# Patient Record
Sex: Female | Born: 1945
Health system: Southern US, Community
[De-identification: ages and names within clinical notes are randomized; demographics above are authoritative.]

## PROBLEM LIST (undated history)

## (undated) DIAGNOSIS — I428 Other cardiomyopathies: Secondary | ICD-10-CM

## (undated) DIAGNOSIS — G4730 Sleep apnea, unspecified: Secondary | ICD-10-CM

## (undated) DIAGNOSIS — K219 Gastro-esophageal reflux disease without esophagitis: Secondary | ICD-10-CM

## (undated) DIAGNOSIS — E1165 Type 2 diabetes mellitus with hyperglycemia: Secondary | ICD-10-CM

## (undated) DIAGNOSIS — I639 Cerebral infarction, unspecified: Secondary | ICD-10-CM

## (undated) DIAGNOSIS — I10 Essential (primary) hypertension: Secondary | ICD-10-CM

## (undated) DIAGNOSIS — I5022 Chronic systolic (congestive) heart failure: Secondary | ICD-10-CM

## (undated) DIAGNOSIS — IMO0001 Reserved for inherently not codable concepts without codable children: Secondary | ICD-10-CM

## (undated) DIAGNOSIS — Z87442 Personal history of urinary calculi: Secondary | ICD-10-CM

## (undated) DIAGNOSIS — R51 Headache: Secondary | ICD-10-CM

## (undated) HISTORY — DX: Essential (primary) hypertension: I10

## (undated) HISTORY — DX: Cerebral infarction, unspecified: I63.9

## (undated) HISTORY — DX: Personal history of urinary calculi: Z87.442

## (undated) HISTORY — PX: ABDOMINAL HYSTERECTOMY: SHX81

## (undated) HISTORY — DX: Headache: R51

## (undated) HISTORY — PX: OOPHORECTOMY: SHX86

## (undated) HISTORY — DX: Reserved for inherently not codable concepts without codable children: IMO0001

## (undated) HISTORY — DX: Chronic systolic (congestive) heart failure: I50.22

## (undated) HISTORY — DX: Type 2 diabetes mellitus with hyperglycemia: E11.65

## (undated) HISTORY — PX: CATARACT EXTRACTION: SUR2

## (undated) HISTORY — DX: Gastro-esophageal reflux disease without esophagitis: K21.9

## (undated) HISTORY — PX: OTHER SURGICAL HISTORY: SHX169

## (undated) HISTORY — DX: Other cardiomyopathies: I42.8

---

## 1998-07-13 ENCOUNTER — Emergency Department (HOSPITAL_COMMUNITY): Admission: EM | Admit: 1998-07-13 | Discharge: 1998-07-13 | Payer: Self-pay | Admitting: Emergency Medicine

## 1998-07-13 ENCOUNTER — Encounter: Payer: Self-pay | Admitting: Emergency Medicine

## 1999-04-14 ENCOUNTER — Ambulatory Visit (HOSPITAL_COMMUNITY): Admission: RE | Admit: 1999-04-14 | Discharge: 1999-04-14 | Payer: Self-pay | Admitting: Cardiology

## 2001-03-31 ENCOUNTER — Encounter: Payer: Self-pay | Admitting: *Deleted

## 2001-03-31 ENCOUNTER — Emergency Department (HOSPITAL_COMMUNITY): Admission: EM | Admit: 2001-03-31 | Discharge: 2001-03-31 | Payer: Self-pay | Admitting: *Deleted

## 2001-09-02 ENCOUNTER — Encounter: Payer: Self-pay | Admitting: Family Medicine

## 2001-09-02 ENCOUNTER — Ambulatory Visit (HOSPITAL_COMMUNITY): Admission: RE | Admit: 2001-09-02 | Discharge: 2001-09-02 | Payer: Self-pay | Admitting: Family Medicine

## 2001-10-03 ENCOUNTER — Ambulatory Visit (HOSPITAL_COMMUNITY): Admission: RE | Admit: 2001-10-03 | Discharge: 2001-10-03 | Payer: Self-pay | Admitting: Family Medicine

## 2001-10-03 ENCOUNTER — Encounter: Payer: Self-pay | Admitting: Family Medicine

## 2001-10-04 ENCOUNTER — Ambulatory Visit (HOSPITAL_COMMUNITY): Admission: RE | Admit: 2001-10-04 | Discharge: 2001-10-04 | Payer: Self-pay | Admitting: Urology

## 2001-10-04 ENCOUNTER — Encounter: Payer: Self-pay | Admitting: Urology

## 2001-11-06 ENCOUNTER — Encounter: Admission: RE | Admit: 2001-11-06 | Discharge: 2001-11-06 | Payer: Self-pay | Admitting: Urology

## 2001-11-06 ENCOUNTER — Encounter: Payer: Self-pay | Admitting: Urology

## 2002-06-05 ENCOUNTER — Ambulatory Visit (HOSPITAL_COMMUNITY): Admission: RE | Admit: 2002-06-05 | Discharge: 2002-06-05 | Payer: Self-pay | Admitting: Pulmonary Disease

## 2003-08-23 ENCOUNTER — Emergency Department (HOSPITAL_COMMUNITY): Admission: EM | Admit: 2003-08-23 | Discharge: 2003-08-23 | Payer: Self-pay | Admitting: Emergency Medicine

## 2003-09-17 ENCOUNTER — Ambulatory Visit (HOSPITAL_COMMUNITY): Admission: RE | Admit: 2003-09-17 | Discharge: 2003-09-17 | Payer: Self-pay | Admitting: Pulmonary Disease

## 2004-04-15 ENCOUNTER — Ambulatory Visit: Payer: Self-pay | Admitting: Internal Medicine

## 2004-04-26 ENCOUNTER — Ambulatory Visit: Payer: Self-pay | Admitting: Internal Medicine

## 2004-05-03 ENCOUNTER — Encounter: Admission: RE | Admit: 2004-05-03 | Discharge: 2004-05-03 | Payer: Self-pay | Admitting: Internal Medicine

## 2004-05-03 LAB — HM MAMMOGRAPHY: HM Mammogram: NORMAL

## 2004-05-06 ENCOUNTER — Ambulatory Visit (HOSPITAL_BASED_OUTPATIENT_CLINIC_OR_DEPARTMENT_OTHER): Admission: RE | Admit: 2004-05-06 | Discharge: 2004-05-06 | Payer: Self-pay | Admitting: Internal Medicine

## 2004-05-23 ENCOUNTER — Ambulatory Visit: Payer: Self-pay | Admitting: Internal Medicine

## 2004-05-25 ENCOUNTER — Ambulatory Visit: Payer: Self-pay

## 2004-05-27 ENCOUNTER — Ambulatory Visit: Payer: Self-pay | Admitting: Internal Medicine

## 2004-05-30 ENCOUNTER — Ambulatory Visit: Payer: Self-pay | Admitting: Internal Medicine

## 2004-05-31 ENCOUNTER — Ambulatory Visit: Payer: Self-pay | Admitting: Cardiology

## 2004-05-31 ENCOUNTER — Inpatient Hospital Stay (HOSPITAL_BASED_OUTPATIENT_CLINIC_OR_DEPARTMENT_OTHER): Admission: RE | Admit: 2004-05-31 | Discharge: 2004-05-31 | Payer: Self-pay | Admitting: Cardiology

## 2004-06-07 ENCOUNTER — Ambulatory Visit: Payer: Self-pay | Admitting: Internal Medicine

## 2004-06-13 ENCOUNTER — Ambulatory Visit: Payer: Self-pay | Admitting: Pulmonary Disease

## 2004-12-08 ENCOUNTER — Inpatient Hospital Stay (HOSPITAL_COMMUNITY): Admission: RE | Admit: 2004-12-08 | Discharge: 2004-12-12 | Payer: Self-pay | Admitting: Orthopaedic Surgery

## 2004-12-08 ENCOUNTER — Ambulatory Visit: Payer: Self-pay | Admitting: Physical Medicine & Rehabilitation

## 2005-01-09 ENCOUNTER — Ambulatory Visit: Payer: Self-pay | Admitting: Internal Medicine

## 2007-11-12 ENCOUNTER — Ambulatory Visit: Payer: Self-pay | Admitting: Internal Medicine

## 2007-11-12 ENCOUNTER — Telehealth: Payer: Self-pay | Admitting: Internal Medicine

## 2007-11-12 DIAGNOSIS — R519 Headache, unspecified: Secondary | ICD-10-CM | POA: Insufficient documentation

## 2007-11-12 DIAGNOSIS — Z87442 Personal history of urinary calculi: Secondary | ICD-10-CM | POA: Insufficient documentation

## 2007-11-12 DIAGNOSIS — K219 Gastro-esophageal reflux disease without esophagitis: Secondary | ICD-10-CM | POA: Insufficient documentation

## 2007-11-12 DIAGNOSIS — I10 Essential (primary) hypertension: Secondary | ICD-10-CM | POA: Insufficient documentation

## 2007-11-12 DIAGNOSIS — R51 Headache: Secondary | ICD-10-CM | POA: Insufficient documentation

## 2007-11-12 DIAGNOSIS — J45909 Unspecified asthma, uncomplicated: Secondary | ICD-10-CM | POA: Insufficient documentation

## 2010-02-28 ENCOUNTER — Ambulatory Visit: Payer: Self-pay | Admitting: Internal Medicine

## 2010-04-24 ENCOUNTER — Inpatient Hospital Stay (HOSPITAL_COMMUNITY)
Admission: EM | Admit: 2010-04-24 | Discharge: 2010-04-25 | Payer: Self-pay | Source: Home / Self Care | Attending: Internal Medicine | Admitting: Internal Medicine

## 2010-04-25 ENCOUNTER — Encounter: Payer: Self-pay | Admitting: Internal Medicine

## 2010-04-25 LAB — DIFFERENTIAL
Basophils Absolute: 0 10*3/uL (ref 0.0–0.1)
Basophils Relative: 0 % (ref 0–1)
Eosinophils Absolute: 0.2 10*3/uL (ref 0.0–0.7)
Eosinophils Relative: 2 % (ref 0–5)
Lymphocytes Relative: 31 % (ref 12–46)
Lymphs Abs: 3.1 10*3/uL (ref 0.7–4.0)
Monocytes Absolute: 1 10*3/uL (ref 0.1–1.0)
Monocytes Relative: 10 % (ref 3–12)
Neutro Abs: 5.7 10*3/uL (ref 1.7–7.7)
Neutrophils Relative %: 57 % (ref 43–77)

## 2010-04-25 LAB — URINE MICROSCOPIC-ADD ON

## 2010-04-25 LAB — BASIC METABOLIC PANEL
BUN: 10 mg/dL (ref 6–23)
CO2: 27 mEq/L (ref 19–32)
Calcium: 9.4 mg/dL (ref 8.4–10.5)
Chloride: 104 mEq/L (ref 96–112)
Creatinine, Ser: 0.75 mg/dL (ref 0.4–1.2)
GFR calc Af Amer: >60 mL/min (ref >60)
GFR calc non Af Amer: >60 mL/min (ref >60)
Glucose, Bld: 156 mg/dL — ABNORMAL HIGH (ref 70–99)
Potassium: 3.2 mEq/L — ABNORMAL LOW (ref 3.5–5.1)
Sodium: 139 mEq/L (ref 135–145)

## 2010-04-25 LAB — CBC
HCT: 41.4 % (ref 36.0–46.0)
Hemoglobin: 14.3 g/dL (ref 12.0–15.0)
MCH: 30.2 pg (ref 26.0–34.0)
MCHC: 34.5 g/dL (ref 30.0–36.0)
MCV: 87.3 fL (ref 78.0–100.0)
Platelets: 225 10*3/uL (ref 150–400)
RBC: 4.74 MIL/uL (ref 3.87–5.11)
RDW: 12.9 % (ref 11.5–15.5)
WBC: 10.1 10*3/uL (ref 4.0–10.5)

## 2010-04-25 LAB — URINALYSIS, ROUTINE W REFLEX MICROSCOPIC
Bilirubin Urine: NEGATIVE
Ketones, ur: NEGATIVE mg/dL
Nitrite: NEGATIVE
Protein, ur: 30 mg/dL — AB
Specific Gravity, Urine: 1.02 (ref 1.005–1.030)
Urine Glucose, Fasting: 100 mg/dL — AB
Urobilinogen, UA: 0.2 mg/dL (ref 0.0–1.0)
pH: 6 (ref 5.0–8.0)

## 2010-04-27 LAB — COMPREHENSIVE METABOLIC PANEL
ALT: 24 U/L (ref 0–35)
AST: 25 U/L (ref 0–37)
Albumin: 3.3 g/dL — ABNORMAL LOW (ref 3.5–5.2)
Alkaline Phosphatase: 54 U/L (ref 39–117)
BUN: 9 mg/dL (ref 6–23)
CO2: 26 mEq/L (ref 19–32)
Calcium: 8.7 mg/dL (ref 8.4–10.5)
Chloride: 103 mEq/L (ref 96–112)
Creatinine, Ser: 0.81 mg/dL (ref 0.4–1.2)
GFR calc Af Amer: >60 mL/min (ref >60)
GFR calc non Af Amer: >60 mL/min (ref >60)
Glucose, Bld: 178 mg/dL — ABNORMAL HIGH (ref 70–99)
Potassium: 3.3 mEq/L — ABNORMAL LOW (ref 3.5–5.1)
Sodium: 136 mEq/L (ref 135–145)
Total Bilirubin: 0.6 mg/dL (ref 0.3–1.2)
Total Protein: 5.6 g/dL — ABNORMAL LOW (ref 6.0–8.3)

## 2010-04-27 LAB — CBC
HCT: 38.8 % (ref 36.0–46.0)
Hemoglobin: 13.1 g/dL (ref 12.0–15.0)
MCH: 29.7 pg (ref 26.0–34.0)
MCHC: 33.8 g/dL (ref 30.0–36.0)
MCV: 88 fL (ref 78.0–100.0)
Platelets: 213 10*3/uL (ref 150–400)
RBC: 4.41 MIL/uL (ref 3.87–5.11)
RDW: 13 % (ref 11.5–15.5)
WBC: 9.4 10*3/uL (ref 4.0–10.5)

## 2010-04-27 LAB — LIPID PANEL
Cholesterol: 171 mg/dL (ref 0–200)
HDL: 33 mg/dL — ABNORMAL LOW (ref >39)
LDL Cholesterol: 83 mg/dL (ref 0–99)
Total CHOL/HDL Ratio: 5.2 RATIO
Triglycerides: 277 mg/dL — ABNORMAL HIGH (ref <150)
VLDL: 55 mg/dL — ABNORMAL HIGH (ref 0–40)

## 2010-04-27 LAB — CARDIAC PANEL(CRET KIN+CKTOT+MB+TROPI)
CK, MB: 1.2 ng/mL (ref 0.3–4.0)
CK, MB: 1.1 ng/mL (ref 0.3–4.0)
Relative Index: INVALID (ref 0.0–2.5)
Relative Index: INVALID (ref 0.0–2.5)
Total CK: 52 U/L (ref 7–177)
Total CK: 52 U/L (ref 7–177)
Troponin I: 0.01 ng/mL (ref 0.00–0.06)
Troponin I: 0.01 ng/mL (ref 0.00–0.06)

## 2010-04-27 LAB — HEMOGLOBIN A1C
Hgb A1c MFr Bld: 7.7 % — ABNORMAL HIGH (ref <5.7)
Mean Plasma Glucose: 174 mg/dL — ABNORMAL HIGH (ref <117)

## 2010-04-27 LAB — URINE CULTURE
Colony Count: >=100000
Culture  Setup Time: 201201160330

## 2010-04-27 LAB — HEPATIC FUNCTION PANEL
ALT: 23 U/L (ref 0–35)
AST: 24 U/L (ref 0–37)
Albumin: 3.5 g/dL (ref 3.5–5.2)
Alkaline Phosphatase: 55 U/L (ref 39–117)
Bilirubin, Direct: 0.1 mg/dL (ref 0.0–0.3)
Indirect Bilirubin: 0.7 mg/dL (ref 0.3–0.9)
Total Bilirubin: 0.8 mg/dL (ref 0.3–1.2)
Total Protein: 5.7 g/dL — ABNORMAL LOW (ref 6.0–8.3)

## 2010-04-27 LAB — GLUCOSE, CAPILLARY
Glucose-Capillary: 163 mg/dL — ABNORMAL HIGH (ref 70–99)
Glucose-Capillary: 297 mg/dL — ABNORMAL HIGH (ref 70–99)
Glucose-Capillary: 171 mg/dL — ABNORMAL HIGH (ref 70–99)

## 2010-04-27 LAB — CK TOTAL AND CKMB (NOT AT ARMC)
CK, MB: 1.4 ng/mL (ref 0.3–4.0)
Relative Index: INVALID (ref 0.0–2.5)
Total CK: 75 U/L (ref 7–177)

## 2010-04-27 LAB — LIPASE, BLOOD: Lipase: 20 U/L (ref 11–59)

## 2010-04-27 LAB — MAGNESIUM: Magnesium: 1.9 mg/dL (ref 1.5–2.5)

## 2010-04-27 LAB — TSH: TSH: 2.855 u[IU]/mL (ref 0.350–4.500)

## 2010-04-27 LAB — TROPONIN I: Troponin I: 0.04 ng/mL (ref 0.00–0.06)

## 2010-05-05 ENCOUNTER — Ambulatory Visit
Admission: RE | Admit: 2010-05-05 | Discharge: 2010-05-05 | Payer: Self-pay | Source: Home / Self Care | Attending: Internal Medicine | Admitting: Internal Medicine

## 2010-05-05 DIAGNOSIS — E1165 Type 2 diabetes mellitus with hyperglycemia: Secondary | ICD-10-CM | POA: Insufficient documentation

## 2010-05-05 DIAGNOSIS — IMO0002 Reserved for concepts with insufficient information to code with codable children: Secondary | ICD-10-CM | POA: Insufficient documentation

## 2010-05-05 DIAGNOSIS — G459 Transient cerebral ischemic attack, unspecified: Secondary | ICD-10-CM | POA: Insufficient documentation

## 2010-05-05 DIAGNOSIS — E119 Type 2 diabetes mellitus without complications: Secondary | ICD-10-CM | POA: Insufficient documentation

## 2010-05-05 DIAGNOSIS — E1169 Type 2 diabetes mellitus with other specified complication: Secondary | ICD-10-CM | POA: Insufficient documentation

## 2010-05-10 NOTE — Progress Notes (Signed)
Summary: Apt Today  Phone Note Call from Patient   Summary of Call: Pt c/o fever, body aches, sinus congestion, productive cough which began sunday. Has taken excedrin only. Daughter has also has had a fever & cough. Pt put on schedule for 1:50, flu procedures? Pt has lost her job & aware of self pay fee. Last seen 2006 Initial call taken by: Lamar Sprinkles,  November 12, 2007 9:07 AM  Follow-up for Phone Call        Yes...Marland Kitchenflu procedures. Follow-up by: Jacques Navy MD,  November 12, 2007 12:50 PM  Additional Follow-up for Phone Call Additional follow up Details #1::        Was unable to reach pt in time, Shanda Bumps took pt out of waiting room & gave mask Additional Follow-up by: Lamar Sprinkles,  November 12, 2007 1:47 PM

## 2010-05-10 NOTE — Miscellaneous (Signed)
Summary: Procedure consent  Procedure consent   Imported By: Lester Lamar 03/04/2010 08:42:55  _____________________________________________________________________  External Attachment:    Type:   Image     Comment:   External Document

## 2010-05-10 NOTE — Assessment & Plan Note (Signed)
Summary: evaluation for mole on back   Vital Signs:  Patient profile:   65 year old female Height:      64 inches Weight:      195 pounds BMI:     33.59 O2 Sat:      95 % on Room air Temp:     98.7 degrees F oral Pulse rate:   99 / minute BP sitting:   162 / 100  (left arm) Cuff size:   large  Vitals Entered By: Bill Salinas CMA (February 28, 2010 2:51 PM)  O2 Flow:  Room air CC: pt here for evaluation of several moles. She is mostly concerned with one in particular on her back/ ab   Primary Care Provider:  Jacques Navy MD  CC:  pt here for evaluation of several moles. She is mostly concerned with one in particular on her back/ ab.  History of Present Illness: Patient presents with a concern about a mole on her back. This has been present for some time but she thinks it may be getting larger. It does itch. It is located such that her brassier irritates the lesion.  Current Medications (verified): 1)  Proair Hfa 108 (90 Base) Mcg/act  Aers (Albuterol Sulfate) .... As Needed 2)  Lisinopril-Hydrochlorothiazide 20-12.5 Mg  Tabs (Lisinopril-Hydrochlorothiazide) .Marland Kitchen.. 1 By Mouth Once Daily  Allergies (verified): No Known Drug Allergies  Past History:  Past Medical History: Last updated: 11/12/2007 UCD x/ mumps childhood and adult asthma GERD Headache Hypertension Nephrolithiasis, hx of x 2 UTIs  Past Surgical History: Last updated: 11/12/2007 Oophorectomy-unilateral Hysterectomy ureteral extraction of kidney stone.   G2P2 FH reviewed for relevance, SH/Risk Factors reviewed for relevance  Review of Systems Derm:  Complains of itching and lesion(s); denies changes in color of skin, dryness, insect bite(s), and rash.  Physical Exam  General:  overweight white female in no distress. Eyes:  C&S clear Lungs:  normal respiratory effort.   Heart:  normal rate and regular rhythm.   Skin:  dark, raised lesion right of center mid back at level of  brassier.   Impression & Recommendations:  Problem # 1:  NEOPLASM OF UNCERTAIN BEHAVIOR OF SKIN (ICD-238.2)  shave biopsy of lesion of uncertain behavior that on removal was most c/w seborrheic keratosis.  Orders: Shave Skin Lesion 1.1-2.0 cm/trunk/arm/leg (16109)  Complete Medication List: 1)  Proair Hfa 108 (90 Base) Mcg/act Aers (Albuterol sulfate) .... As needed 2)  Lisinopril-hydrochlorothiazide 20-12.5 Mg Tabs (Lisinopril-hydrochlorothiazide) .Marland Kitchen.. 1 by mouth once daily   Orders Added: 1)  Est. Patient Level II [60454] 2)  Shave Skin Lesion 1.1-2.0 cm/trunk/arm/leg [09811]     Procedure Note  Mole Biopsy/Removal: The patient complains of irritation and changing mole. Indication: changing lesion  Procedure # 1: shave biopsy    Size (in cm): 2.0 x 2.0    Location: right back    Comment: lesion of uncertain behavior but most likely a growing seborrheic keratosis    Instrument used: derma blade    Anesthesia: 2% xylocain with epi    Closure: hyfrecator  Cleaned and prepped with: betadine Wound dressing: bandaid Additional Instructions: routine wound precautions.

## 2010-05-10 NOTE — Assessment & Plan Note (Signed)
Summary: fever/cough/bodyaches SD   Vital Signs:  Patient Profile:   65 Years Old Female Height:     64 inches Weight:      190 pounds BMI:     32.73 Temp:     99.3 degrees F oral Pulse rate:   106 / minute BP sitting:   178 / 100  (left arm) Cuff size:   large  Vitals Entered By: Zackery Barefoot CMA (November 12, 2007 1:53 PM)                 PCP:  Jacques Navy MD  Chief Complaint:  [name] nose/bodyaches/productive cough/cold sweats/S.O.B. x 3 days.  History of Present Illness: Started feeling bad Saturday night with cold symptoms. Sunday the symptoms got much worse with cough, chills, [place] with one episode of emesis, myalgia, felt feverish (no documented fever). [name] (daughter) was also sick but did not have muscle pain. Feeling a little better today. Has trouble breathing due to cough with deep inspiration.    Updated Prior Medication List: PROAIR HFA 108 (90 BASE) MCG/ACT  AERS (ALBUTEROL SULFATE) as needed  Current Allergies: No known allergies   Past Medical History:    UCD x/ mumps    childhood and adult asthma    GERD    Headache    Hypertension    Nephrolithiasis, hx of x 2    UTIs  Past Surgical History:    Oophorectomy-unilateral    Hysterectomy    ureteral extraction of kidney stone.            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    Lives alone -struggle $$   Risk Factors:  Tobacco use:  never Caffeine use:  0 drinks per day Alcohol use:  yes    Type:  wine, mixed drink    Drinks per day:  <1    Counseled to quit/cut down alcohol use:  no Exercise:  no Seatbelt use:  100 %  Mammogram History:     Date of Last Mammogram:  05/03/2004    Results:  Normal Bilateral    Review of Systems   The patient complains of headaches.  The patient denies anorexia, weight loss, weight gain, vision loss, decreased hearing, chest pain, syncope, dyspnea on exertion, peripheral edema, abdominal pain, severe indigestion/heartburn, muscle weakness, suspicious skin lesions, difficulty walking, abnormal bleeding, angioedema, and breast masses.     Physical Exam  General:     alert, well-developed, well-nourished, well-hydrated, and normal appearance.   Head:     normocephalic and no abnormalities observed.   Eyes:     pupils equal, pupils round, and corneas and lenses clear.   Ears:     R ear normal and L ear normal.   Nose:     no external deformity.   Mouth:     no oral lesions Neck:     supple, no masses, and no thyromegaly.   Lungs:     normal respiratory effort.  Some crackles, no wheezing, no increased work of breathing Heart:     normal rate and regular rhythm.   Abdomen:     obese Msk:  normal ROM, no joint tenderness, no joint swelling, no joint instability, and no crepitation.   Pulses:     R radial normal and R femoral normal.   Extremities:     trace left pedal edema.   Neurologic:     cranial nerves II-XII intact and gait normal.   Skin:     turgor normal, color normal, and no rashes.   Cervical Nodes:     no anterior cervical adenopathy and no posterior cervical adenopathy.   Psych:     Oriented X3, memory intact for recent and remote, normally interactive, and good eye contact.      Impression & Recommendations:  Problem # 1:  BRONCHITIS-ACUTE (ICD-466.0) patient with productive cough, wheezing and low grade fever. She also has myalgia. She may have H1N1 influenza. discussed with the patient: she is not in a high risk group, she is not having serious respiratory symptoms or high fever. She has been ill for two day.s  Plan Septra DS two times a day x 7 days; ProAir inhaler 2 puffs 4 times a day as needed; APAP; Ibuprofen. I do not think that tamiflu will  have significant value at this point. She is to watch for persistent fevers, or increased respiratory symptoms not relieved by treatment of broncitis. Should she develop these symptoms she is to call for an Rx for Tamiflu.  Her updated medication list for this problem includes:    Proair Hfa 108 (90 Base) Mcg/act Aers (Albuterol sulfate) .Marland Kitchen... As needed    Sulfamethoxazole-tmp Ds 800-160 Mg Tabs (Sulfamethoxazole-trimethoprim) .Marland Kitchen... 1 by mouth two times a day x 7 days for  bronchitis   Problem # 2:  HYPERTENSION (ICD-401.9) Resume treatment  Plan: lisinopril/hct 20/12.5 at [facility]. Her updated medication list for this problem includes:    Lisinopril-hydrochlorothiazide 20-12.5 Mg Tabs (Lisinopril-hydrochlorothiazide) .Marland Kitchen... 1 by mouth once daily   Problem # 3:  ASTHMA (ICD-493.90) She does have intermittent symptoms which she treats with combivent.  Plan: ProAir as needed.  Her updated medication list for this problem includes:    Proair Hfa 108 (90 Base) Mcg/act Aers (Albuterol sulfate) .Marland Kitchen... As needed   Problem # 4:  Preventive Health Care (ICD-V70.0) Needs mammogram - she is to check with [facility] department. Needs routine lab when she can afford it.   Complete Medication List: 1)  Proair Hfa 108 (90 Base) Mcg/act Aers (Albuterol sulfate) .... As needed 2)  Sulfamethoxazole-tmp Ds 800-160 Mg Tabs (Sulfamethoxazole-trimethoprim) .Marland Kitchen.. 1 by mouth two times a day x 7 days for  bronchitis 3)  Lisinopril-hydrochlorothiazide 20-12.5 Mg Tabs (Lisinopril-hydrochlorothiazide) .Marland Kitchen.. 1 by mouth once daily    Prescriptions: PROAIR HFA 108 (90 BASE) MCG/ACT  AERS (ALBUTEROL SULFATE) as needed  #1 x 3   Entered and Authorized by:   Jacques Navy MD   Signed by:   Jacques Navy MD on 11/12/2007   Method used:   Electronically sent to ...       [facility]  [address]       [phone]       [place], [place]  [address]       Ph: [phone]        Fax: [phone]   RxID:   [id number] LISINOPRIL-HYDROCHLOROTHIAZIDE 20-12.5 MG  TABS (LISINOPRIL-HYDROCHLOROTHIAZIDE) 1 by mouth once daily  #90 x 3   Entered and Authorized by:   Jacques Navy MD   Signed by:  Jacques Navy MD on 11/12/2007   Method used:   Electronically sent to ...       [facility]  [address]       [phone]       [place], [place]  [address]       Ph: [phone]       Fax: [phone]   RxID:   [id number] SULFAMETHOXAZOLE-TMP DS 800-160 MG  TABS (SULFAMETHOXAZOLE-TRIMETHOPRIM) 1 by mouth two times a day x 7 days for  bronchitis  #14 x 0   Entered and Authorized by:   Jacques Navy MD   Signed by:   Jacques Navy MD on 11/12/2007   Method used:   Electronically sent to ...       [facility]  [address]       [place], [place]  [address]       Ph: [phone]       Fax: [phone]   RxID:   [id number]  ]

## 2010-05-17 NOTE — Discharge Summary (Signed)
[name], [name]              ACCOUNT NO.:  [id number]  MEDICAL RECORD NO.:  [id number]          PATIENT TYPE:  INP  LOCATION:  2037                         FACILITY:  [facility]  PHYSICIAN:  Rosalyn Gess. Korra Christine, MD  DATE OF BIRTH:  May 12, 1945  DATE OF ADMISSION:  04/24/2010 DATE OF DISCHARGE:  04/25/2010                              DISCHARGE SUMMARY   REASON FOR ADMISSION:  [name] was admitted for dizziness and mental status change.  DISCHARGE DIAGNOSES: 1. Hypertension, now under control. 2. Mental status change, resolved.  CONSULTATIONS:  None.  PROCEDURES: 1. CT scan of the brain was performed on April 24, 2010 which showed     no intracranial hemorrhage or CT evidence of large acute infarct. 2. Chest x-ray, April 25, 2010, which showed stable, no     cardiopulmonary process. 3. MRI of the brain with preliminary reading of left maxillary dental     hardware artifact, no acute intracranial abnormality.  Incidental     left temporal lobe development of venous anomaly. 4. 2-D echo performed on April 25, 2010, the results are pending at     time of discharge dictation.  HISTORY OF PRESENT ILLNESS:  The patient is a 65 year old woman with known history of hypertension who stopped taking her lisinopril because of a bout of nausea and diarrhea over the last week.  The patient reports she had been having loose stools at least 2-3 times a day, but on the date of admission had not had any stooling for 2 days.  She has been having nausea not related to food, but when she eats it does increase her nausea.  She has not been taking her blood pressure medication because of her symptoms.  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 She became confused and not herself.  She went to the second service and at that time the confusion was worse and she had difficulty ambulating and was taken home by her church  friends.  While at the church she knows a local doctor who checks her at home and found her blood pressure was increasing.  She was advised to take an additional dose of lisinopril which she did take but had persistently elevated blood pressure.  She was subsequently brought to the hospital about 9:00 p.m.  The patient had an initial CT scan that was normal.  Presenting blood pressure was 202/94 which came down with IV labetalol and lisinopril.  The patient did complain of some mild chest pain intermittently during the day lasting only for a few seconds but it was pressure-like in nature with radiation to her jaw.  The patient was admitted for blood pressure observation, rule out MI, and to evaluate her near syncopal episodes.  Please see the H and P for past medical history, family history, and social history and admission physical exam.  HOSPITAL COURSE: 1. Hypertension.  The patient's blood was well-controlled with     resumption of her medication and on the day of discharge in the     a.m. her blood pressure was  134/82 and in the p.m. it was 112/75.     She reports that her confusion had cleared and that all of her     symptoms that brought her into the hospital had cleared. 2. Cardiovascular.  The patient had complained of intermittent chest     pain.  She had multiple cardiac enzymes.  The first CK was 75 with     a troponin of 0.04.  Second CK was 52 with a troponin of 0.01.     Third with a CK 52 and troponin of 0.01 effectively having ruled     out for MI with a normal EKG. 3. GI.  The patient with probable viral gastroenteritis.  She reports     she has had no diarrhea for several days.  She does have some mild     problems with dyspepsia.  The patient was not any medication for     dyspepsia at admission.  The patient did take a full dinner tonight     without complications or problems.  Plan:  The patient is to take     over-the-counter H2 blocker such as Pepcid or Tagamet  as needed. 4. Neuro.  The patient had a nonfocal examination at admission.  MRI     was unremarkable as noted.  Discharge examination revealed a normal     neuro exam.  DISCHARGE EXAMINATION:  VITAL SIGNS:  At 1900 hours, temperature was 97.8, blood pressure was 112/75, pulse was 95, respirations 20, and O2 sats 96% on room air. GENERAL APPEARANCE:  This is an overweight woman lying in bed in no acute distress. CHEST:  The patient is moving air well with no rales, wheezes, or rhonchi. CARDIOVASCULAR:  2+ radial pulse.  Precordium was quiet.  She had a regular rate and rhythm. ABDOMEN:  Nontender. NEURO:  The patient is awake and alert.  She is oriented to person, place, time, and context.  Cranial nerves II-XII were intact with normal facial symmetry and muscle movement.  Extraocular muscles were intact. Pupils equal, round, and reactive.  Motor strength was 5/5 throughout. Cerebellar:  The patient had no tremor.  No cogwheeling.  FINAL LABORATORY:  In addition to cardiac enzymes as noted above, TSH was normal at 2.855.  Hemoglobin A1c was mildly elevated 7.7% and will need followup in the office.  Magnesium was normal at 1.9.  Lipid profile with a cholesterol of 171, triglycerides 277, HDL was 33, and LDL was 83.  Lipase was normal at 20.  Urinalysis was significant for moderate leukocyte, large blood.  Microscopic exam with 7-10 wbc's per high-powered field and rare bacteria.  Metabolic panel on April 25, 2010 with sodium 136, potassium 3.3, chloride 103, CO2 of 26, BUN of 9, creatinine 0.8, glucose of 178, SGOT 25, SGPT 24, and total protein was 5.6.  DISPOSITION:  The patient is discharged home.  She will resume her prior medications including lisinopril 20 mg daily and aspirin 81 mg daily. The patient is to follow a no-sugar, low-carb diet.  The patient will be seen in the office in 7-10 days for followup, specifically to address her elevated blood sugar and elevated  A1c.  The patient's condition at time of discharge dictation is stable and improved.     Rosalyn Gess Jaimee Corum, MD     MEN/MEDQ  D:  04/25/2010  T:  04/26/2010  Job:  [id number]  Electronically Signed by Illene Regulus MD on 05/17/2010 07:23:38 AM

## 2010-05-18 NOTE — Assessment & Plan Note (Signed)
Summary: post hosp   Vital Signs:  Patient profile:   65 year old female Height:      64 inches Weight:      198 pounds BMI:     34.11 O2 Sat:      97 % on Room air Temp:     98.0 degrees F oral Pulse rate:   88 / minute BP sitting:   138 / 88  (left arm) Cuff size:   large  Vitals Entered By: Bill Salinas CMA (May 05, 2010 4:03 PM)  O2 Flow:  Room air CC: pt here for hosp f/u / ab   Primary Care Provider:  Jacques Navy MD  CC:  pt here for hosp f/u / ab.  History of Present Illness: Patient presents for BP follow -up and for hospital follow-up. She did have a 24 eval admission for transient neurolgic symptoms. No records availabe, i.e. H&P or D/C summary, in eChart. Reviewed labs and studies: nl CT brain, nl MRI/MRA brain, A1C 7.7%, lipids 171/33/83, TSH 2.8, Cmet normal, CBC normal, cardiac enzymes normal. She has not had any recurrent symptoms. At d/c she was started on ASA 325mg  daily.   Her labs were shared with her and she is informed that based on serum glucose levels and on A1C she is diabetic which is a new diagnosis for her.   Current Medications (verified): 1)  Proair Hfa 108 (90 Base) Mcg/act  Aers (Albuterol Sulfate) .... As Needed 2)  Lisinopril-Hydrochlorothiazide 20-12.5 Mg  Tabs (Lisinopril-Hydrochlorothiazide) .Marland Kitchen.. 1 By Mouth Once Daily 3)  Ciprofloxacin Hcl 250 Mg Tabs (Ciprofloxacin Hcl) .Marland Kitchen.. 1 Tablet Two Times A Day  Allergies (verified): No Known Drug Allergies  Past History:  Past Surgical History: Last updated: 06-Dec-2007 Oophorectomy-unilateral Hysterectomy ureteral extraction of kidney stone.   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 Lives alone -struggle $$  Past Medical  History: UCD x/ mumps DIAB W/O MENTION COMP TYPE II/UNS TYPE UNCNTRL (ICD-250.02) ASTHMA (ICD-493.90) NEPHROLITHIASIS, HX OF (ICD-V13.01) HYPERTENSION (ICD-401.9) HEADACHE (ICD-784.0) GERD (ICD-530.81)  Review of Systems       The patient complains of weight loss.  The patient denies anorexia, fever, vision loss, decreased hearing, chest pain, syncope, dyspnea on exertion, prolonged cough, hemoptysis, abdominal pain, severe indigestion/heartburn, incontinence, muscle weakness, difficulty walking, depression, abnormal bleeding, and enlarged lymph nodes.         increased appetite and thirst, some blurred vision, generalized weakness and malaise.   Physical Exam  General:  alert, well-developed, well-nourished, and normal appearance.   Head:  normocephalic and atraumatic.   Eyes:  pupils equal, pupils round, corneas and lenses clear, and no injection.   Neck:  supple.   Lungs:  normal respiratory effort and normal breath sounds.   Heart:  normal rate and regular rhythm.   Abdomen:  soft and normal bowel sounds.   Msk:  normal ROM.   Pulses:  2+ radial Neurologic:  alert & oriented X3, cranial nerves II-XII intact, and gait normal.   Skin:  turgor normal and color normal.   Psych:  Oriented X3, memory intact for recent and remote, normally interactive, and good eye contact.     Impression & Recommendations:  Problem # 1:  DIAB W/O MENTION COMP TYPE II/UNS TYPE UNCNTRL (ICD-250.02) New  diagnosis of diabetes. Reviewed the mechanism of disease with her. Discussed treatment options and that she will start with life-style managemnt: no sugar diet, reduced simple carbohydrates and exercise. Referred to [email or web address] for additional information. If after reviewing on line materials she needs help will refer to diabetes educator.  Plan - life-style management           f/u A1C in 3 months.   Her updated medication list for this problem includes:    Lisinopril-hydrochlorothiazide 20-12.5  Mg Tabs (Lisinopril-hydrochlorothiazide) .Marland Kitchen... 1 by mouth once daily  Problem # 2:  HYPERTENSION (ICD-401.9)  Her updated medication list for this problem includes:    Lisinopril-hydrochlorothiazide 20-12.5 Mg Tabs (Lisinopril-hydrochlorothiazide) .Marland Kitchen... 1 by mouth once daily  BP today: 138/88 Prior BP: 162/100 (02/28/2010)  Much better control. Labs in hospital with normal renal function and lytes.  Plan - continue present medications.   Problem # 3:  TRANSIENT ISCHEMIC ATTACK (ICD-435.9) 24 hr observation admission for TIA - all studies negative.  Plan - risk modification, i.e. controll of BP. LDL is at goal           ASA 325mg  daily.  Complete Medication List: 1)  Proair Hfa 108 (90 Base) Mcg/act Aers (Albuterol sulfate) .... As needed 2)  Lisinopril-hydrochlorothiazide 20-12.5 Mg Tabs (Lisinopril-hydrochlorothiazide) .Marland Kitchen.. 1 by mouth once daily 3)  Ciprofloxacin Hcl 250 Mg Tabs (Ciprofloxacin hcl) .Marland Kitchen.. 1 tablet two times a day 4)  Prevacid 24hr 15 Mg Cpdr (Lansoprazole) .Marland Kitchen.. 1 by mouth qam   Orders Added: 1)  Est. Patient Level IV [84696]   Immunization History:  Pneumovax Immunization History:    Pneumovax:  historical (04/25/2010)   Immunization History:  Pneumovax Immunization History:    Pneumovax:  Historical (04/25/2010)

## 2010-06-06 ENCOUNTER — Ambulatory Visit (INDEPENDENT_AMBULATORY_CARE_PROVIDER_SITE_OTHER): Payer: Medicare Other | Admitting: Internal Medicine

## 2010-06-06 ENCOUNTER — Encounter: Payer: Self-pay | Admitting: Internal Medicine

## 2010-06-06 DIAGNOSIS — IMO0001 Reserved for inherently not codable concepts without codable children: Secondary | ICD-10-CM

## 2010-06-06 DIAGNOSIS — I10 Essential (primary) hypertension: Secondary | ICD-10-CM

## 2010-06-16 NOTE — Assessment & Plan Note (Signed)
Summary: one mth fu-lb   Vital Signs:  Patient profile:   65 year old female Height:      64 inches Weight:      187 pounds BMI:     32.21 O2 Sat:      97 % on Room air Temp:     98.8 degrees F oral Pulse rate:   76 / minute BP sitting:   142 / 80  (left arm) Cuff size:   large  Vitals Entered By: Bill Salinas CMA (June 06, 2010 2:29 PM)  O2 Flow:  Room air CC: follow-up visit/ ab   Primary Care Provider:  Jacques Navy MD  CC:  follow-up visit/ ab.  History of Present Illness: [name] was seen post-hospital Jan 26th: at that visit her diagnosis of hypertension, hyperlipidemia and diabetes were reviewed. Her choice was to try life-style management of her lipid and glucose disorders.  In the interval since her visit she has  gone to  [email or web address] and read a lot about diabetes; she has changed her diet and has lost several pounds.   Current Medications (verified): 1)  Proair Hfa 108 (90 Base) Mcg/act  Aers (Albuterol Sulfate) .... As Needed 2)  Lisinopril-Hydrochlorothiazide 20-12.5 Mg  Tabs (Lisinopril-Hydrochlorothiazide) .Marland Kitchen.. 1 By Mouth Once Daily 3)  Ciprofloxacin Hcl 250 Mg Tabs (Ciprofloxacin Hcl) .Marland Kitchen.. 1 Tablet Two Times A Day 4)  Prevacid 24hr 15 Mg Cpdr (Lansoprazole) .Marland Kitchen.. 1 By Mouth Qam  Allergies (verified): No Known Drug Allergies PMH-FH-SH reviewed-no changes except otherwise noted  Review of Systems       The patient complains of weight loss.  The patient denies vision loss, chest pain, dyspnea on exertion, abdominal pain, muscle weakness, and depression.    Physical Exam  General:  overweight white woman in no distress Head:  no abnormalities observed.   Eyes:  vision grossly intact, pupils equal, and pupils round.   Neck:  supple.   Lungs:  normal respiratory effort.   Heart:  normal rate and regular rhythm.   Pulses:  2+ radial Neurologic:  alert & oriented X3, cranial nerves II-XII intact, and gait normal.   Skin:  turgor normal and  color normal.   Psych:  Oriented X3, memory intact for recent and remote, and normally interactive.     Impression & Recommendations:  Problem # 1:  DIAB W/O MENTION COMP TYPE II/UNS TYPE UNCNTRL (ICD-250.02) [name] is working hard on improving her diet and controlling her diabetes.  Plan - she will return for lab at the 3 month mark for an A1C  Her updated medication list for this problem includes:    Lisinopril-hydrochlorothiazide 20-12.5 Mg Tabs (Lisinopril-hydrochlorothiazide) .Marland Kitchen... 1 by mouth once daily  Problem # 2:  HYPERTENSION (ICD-401.9)  Her updated medication list for this problem includes:    Lisinopril-hydrochlorothiazide 20-12.5 Mg Tabs (Lisinopril-hydrochlorothiazide) .Marland Kitchen... 1 by mouth once daily  BP today: 142/80 Prior BP: 138/88 (05/05/2010)  Suboptimal control. Wll continue present medications and as she looses weight this should improve  Complete Medication List: 1)  Proair Hfa 108 (90 Base) Mcg/act Aers (Albuterol sulfate) .... As needed 2)  Lisinopril-hydrochlorothiazide 20-12.5 Mg Tabs (Lisinopril-hydrochlorothiazide) .Marland Kitchen.. 1 by mouth once daily 3)  Ciprofloxacin Hcl 250 Mg Tabs (Ciprofloxacin hcl) .Marland Kitchen.. 1 tablet two times a day 4)  Prevacid 24hr 15 Mg Cpdr (Lansoprazole) .Marland Kitchen.. 1 by mouth qam   Orders Added: 1)  Est. Patient Level II [44010]

## 2010-07-25 ENCOUNTER — Other Ambulatory Visit: Payer: Medicare Other

## 2010-07-25 ENCOUNTER — Other Ambulatory Visit: Payer: Self-pay | Admitting: Internal Medicine

## 2010-07-25 DIAGNOSIS — E119 Type 2 diabetes mellitus without complications: Secondary | ICD-10-CM

## 2010-08-26 NOTE — Cardiovascular Report (Signed)
NAME:  [name], [name]              ACCOUNT NO.:  [id number]   MEDICAL RECORD NO.:  [id number]          PATIENT TYPE:  OIB   LOCATION:  6501                         FACILITY:  [facility]   PHYSICIAN:  Charlies Constable, M.D. [facility] DATE OF BIRTH:  31-Dec-1945   DATE OF PROCEDURE:  DATE OF DISCHARGE:                              CARDIAC CATHETERIZATION   CLINICAL HISTORY:  [name] is 65 years old and has a history of  hypertension and has had some shortness of breath with exertion.  She had a  Cardiolite scan ordered by Dr. Debby Bud which suggested possible apical  ischemia.  She was seen in consultation by Dr. Dietrich Pates, who scheduled her  for evaluation angiography.   PROCEDURE:  The procedure was performed in the outpatient laboratory.  Procedure was performed by the right femoral artery, arterial sheath and #4  [place] preformed coronary catheters.  A femoral arterial puncture was  performed and nonopaque contrast was used.  A distal fluorogram was  performed to rule out renal vascular causes for hypertension.  The patient  tolerated the procedure well and left the laboratory in satisfactory  condition.   RESULTS:  The left main coronary artery was free of significant disease.   The left anterior descending artery gave rise to two diagonal branches and  two septal perforators.  These and the LAD proper were free of significant  disease.   The circumflex artery gave rise to a marginal branch, an atrial branch and a  posterolateral branch.  These vessels were free of significant disease.   The right coronary artery was a moderate sized vessel and gave rise to two  right ventricular branches, the posterior descending branch and  posterolateral branch.  These vessels were free of significant disease.   The left ventriculogram in the RAO projections showed good wall motion with  no areas of hypokinesis.  The estimated ejection fraction was 60%.   A distal fluorogram was performed which  showed patent renal arteries and no  significant aortic obstruction.   The aortic pressure was 160/90 with mean of 121.  Left neck pressure was  160/13.   CONCLUSION:  Normal coronary angiography and left ventricular wall motion.   RECOMMENDATIONS:  Reassurance.  In view of these findings, I do not think  the abnormal Cardiolite scan has any significance.  We will plan reassurance  and have the patient follow up with Dr. Debby Bud in one to two weeks for a  groin check and continued primary prevention.      BB/MEDQ  D:  05/31/2004  T:  05/31/2004  Job:  [id number]   cc:   Rosalyn Gess. Norins, M.D. [facility]   Pricilla Riffle, M.D.   Cardiopulmonary Lab

## 2010-08-26 NOTE — Discharge Summary (Signed)
[name], [name]              ACCOUNT NO.:  [id number]   MEDICAL RECORD NO.:  [id number]          PATIENT TYPE:  INP   LOCATION:  5007                         FACILITY:  [facility]   PHYSICIAN:  Claude Manges. Whitfield, M.D.DATE OF BIRTH:  April 27, 1945   DATE OF ADMISSION:  12/08/2004  DATE OF DISCHARGE:  12/12/2004                                 DISCHARGE SUMMARY   ADMISSION DIAGNOSIS:  1.  Severe osteoarthritis of the left knee.  2.  Hypertension.  3.  Asthma.  4.  Sleep apnea.   POSTOPERATIVE DIAGNOSIS:  1.  Status post left total knee arthroplasty.  2.  Urinary tract infection, treated.  3.  Gastroesophageal reflux disease, placed on Protonix.  4.  Hypokalemia, resolved.  5.  Tachycardia secondary to pain and anxiety.  6.  History of hypertension.  7.  History of asthma.  8.  History of sleep apnea.   HISTORY OF PRESENT ILLNESS:  [name] is a 65 year old white female with  an approximately one year history of progressively worsening left knee pain.  The patient describes the left knee pain as a sharp, burning sensation that  occurs with weight-bearing activities and awkward range of motion.  The pain  does radiate up and down the left leg.  She has significant leg pain.  Mechanical symptoms positive catching sensation.  No previous injury.  X-  rays reveal near bone on bone medial compartment.   ALLERGIES:  No known drug allergies.   CURRENT MEDICATIONS:  Paxil CR 12.5 mg p.o. daily, Lisinopril 20 mg daily,  Lasix 20 mg p.o. daily, Advair 100/50 mg 2 puffs daily, Albuterol meterdose  inhaler p.r.n.   SURGICAL PROCEDURE:  The patient was taken to the operating room December 08, 2004, by Dr. Norlene Campbell, assisted by Arnoldo Morale, P.A.-C.  The patient  was placed under general anesthesia and underwent a left total knee  arthroplasty.  The following components were used:  A standard left femur,  size 2.5 tibial tray, 12.5 mm bearing, and a standard metal back patella,  all components cemented.  The patient tolerated the procedure well and  returned to the recovery room in good, stable condition.   CONSULTATIONS:  PT, OT, rehab.   HOSPITAL COURSE:  Postop day one, the patient was afebrile with vital signs  stable.  H&H 12.9 and 37.2, white count 14,000.  UA preop showed moderate  leukocytes, bacteria few, white blood cells 3-6, protein negative.  C&S  pending.  The patient was placed on Cipro 250 mg 1 p.o. b.i.d., empirically.  Potassium was 3.3 and was replaced.  Patient with tachycardia with heart  rate of 118 to 120 and this was felt to be secondary to pain.  Patient with  poor pain control on postoperative day one.  Postop day two, patient  afebrile, vital signs stable.  Hemoglobin 11.7, hematocrit 34.  Overall,  patient stated that her pain was under better control and that she felt  improved from the previous day.  Postop day three, patient reports anxiety  attack last night due to the fact unable to reach call button,  otherwise,  denied chest pain, shortness of breath, nausea and vomiting.  Complaint of  gastric reflux.  Minimal dysuria status post Foley catheter removal.  Urine  culture greater than 100,00o colonies multiple species and Cipro was  continued.  Patient remained tachycardic, heart rate in the 80 to 120 range.  Patient denied chest pain, shortness of breath, nausea and vomiting.  The  patient was placed on Protonix for her GERD.  Again, tachycardia was felt to  be secondary to pain and anxiety and this was monitored.  Patient with slow  progress with physical therapy and rehab consult was ordered.  Next, postop  day four, the patient with no complaints, 1 out of 10 pain, the wound was  stable.  The patient was neurovascularly intact on the left lower extremity  and range of motion was 5 to 75 degrees of flexion.  The patient was  discharged home later that day in good, stable condition.  With regards to  physical therapy, on  December 10, 2004, the patient was min assist, able to  ambulate 18 feet with a rolling walker.  By the late afternoon of December 11, 2004, the patient was min assist with bed mobility and with transfers,  min guard 65 feet with rolling walker.  On September 4, the patient was min  assist self transfer and rolling walker 75 feet self.   LABORATORY DATA:  Routine labs on admission revealed CBC within normal  limits, coags within normal limits.  Routine chemistries on admission  revealed glucose elevated at 117, otherwise, all values within normal  limits.  Hepatic enzymes on admission reveals all values within normal  limits.  Urinalysis on December 08, 2004, showed trace hemoglobin, moderate  leukocyte esterase, rare epithelial cells, white blood cells 3-6, bacteria  were few.  Urine cultures dated December 08, 2004, had greater than 100,000  colonies multiple species.  EKG on December 05, 2004, showed sinus rhythm with  frequent PVCs.  Heart rate 86 beats per minute, PR interval 158  milliseconds, T-axis 61/-21/52.   DISCHARGE MEDICATIONS:  Coumadin 5 mg take as directed per [facility],  OxyContin 10 mg SR 1 tab q.12h. p.r.n. pain, Protonix 40 mg 1 tablet daily,  Percocet 5/325, 1-2 tabs every 4-6 hours p.r.n. pain.   DISCHARGE INSTRUCTIONS:  Wound care:  The patient is to keep the wound clean  and dry, change dressing daily.  The patient may shower after two days if no  drainage.  Special instructions:  CPM 0 to 90 degrees 6-8 hours a day,  increase by 10 degrees daily.  Diet with no restrictions.  The patient is to  follow up with primary care physician in 2-3 weeks.  Follow up with Dr.  Cleophas Dunker approximately ten days from discharge, the patient is to call the  office at [phone] for appointment.  [facility] for home health PT and Coumadin  checks.  The patient was discharged to home in good, stable condition.      [name], P.A.      Claude Manges. Cleophas Dunker,  M.D. Electronically Signed    GC/MEDQ  D:  03/10/2005  T:  03/10/2005  Job:  [id number]   cc:   Rosalyn Gess. Norins, M.D. [facility]  [address]  [place]  [place] [address]

## 2010-08-26 NOTE — H&P (Signed)
[name], [name]              ACCOUNT NO.:  [id number]   MEDICAL RECORD NO.:  [id number]          PATIENT TYPE:  INP   LOCATION:                               FACILITY:  [facility]   PHYSICIAN:  Claude Manges. Whitfield, M.D.DATE OF BIRTH:  1945-10-21   DATE OF ADMISSION:  12/08/2004  DATE OF DISCHARGE:                                HISTORY & PHYSICAL   CHIEF COMPLAINT:  Left knee pain.   HISTORY OF PRESENT ILLNESS:  The patient is a 65 year old white female with  about a one-year history of progressively worsening left knee pain.  The  patient described it as a sharp burning sensation which occurs with weight-  bearing activities and awkward range of motion.  It does occur generally  throughout the knee, with some radiation up and down the leg.  She does have  significant night pain. She denies any swelling.  She does have some  catching sensation.  No previous injuries.  X-rays reveal near bone on bone  medial compartment.   ALLERGIES:  No known drug allergies.   CURRENT MEDICATIONS:  1.  Paxil CR 12.5 mg p.o. daily.  2.  Lisinopril 20 mg p.o. daily.  3.  Lasix 20 mg p.o. daily.  4.  Aleve p.r.n.  5.  Advair 100/50 mg, two puffs daily.  6.  Albuterol multi-dose inhaler p.r.n.   PAST MEDICAL HISTORY:  1.  Hypertension.  2.  Asthma.  3.  Allergies to cats, grasses and occasional exertional asthma.  4.  Sleep apnea.   PAST SURGICAL HISTORY:  1.  Hysterectomy in 1981.  2.  Ovarian removal in 1982.  3.  Kidney stone removal.  The patient denies any complications with the above-mentioned surgical  procedures.   SOCIAL HISTORY:  The patient is a 65 year old female, slightly heavyset,  with no history of smoking.  She does use occasional alcoholic beverage.  She is divorced and has two children.  She lives in a [place] house with  five steps to the main entrance.  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   PRIMARY CARE PHYSICIAN:  Rosalyn Gess. Norins, M.D.   FAMILY HISTORY:  Mother is alive with a history of coronary artery disease.  Father is deceased from chronic obstructive pulmonary disease complications.  One brother deceased from complications from an injury from fire.  One  brother alive with OA.  Four sisters alive no medical issues.   REVIEW OF SYSTEMS:  Positive for reading glasses.  She does have occasional  shortness of breath with exertion she relates, or asthma.  She has no  related cardiac-type symptoms.  Otherwise all the review of systems  categories are negative.   PHYSICAL EXAMINATION:  VITAL SIGNS:  Height 5 feet 4 inches, weight 190  pounds, blood pressure 158/88, pulse 76 and regular, respirations 12, non-  labored.  The patient is afebrile.  HEENT:  Head normocephalic.  Pupils equal, round, reactive.  Extraocular  movements intact.  Sclerae anicteric.  External ears without deformities.  Gross hearing is  intact.  Oral buccal mucosa is pink and moist.  NECK:  Supple, with no palpable lymphadenopathy.  The thyroid region was  nontender.  She had a good range of motion of her cervical spine.  CHEST:  Lung sounds were clear and equal bilaterally.  No wheezes, rales or  rhonchi.  HEART:  A regular rate and rhythm.  No murmurs, rubs or gallops.  ABDOMEN:  Soft, nontender, slightly obese-like.  The CVA region was  nontender.  EXTREMITIES:  The upper extremities were symmetric in size and sharp.  She  had a good range of motion of her shoulders, elbows and wrists.  The motor  strength was 5/5.  Lower extremities:  Right and left hip had full  extension, flexion up to 120 degrees, 20 degrees of internal and external  rotation without mechanical symptoms or discomfort.  The right knee was  normal-appearing.  No signs of erythema or ecchymosis.  No effusion.  She  had full extension, flexion back to 120 degrees.  No instability.  The calf  was nontender.  The left knee was  normal-appearing.  No signs of erythema or  ecchymosis.  No effusion.  She was tender along the medial joint line.  She  did have some valgus and varus laxity.  She had full extension and flexion  back to about 110 degrees.  The calf was nontender.  The ankles were  symmetrical with good dorsiflexion and plantar flexion.  PERIPHERAL VASCULAR:  Carotid pulses were 2+.  No bruits.  Radial pulses  were 2+.  Dorsalis pedis pulses were 2+.  She had no lower extremity edema  or venous stasis changes.  NEUROLOGIC:  The patient was conscious, alert and cooperative and held an  easy conversation with the examiner. She had no gross neurological defects  noted.  BREASTS/RECTAL/GENITOURINARY:  Exams were deferred at this time.   IMPRESSION:  1.  Severe osteoarthritis, left knee.  2.  Hypertension.  3.  Asthma.  4.  Sleep apnea.   PLAN:  The patient has been evaluated by her primary care physician, Dr.  Debby Bud, and cleared for this upcoming surgical procedure with indications  that she does occasionally use her CPAP machine.  The patient will undergo  all the routine labs and tests prior to having a left total knee  arthroplasty by Dr. Claude Manges. Whitfield at [facility] on  December 08, 2004.     Jamelle Rushing, P.A.      Claude Manges. Cleophas Dunker, M.D.  Electronically Signed   RWK/MEDQ  D:  11/30/2004  T:  11/30/2004  Job:  [id number]

## 2010-08-26 NOTE — Op Note (Signed)
[name], [name]              ACCOUNT NO.:  [id number]   MEDICAL RECORD NO.:  [id number]          PATIENT TYPE:  INP   LOCATION:  NA                           FACILITY:  [facility]   PHYSICIAN:  Claude Manges. Whitfield, M.D.DATE OF BIRTH:  [birth year]   DATE OF PROCEDURE:  12/08/2004  DATE OF DISCHARGE:                                 OPERATIVE REPORT   PREOPERATIVE DIAGNOSIS:  End-stage osteoarthritis of the left knee.   POSTOPERATIVE DIAGNOSIS:  End-stage osteoarthritis of the left knee.   PROCEDURE:  Left total knee replacement.   SURGEON:  Claude Manges. Cleophas Dunker, M.D.   ASSISTANT:  Legrand Pitts. Duffy, P.A.-C.   ANESTHESIA:  General.   COMPLICATIONS:  None.   COMPONENTS USED:  DePuy LCS Complete, standard femoral component, a #2.5  rotating keeled tibial component with a 12.5-mm bridging bearing and a metal-  back rotating three-pegged patella.  All were secured with  polymethylmethacrylate.   DESCRIPTION OF PROCEDURE:  With the patient comfortable on the operating  room and under general orotracheal anesthesia, nursing staff inserted a  Foley catheter.  The left lower extremity was then placed in a thigh  tourniquet.  The leg was prepped with Betadine scrub and then DuraPrep from  the tourniquet to the midfoot.  Sterile draping was performed.  With the  extremity still elevated, it was Esmarch exsanguinated with the proximal  tourniquet at 350 mmHg.   A midline longitudinal incision was made and centered about the patella.  By  sharp dissection, the incision was carried down to the subcutaneous tissue.  The first layer of capsule was incised in the midline.  A medial  parapatellar incision was then made with the Bovie through the deep capsule.  There was no effusion.   The patella was then everted 180 degrees, the knee flexed at 90 degrees.  There was diffuse beefy red synovitis.  Synovectomy was performed.  There  was complete absence of articular cartilage on the medial femoral  condyle  and diffusely about the patella.  There were large osteophytes on the medial  and lateral femoral condyle.  Osteophytes were removed.  Preoperatively, we  had templated a standard femoral component, and this was confirmed  intraoperatively.  We also had measured a #3 or 2.5 tibial tray.  The 2.5  was confirmed intraoperatively.   There was no fixed varus or valgus, so release was not necessary.   Initial cuts were then made transversing the proximal tibia with a 7-degree  angle of posterior declination.  Subsequent cuts were then made on the femur  using the standard jigs.  ACL and PCL were sacrificed.  MCL and LCL remained  in place.  10-mm flexion gaps appeared to be symmetrical.   Lamina spreaders then inserted to remove medial and lateral menisci and any  remnants of extraneous posterior capsule and any remnants of ACL and PCL.  The final femoral component jig was then applied to obtain the oblique cuts.  Osteophytes were removed from the posterior femoral condyle medially and  laterally.  There were no loose bodies.   The tibia was then  advanced anteriorly.  The center hole was then made  followed by the keeled cut.  The trial tibial component was then applied  followed by the 10-mm bridging bearing and the standard femoral component.  We thought that we had a little bit too much play with varus and valgus.  Accordingly, a 12.5-mm polyethylene component was then inserted with full  extension and much better stability.   The patella was then prepared by removing approximately 8 mm of bone,  leaving 13 mm of patellar thickness.  The patellar jig was applied, three  holes were made, and the trial patella applied.  Through a full range of  motion, there was no subluxation or dislocation.   The trial components were removed.  The joint was copiously irrigated with  saline.  The final components were then impacted with  polymethylmethacrylate.  Extraneous methacrylate was  removed from about the  tibia and femur.  The knee was placed in extension with the 12.5-mm final  polyethylene bearing.  The patella was applied with a bone clamp.  The joint  was then inspected, and any extraneous methacrylate was removed with an  osteotome.  The tourniquet was deflated.  Gross bleeders were Bovie  coagulated.  A Hemovac was not necessary.  The deep capsule was closed with  interrupted #1 Ethibond.  There was a small partial tear of the patellar  tendon in its midportion that was repaired with #2 Ethibond.  The  superficial capsule was closed with a running 2-0 Vicryl, the subcutaneous  tissue with 2-0 Vicryl, and the skin closed with skin clips.  A sterile  bulky dressing was applied following the patient's support stocking.  There  was good pulse and capillary refill to the foot.   The patient tolerated the procedure without complications.      Claude Manges. Cleophas Dunker, M.D.  Electronically Signed     PWW/MEDQ  D:  12/08/2004  T:  12/08/2004  Job:  [id number]

## 2010-08-26 NOTE — Procedures (Signed)
NAMERONETTA, MOLLA              ACCOUNT NO.:  [id number]   MEDICAL RECORD NO.:  [id number]          PATIENT TYPE:  OUT   LOCATION:  SLEEP CENTER                 FACILITY:  [facility]   PHYSICIAN:  Marcelyn Bruins, M.D. [facility] DATE OF BIRTH:  [birth year]   DATE OF STUDY:  05/06/2004                              NOCTURNAL POLYSOMNOGRAM   DATE OF STUDY:  May 06, 2004   REFERRING PHYSICIAN:  Dr. Illene Regulus   INDICATION FOR THE STUDY:  Hypersomnia with sleep apnea. Epworth score is  16.   SLEEP ARCHITECTURE:  The patient had a total sleep time of 339 minutes with  significantly decreased REM and slow wave sleep. Sleep onset latency was  prolonged at 53 minutes as was REM latency at 146 minutes.   IMPRESSION:  1.  Very severe obstructive sleep apnea/hypopnea syndrome with a respiratory      disturbance index of 103 events per hour with O2 desaturation as low as      69%. The obstructive events occurred in all sleep positions.  2.  Mild snoring noted throughout the study.  3.  No clinically significant cardiac arrhythmias.  4.  Small numbers of leg jerks with minimal sleep disruption.      KC/MEDQ  D:  05/17/2004 19:48:01  T:  05/17/2004 21:20:00  Job:  [id number]

## 2010-08-26 NOTE — Procedures (Signed)
[facility]  Patient:    [name], [name] Visit Number: [id number] MRN: [id number]          Service Type: EMS Location: ED Attending Physician:  Rosalyn Charters Dictated by:   Kari Baars, M.D. Proc. Date: 03/31/01 Admit Date:  03/31/2001 Discharge Date: 03/31/2001                            EKG Interpretations  The rhythm is sinus tachycardia with rate of about 100.  There is left anterior hemiblock.  There is an incomplete right bundle branch block.  There are T-wave abnormalities laterally which could be due to ischemia.  Abnormal electrocardiogram. Dictated by:   Kari Baars, M.D. Attending Physician:  Rosalyn Charters DD:  04/01/01 TD:  04/01/01 Job: [id number]

## 2010-08-26 NOTE — Procedures (Signed)
   NAME:  ANUSHREE, DORSI                        ACCOUNT NO.:  [id number]   MEDICAL RECORD NO.:  [id number]                   PATIENT TYPE:  OUT   LOCATION:  RESP                                 FACILITY:  [facility]   PHYSICIAN:  Edward L. Juanetta Gosling, M.D.             DATE OF BIRTH:  01/23/1946   DATE OF PROCEDURE:  06/05/2002  DATE OF DISCHARGE:                              PULMONARY FUNCTION TEST   1. Spirometry shows a mild to moderate ventilatory defect with air flow     obstruction.  2. Lung volumes are normal.  3. DLCO mildly reduced.  4. Arterial blood gases are normal.  5. There is insignificant change with inhaled bronchodilator.                                                Edward L. Juanetta Gosling, M.D.    [facility]/MED[facility]  D:  06/05/2002  T:  06/06/2002  Job:  [id number]

## 2010-11-21 ENCOUNTER — Ambulatory Visit: Payer: Medicare Other

## 2010-11-21 DIAGNOSIS — E119 Type 2 diabetes mellitus without complications: Secondary | ICD-10-CM

## 2010-11-21 LAB — HEMOGLOBIN A1C: Hgb A1c MFr Bld: 6.4 % (ref 4.6–6.5)

## 2010-11-28 ENCOUNTER — Encounter: Payer: Self-pay | Admitting: Internal Medicine

## 2011-03-03 ENCOUNTER — Encounter: Payer: Self-pay | Admitting: Internal Medicine

## 2011-03-03 ENCOUNTER — Ambulatory Visit (INDEPENDENT_AMBULATORY_CARE_PROVIDER_SITE_OTHER): Payer: Medicare Other | Admitting: Internal Medicine

## 2011-03-03 VITALS — BP 148/98 | HR 95 | Temp 98.4°F | Wt 184.8 lb

## 2011-03-03 DIAGNOSIS — R062 Wheezing: Secondary | ICD-10-CM

## 2011-03-03 DIAGNOSIS — J209 Acute bronchitis, unspecified: Secondary | ICD-10-CM

## 2011-03-03 DIAGNOSIS — I10 Essential (primary) hypertension: Secondary | ICD-10-CM

## 2011-03-03 DIAGNOSIS — IMO0001 Reserved for inherently not codable concepts without codable children: Secondary | ICD-10-CM

## 2011-03-03 MED ORDER — METHYLPREDNISOLONE ACETATE PF 80 MG/ML IJ SUSP
120.0000 mg | Freq: Once | INTRAMUSCULAR | Status: AC
  Administered 2011-03-03: 120 mg via INTRAMUSCULAR

## 2011-03-03 MED ORDER — HYDROCODONE-HOMATROPINE 5-1.5 MG/5ML PO SYRP: 5.0000 mL | ORAL_SOLUTION | Freq: Four times a day (QID) | ORAL | Status: AC | PRN

## 2011-03-03 MED ORDER — PREDNISONE 10 MG PO TABS: 10.0000 mg | ORAL_TABLET | Freq: Every day | ORAL | Status: DC

## 2011-03-03 MED ORDER — LEVOFLOXACIN 250 MG PO TABS: 250.0000 mg | ORAL_TABLET | Freq: Every day | ORAL | Status: AC

## 2011-03-03 NOTE — Patient Instructions (Signed)
You had the steroid shot today Take all new medications as prescribed Continue all other medications as before  

## 2011-03-04 ENCOUNTER — Encounter: Payer: Self-pay | Admitting: Internal Medicine

## 2011-03-04 NOTE — Assessment & Plan Note (Signed)
Mild elev, Continue all other medications as before,  to f/u any worsening symptoms or concerns  BP Readings from Last 3 Encounters:  03/03/11 148/98  06/06/10 142/80  05/05/10 138/88

## 2011-03-04 NOTE — Assessment & Plan Note (Signed)
Mild to mod, to monitor for onset polys or sugar > 200  to f/u any worsening symptoms or concerns

## 2011-03-04 NOTE — Assessment & Plan Note (Signed)
Mild to mod, for antibx course,  to f/u any worsening symptoms or concerns 

## 2011-03-04 NOTE — Assessment & Plan Note (Addendum)
Mild to mod, for predpack asd after depomedrol IM,  to f/u any worsening symptoms or concerns 

## 2011-03-04 NOTE — Progress Notes (Signed)
  Subjective:    Patient ID: LEKIA NIER, female    DOB: [birth year], 65 y.o.   MRN: [id number]  HPI  Here with acute onset mild to mod 2-3 days ST, HA, general weakness and malaise, with prod cough greenish sputum, but Pt denies chest pain, increased sob or doe, wheezing, orthopnea, PND, increased LE swelling, palpitations, dizziness or syncope, except for mild wheeezing onset the last 2 days.Pt denies new neurological symptoms such as facial or extremity weakness or numbness   Pt denies polydipsia, polyuria.  Overall good compliance with treatment, and good medicine tolerability. Past Medical History  Diagnosis Date  . Asthma   . Hypertension   . GERD (gastroesophageal reflux disease)   . History of nephrolithiasis   . Headache   . Type II or unspecified type diabetes mellitus without mention of complication, uncontrolled    Past Surgical History  Procedure Date  . Abdominal hysterectomy   . Oophorectomy     unilateral  . Ureteral extraction of kidney stone     does not have a smoking history on file. She does not have any smokeless tobacco history on file. Her alcohol and drug histories not on file. family history includes Arthritis in her mother; COPD in her father; and Hypertension in her mother.  There is no history of Diabetes. Allergies not on file No current outpatient prescriptions on file prior to visit.   Review of Systems Review of Systems  Constitutional: Negative for diaphoresis and unexpected weight change.  HENT: Negative for drooling and tinnitus.   Eyes: Negative for photophobia and visual disturbance.  Respiratory: Negative for choking and stridor.   Gastrointestinal: Negative for vomiting and blood in stool.  Genitourinary: Negative for hematuria and decreased urine volume.     Objective:   Physical Exam BP 148/98  Pulse 95  Temp(Src) 98.4 F (36.9 C) (Oral)  Wt 184 lb 12 oz (83.802 kg)  SpO2 96% Physical Exam  VS noted, mild ill Constitutional: Pt  appears well-developed and well-nourished.  HENT: Head: Normocephalic.  Right Ear: External ear normal.  Left Ear: External ear normal.  Bilat tm's mild erythema.  Sinus nontender.  Pharynx mild erythema Eyes: Conjunctivae and EOM are normal. Pupils are equal, round, and reactive to light.  Neck: Normal range of motion. Neck supple.  Cardiovascular: Normal rate and regular rhythm.   Pulmonary/Chest: Effort normal and breath sounds mild decreased bilat with wheeze.  Neurological: Pt is alert. No cranial nerve deficit.  Skin: Skin is warm. No erythema.  Psychiatric: Pt behavior is normal. Thought content normal.     Assessment & Plan:

## 2011-06-01 ENCOUNTER — Other Ambulatory Visit: Payer: Self-pay | Admitting: Internal Medicine

## 2011-11-04 ENCOUNTER — Other Ambulatory Visit: Payer: Self-pay | Admitting: Internal Medicine

## 2011-12-20 ENCOUNTER — Encounter: Payer: Self-pay | Admitting: Endocrinology

## 2011-12-20 ENCOUNTER — Ambulatory Visit (INDEPENDENT_AMBULATORY_CARE_PROVIDER_SITE_OTHER): Payer: Medicare Other | Admitting: Endocrinology

## 2011-12-20 ENCOUNTER — Telehealth: Payer: Self-pay | Admitting: Internal Medicine

## 2011-12-20 VITALS — BP 138/78 | HR 110 | Temp 98.1°F | Ht 64.0 in | Wt 186.0 lb

## 2011-12-20 DIAGNOSIS — R062 Wheezing: Secondary | ICD-10-CM

## 2011-12-20 MED ORDER — CEFUROXIME AXETIL 250 MG PO TABS: 250.0000 mg | ORAL_TABLET | Freq: Two times a day (BID) | ORAL | Status: AC

## 2011-12-20 MED ORDER — LOSARTAN POTASSIUM-HCTZ 50-12.5 MG PO TABS: 1.0000 | ORAL_TABLET | Freq: Every day | ORAL | Status: DC

## 2011-12-20 MED ORDER — METHYLPREDNISOLONE (PAK) 4 MG PO TABS: ORAL_TABLET | ORAL | Status: DC

## 2011-12-20 MED ORDER — GLUCOSE BLOOD VI STRP: 1.0000 | ORAL_STRIP | Freq: Every day | Status: DC

## 2011-12-20 NOTE — Patient Instructions (Addendum)
Please see dr Debby Bud soon for a regular checkup. here is a sample of "advair-250."  take 1 puff 2x a day.  rinse mouth after using. You can still take the albuterol as needed i have sent 2 prescriptions to your pharmacy: antibiotic, and steroid "pack." Loratadine-d (non-prescription) will help your congestion.  Change lisinopril-hctz to another one.  i have sent a prescription to your pharmacy. Here is a blood sugar meter.  check your blood sugar once a day.  vary the time of day when you check, between before the 3 meals, and at bedtime.  also check if you have symptoms of your blood sugar being too high or too low.  please keep a record of the readings and bring it to your next appointment here.  please call us sooner if your blood sugar goes below 70, or if you have a lot of readings over 200.  i have sent a prescription to your pharmacy, for strips.

## 2011-12-20 NOTE — Telephone Encounter (Signed)
Caller: Sylva/Patient; Patient Name: [name]; PCP: Illene Regulus (Adults only); Best Callback Phone Number: [phone]; Caller reports onset 12/18/11 of wheezing, now with cough, pain in back and lung area, afebrile. Guideline: Asthma, Albuterol not helping for long. Disposition: See within 4 hours due to sympotms not improving. patient agrees to appointment scheduled 1115 12/20/11 with George Hugh.

## 2011-12-20 NOTE — Progress Notes (Signed)
  Subjective:    Patient ID: [name], female    DOB: [birth year], 66 y.o.   MRN: [id number]  HPI Pt states few days of moderate dry-quality cough, and assoc wheezing. Albuterol does not help.  She says she has a tendency to wheeze even prior to this current illness.   Past Medical History  Diagnosis Date  . Asthma   . Hypertension   . GERD (gastroesophageal reflux disease)   . History of nephrolithiasis   . Headache   . Type II or unspecified type diabetes mellitus without mention of complication, uncontrolled     Past Surgical History  Procedure Date  . Abdominal hysterectomy   . Oophorectomy     unilateral  . Ureteral extraction of kidney stone     History   Social History  . Marital Status: Divorced    Spouse Name: N/A    Number of Children: N/A  . Years of Education: N/A   Occupational History  . Not on file.   Social History Main Topics  . Smoking status: Never Smoker   . Smokeless tobacco: Not on file  . Alcohol Use: Not on file  . Drug Use: Not on file  . Sexually Active: Not on file   Other Topics Concern  . Not on file   Social History Narrative   HSGMarried "67-'8, divorced; remained single1 payne -'29; 1 son -'50: two grandchildrenUnemployedLives alone- sruggle $$    Current Outpatient Prescriptions on File Prior to Visit  Medication Sig Dispense Refill  . albuterol (PROVENTIL HFA;VENTOLIN HFA) 108 (90 BASE) MCG/ACT inhaler Inhale 2 puffs into the lungs every 6 (six) hours as needed.      . lansoprazole (PREVACID 24HR) 15 MG capsule Take 15 mg by mouth daily.        Marland Kitchen losartan-hydrochlorothiazide (HYZAAR) 50-12.5 MG per tablet Take 1 tablet by mouth daily.  30 tablet  5    Not on File  Family History  Problem Relation Age of Onset  . Hypertension Mother   . Arthritis Mother   . COPD Father   . Diabetes Neg Hx    BP 138/78  Pulse 110  Temp 98.1 F (36.7 C) (Oral)  Ht 5\' 4"  (1.626 m)  Wt 186 lb (84.369 kg)  BMI 31.93 kg/m2  SpO2  94%   Review of Systems Denies fever, but she has myalgias    Objective:   Physical Exam VITAL SIGNS:  See vs page GENERAL: no distress head: no deformity eyes: no periorbital swelling, no proptosis external nose and ears are normal mouth: no lesion seen Both eac's and tm's are normal LUNGS:  Clear to auscultation      Assessment & Plan:  URI, new DM: she is at risk for worsenig hyperglycemia with steroid use HTN: well-controlled, but zestril could exac wheezing

## 2011-12-25 ENCOUNTER — Encounter: Payer: Self-pay | Admitting: Endocrinology

## 2011-12-25 ENCOUNTER — Ambulatory Visit (INDEPENDENT_AMBULATORY_CARE_PROVIDER_SITE_OTHER): Payer: Medicare Other | Admitting: Endocrinology

## 2011-12-25 ENCOUNTER — Telehealth: Payer: Self-pay | Admitting: Internal Medicine

## 2011-12-25 ENCOUNTER — Ambulatory Visit (INDEPENDENT_AMBULATORY_CARE_PROVIDER_SITE_OTHER)
Admission: RE | Admit: 2011-12-25 | Discharge: 2011-12-25 | Disposition: A | Discharge status: Home / Self Care | Payer: Medicare Other | Source: Ambulatory Visit | Attending: Endocrinology | Admitting: Endocrinology

## 2011-12-25 VITALS — BP 146/98 | HR 111 | Temp 99.2°F

## 2011-12-25 DIAGNOSIS — J45909 Unspecified asthma, uncomplicated: Secondary | ICD-10-CM

## 2011-12-25 DIAGNOSIS — IMO0001 Reserved for inherently not codable concepts without codable children: Secondary | ICD-10-CM

## 2011-12-25 LAB — GLUCOSE, POCT (MANUAL RESULT ENTRY): POC Glucose: 140 mg/dl (ref 70–99)

## 2011-12-25 MED ORDER — PROMETHAZINE-CODEINE 6.25-10 MG/5ML PO SYRP: 5.0000 mL | ORAL_SOLUTION | ORAL | Status: AC | PRN

## 2011-12-25 MED ORDER — PREDNISONE 20 MG PO TABS: ORAL_TABLET | ORAL | Status: DC

## 2011-12-25 MED ORDER — METHYLPREDNISOLONE SODIUM SUCC 125 MG IJ SOLR
125.0000 mg | Freq: Once | INTRAMUSCULAR | Status: AC
  Administered 2011-12-25: 125 mg via INTRAMUSCULAR

## 2011-12-25 NOTE — Telephone Encounter (Signed)
Caller: [place]/Patient; Patient Name: [name]; PCP: Illene Regulus (Adults only); Best Callback Phone Number: [phone] Pt is calling back. She was seen last Wed for cough on 12/20/11 with Dr. Everardo All. Pt has finished her Prednisone and is almost finished her Ceftin. Pt has had no improvement. She is afeb but continues to cough and has chills. Pt is using the inhaler q 4h and more frequently which is not helping the wheezing. RN triaged and advised a f/u appointment. Pt scheduled today at 11:00 with Dr. Everardo All.

## 2011-12-25 NOTE — Progress Notes (Signed)
  Subjective:    Patient ID: [name], female    DOB: 01-Oct-1945, 66 y.o.   MRN: [id number]  HPI The state of at least three ongoing medical problems is addressed today: Pt states few days of moderate cough in the chest, and assoc wheezing.  Despite advair medrol, and abx, she feels worse.  Last week, she called 911 due to sob, but she declined to be transported to the hospital.   DM: She in not checking cbg's HTN: she is on hyzaar, and likes it ok.     Review of Systems Denies fever, but she still has sore throat    Objective:   Physical Exam VITAL SIGNS:  See vs page GENERAL: no distress LUNGS:  Moderate diffuse wheezes.        Assessment & Plan:  Acute bronchitis, worse: Solumedrol 125 mg IM HTN, with probable situational component DM:

## 2011-12-25 NOTE — Patient Instructions (Addendum)
A chest-x-ray is requested for you today.  You will receive a letter with results.  Here is a prescription for cough syrup.   Take prednisone 3x20 mg daily.  i have sent a prescription to your pharmacy Please finish the cefuroxime.   check your blood sugar once a day.  vary the time of day when you check, between before the 3 meals, and at bedtime.  also check if you have symptoms of your blood sugar being too high or too low.  please keep a record of the readings and bring it to your next appointment here.  please call us sooner if your blood sugar goes below 70, or if you have a lot of readings over 200. Please continue the same blood pressure medication Please see Dr Debby Bud in a few days

## 2011-12-26 ENCOUNTER — Ambulatory Visit: Payer: Medicare Other | Admitting: Internal Medicine

## 2011-12-26 DIAGNOSIS — Z0289 Encounter for other administrative examinations: Secondary | ICD-10-CM

## 2011-12-28 ENCOUNTER — Telehealth: Payer: Self-pay | Admitting: Internal Medicine

## 2011-12-28 NOTE — Telephone Encounter (Signed)
Patient calling, has been sick with a cough/cold x 10 days.  Has been seen in the office x 2 and has completed the Ceftin that was given.  She is hoarse.  Her sputum is now yellow now.  Has wheezing this afternoon.  Stats that her sx are worse then they have been.  Traiged per Asthma protocal.  No appts available for her this afternoon.  She is asking for additional medication to be called in.

## 2011-12-28 NOTE — Telephone Encounter (Signed)
Reviewed Dr. George Hugh note from the 16th. Patient missed appointment for the 17th with [place]. Now with cough and wheezing having takeng oral prednisone for several days, gvien the late hour and lack of appointments she needs medical evaluation tonight - recommend [facility].

## 2011-12-28 NOTE — Telephone Encounter (Signed)
Pt advised and will go to [facility] ASAP

## 2012-01-01 ENCOUNTER — Telehealth: Payer: Self-pay | Admitting: Internal Medicine

## 2012-01-01 ENCOUNTER — Encounter: Payer: Self-pay | Admitting: Endocrinology

## 2012-01-01 ENCOUNTER — Ambulatory Visit (INDEPENDENT_AMBULATORY_CARE_PROVIDER_SITE_OTHER): Payer: Medicare Other | Admitting: Endocrinology

## 2012-01-01 VITALS — BP 150/92 | HR 106 | Temp 98.3°F | Resp 16 | Ht 64.0 in | Wt 176.8 lb

## 2012-01-01 DIAGNOSIS — IMO0001 Reserved for inherently not codable concepts without codable children: Secondary | ICD-10-CM

## 2012-01-01 DIAGNOSIS — J209 Acute bronchitis, unspecified: Secondary | ICD-10-CM

## 2012-01-01 LAB — GLUCOSE, POCT (MANUAL RESULT ENTRY): POC Glucose: 207 mg/dl — AB (ref 70–99)

## 2012-01-01 MED ORDER — LEVOFLOXACIN 500 MG PO TABS: 500.0000 mg | ORAL_TABLET | Freq: Every day | ORAL | Status: DC

## 2012-01-01 MED ORDER — METFORMIN HCL ER 500 MG PO TB24: 500.0000 mg | ORAL_TABLET | Freq: Every day | ORAL | Status: DC

## 2012-01-01 NOTE — Patient Instructions (Addendum)
Here is a prescription for cough syrup.   Please continue the same  prednisone. check your blood sugar once a day.  vary the time of day when you check, between before the 3 meals, and at bedtime.  also check if you have symptoms of your blood sugar being too high or too low.  please keep a record of the readings and bring it to your next appointment here.  please call us sooner if your blood sugar goes below 70, or if you have a lot of readings over 200. Please continue the same blood pressure medication.   Please see Dr Debby Bud soon.   i have sent 2 prescriptions to your pharmacy, for a different antibiotic, and for metformin (diabetes)

## 2012-01-01 NOTE — Telephone Encounter (Signed)
Caller: [name]/Patient; Patient Name: [name]; PCP: Illene Regulus (Adults only); Best Callback Phone Number: [phone]; Reason for call: Cough/Congestion; Onset of symptoms about 2 weeks ago 12/18/11.  Has been seen twice by office provider.  Has been on antibiotic which has completed; Also given prednisone which she continues; Continues to make phlegm that noting seems to be breaking up.  Nasal drainage is yellow and coughing up yellow as well.  Afebrile.  Is wheezing with a history of asthma.  Little appetite; drinking well; Was seen on 9/11 and 16 th and states there is little if any improvement.  Triaged using Upper respiratory infection with a disposition to be seen within 24 hours due to productive cough with colored sputum.  Care advice given with appointment scheduled with Dr. Everardo All at 15:15 on 01/01/12. Caller demonstrated understanding.

## 2012-01-01 NOTE — Progress Notes (Signed)
  Subjective:    Patient ID: [name], female    DOB: Aug 22, 1945, 66 y.o.   MRN: [id number]  HPI The state of at least three ongoing medical problems is addressed today: Pt was seen here 1 week ago, due to acute bronchitis.  She feels no better, despite abx, advair, and prednisone.  She still has decreased appetite, prod cough, and wheezing.    DM: She is still not checking cbg's.   HTN: she takes hyzaar as rx'ed. Past Medical History  Diagnosis Date  . Asthma   . Hypertension   . GERD (gastroesophageal reflux disease)   . History of nephrolithiasis   . Headache   . Type II or unspecified type diabetes mellitus without mention of complication, uncontrolled     Past Surgical History  Procedure Date  . Abdominal hysterectomy   . Oophorectomy     unilateral  . Ureteral extraction of kidney stone     History   Social History  . Marital Status: Divorced    Spouse Name: N/A    Number of Children: N/A  . Years of Education: N/A   Occupational History  . Not on file.   Social History Main Topics  . Smoking status: Never Smoker   . Smokeless tobacco: Not on file  . Alcohol Use: Not on file  . Drug Use: Not on file  . Sexually Active: Not on file   Other Topics Concern  . Not on file   Social History Narrative   HSGMarried "67-'8, divorced; remained single1 payne -'58; 1 son -'28: two grandchildrenUnemployedLives alone- sruggle $$    Current Outpatient Prescriptions on File Prior to Visit  Medication Sig Dispense Refill  . lansoprazole (PREVACID 24HR) 15 MG capsule Take 15 mg by mouth daily.        Marland Kitchen albuterol (PROVENTIL HFA;VENTOLIN HFA) 108 (90 BASE) MCG/ACT inhaler Inhale 2 puffs into the lungs every 6 (six) hours as needed.      Marland Kitchen glucose blood (ONE TOUCH ULTRA TEST) test strip 1 each by Other route daily. And lancets 1/day  30 each  12  . losartan-hydrochlorothiazide (HYZAAR) 50-12.5 MG per tablet Take 1 tablet by mouth daily.  30 tablet  5  . metFORMIN  (GLUCOPHAGE-XR) 500 MG 24 hr tablet Take 1 tablet (500 mg total) by mouth daily with breakfast.  30 tablet  11  . predniSONE (DELTASONE) 20 MG tablet 3 tabs daily  100 tablet  0  . promethazine-codeine (PHENERGAN WITH CODEINE) 6.25-10 MG/5ML syrup Take 5 mLs by mouth every 4 (four) hours as needed for cough.  240 mL  0    No Known Allergies  Family History  Problem Relation Age of Onset  . Hypertension Mother   . Arthritis Mother   . COPD Father   . Diabetes Neg Hx     BP 150/92  Pulse 106  Temp 98.3 F (36.8 C)  Resp 16  Ht 5\' 4"  (1.626 m)  Wt 176 lb 12.8 oz (80.196 kg)  BMI 30.35 kg/m2  SpO2 96%    Review of Systems Denies fever and n/v    Objective:   Physical Exam VITAL SIGNS:  See vs page GENERAL: no distress LUNGS:  Clear to auscultation, except for diffuse wheezes.     CBG is noted    Assessment & Plan:  Acute bronchitis, persistent HTN, with situational component DM, exac by steroids

## 2012-09-19 ENCOUNTER — Other Ambulatory Visit: Payer: Self-pay

## 2012-09-19 MED ORDER — LOSARTAN POTASSIUM-HCTZ 50-12.5 MG PO TABS: 1.0000 | ORAL_TABLET | Freq: Every day | ORAL | Status: DC

## 2012-11-13 ENCOUNTER — Ambulatory Visit (INDEPENDENT_AMBULATORY_CARE_PROVIDER_SITE_OTHER): Payer: Medicare Other

## 2012-11-13 ENCOUNTER — Encounter: Payer: Self-pay | Admitting: General Practice

## 2012-11-13 ENCOUNTER — Ambulatory Visit (INDEPENDENT_AMBULATORY_CARE_PROVIDER_SITE_OTHER): Payer: Medicare Other | Admitting: General Practice

## 2012-11-13 VITALS — BP 184/94 | HR 92 | Temp 98.5°F | Ht 64.0 in | Wt 178.0 lb

## 2012-11-13 DIAGNOSIS — R109 Unspecified abdominal pain: Secondary | ICD-10-CM

## 2012-11-13 DIAGNOSIS — Z87440 Personal history of urinary (tract) infections: Secondary | ICD-10-CM

## 2012-11-13 DIAGNOSIS — N390 Urinary tract infection, site not specified: Secondary | ICD-10-CM

## 2012-11-13 LAB — POCT CBC
Granulocyte percent: 62.2 %G (ref 37–80)
HCT, POC: 41.6 % (ref 37.7–47.9)
Hemoglobin: 13.9 g/dL (ref 12.2–16.2)
Lymph, poc: 3.1 (ref 0.6–3.4)
MCH, POC: 28.7 pg (ref 27–31.2)
MCHC: 33.4 g/dL (ref 31.8–35.4)
MCV: 86 fL (ref 80–97)
MPV: 7.3 fL (ref 0–99.8)
POC Granulocyte: 5.9 (ref 2–6.9)
POC LYMPH PERCENT: 32.3 %L (ref 10–50)
Platelet Count, POC: 277 10*3/uL (ref 142–424)
RBC: 4.8 M/uL (ref 4.04–5.48)
RDW, POC: 13.3 %
WBC: 9.5 10*3/uL (ref 4.6–10.2)

## 2012-11-13 LAB — POCT UA - MICROSCOPIC ONLY: Yeast, UA: NEGATIVE

## 2012-11-13 LAB — POCT URINALYSIS DIPSTICK
Bilirubin, UA: NEGATIVE
Glucose, UA: NEGATIVE
Ketones, UA: NEGATIVE
Nitrite, UA: NEGATIVE
Spec Grav, UA: 1.02
Urobilinogen, UA: NEGATIVE
pH, UA: 5

## 2012-11-13 MED ORDER — FLUCONAZOLE 150 MG PO TABS: 150.0000 mg | ORAL_TABLET | Freq: Once | ORAL | Status: DC

## 2012-11-13 MED ORDER — CIPROFLOXACIN HCL 500 MG PO TABS: 500.0000 mg | ORAL_TABLET | Freq: Two times a day (BID) | ORAL | Status: DC

## 2012-11-13 NOTE — Patient Instructions (Signed)
Urinary Tract Infection  Urinary tract infections (UTIs) can develop anywhere along your urinary tract. Your urinary tract is your body's drainage system for removing wastes and extra water. Your urinary tract includes two kidneys, two ureters, a bladder, and a urethra. Your kidneys are a pair of bean-shaped organs. Each kidney is about the size of your fist. They are located below your ribs, one on each side of your spine.  CAUSES  Infections are caused by microbes, which are microscopic organisms, including fungi, viruses, and bacteria. These organisms are so small that they can only be seen through a microscope. Bacteria are the microbes that most commonly cause UTIs.  SYMPTOMS   Symptoms of UTIs may vary by age and gender of the patient and by the location of the infection. Symptoms in young women typically include a frequent and intense urge to urinate and a painful, burning feeling in the bladder or urethra during urination. Older women and men are more likely to be tired, shaky, and weak and have muscle aches and abdominal pain. A fever may mean the infection is in your kidneys. Other symptoms of a kidney infection include pain in your back or sides below the ribs, nausea, and vomiting.  DIAGNOSIS  To diagnose a UTI, your caregiver will ask you about your symptoms. Your caregiver also will ask to provide a urine sample. The urine sample will be tested for bacteria and white blood cells. White blood cells are made by your body to help fight infection.  TREATMENT   Typically, UTIs can be treated with medication. Because most UTIs are caused by a bacterial infection, they usually can be treated with the use of antibiotics. The choice of antibiotic and length of treatment depend on your symptoms and the type of bacteria causing your infection.  HOME CARE INSTRUCTIONS   If you were prescribed antibiotics, take them exactly as your caregiver instructs you. Finish the medication even if you feel better after you  have only taken some of the medication.   Drink enough water and fluids to keep your urine clear or pale yellow.   Avoid caffeine, tea, and carbonated beverages. They tend to irritate your bladder.   Empty your bladder often. Avoid holding urine for long periods of time.   Empty your bladder before and after sexual intercourse.   After a bowel movement, women should cleanse from front to back. Use each tissue only once.  SEEK MEDICAL CARE IF:    You have back pain.   You develop a fever.   Your symptoms do not begin to resolve within 3 days.  SEEK IMMEDIATE MEDICAL CARE IF:    You have severe back pain or lower abdominal pain.   You develop chills.   You have nausea or vomiting.   You have continued burning or discomfort with urination.  MAKE SURE YOU:    Understand these instructions.   Will watch your condition.   Will get help right away if you are not doing well or get worse.  Document Released: 01/04/2005 Document Revised: 09/26/2011 Document Reviewed: 05/05/2011  [employer] Patient Information 2014 [employer].

## 2012-11-13 NOTE — Progress Notes (Signed)
Subjective:    Patient ID: [name], female    DOB: Feb 19, 1946, 67 y.o.   MRN: [id number]  HPI Patient presents today complaining of lower abdominal pain, with onset 6 weeks ago. She reports pain to be an 8 on 1-10 scale. She reports pap was 20 years ago. Reports pain to be cramps and pressure. Reports pain radiates to lower back periodically. Partial hysterectomy in 1981.     Review of Systems  Constitutional: Negative for fever and chills.  Respiratory: Negative for chest tightness and shortness of breath.   Cardiovascular: Negative for chest pain and palpitations.  Gastrointestinal: Positive for nausea and abdominal pain. Negative for vomiting and blood in stool.  Genitourinary: Negative for dysuria and difficulty urinating.  Musculoskeletal: Negative for back pain.  Neurological: Negative for dizziness, weakness and headaches.       Objective:   Physical Exam  Constitutional: She is oriented to person, place, and time. She appears well-developed and well-nourished.  HENT:  Head: Normocephalic and atraumatic.  Cardiovascular: Normal rate, regular rhythm and normal heart sounds.   Pulmonary/Chest: Effort normal and breath sounds normal. No respiratory distress. She exhibits no tenderness.  Abdominal: Soft. Bowel sounds are normal. She exhibits no distension. There is tenderness in the suprapubic area and left upper quadrant. There is no CVA tenderness, no tenderness at McBurney's point and negative Murphy's sign.  Neurological: She is alert and oriented to person, place, and time.  Skin: Skin is warm and dry.  Psychiatric: She has a normal mood and affect.   [facility] reading (PRIMARY) by Coralie Keens, FNP-C, kidney stones noted bilaterally, no other findings.                              Results for orders placed in visit on 11/13/12  POCT UA - MICROSCOPIC ONLY      Result Value Range   WBC, Ur, HPF, POC 40-60     RBC, urine, microscopic 100-150     Bacteria, U  Microscopic few     Mucus, UA mod     Epithelial cells, urine per micros occ     Crystals, Ur, HPF, POC occ     Casts, Ur, LPF, POC occ     Yeast, UA neg    POCT URINALYSIS DIPSTICK      Result Value Range   Color, UA amber     Clarity, UA clear     Glucose, UA neg     Bilirubin, UA neg     Ketones, UA neg     Spec Grav, UA 1.020     Blood, UA large     pH, UA 5.0     Protein, UA large     Urobilinogen, UA negative     Nitrite, UA neg     Leukocytes, UA Trace    POCT CBC      Result Value Range   WBC 9.5  4.6 - 10.2 K/uL   Lymph, poc 3.1  0.6 - 3.4   POC LYMPH PERCENT 32.3  10 - 50 %L   POC Granulocyte 5.9  2 - 6.9   Granulocyte percent 62.2  37 - 80 %G   RBC 4.8  4.04 - 5.48 M/uL   Hemoglobin 13.9  12.2 - 16.2 g/dL   HCT, POC 40.9  81.1 - 47.9 %   MCV 86.0  80 - 97 fL   MCH, POC 28.7  27 -  31.2 pg   MCHC 33.4  31.8 - 35.4 g/dL   RDW, POC 40.9     Platelet Count, POC 277.0  142 - 424 K/uL   MPV 7.3  0 - 99.8 fL          Assessment & Plan:  1. Abdominal  pain, other specified site - POCT UA - Microscopic Only - POCT urinalysis dipstick - DG Abd 1 View; Future - POCT CBC - Urine culture  2. UTI (urinary tract infection) - ciprofloxacin (CIPRO) 500 MG tablet; Take 1 tablet (500 mg total) by mouth 2 (two) times daily.  Dispense: 20 tablet; Refill: 0 - fluconazole (DIFLUCAN) 150 MG tablet; Take 1 tablet (150 mg total) by mouth once. May repeat in 3 days  Dispense: 2 tablet; Refill: 0 - POCT UA - Microscopic Only; Future - POCT urinalysis dipstick; Future - Urine culture -will order ultrasound or CT scan if symptoms worsen or no improvement with antibiotics -RTO if symptoms worsen -Patient verbalized understanding -Coralie Keens, FNP-C

## 2012-11-14 LAB — URINE CULTURE

## 2013-07-30 ENCOUNTER — Ambulatory Visit (INDEPENDENT_AMBULATORY_CARE_PROVIDER_SITE_OTHER): Payer: Medicare Other | Admitting: General Practice

## 2013-07-30 ENCOUNTER — Telehealth: Payer: Self-pay | Admitting: General Practice

## 2013-07-30 ENCOUNTER — Ambulatory Visit (INDEPENDENT_AMBULATORY_CARE_PROVIDER_SITE_OTHER): Payer: Medicare Other

## 2013-07-30 ENCOUNTER — Encounter: Payer: Self-pay | Admitting: General Practice

## 2013-07-30 VITALS — BP 150/84 | HR 100 | Temp 98.9°F | Ht 64.0 in | Wt 183.6 lb

## 2013-07-30 DIAGNOSIS — I517 Cardiomegaly: Secondary | ICD-10-CM

## 2013-07-30 DIAGNOSIS — R0602 Shortness of breath: Secondary | ICD-10-CM

## 2013-07-30 DIAGNOSIS — J45909 Unspecified asthma, uncomplicated: Secondary | ICD-10-CM

## 2013-07-30 MED ORDER — ALBUTEROL SULFATE HFA 108 (90 BASE) MCG/ACT IN AERS: 2.0000 | INHALATION_SPRAY | Freq: Four times a day (QID) | RESPIRATORY_TRACT | Status: DC | PRN

## 2013-07-30 NOTE — Progress Notes (Signed)
   Subjective:    Patient ID: [name], female    DOB: Aug 01, 1945, 68 y.o.   MRN: [id number]  Asthma She complains of shortness of breath. There is no chest tightness, cough, difficulty breathing, hemoptysis, sputum production or wheezing. This is a recurrent problem. The current episode started yesterday. The problem occurs daily. The problem has been unchanged. Pertinent negatives include no chest pain, ear pain, fever, headaches or nasal congestion. Her symptoms are aggravated by climbing stairs and strenuous activity. Her symptoms are alleviated by rest. Her past medical history is significant for asthma. There is no history of bronchitis.       Review of Systems  Constitutional: Negative for fever and chills.  HENT: Negative for ear pain.   Respiratory: Positive for shortness of breath. Negative for cough, hemoptysis, sputum production, chest tightness and wheezing.   Cardiovascular: Negative for chest pain and palpitations.  Neurological: Negative for dizziness, weakness and headaches.       Objective:   Physical Exam  Constitutional: She is oriented to person, place, and time. She appears well-developed and well-nourished.  Cardiovascular: Normal rate, regular rhythm and normal heart sounds.   Pulmonary/Chest: Effort normal. No respiratory distress. She has decreased breath sounds in the right lower field and the left lower field. She has no wheezes. She exhibits no tenderness.  Neurological: She is alert and oriented to person, place, and time.  Skin: Skin is warm and dry.  Psychiatric: She has a normal mood and affect.     [facility] reading (PRIMARY) by Erby Pian, FNP-C, cardiomegaly noted.      Assessment & Plan:  1. Shortness of breath  - DG Chest 2 View; Future  2. Asthma  - albuterol (PROVENTIL HFA;VENTOLIN HFA) 108 (90 BASE) MCG/ACT inhaler; Inhale 2 puffs into the lungs every 6 (six) hours as needed.  Dispense: 1 Inhaler; Refill: 6  3.  Cardiomegaly  - Ambulatory referral to Cardiology -RTO prn -may seek emergency medical treatment -Patient verbalized understanding Erby Pian, FNP-C

## 2013-07-30 NOTE — Telephone Encounter (Signed)
Cardio referral made. Echo wasn't mentioned. Cardio can order if needed. Patient aware.

## 2013-08-03 ENCOUNTER — Other Ambulatory Visit: Payer: Self-pay | Admitting: General Practice

## 2013-08-05 ENCOUNTER — Ambulatory Visit (INDEPENDENT_AMBULATORY_CARE_PROVIDER_SITE_OTHER): Payer: Medicare Other | Admitting: Family Medicine

## 2013-08-05 ENCOUNTER — Encounter: Payer: Self-pay | Admitting: Family Medicine

## 2013-08-05 VITALS — BP 169/86 | HR 103 | Temp 98.2°F | Ht 64.0 in | Wt 186.0 lb

## 2013-08-05 DIAGNOSIS — R062 Wheezing: Secondary | ICD-10-CM

## 2013-08-05 MED ORDER — PREDNISONE 10 MG PO TABS: 10.0000 mg | ORAL_TABLET | Freq: Every day | ORAL | Status: DC

## 2013-08-05 MED ORDER — LEVALBUTEROL HCL 1.25 MG/0.5ML IN NEBU: 1.2500 mg | INHALATION_SOLUTION | Freq: Once | RESPIRATORY_TRACT | Status: DC

## 2013-08-05 MED ORDER — AZITHROMYCIN 250 MG PO TABS: ORAL_TABLET | ORAL | Status: DC

## 2013-08-05 MED ORDER — PREDNISONE 10 MG PO TABS: 10.0000 mg | ORAL_TABLET | Freq: Every day | ORAL | Status: AC

## 2013-08-05 NOTE — Progress Notes (Signed)
   Subjective:    Patient ID: [name], female    DOB: 05-25-1945, 68 y.o.   MRN: [id number]  HPI  This 68 y.o. female presents for evaluation of URI, cough, asthma flare, and difficulties with OSA She was dx with OSA 5 years ago but has not been wearing her CPAP because it causes problems With claustrophobia.  She is scheduled to see cardiology for chest tightness.  Review of Systems C/o cough, uri, and wheezing   No chest pain, SOB, HA, dizziness, vision change, N/V, diarrhea, constipation, dysuria, urinary urgency or frequency, myalgias, arthralgias or rash.  Objective:   Physical Exam  Vital signs noted  Well developed well nourished female.  HEENT - Head atraumatic Normocephalic                Eyes - PERRLA, Conjuctiva - clear Sclera- Clear EOMI                Ears - EAC's Wnl TM's Wnl Gross Hearing WNL                Nose - Nares patent                 Throat - oropharanx wnl Respiratory - Lungs with exp wheezes bilateral and CTA after neb tx Cardiac - RRR S1 and S2 without murmur GI - Abdomen soft Nontender and bowel sounds active x 4 Extremities - No edema. Neuro - Grossly intact.      Assessment & Plan:  Wheezing - Plan: levalbuterol (XOPENEX) nebulizer solution 1.25 mg, predniSONE (DELTASONE) 10 MG tablet 3 po qd x 3 then 2 po qd x 3 then 1 po qd x 3 days zpak as directed  Push po fluids, rest, tylenol and motrin otc prn as directed for fever, arthralgias, and myalgias.  Follow up prn if sx's continue or persist.  OSA - Rx for nasal pillows or nasal bipap given and patient told to go to [employer] with cpap machine to get the device and wear it to help her breathing and asthma.  Asthma - She is having exacerbation and tx with steroids, zpak, and recommend using MDI at home.  Follow up prn  [place]

## 2013-08-21 ENCOUNTER — Ambulatory Visit (INDEPENDENT_AMBULATORY_CARE_PROVIDER_SITE_OTHER): Payer: Medicare Other | Admitting: Cardiology

## 2013-08-21 ENCOUNTER — Encounter: Payer: Self-pay | Admitting: Cardiology

## 2013-08-21 VITALS — BP 140/72 | HR 97 | Ht 64.0 in | Wt 184.0 lb

## 2013-08-21 DIAGNOSIS — R0602 Shortness of breath: Secondary | ICD-10-CM

## 2013-08-21 DIAGNOSIS — I10 Essential (primary) hypertension: Secondary | ICD-10-CM

## 2013-08-21 LAB — BRAIN NATRIURETIC PEPTIDE: Pro B Natriuretic peptide (BNP): 160 pg/mL — ABNORMAL HIGH (ref 0.0–100.0)

## 2013-08-21 MED ORDER — LOSARTAN POTASSIUM-HCTZ 50-12.5 MG PO TABS: 1.0000 | ORAL_TABLET | Freq: Every day | ORAL | Status: DC

## 2013-08-21 NOTE — Progress Notes (Signed)
HPI The patient presents for evaluation of an abnormal chest x-ray. She's also had dyspnea. This has been going on for about a month. She's had asthma but thinks this is different than that. She's had wheezing but even after the wheezing went away she was still short of breath. Her weights have been stable but she has had some increased edema. She's also describing orthopnea although she's not had PND. She's getting dyspneic with activity such as climbing a flight of stairs which has been chronic but getting worse. She's not describing chest pressure, neck or arm discomfort. She's not had any palpitations, presyncope or syncope. A chest x-ray recently demonstrated some mild vascular congestion and cardiomegaly.  Allergies  Allergen Reactions  . Penicillins     Current Outpatient Prescriptions  Medication Sig Dispense Refill  . albuterol (PROVENTIL HFA;VENTOLIN HFA) 108 (90 BASE) MCG/ACT inhaler Inhale 2 puffs into the lungs every 6 (six) hours as needed.  1 Inhaler  6  . clobetasol ointment (TEMOVATE) 7.62 % Apply 1 application topically 2 (two) times daily. As needed      . losartan-hydrochlorothiazide (HYZAAR) 50-12.5 MG per tablet Take 1 tablet by mouth daily.  90 tablet  0   Current Facility-Administered Medications  Medication Dose Route Frequency Provider Last Rate Last Dose  . levalbuterol (XOPENEX) nebulizer solution 1.25 mg  1.25 mg Nebulization Once Lysbeth Penner, FNP        Past Medical History  Diagnosis Date  . Asthma   . Hypertension   . GERD (gastroesophageal reflux disease)   . History of nephrolithiasis   . Headache(784.0)   . Type II or unspecified type diabetes mellitus without mention of complication, uncontrolled     Past Surgical History  Procedure Laterality Date  . Abdominal hysterectomy    . Oophorectomy      unilateral  . Ureteral extraction of kidney stone      Family History  Problem Relation Age of Onset  . Hypertension Mother   . Arthritis  Mother   . COPD Father   . Diabetes Neg Hx     History   Social History  . Marital Status: Divorced    Spouse Name: N/A    Number of Children: N/A  . Years of Education: N/A   Occupational History  . Not on file.   Social History Main Topics  . Smoking status: Never Smoker   . Smokeless tobacco: Never Used  . Alcohol Use: Yes     Comment: rare  . Drug Use: No  . Sexual Activity: Not on file   Other Topics Concern  . Not on file   Social History Narrative   HSG   Married "67-'8, divorced; remained single   1 payne -'43; 1 son -'52: two grandchildren   Unemployed   Lives alone- sruggle $$    ROS:   A positive for dizziness.  Other  PHYSICAL EXAM BP 140/72  Pulse 97  Ht 5\' 4"  (1.626 m)  Wt 184 lb (83.462 kg)  BMI 31.57 kg/m2 GENERAL:  Well appearing HEENT:  Pupils equal round and reactive, fundi not visualized, oral mucosa unremarkable NECK:  Positive jugular venous distention at 6 cm, waveform within normal limits, carotid upstroke brisk and symmetric, no bruits, no thyromegaly LYMPHATICS:  No cervical, inguinal adenopathy LUNGS:  Clear to auscultation bilaterally BACK:  No CVA tenderness CHEST:  Unremarkable HEART:  PMI not displaced or sustained,S1 and S2 within normal limits, no S3, no S4, no clicks, no  rubs, no murmurs ABD:  Flat, positive bowel sounds normal in frequency in pitch, no bruits, no rebound, no guarding, no midline pulsatile mass, no hepatomegaly, no splenomegaly EXT:  2 plus pulses throughout, trace edema, no cyanosis no clubbing SKIN:  No rashes no nodules NEURO:  Cranial nerves II through XII grossly intact, motor grossly intact throughout PSYCH:  Cognitively intact, oriented to person place and time   EKG:  Sinus rhythm, rate 97, left axis deviation, left ventricle hypertrophy, incomplete right bundle branch block, poor anterior R wave progression, QTC prolonged, no acute ST-T wave changes. PVCs. 08/21/2013  ASSESSMENT AND PLAN  DYSPNEA:   I am concerned about possible cardiac etiology. I am going to start with a BNP level and an echocardiogram. If these are unremarkable I will pursue stress testing. Because of dyspnea she would not be a walk on a treadmill. Therefore, she will have a [employer].  HTN:  For now she will continue on the meds as listed. Her

## 2013-08-21 NOTE — Patient Instructions (Addendum)
The current medical regimen is effective;  continue present plan and medications.  Please have blood work today. (BNP)  Your physician has requested that you have an echocardiogram. Echocardiography is a painless test that uses sound waves to create images of your heart. It provides your doctor with information about the size and shape of your heart and how well your heart's chambers and valves are working. This procedure takes approximately one hour. There are no restrictions for this procedure.  Follow up in 6 months with Dr Percival Spanish in the [place] office.  You will receive a letter in the mail 2 months before you are due.  Please call us when you receive this letter to schedule your follow up appointment.

## 2013-08-26 ENCOUNTER — Ambulatory Visit (HOSPITAL_COMMUNITY): Payer: Medicare Other | Attending: Cardiovascular Disease | Admitting: Cardiology

## 2013-08-26 DIAGNOSIS — I10 Essential (primary) hypertension: Secondary | ICD-10-CM

## 2013-08-26 DIAGNOSIS — R0609 Other forms of dyspnea: Secondary | ICD-10-CM | POA: Insufficient documentation

## 2013-08-26 DIAGNOSIS — R0989 Other specified symptoms and signs involving the circulatory and respiratory systems: Secondary | ICD-10-CM | POA: Insufficient documentation

## 2013-08-26 DIAGNOSIS — R0602 Shortness of breath: Secondary | ICD-10-CM

## 2013-08-26 NOTE — Progress Notes (Signed)
Echo performed. 

## 2013-08-29 ENCOUNTER — Telehealth: Payer: Self-pay | Admitting: Cardiology

## 2013-08-29 ENCOUNTER — Encounter: Payer: Self-pay | Admitting: *Deleted

## 2013-08-29 DIAGNOSIS — I509 Heart failure, unspecified: Secondary | ICD-10-CM

## 2013-08-29 DIAGNOSIS — Z01810 Encounter for preprocedural cardiovascular examination: Secondary | ICD-10-CM

## 2013-08-29 DIAGNOSIS — R0600 Dyspnea, unspecified: Secondary | ICD-10-CM

## 2013-08-29 NOTE — Telephone Encounter (Signed)
Pt scheduled for 5/27 at 11:30 am.  She should arrive at 9:30am - see instruction sheet.

## 2013-08-29 NOTE — Telephone Encounter (Signed)
New message      Pt is wanting to go ahead and get cath scheduled.  Please call with time/date

## 2013-09-02 ENCOUNTER — Encounter (HOSPITAL_COMMUNITY): Payer: Self-pay | Admitting: Pharmacy Technician

## 2013-09-02 ENCOUNTER — Other Ambulatory Visit (INDEPENDENT_AMBULATORY_CARE_PROVIDER_SITE_OTHER): Payer: Medicare Other

## 2013-09-02 DIAGNOSIS — Z01810 Encounter for preprocedural cardiovascular examination: Secondary | ICD-10-CM

## 2013-09-02 DIAGNOSIS — R0609 Other forms of dyspnea: Secondary | ICD-10-CM

## 2013-09-02 DIAGNOSIS — I509 Heart failure, unspecified: Secondary | ICD-10-CM

## 2013-09-02 DIAGNOSIS — R0600 Dyspnea, unspecified: Secondary | ICD-10-CM

## 2013-09-02 DIAGNOSIS — R0989 Other specified symptoms and signs involving the circulatory and respiratory systems: Secondary | ICD-10-CM

## 2013-09-02 LAB — CBC
HCT: 37.1 % (ref 36.0–46.0)
Hemoglobin: 12.6 g/dL (ref 12.0–15.0)
MCHC: 34 g/dL (ref 30.0–36.0)
MCV: 87.3 fl (ref 78.0–100.0)
Platelets: 244 10*3/uL (ref 150.0–400.0)
RBC: 4.25 Mil/uL (ref 3.87–5.11)
RDW: 13.9 % (ref 11.5–15.5)
WBC: 7.6 10*3/uL (ref 4.0–10.5)

## 2013-09-02 LAB — BASIC METABOLIC PANEL
BUN: 18 mg/dL (ref 6–23)
CO2: 28 mEq/L (ref 19–32)
Calcium: 9.5 mg/dL (ref 8.4–10.5)
Chloride: 105 mEq/L (ref 96–112)
Creatinine, Ser: 0.9 mg/dL (ref 0.4–1.2)
GFR: 69.56 mL/min (ref >60.00)
Glucose, Bld: 283 mg/dL — ABNORMAL HIGH (ref 70–99)
Potassium: 3.2 mEq/L — ABNORMAL LOW (ref 3.5–5.1)
Sodium: 142 mEq/L (ref 135–145)

## 2013-09-02 LAB — PROTIME-INR
INR: 1 ratio (ref 0.8–1.0)
Prothrombin Time: 11.2 s (ref 9.6–13.1)

## 2013-09-02 NOTE — Addendum Note (Signed)
Addended by: Lauree Chandler D on: 09/02/2013 03:15 PM   Modules accepted: Orders

## 2013-09-03 ENCOUNTER — Other Ambulatory Visit: Payer: Self-pay | Admitting: Cardiology

## 2013-09-03 ENCOUNTER — Ambulatory Visit (HOSPITAL_COMMUNITY)
Admission: RE | Admit: 2013-09-03 | Discharge: 2013-09-03 | Disposition: A | Discharge status: Home / Self Care | Payer: Medicare Other | Source: Ambulatory Visit | Attending: Cardiovascular Disease | Admitting: Cardiovascular Disease

## 2013-09-03 ENCOUNTER — Encounter (HOSPITAL_COMMUNITY)
Admission: RE | Disposition: A | Discharge status: Home / Self Care | Payer: Self-pay | Source: Ambulatory Visit | Attending: Cardiovascular Disease

## 2013-09-03 DIAGNOSIS — K219 Gastro-esophageal reflux disease without esophagitis: Secondary | ICD-10-CM | POA: Insufficient documentation

## 2013-09-03 DIAGNOSIS — I10 Essential (primary) hypertension: Secondary | ICD-10-CM | POA: Insufficient documentation

## 2013-09-03 DIAGNOSIS — I420 Dilated cardiomyopathy: Secondary | ICD-10-CM

## 2013-09-03 DIAGNOSIS — R0602 Shortness of breath: Secondary | ICD-10-CM

## 2013-09-03 DIAGNOSIS — J45909 Unspecified asthma, uncomplicated: Secondary | ICD-10-CM | POA: Insufficient documentation

## 2013-09-03 DIAGNOSIS — E119 Type 2 diabetes mellitus without complications: Secondary | ICD-10-CM | POA: Insufficient documentation

## 2013-09-03 DIAGNOSIS — I428 Other cardiomyopathies: Secondary | ICD-10-CM | POA: Insufficient documentation

## 2013-09-03 HISTORY — PX: LEFT AND RIGHT HEART CATHETERIZATION WITH CORONARY ANGIOGRAM: SHX5449

## 2013-09-03 LAB — POCT I-STAT 3, ART BLOOD GAS (G3+)
Acid-base deficit: 1 mmol/L (ref 0.0–2.0)
Bicarbonate: 23.6 mEq/L (ref 20.0–24.0)
O2 Saturation: 94 %
TCO2: 25 mmol/L (ref 0–100)
pCO2 arterial: 37.5 mmHg (ref 35.0–45.0)
pH, Arterial: 7.407 (ref 7.350–7.450)
pO2, Arterial: 72 mmHg — ABNORMAL LOW (ref 80.0–100.0)

## 2013-09-03 LAB — POTASSIUM: Potassium: 3.6 mEq/L — ABNORMAL LOW (ref 3.7–5.3)

## 2013-09-03 LAB — GLUCOSE, CAPILLARY: Glucose-Capillary: 114 mg/dL — ABNORMAL HIGH (ref 70–99)

## 2013-09-03 LAB — POCT ACTIVATED CLOTTING TIME: Activated Clotting Time: 160 seconds

## 2013-09-03 LAB — POCT I-STAT 3, VENOUS BLOOD GAS (G3P V)
Acid-Base Excess: 1 mmol/L (ref 0.0–2.0)
Bicarbonate: 26.1 mEq/L — ABNORMAL HIGH (ref 20.0–24.0)
O2 Saturation: 66 %
TCO2: 27 mmol/L (ref 0–100)
pCO2, Ven: 41.3 mmHg — ABNORMAL LOW (ref 45.0–50.0)
pH, Ven: 7.408 — ABNORMAL HIGH (ref 7.250–7.300)
pO2, Ven: 34 mmHg (ref 30.0–45.0)

## 2013-09-03 SURGERY — LEFT AND RIGHT HEART CATHETERIZATION WITH CORONARY ANGIOGRAM
Anesthesia: LOCAL

## 2013-09-03 MED ORDER — FENTANYL CITRATE 0.05 MG/ML IJ SOLN
INTRAMUSCULAR | Status: AC
  Filled 2013-09-03: qty 2

## 2013-09-03 MED ORDER — SODIUM CHLORIDE 0.9 % IJ SOLN: 3.0000 mL | Freq: Two times a day (BID) | INTRAMUSCULAR | Status: DC

## 2013-09-03 MED ORDER — MIDAZOLAM HCL 2 MG/2ML IJ SOLN
INTRAMUSCULAR | Status: AC
  Filled 2013-09-03: qty 2

## 2013-09-03 MED ORDER — HEPARIN SODIUM (PORCINE) 1000 UNIT/ML IJ SOLN
INTRAMUSCULAR | Status: AC
  Filled 2013-09-03: qty 1

## 2013-09-03 MED ORDER — NITROGLYCERIN 0.2 MG/ML ON CALL CATH LAB
INTRAVENOUS | Status: AC
  Filled 2013-09-03: qty 1

## 2013-09-03 MED ORDER — SODIUM CHLORIDE 0.9 % IV SOLN
INTRAVENOUS | Status: DC
  Administered 2013-09-03: 10:00:00 via INTRAVENOUS

## 2013-09-03 MED ORDER — SODIUM CHLORIDE 0.9 % IV SOLN: INTRAVENOUS | Status: DC

## 2013-09-03 MED ORDER — HEPARIN (PORCINE) IN NACL 2-0.9 UNIT/ML-% IJ SOLN
INTRAMUSCULAR | Status: AC
  Filled 2013-09-03: qty 1000

## 2013-09-03 MED ORDER — LIDOCAINE HCL (PF) 1 % IJ SOLN
INTRAMUSCULAR | Status: AC
  Filled 2013-09-03: qty 30

## 2013-09-03 MED ORDER — HYDRALAZINE HCL 20 MG/ML IJ SOLN
INTRAMUSCULAR | Status: AC
  Filled 2013-09-03: qty 1

## 2013-09-03 MED ORDER — SODIUM CHLORIDE 0.9 % IJ SOLN: 3.0000 mL | INTRAMUSCULAR | Status: DC | PRN

## 2013-09-03 MED ORDER — ASPIRIN 81 MG PO CHEW
81.0000 mg | CHEWABLE_TABLET | ORAL | Status: AC
  Administered 2013-09-03: 81 mg via ORAL
  Filled 2013-09-03: qty 1

## 2013-09-03 MED ORDER — HYDRALAZINE HCL 20 MG/ML IJ SOLN
20.0000 mg | Freq: Once | INTRAMUSCULAR | Status: AC
  Administered 2013-09-03: 10 mg via INTRAVENOUS

## 2013-09-03 MED ORDER — SODIUM CHLORIDE 0.9 % IV SOLN: 250.0000 mL | INTRAVENOUS | Status: DC | PRN

## 2013-09-03 NOTE — Interval H&P Note (Signed)
History and Physical Interval Note:  09/03/2013 12:39 PM  [name]  has presented today for cardiac cath with the diagnosis of LV systolic dysfunction.  The various methods of treatment have been discussed with the patient and family. After consideration of risks, benefits and other options for treatment, the patient has consented to  Procedure(s): LEFT AND RIGHT HEART CATHETERIZATION WITH CORONARY ANGIOGRAM (N/A) as a surgical intervention .  The patient's history has been reviewed, patient examined, no change in status, stable for surgery.  I have reviewed the patient's chart and labs.  Questions were answered to the patient's satisfaction.    Cath Lab Visit (complete for each Cath Lab visit)  Clinical Evaluation Leading to the Procedure:   ACS: no  Non-ACS:    Anginal Classification: CCS II  Anti-ischemic medical therapy: No Therapy  Non-Invasive Test Results: No non-invasive testing performed  Prior CABG: No previous CABG    [name]

## 2013-09-03 NOTE — CV Procedure (Signed)
      Cardiac Catheterization Operative Report  [name] [id number] 5/27/20151:38 PM Redge Gainer, MD  Procedure Performed:  1. Left Heart Catheterization 2. Selective Coronary Angiography 3. Right Heart Catheterization 4. Left ventricular angiogram  Operator: Lauree Chandler, MD  Access: Right radial artery, right antecubital vein  Indication:  68 yo female with history of dyspnea and recently found to have severe reduction in LV systolic function.                               Procedure Details: The risks, benefits, complications, treatment options, and expected outcomes were discussed with the patient. The patient and/or family concurred with the proposed plan, giving informed consent. The patient was brought to the cath lab after IV hydration was begun and oral premedication was given. The patient was further sedated with Versed and Fentanyl. The right antecubital area and right wrist were prepped and draped in the usual manner. Using the modified Seldinger access technique, a 5 French sheath was placed in the right antecubital vein through the existing IV using sterile technique. A 5/6 French sheath was inserted into the right radial artery. 3 mg Verapamil was given through the sheath. A balloon tipped catheter was used to perform a right heart catheterization. 4000 Units IV heparin was given. Standard diagnostic catheters were used to perform selective coronary angiography. A pigtail catheter was used to perform a left ventricular angiogram. There were no immediate complications. The patient was taken to the recovery area in stable condition.   Hemodynamic Findings: Ao:  187/99           LV: 183/28/36 RA:  16          RV: 49/15/20 PA: 48/27 (35)        PCWP: 27 Fick Cardiac Output:  Fick Cardiac Index: Central Aortic Saturation: Pulmonary Artery Saturation:   Angiographic Findings:  Left main: No obstructive disease.   Left Anterior Descending Artery:  Large caliber vessel that courses to the apex. Moderate caliber diagonal branch. No obstructive.   Circumflex Artery: Large caliber vessel with two large caliber obtuse marginal branches. No obstructive disease.   Right Coronary Artery: Moderate caliber dominant vessel with no obstructive disease.   Left Ventricular Angiogram: LVEF=20%  Impression: 1. No angiographic evidence of CAD 2. Severe LV systolic dysfunction 3. Non-ischemic cardiomyopathy 4. Elevated filling pressures.   Recommendations: Medical management of non-ischemic cardiomyopathy. She is on an ARB. Will start Coreg 6.25 mg po BID. Will start Lasix 40 mg po Qdaily. She will need a BMET and close f/u in the office within next 5-7 days.        Complications:  None; patient tolerated the procedure well.

## 2013-09-03 NOTE — Discharge Instructions (Signed)

## 2013-09-03 NOTE — H&P (View-Only) (Signed)
HPI The patient presents for evaluation of an abnormal chest x-ray. She's also had dyspnea. This has been going on for about a month. She's had asthma but thinks this is different than that. She's had wheezing but even after the wheezing went away she was still short of breath. Her weights have been stable but she has had some increased edema. She's also describing orthopnea although she's not had PND. She's getting dyspneic with activity such as climbing a flight of stairs which has been chronic but getting worse. She's not describing chest pressure, neck or arm discomfort. She's not had any palpitations, presyncope or syncope. A chest x-ray recently demonstrated some mild vascular congestion and cardiomegaly.  Allergies  Allergen Reactions  . Penicillins     Current Outpatient Prescriptions  Medication Sig Dispense Refill  . albuterol (PROVENTIL HFA;VENTOLIN HFA) 108 (90 BASE) MCG/ACT inhaler Inhale 2 puffs into the lungs every 6 (six) hours as needed.  1 Inhaler  6  . clobetasol ointment (TEMOVATE) 2.35 % Apply 1 application topically 2 (two) times daily. As needed      . losartan-hydrochlorothiazide (HYZAAR) 50-12.5 MG per tablet Take 1 tablet by mouth daily.  90 tablet  0   Current Facility-Administered Medications  Medication Dose Route Frequency Provider Last Rate Last Dose  . levalbuterol (XOPENEX) nebulizer solution 1.25 mg  1.25 mg Nebulization Once Lysbeth Penner, FNP        Past Medical History  Diagnosis Date  . Asthma   . Hypertension   . GERD (gastroesophageal reflux disease)   . History of nephrolithiasis   . Headache(784.0)   . Type II or unspecified type diabetes mellitus without mention of complication, uncontrolled     Past Surgical History  Procedure Laterality Date  . Abdominal hysterectomy    . Oophorectomy      unilateral  . Ureteral extraction of kidney stone      Family History  Problem Relation Age of Onset  . Hypertension Mother   . Arthritis  Mother   . COPD Father   . Diabetes Neg Hx     History   Social History  . Marital Status: Divorced    Spouse Name: N/A    Number of Children: N/A  . Years of Education: N/A   Occupational History  . Not on file.   Social History Main Topics  . Smoking status: Never Smoker   . Smokeless tobacco: Never Used  . Alcohol Use: Yes     Comment: rare  . Drug Use: No  . Sexual Activity: Not on file   Other Topics Concern  . Not on file   Social History Narrative   HSG   Married "67-'8, divorced; remained single   1 payne -'15; 1 son -'24: two grandchildren   Unemployed   Lives alone- sruggle $$    ROS:   A positive for dizziness.  Other  PHYSICAL EXAM BP 140/72  Pulse 97  Ht 5\' 4"  (1.626 m)  Wt 184 lb (83.462 kg)  BMI 31.57 kg/m2 GENERAL:  Well appearing HEENT:  Pupils equal round and reactive, fundi not visualized, oral mucosa unremarkable NECK:  Positive jugular venous distention at 6 cm, waveform within normal limits, carotid upstroke brisk and symmetric, no bruits, no thyromegaly LYMPHATICS:  No cervical, inguinal adenopathy LUNGS:  Clear to auscultation bilaterally BACK:  No CVA tenderness CHEST:  Unremarkable HEART:  PMI not displaced or sustained,S1 and S2 within normal limits, no S3, no S4, no clicks, no  rubs, no murmurs ABD:  Flat, positive bowel sounds normal in frequency in pitch, no bruits, no rebound, no guarding, no midline pulsatile mass, no hepatomegaly, no splenomegaly EXT:  2 plus pulses throughout, trace edema, no cyanosis no clubbing SKIN:  No rashes no nodules NEURO:  Cranial nerves II through XII grossly intact, motor grossly intact throughout PSYCH:  Cognitively intact, oriented to person place and time   EKG:  Sinus rhythm, rate 97, left axis deviation, left ventricle hypertrophy, incomplete right bundle branch block, poor anterior R wave progression, QTC prolonged, no acute ST-T wave changes. PVCs. 08/21/2013  ASSESSMENT AND PLAN  DYSPNEA:   I am concerned about possible cardiac etiology. I am going to start with a BNP level and an echocardiogram. If these are unremarkable I will pursue stress testing. Because of dyspnea she would not be a walk on a treadmill. Therefore, she will have a [employer].  HTN:  For now she will continue on the meds as listed. Her

## 2013-09-12 ENCOUNTER — Encounter: Payer: Medicare Other | Admitting: Physician Assistant

## 2013-09-17 ENCOUNTER — Ambulatory Visit (INDEPENDENT_AMBULATORY_CARE_PROVIDER_SITE_OTHER): Payer: Medicare Other | Admitting: Physician Assistant

## 2013-09-17 ENCOUNTER — Encounter: Payer: Self-pay | Admitting: Physician Assistant

## 2013-09-17 VITALS — BP 142/80 | HR 80 | Ht 64.0 in | Wt 182.0 lb

## 2013-09-17 DIAGNOSIS — I5022 Chronic systolic (congestive) heart failure: Secondary | ICD-10-CM | POA: Insufficient documentation

## 2013-09-17 DIAGNOSIS — I428 Other cardiomyopathies: Secondary | ICD-10-CM

## 2013-09-17 DIAGNOSIS — I10 Essential (primary) hypertension: Secondary | ICD-10-CM

## 2013-09-17 MED ORDER — CARVEDILOL 6.25 MG PO TABS: 3.1250 mg | ORAL_TABLET | Freq: Two times a day (BID) | ORAL | Status: DC

## 2013-09-17 NOTE — Patient Instructions (Addendum)
Your physician has recommended you make the following change in your medication:  1. DECREASE COREG TO 3.125 MG TWICE DAILY (1/2 TAB TWICE DAILY)  LAB WORK TODAY; BMET  Your physician recommends that you schedule a follow-up appointment on 10/07/13 @ 12:10 WITH [facility]

## 2013-09-17 NOTE — Progress Notes (Signed)
Cardiology Office Note    Date:  09/17/2013   ID:  GIFT RUECKERT, DOB November 22, 1945, MRN [id number]  PCP:  Redge Gainer, MD  Cardiologist:  Dr. Minus Breeding      History of Present Illness: [name] is a 68 y.o. female with a history of HTN, diabetes, GERD and asthma. She was recently evaluated by Dr. Percival Spanish for dyspnea. Echocardiogram was obtained and this demonstrated significantly reduced LV function with an EF of 25-30%. Cardiac catheterization was arranged. This demonstrated no CAD. She was felt to have a nonischemic cardiomyopathy. Beta blocker and Lasix was started at the time of her cardiac catheterization. She returns for follow up.  She c/o dizziness since her LHC.  She thinks it is related to the Coreg.  She denies syncope.  She notes dyspnea with more moderate activities.  She is NYHA 2b.  She sleeps on 2 pillows chronically.  She denies PND.  She continues to note some chest tightness with exertion.  This is overall improved.  She denies edema.     Studies:  - R/LHC (09/03/13):  Ao: 187/99, LV: 183/28/36, RA: 16, RV: 49/15/20, PA: 48/27 (35), PCWP: 27.  Left main: No obstructive disease.  LAD: Large caliber vessel that courses to the apex. Moderate caliber diagonal branch. No obstructive. CFX: Large caliber vessel with two large caliber obtuse marginal branches. No obstructive disease.  RCA: Moderate caliber dominant vessel with no obstructive disease. LVEF=20%   - Echo (08/26/13):  Mild LVH. EF 25% to 30%. Diffuse HK. Aortic valve: There was trivial regurgitation. Mitral valve: There was mild regurgitation. Left atrium: The atrium was mildly dilated.   Recent Labs: 08/21/2013: Pro B Natriuretic peptide (BNP) 160.0*  09/02/2013: Creatinine 0.9; Hemoglobin 12.6  09/03/2013: Potassium 3.6*   Wt Readings from Last 3 Encounters:  09/17/13 182 lb (82.555 kg)  09/03/13 180 lb (81.647 kg)  09/03/13 180 lb (81.647 kg)     Past Medical History  Diagnosis Date  .  Asthma   . Hypertension   . GERD (gastroesophageal reflux disease)   . History of nephrolithiasis   . Headache(784.0)   . Type II or unspecified type diabetes mellitus without mention of complication, uncontrolled     borderline    Current Outpatient Prescriptions  Medication Sig Dispense Refill  . albuterol (PROVENTIL HFA;VENTOLIN HFA) 108 (90 BASE) MCG/ACT inhaler Inhale 2 puffs into the lungs every 6 (six) hours as needed.  1 Inhaler  6  . carvedilol (COREG) 6.25 MG tablet Take 6.25 mg by mouth 2 (two) times daily with a meal.       . furosemide (LASIX) 40 MG tablet Take 40 mg by mouth daily.       Marland Kitchen losartan (COZAAR) 100 MG tablet Take 100 mg by mouth daily.        Current Facility-Administered Medications  Medication Dose Route Frequency Provider Last Rate Last Dose  . levalbuterol (XOPENEX) nebulizer solution 1.25 mg  1.25 mg Nebulization Once Lysbeth Penner, FNP        Allergies:   Penicillins   Social History:  The patient  reports that she has never smoked. She has never used smokeless tobacco. She reports that she drinks alcohol. She reports that she does not use illicit drugs.   Family History:  The patient's family history includes Arthritis in her mother; CAD (age of onset: 41) in her mother; COPD in her father; Hypertension in her mother. There is no history of Diabetes.  ROS:  Please see the history of present illness.      All other systems reviewed and negative.   PHYSICAL EXAM: VS:  BP 142/80  Pulse 80  Ht 5\' 4"  (1.626 m)  Wt 182 lb (82.555 kg)  BMI 31.22 kg/m2 Well nourished, well developed, in no acute distress HEENT: normal Neck: no JVD Cardiac:  normal S1, S2; RRR; no murmur Lungs:  clear to auscultation bilaterally, no wheezing, rhonchi or rales Abd: soft, nontender, no hepatomegaly Ext: trace bilateral LE edemaright wrist without hematoma or mass  Skin: warm and dry Neuro:  CNs 2-12 intact, no focal abnormalities noted  EKG:  NSR, HR 80, LAD, T  wave inversions in V4-V6     ASSESSMENT AND PLAN:  1. NICM (nonischemic cardiomyopathy):  Continue ARB.  She is likely having an intolerance to Coreg.  I will reduce Coreg to 3.125 BID for now.  If this does not help her symptoms, consider changing to Bisoprolol.  Obtain BMET today.  If K+ and creatinine ok, add Spironolactone with close follow up on renal function and K+.   2. Chronic systolic heart failure:  Volume appears stable.  Check BMET today.  Try to add Spironolactone as noted.   3. HYPERTENSION:  Fair control.  Will try to add Spironolactone.  Adjust beta blocker as able. 4. Disposition:  F/u with me in 2-3 weeks.    Signed, Versie Starks, MHS 09/17/2013 3:53 PM    [facility] [place], [place], [place]  [address] Phone: [phone]; Fax: [phone]

## 2013-09-18 ENCOUNTER — Telehealth: Payer: Self-pay | Admitting: *Deleted

## 2013-09-18 LAB — BASIC METABOLIC PANEL
BUN: 20 mg/dL (ref 6–23)
CO2: 29 mEq/L (ref 19–32)
Calcium: 9.4 mg/dL (ref 8.4–10.5)
Chloride: 107 mEq/L (ref 96–112)
Creatinine, Ser: 0.9 mg/dL (ref 0.4–1.2)
GFR: 65.16 mL/min (ref >60.00)
Glucose, Bld: 161 mg/dL — ABNORMAL HIGH (ref 70–99)
Potassium: 3.1 mEq/L — ABNORMAL LOW (ref 3.5–5.1)
Sodium: 143 mEq/L (ref 135–145)

## 2013-09-18 NOTE — Telephone Encounter (Signed)
lmptcb for lab results and med change w/repeat labs

## 2013-09-19 ENCOUNTER — Other Ambulatory Visit: Payer: Self-pay | Admitting: *Deleted

## 2013-09-19 DIAGNOSIS — I10 Essential (primary) hypertension: Secondary | ICD-10-CM

## 2013-09-19 MED ORDER — SPIRONOLACTONE 25 MG PO TABS: 25.0000 mg | ORAL_TABLET | Freq: Every day | ORAL | Status: DC

## 2013-09-19 NOTE — Telephone Encounter (Signed)
Follow up     Returning a nurses call for lab results

## 2013-09-19 NOTE — Telephone Encounter (Signed)
Notified of lab results.  Will start on Spironolactone 25 mg today.  Will get BMET on 6/15, 6/22 and 6/30 and also will see Scott on 6/30.

## 2013-09-22 ENCOUNTER — Encounter: Payer: Self-pay | Admitting: Physician Assistant

## 2013-09-22 ENCOUNTER — Other Ambulatory Visit: Payer: Medicare Other

## 2013-09-29 ENCOUNTER — Telehealth: Payer: Self-pay | Admitting: *Deleted

## 2013-09-29 ENCOUNTER — Other Ambulatory Visit (INDEPENDENT_AMBULATORY_CARE_PROVIDER_SITE_OTHER): Payer: Medicare Other

## 2013-09-29 DIAGNOSIS — I10 Essential (primary) hypertension: Secondary | ICD-10-CM

## 2013-09-29 LAB — BASIC METABOLIC PANEL
BUN: 22 mg/dL (ref 6–23)
CO2: 31 mEq/L (ref 19–32)
Calcium: 9.3 mg/dL (ref 8.4–10.5)
Chloride: 105 mEq/L (ref 96–112)
Creatinine, Ser: 1 mg/dL (ref 0.4–1.2)
GFR: 57.12 mL/min — ABNORMAL LOW (ref >60.00)
Glucose, Bld: 301 mg/dL — ABNORMAL HIGH (ref 70–99)
Potassium: 3.1 mEq/L — ABNORMAL LOW (ref 3.5–5.1)
Sodium: 141 mEq/L (ref 135–145)

## 2013-09-29 MED ORDER — POTASSIUM CHLORIDE CRYS ER 20 MEQ PO TBCR: EXTENDED_RELEASE_TABLET | ORAL | Status: DC

## 2013-09-29 NOTE — Telephone Encounter (Signed)
lmptcb for lab results and med change, labs shown to DOD Dr. Marlou Porch who recommends to give K+ 20 QD x 3 days, will re-check bmet on 6/30 at ov w/SW, PA.

## 2013-09-29 NOTE — Telephone Encounter (Signed)
Tried pt once more before leaving for the evening. Lmom Rx K+ 20 meq sent to pharmacy tonight and if she could start on K+ tonight that would be good and I will cb tomorrow to go over lab results, K+ 3.1.

## 2013-09-30 NOTE — Telephone Encounter (Signed)
ptcb and has been notified about her lab results. She did tell me that she got my message last night about picking up the K+ 20 meq and she did get it last and started last night. Pt has a f/u on 6/30 w/Scott W. PA, repeat bmet then, pt said ok.

## 2013-09-30 NOTE — Telephone Encounter (Signed)
Patient is returning your call. Please call back.  °

## 2013-10-01 ENCOUNTER — Institutional Professional Consult (permissible substitution): Payer: Medicare Other | Admitting: Cardiology

## 2013-10-06 ENCOUNTER — Encounter: Payer: Medicare Other | Admitting: Physician Assistant

## 2013-10-07 ENCOUNTER — Encounter: Payer: Self-pay | Admitting: Physician Assistant

## 2013-10-07 ENCOUNTER — Other Ambulatory Visit: Payer: Medicare Other

## 2013-10-07 ENCOUNTER — Ambulatory Visit: Payer: Medicare Other | Admitting: Physician Assistant

## 2013-10-07 ENCOUNTER — Telehealth: Payer: Self-pay | Admitting: *Deleted

## 2013-10-07 ENCOUNTER — Ambulatory Visit (INDEPENDENT_AMBULATORY_CARE_PROVIDER_SITE_OTHER): Payer: Medicare Other | Admitting: Physician Assistant

## 2013-10-07 VITALS — BP 140/80 | HR 91 | Ht 64.0 in | Wt 182.0 lb

## 2013-10-07 DIAGNOSIS — R0602 Shortness of breath: Secondary | ICD-10-CM

## 2013-10-07 DIAGNOSIS — I428 Other cardiomyopathies: Secondary | ICD-10-CM

## 2013-10-07 DIAGNOSIS — I5022 Chronic systolic (congestive) heart failure: Secondary | ICD-10-CM

## 2013-10-07 DIAGNOSIS — I10 Essential (primary) hypertension: Secondary | ICD-10-CM

## 2013-10-07 DIAGNOSIS — E876 Hypokalemia: Secondary | ICD-10-CM

## 2013-10-07 DIAGNOSIS — R079 Chest pain, unspecified: Secondary | ICD-10-CM

## 2013-10-07 LAB — BASIC METABOLIC PANEL
BUN: 22 mg/dL (ref 6–23)
CO2: 26 mEq/L (ref 19–32)
Calcium: 9.6 mg/dL (ref 8.4–10.5)
Chloride: 104 mEq/L (ref 96–112)
Creatinine, Ser: 1.1 mg/dL (ref 0.4–1.2)
GFR: 54.05 mL/min — ABNORMAL LOW (ref >60.00)
Glucose, Bld: 260 mg/dL — ABNORMAL HIGH (ref 70–99)
Potassium: 3.2 mEq/L — ABNORMAL LOW (ref 3.5–5.1)
Sodium: 140 mEq/L (ref 135–145)

## 2013-10-07 LAB — BRAIN NATRIURETIC PEPTIDE: Pro B Natriuretic peptide (BNP): 67 pg/mL (ref 0.0–100.0)

## 2013-10-07 MED ORDER — BISOPROLOL FUMARATE 5 MG PO TABS: 5.0000 mg | ORAL_TABLET | Freq: Every day | ORAL | Status: DC

## 2013-10-07 MED ORDER — FAMOTIDINE 20 MG PO TABS: 20.0000 mg | ORAL_TABLET | Freq: Every day | ORAL | Status: DC

## 2013-10-07 NOTE — Progress Notes (Signed)
Cardiology Office Note    Date:  10/07/2013   ID:  FRANCHON KETTERMAN, DOB 09-Mar-1946, MRN [id number]  PCP:  Redge Gainer, MD  Cardiologist:  Dr. Minus Breeding      History of Present Illness: [name] is a 68 y.o. female with a history of HTN, diabetes, GERD and asthma. She was recently evaluated by Dr. Percival Spanish for dyspnea. Echocardiogram was obtained and this demonstrated significantly reduced LV function with an EF of 25-30%. Cardiac catheterization demonstrated no CAD. She was dx with a nonischemic cardiomyopathy.  I saw her in f/u 09/17/13.  She complained of dizziness.  I reduced the dose of her Coreg.  K+ was low on labs and I added Spironolactone to her regimen.  F/u K+ was low and she needs f/u BMET.  She returns for follow up.    She continues to note chest discomfort with lying flat.  She continues to note NYHA class 2b dyspnea.  She denies orthopnea, PND, edema.  Denies syncope.  She denies chest pain related to meals.  She does eat late and does admit to some ? Odynophagia.  Denies cough or wheezing.     Studies:  - R/LHC (09/03/13):  Ao: 187/99, LV: 183/28/36, RA: 16, RV: 49/15/20, PA: 48/27 (35), PCWP: 27.  No CAD.   LVEF=20%   - Echo (08/26/13):  Mild LVH. EF 25% to 30%. Diffuse HK. Aortic valve: There was trivial regurgitation. Mitral valve: There was mild regurgitation. Left atrium: The atrium was mildly dilated.   Recent Labs: 08/21/2013: Pro B Natriuretic peptide (BNP) 160.0*  09/02/2013: Hemoglobin 12.6  09/29/2013: Creatinine 1.0; Potassium 3.1*   Wt Readings from Last 3 Encounters:  10/07/13 182 lb (82.555 kg)  09/17/13 182 lb (82.555 kg)  09/03/13 180 lb (81.647 kg)     Past Medical History  Diagnosis Date  . Asthma   . Hypertension   . GERD (gastroesophageal reflux disease)   . History of nephrolithiasis   . Headache(784.0)   . Type II or unspecified type diabetes mellitus without mention of complication, uncontrolled     borderline  . NICM  (nonischemic cardiomyopathy)     a. LHC (08/2013):  no CAD  . Chronic systolic heart failure     Echo (08/26/13):  Mild LVH. EF 25% to 30%. Diffuse HK. Aortic valve: There was trivial regurgitation. Mitral valve: There was mild regurgitation. Left atrium: The atrium was mildly dilated.    Current Outpatient Prescriptions  Medication Sig Dispense Refill  . albuterol (PROVENTIL HFA;VENTOLIN HFA) 108 (90 BASE) MCG/ACT inhaler Inhale 2 puffs into the lungs every 6 (six) hours as needed.  1 Inhaler  6  . carvedilol (COREG) 6.25 MG tablet Take 0.5 tablets (3.125 mg total) by mouth 2 (two) times daily with a meal.      . furosemide (LASIX) 40 MG tablet Take 40 mg by mouth daily.       Marland Kitchen levalbuterol (XOPENEX HFA) 45 MCG/ACT inhaler Inhale 1 puff into the lungs every 6 (six) hours as needed for wheezing.      Marland Kitchen losartan (COZAAR) 100 MG tablet Take 100 mg by mouth daily.       . potassium chloride SA (K-DUR,KLOR-CON) 20 MEQ tablet Take 1 tablet daily for 3 days then stop  3 tablet  0  . spironolactone (ALDACTONE) 25 MG tablet Take 1 tablet (25 mg total) by mouth daily.  30 tablet  11   No current facility-administered medications for this visit.  Allergies:   Penicillins   Social History:  The patient  reports that she has never smoked. She has never used smokeless tobacco. She reports that she drinks alcohol. She reports that she does not use illicit drugs.   Family History:  The patient's family history includes Arthritis in her mother; CAD (age of onset: 17) in her mother; COPD in her father; Hypertension in her mother. There is no history of Diabetes.   ROS:  Please see the history of present illness.     All other systems reviewed and negative.   PHYSICAL EXAM: VS:  BP 140/80  Pulse 91  Ht 5\' 4"  (1.626 m)  Wt 182 lb (82.555 kg)  BMI 31.22 kg/m2 Well nourished, well developed, in no acute distress HEENT: normal Neck: no JVD Cardiac:  normal S1, S2; RRR; 6-2/8 systolic murmur  RUSB Lungs:  clear to auscultation bilaterally, no wheezing, rhonchi or rales Abd: soft, nontender, no hepatomegaly Ext: trace bilateral LE edema  Skin: warm and dry Neuro:  CNs 2-12 intact, no focal abnormalities noted  EKG:  NSR, HR 91, LAD, inc RBBB, NSSTTW changes    ASSESSMENT AND PLAN:  1. NICM (nonischemic cardiomyopathy):  Overall, I do not think she tolerates the Carvedilol.  Question if this is related to her asthma.  I will stop her Carvedilol and start Bisoprolol 5 mg QD.  Continue ARB, spironolactone.  Check f/u BMET today.  Eventually repeat echo (? September).  Refer to EP if EF < 35 % for +/- ICD.   2. Chest pain:  Question if this is related to GERD.  Add Pepcid 20 mg QHS.   3. Chronic systolic heart failure:  Volume appears stable.  Check BNP.  Increase Lasix if BNP is increased.  4. Hypokalemia:  Repeat BMET today.  If K+ low, resume K+ supplementation.  5. HYPERTENSION:  Borderline control.  Adjust medications as noted.   6. Disposition:  F/u with Dr. Minus Breeding in 1 month.    Signed, Versie Starks, MHS 10/07/2013 3:09 PM    [facility] [place], [place], [place]  [address] Phone: [phone]; Fax: [phone]

## 2013-10-07 NOTE — Patient Instructions (Signed)
STOP COREG  START BISOPROLOL 5 MG 1 TABLET DAILY; RX SENT IN TODAY  LAB WORK TODAY; BMET, BNP  PLEASE FOLLOW UP WITH DR. HOCHREIN IN 1 MONTH

## 2013-10-07 NOTE — Telephone Encounter (Signed)
lmptcb for lab results and med change

## 2013-10-08 MED ORDER — POTASSIUM CHLORIDE CRYS ER 20 MEQ PO TBCR: EXTENDED_RELEASE_TABLET | ORAL | Status: DC

## 2013-10-08 NOTE — Telephone Encounter (Signed)
Message copied by Michae Kava on Wed Oct 08, 2013  8:53 AM ------      Message from: [place], [place] T      Created: Tue Oct 07, 2013  5:51 PM       BNP normal      Continue current dose of Lasix and Spironolactone      K+ low      Take K+ 40 mEq x 1, then continue with K+ 20 mEq QD.      Repeat BMET in 1 week.      Richardson Dopp, PA-C        10/07/2013 5:50 PM ------

## 2013-10-08 NOTE — Telephone Encounter (Signed)
pt notified about lab results and med change for K+ with repeat bmet 7/8.

## 2013-11-18 ENCOUNTER — Ambulatory Visit (INDEPENDENT_AMBULATORY_CARE_PROVIDER_SITE_OTHER): Payer: Medicare Other | Admitting: Cardiology

## 2013-11-18 ENCOUNTER — Encounter: Payer: Self-pay | Admitting: Cardiology

## 2013-11-18 ENCOUNTER — Other Ambulatory Visit: Payer: Self-pay | Admitting: *Deleted

## 2013-11-18 VITALS — BP 183/89 | HR 70 | Ht 64.0 in | Wt 186.9 lb

## 2013-11-18 DIAGNOSIS — I10 Essential (primary) hypertension: Secondary | ICD-10-CM

## 2013-11-18 DIAGNOSIS — R5381 Other malaise: Secondary | ICD-10-CM

## 2013-11-18 DIAGNOSIS — R0609 Other forms of dyspnea: Secondary | ICD-10-CM

## 2013-11-18 DIAGNOSIS — R0989 Other specified symptoms and signs involving the circulatory and respiratory systems: Secondary | ICD-10-CM

## 2013-11-18 DIAGNOSIS — R5382 Chronic fatigue, unspecified: Secondary | ICD-10-CM

## 2013-11-18 DIAGNOSIS — R0600 Dyspnea, unspecified: Secondary | ICD-10-CM

## 2013-11-18 DIAGNOSIS — R5383 Other fatigue: Secondary | ICD-10-CM

## 2013-11-18 MED ORDER — SPIRONOLACTONE 50 MG PO TABS: 50.0000 mg | ORAL_TABLET | Freq: Every day | ORAL | Status: DC

## 2013-11-18 NOTE — Progress Notes (Signed)
HPI  The patient presents for follow up of dyspnea.  Was found to have an EF of 25%.  Cardiac catheterization demonstrated no coronary disease (a nonischemic etiology.  Since I have seen her she has seen [employer] [facility].  She has dizziness and he did not think that she tolerated carvedilol. She was switched to bisoprolol. Because of low potassium she was started on spironolactone.  Unfortunately she continues to do poorly. She may be a little less short of breath than previous. Her weights are actually up some. She however continues to be fatigued and perhaps more so. It might be slightly better on bisoprolol compared to carvedilol it is difficult to tell. She's not describing any chest pressure, neck or arm discomfort. She's not describing any palpitations, presyncope or syncope. She has had no new PND or orthopnea. However, she does snore.  Allergies  Allergen Reactions  . Penicillins Rash    Current Outpatient Prescriptions  Medication Sig Dispense Refill  . albuterol (PROVENTIL HFA;VENTOLIN HFA) 108 (90 BASE) MCG/ACT inhaler Inhale 2 puffs into the lungs every 6 (six) hours as needed.  1 Inhaler  6  . bisoprolol (ZEBETA) 5 MG tablet Take 1 tablet (5 mg total) by mouth daily.  30 tablet  11  . furosemide (LASIX) 40 MG tablet Take 40 mg by mouth daily.       Marland Kitchen levalbuterol (XOPENEX HFA) 45 MCG/ACT inhaler Inhale 1 puff into the lungs every 6 (six) hours as needed for wheezing.      Marland Kitchen losartan (COZAAR) 100 MG tablet Take 100 mg by mouth daily.       . potassium chloride SA (K-DUR,KLOR-CON) 20 MEQ tablet Today 10/08/13 take 2 tabs = 40 meq; start on 10/09/13 taking 1 tab daily  35 tablet  11  . spironolactone (ALDACTONE) 25 MG tablet Take 1 tablet (25 mg total) by mouth daily.  30 tablet  11   No current facility-administered medications for this visit.    Past Medical History  Diagnosis Date  . Asthma   . Hypertension   . GERD (gastroesophageal reflux disease)   . History of  nephrolithiasis   . Headache(784.0)   . Type II or unspecified type diabetes mellitus without mention of complication, uncontrolled     borderline  . NICM (nonischemic cardiomyopathy)     a. LHC (08/2013):  no CAD  . Chronic systolic heart failure     Echo (08/26/13):  Mild LVH. EF 25% to 30%. Diffuse HK. Aortic valve: There was trivial regurgitation. Mitral valve: There was mild regurgitation. Left atrium: The atrium was mildly dilated.    Past Surgical History  Procedure Laterality Date  . Abdominal hysterectomy    . Oophorectomy      unilateral  . Ureteral extraction of kidney stone      ROS:  Otherwise as stated in the HPI and negative for all other systems.  PHYSICAL EXAM BP 183/89  Pulse 70  Ht 5\' 4"  (1.626 m)  Wt 186 lb 14.4 oz (84.777 kg)  BMI 32.07 kg/m2 GENERAL:  Well appearing HEENT:  Pupils equal round and reactive, fundi not visualized, oral mucosa unremarkable NECK:  Positive jugular venous distention at 6 cm, waveform within normal limits, carotid upstroke brisk and symmetric, no bruits, no thyromegaly LYMPHATICS:  No cervical, inguinal adenopathy LUNGS:  Clear to auscultation bilaterally BACK:  No CVA tenderness CHEST:  Unremarkable HEART:  PMI not displaced or sustained,S1 and S2 within normal limits, no S3, no S4, no  clicks, no rubs, no murmurs ABD:  Flat, positive bowel sounds normal in frequency in pitch, no bruits, no rebound, no guarding, no midline pulsatile mass, no hepatomegaly, no splenomegaly EXT:  2 plus pulses throughout, mild diffuse edema, no cyanosis no clubbing SKIN:  No rashes no nodules NEURO:  Cranial nerves II through XII grossly intact, motor grossly intact throughout PSYCH:  Cognitively intact, oriented to person place and time  EKG:  Sinus rhythm, rate 70, left axis deviation, poor anterior R wave progression,  Diffuse T wave inversion unchanged from previous.  11/18/2013  ASSESSMENT AND PLAN  CARDIOMYOPATHY:  The patient does not seem  to be much better with medical therapy. Her blood pressure seems to be elevated. I'm going to increase her spironolactone. I will check a basic metabolic profile today. I will follow guidelines for spironolactone follow up in heart failure.  (Check potassium levels and  renal function 3--4 days and 1 week, then at least monthly for first 3 months and every 3 months thereafter after initiation of spironolactone.)  Because fatigue is a significant component will also be checking a TSH. As below we screen her for sleep apnea. She may eventually need advanced therapies if she remains symptomatic.  She needs 48 pounds restriction in 2 g sodium. She needs daily weights. I may also prescribed other diuretics.  DYSPNEA:   See above.    HTN:  This is being managed in the context of treating his CHF.  She will however, be keeping a BP diary.    SLEEP APNEA:  It turns out that she had this diagnosis years ago but couldn't wear CPAP.  I will order a sleep study.

## 2013-11-18 NOTE — Patient Instructions (Signed)
Your physician recommends that you schedule a follow-up appointment in: 2 weeks with Dr. Percival Spanish or an extender  We are ordering a sleep study  We have ordered some bloodwork for you to get done today  We are increaseing your spironolactone to 50 mg daily  Maintain a 48 oz fluid restriction  And follow a 2 gm sodium diet

## 2013-11-19 LAB — BASIC METABOLIC PANEL
BUN: 17 mg/dL (ref 6–23)
CO2: 26 mEq/L (ref 19–32)
Calcium: 9.4 mg/dL (ref 8.4–10.5)
Chloride: 105 mEq/L (ref 96–112)
Creat: 0.86 mg/dL (ref 0.50–1.10)
Glucose, Bld: 149 mg/dL — ABNORMAL HIGH (ref 70–99)
Potassium: 4.2 mEq/L (ref 3.5–5.3)
Sodium: 141 mEq/L (ref 135–145)

## 2013-11-19 LAB — TSH: TSH: 2.513 u[IU]/mL (ref 0.350–4.500)

## 2013-11-20 ENCOUNTER — Other Ambulatory Visit: Payer: Self-pay | Admitting: *Deleted

## 2013-11-20 DIAGNOSIS — G4700 Insomnia, unspecified: Secondary | ICD-10-CM

## 2013-11-20 DIAGNOSIS — Z9189 Other specified personal risk factors, not elsewhere classified: Secondary | ICD-10-CM

## 2013-11-20 DIAGNOSIS — G4719 Other hypersomnia: Secondary | ICD-10-CM

## 2013-11-20 NOTE — Progress Notes (Signed)
Pt. Informed of her labs

## 2013-12-03 ENCOUNTER — Other Ambulatory Visit: Payer: Medicare Other

## 2013-12-03 DIAGNOSIS — I5022 Chronic systolic (congestive) heart failure: Secondary | ICD-10-CM

## 2013-12-03 DIAGNOSIS — I428 Other cardiomyopathies: Secondary | ICD-10-CM

## 2013-12-03 DIAGNOSIS — I10 Essential (primary) hypertension: Secondary | ICD-10-CM

## 2013-12-04 LAB — BMP8+EGFR
BUN/Creatinine Ratio: 21 (ref 11–26)
BUN: 21 mg/dL (ref 8–27)
CO2: 22 mmol/L (ref 18–29)
Calcium: 9.7 mg/dL (ref 8.7–10.3)
Chloride: 101 mmol/L (ref 97–108)
Creatinine, Ser: 1.01 mg/dL — ABNORMAL HIGH (ref 0.57–1.00)
GFR calc Af Amer: 66 mL/min/{1.73_m2} (ref >59)
GFR calc non Af Amer: 57 mL/min/{1.73_m2} — ABNORMAL LOW (ref >59)
Glucose: 181 mg/dL — ABNORMAL HIGH (ref 65–99)
Potassium: 4.5 mmol/L (ref 3.5–5.2)
Sodium: 141 mmol/L (ref 134–144)

## 2013-12-05 ENCOUNTER — Encounter: Payer: Self-pay | Admitting: Physician Assistant

## 2013-12-05 ENCOUNTER — Ambulatory Visit (INDEPENDENT_AMBULATORY_CARE_PROVIDER_SITE_OTHER): Payer: Medicare Other | Admitting: Physician Assistant

## 2013-12-05 VITALS — BP 136/90 | HR 80 | Ht 64.0 in | Wt 185.6 lb

## 2013-12-05 DIAGNOSIS — I10 Essential (primary) hypertension: Secondary | ICD-10-CM

## 2013-12-05 DIAGNOSIS — I5022 Chronic systolic (congestive) heart failure: Secondary | ICD-10-CM

## 2013-12-05 DIAGNOSIS — I509 Heart failure, unspecified: Secondary | ICD-10-CM

## 2013-12-05 DIAGNOSIS — I428 Other cardiomyopathies: Secondary | ICD-10-CM

## 2013-12-05 NOTE — Patient Instructions (Addendum)
1.  Stop potassium. 2.  Follow up with Dr. Percival Spanish in 3 months.   3.  Continue with sleep study. 4   labs on Sept 26 and Oct 26 and Nov 26

## 2013-12-10 ENCOUNTER — Encounter: Payer: Self-pay | Admitting: Physician Assistant

## 2013-12-10 NOTE — Assessment & Plan Note (Signed)
Continue current meds 

## 2013-12-10 NOTE — Assessment & Plan Note (Addendum)
She appears euvolemic ....  BMET shows potassium is 4.5 and up from prior lab after starting spironolactone.  I asked her to stop the potassium supplements.  Follow up labs on Sept 26 and Oct 26 and Nov 26.  She appears euvolemic.  Follow up with Dr. Percival Spanish in 3 months.  TSH on 11/18/13 was WNL.

## 2013-12-10 NOTE — Progress Notes (Signed)
Date:  12/10/2013   ID:  [name], DOB 1945/09/13, MRN [id number]  PCP:  Redge Gainer, MD  Primary Cardiologist:  Percival Spanish     History of Present Illness: [name] is a 68 y.o. female with a history of HTN, diabetes, GERD and asthma. She was recently evaluated by Dr. Percival Spanish for dyspnea. Echocardiogram was obtained and this demonstrated significantly reduced LV function with an EF of 25-30%. Cardiac catheterization demonstrated no CAD. She was dx with a nonischemic cardiomyopathy.  Earlier this Summer lasix was discontinued and she was started on spironolactone, potassium and coreg was changed to bisoprolol.  Follow up BMET shows a potassium of 4.5.  Her weight is stable.    The patient currently denies nausea, vomiting, fever, chest pain, shortness of breath, orthopnea, dizziness, PND, cough, congestion, abdominal pain, hematochezia, melena, lower extremity edema, claudication.  Wt Readings from Last 3 Encounters:  12/05/13 185 lb 9.6 oz (84.188 kg)  11/18/13 186 lb 14.4 oz (84.777 kg)  10/07/13 182 lb (82.555 kg)     Past Medical History  Diagnosis Date  . Asthma   . Hypertension   . GERD (gastroesophageal reflux disease)   . History of nephrolithiasis   . Headache(784.0)   . Type II or unspecified type diabetes mellitus without mention of complication, uncontrolled     borderline  . NICM (nonischemic cardiomyopathy)     a. LHC (08/2013):  no CAD  . Chronic systolic heart failure     Echo (08/26/13):  Mild LVH. EF 25% to 30%. Diffuse HK. Aortic valve: There was trivial regurgitation. Mitral valve: There was mild regurgitation. Left atrium: The atrium was mildly dilated.    Current Outpatient Prescriptions  Medication Sig Dispense Refill  . albuterol (PROVENTIL HFA;VENTOLIN HFA) 108 (90 BASE) MCG/ACT inhaler Inhale 2 puffs into the lungs every 6 (six) hours as needed.  1 Inhaler  6  . bisoprolol (ZEBETA) 5 MG tablet Take 1 tablet (5 mg total) by mouth daily.   30 tablet  11  . levalbuterol (XOPENEX HFA) 45 MCG/ACT inhaler Inhale 1 puff into the lungs every 6 (six) hours as needed for wheezing.      Marland Kitchen losartan (COZAAR) 100 MG tablet Take 100 mg by mouth daily.       Marland Kitchen spironolactone (ALDACTONE) 50 MG tablet Take 1 tablet (50 mg total) by mouth daily.  30 tablet  11   No current facility-administered medications for this visit.    Allergies:    Allergies  Allergen Reactions  . Penicillins Rash    Social History:  The patient  reports that she has never smoked. She has never used smokeless tobacco. She reports that she drinks alcohol. She reports that she does not use illicit drugs.   Family history:   Family History  Problem Relation Age of Onset  . Hypertension Mother   . Arthritis Mother   . COPD Father   . Diabetes Neg Hx   . CAD Mother 35    ROS:  Please see the history of present illness.  All other systems reviewed and negative.   PHYSICAL EXAM: VS:  BP 136/90  Pulse 80  Ht 5\' 4"  (1.626 m)  Wt 185 lb 9.6 oz (84.188 kg)  BMI 31.84 kg/m2 Obese, well developed, in no acute distress HEENT: Pupils are equal round react to light accommodation extraocular movements are intact.  Neck: no JVDNo cervical lymphadenopathy. Cardiac: Regular rate and rhythm without murmurs rubs or gallops. Lungs:  clear to auscultation bilaterally, no wheezing, rhonchi or rales Abd: soft, nontender, positive bowel sounds all quadrants, no hepatosplenomegaly Ext: no lower extremity edema.  2+ radial and dorsalis pedis pulses. Skin: warm and dry Neuro:  Grossly normal  EKG:  None  ASSESSMENT AND PLAN:  Problem List Items Addressed This Visit   HYPERTENSION     Continue current meds.     NICM (nonischemic cardiomyopathy), Echo 08/26/13-EF 25-30%    Chronic systolic heart failure     BMET shows potassium is 4.5 and up from prior lab after starting spironolactone.  I asked her to stop the potassium supplements.  Follow up labs on Sept 26 and Oct 26  and Nov 26.  She appears euvolemic.  Follow up with Dr. Percival Spanish in 3 months.     Other Visit Diagnoses   Congestive heart failure, unspecified congestive heart failure chronicity, unspecified congestive heart failure type    -  Primary    Relevant Orders       Basic Metabolic Panel (BMET)

## 2013-12-16 ENCOUNTER — Encounter (HOSPITAL_COMMUNITY): Payer: Self-pay | Admitting: Emergency Medicine

## 2013-12-16 ENCOUNTER — Emergency Department (HOSPITAL_COMMUNITY): Payer: Medicare Other

## 2013-12-16 ENCOUNTER — Inpatient Hospital Stay (HOSPITAL_COMMUNITY): Payer: Medicare Other

## 2013-12-16 ENCOUNTER — Inpatient Hospital Stay (HOSPITAL_COMMUNITY)
Admission: EM | Admit: 2013-12-16 | Discharge: 2013-12-18 | DRG: 065 | Disposition: A | Discharge status: Home / Self Care | Payer: Medicare Other | Attending: Internal Medicine | Admitting: Internal Medicine

## 2013-12-16 DIAGNOSIS — Z79899 Other long term (current) drug therapy: Secondary | ICD-10-CM

## 2013-12-16 DIAGNOSIS — I6350 Cerebral infarction due to unspecified occlusion or stenosis of unspecified cerebral artery: Principal | ICD-10-CM | POA: Diagnosis present

## 2013-12-16 DIAGNOSIS — J45909 Unspecified asthma, uncomplicated: Secondary | ICD-10-CM | POA: Diagnosis present

## 2013-12-16 DIAGNOSIS — IMO0001 Reserved for inherently not codable concepts without codable children: Secondary | ICD-10-CM | POA: Diagnosis present

## 2013-12-16 DIAGNOSIS — I5022 Chronic systolic (congestive) heart failure: Secondary | ICD-10-CM | POA: Diagnosis present

## 2013-12-16 DIAGNOSIS — E669 Obesity, unspecified: Secondary | ICD-10-CM | POA: Diagnosis present

## 2013-12-16 DIAGNOSIS — R29810 Facial weakness: Secondary | ICD-10-CM | POA: Diagnosis not present

## 2013-12-16 DIAGNOSIS — I10 Essential (primary) hypertension: Secondary | ICD-10-CM | POA: Diagnosis present

## 2013-12-16 DIAGNOSIS — I63239 Cerebral infarction due to unspecified occlusion or stenosis of unspecified carotid arteries: Secondary | ICD-10-CM

## 2013-12-16 DIAGNOSIS — G8190 Hemiplegia, unspecified affecting unspecified side: Secondary | ICD-10-CM | POA: Diagnosis present

## 2013-12-16 DIAGNOSIS — I428 Other cardiomyopathies: Secondary | ICD-10-CM | POA: Diagnosis present

## 2013-12-16 DIAGNOSIS — N390 Urinary tract infection, site not specified: Secondary | ICD-10-CM | POA: Diagnosis present

## 2013-12-16 DIAGNOSIS — I639 Cerebral infarction, unspecified: Secondary | ICD-10-CM | POA: Insufficient documentation

## 2013-12-16 DIAGNOSIS — R2990 Unspecified symptoms and signs involving the nervous system: Secondary | ICD-10-CM | POA: Diagnosis present

## 2013-12-16 DIAGNOSIS — Z6831 Body mass index (BMI) 31.0-31.9, adult: Secondary | ICD-10-CM | POA: Diagnosis not present

## 2013-12-16 DIAGNOSIS — E1165 Type 2 diabetes mellitus with hyperglycemia: Secondary | ICD-10-CM

## 2013-12-16 DIAGNOSIS — I6930 Unspecified sequelae of cerebral infarction: Secondary | ICD-10-CM | POA: Diagnosis present

## 2013-12-16 LAB — CBC
HCT: 39.8 % (ref 36.0–46.0)
HCT: 39.7 % (ref 36.0–46.0)
Hemoglobin: 13.6 g/dL (ref 12.0–15.0)
Hemoglobin: 13.7 g/dL (ref 12.0–15.0)
MCH: 29.9 pg (ref 26.0–34.0)
MCH: 30.2 pg (ref 26.0–34.0)
MCHC: 34.2 g/dL (ref 30.0–36.0)
MCHC: 34.5 g/dL (ref 30.0–36.0)
MCV: 87.5 fL (ref 78.0–100.0)
MCV: 87.4 fL (ref 78.0–100.0)
Platelets: 218 10*3/uL (ref 150–400)
Platelets: 226 10*3/uL (ref 150–400)
RBC: 4.55 MIL/uL (ref 3.87–5.11)
RBC: 4.54 MIL/uL (ref 3.87–5.11)
RDW: 13.4 % (ref 11.5–15.5)
RDW: 13.4 % (ref 11.5–15.5)
WBC: 8.8 10*3/uL (ref 4.0–10.5)
WBC: 8.3 10*3/uL (ref 4.0–10.5)

## 2013-12-16 LAB — COMPREHENSIVE METABOLIC PANEL
ALT: 17 U/L (ref 0–35)
AST: 17 U/L (ref 0–37)
Albumin: 3.9 g/dL (ref 3.5–5.2)
Alkaline Phosphatase: 54 U/L (ref 39–117)
Anion gap: 13 (ref 5–15)
BUN: 22 mg/dL (ref 6–23)
CO2: 22 mEq/L (ref 19–32)
Calcium: 9.5 mg/dL (ref 8.4–10.5)
Chloride: 107 mEq/L (ref 96–112)
Creatinine, Ser: 0.89 mg/dL (ref 0.50–1.10)
GFR calc Af Amer: 75 mL/min — ABNORMAL LOW (ref >90)
GFR calc non Af Amer: 65 mL/min — ABNORMAL LOW (ref >90)
Glucose, Bld: 203 mg/dL — ABNORMAL HIGH (ref 70–99)
Potassium: 4 mEq/L (ref 3.7–5.3)
Sodium: 142 mEq/L (ref 137–147)
Total Bilirubin: 0.6 mg/dL (ref 0.3–1.2)
Total Protein: 7 g/dL (ref 6.0–8.3)

## 2013-12-16 LAB — RAPID URINE DRUG SCREEN, HOSP PERFORMED
Amphetamines: NOT DETECTED
Barbiturates: NOT DETECTED
Benzodiazepines: NOT DETECTED
Cocaine: NOT DETECTED
Opiates: NOT DETECTED
Tetrahydrocannabinol: NOT DETECTED

## 2013-12-16 LAB — URINALYSIS, ROUTINE W REFLEX MICROSCOPIC
Bilirubin Urine: NEGATIVE
Glucose, UA: NEGATIVE mg/dL
Ketones, ur: NEGATIVE mg/dL
Nitrite: NEGATIVE
Protein, ur: 30 mg/dL — AB
Specific Gravity, Urine: 1.013 (ref 1.005–1.030)
Urobilinogen, UA: 0.2 mg/dL (ref 0.0–1.0)
pH: 6.5 (ref 5.0–8.0)

## 2013-12-16 LAB — I-STAT CHEM 8, ED
BUN: 22 mg/dL (ref 6–23)
Calcium, Ion: 1.21 mmol/L (ref 1.13–1.30)
Chloride: 109 mEq/L (ref 96–112)
Creatinine, Ser: 0.9 mg/dL (ref 0.50–1.10)
Glucose, Bld: 212 mg/dL — ABNORMAL HIGH (ref 70–99)
HCT: 39 % (ref 36.0–46.0)
Hemoglobin: 13.3 g/dL (ref 12.0–15.0)
Potassium: 3.8 mEq/L (ref 3.7–5.3)
Sodium: 143 mEq/L (ref 137–147)
TCO2: 22 mmol/L (ref 0–100)

## 2013-12-16 LAB — CREATININE, SERUM
Creatinine, Ser: 0.78 mg/dL (ref 0.50–1.10)
GFR calc Af Amer: >90 mL/min (ref >90)
GFR calc non Af Amer: 83 mL/min — ABNORMAL LOW (ref >90)

## 2013-12-16 LAB — URINE MICROSCOPIC-ADD ON

## 2013-12-16 LAB — DIFFERENTIAL
Basophils Absolute: 0 10*3/uL (ref 0.0–0.1)
Basophils Relative: 0 % (ref 0–1)
Eosinophils Absolute: 0.1 10*3/uL (ref 0.0–0.7)
Eosinophils Relative: 1 % (ref 0–5)
Lymphocytes Relative: 38 % (ref 12–46)
Lymphs Abs: 3.3 10*3/uL (ref 0.7–4.0)
Monocytes Absolute: 0.7 10*3/uL (ref 0.1–1.0)
Monocytes Relative: 7 % (ref 3–12)
Neutro Abs: 4.7 10*3/uL (ref 1.7–7.7)
Neutrophils Relative %: 54 % (ref 43–77)

## 2013-12-16 LAB — I-STAT TROPONIN, ED: Troponin i, poc: 0 ng/mL (ref 0.00–0.08)

## 2013-12-16 LAB — PROTIME-INR
INR: 0.92 (ref 0.00–1.49)
Prothrombin Time: 12.4 seconds (ref 11.6–15.2)

## 2013-12-16 LAB — APTT: aPTT: 26 seconds (ref 24–37)

## 2013-12-16 LAB — ETHANOL: Alcohol, Ethyl (B): <11 mg/dL (ref 0–11)

## 2013-12-16 MED ORDER — STUDY - INVESTIGATIONAL DRUG SIMPLE RECORD
100.0000 mg | Freq: Every day | Status: DC
  Administered 2013-12-17 – 2013-12-18 (×2): 100 mg via ORAL
  Filled 2013-12-16 (×3): qty 100

## 2013-12-16 MED ORDER — DIPHENHYDRAMINE HCL 12.5 MG/5ML PO ELIX
12.5000 mg | ORAL_SOLUTION | Freq: Once | ORAL | Status: AC
  Administered 2013-12-16: 12.5 mg via ORAL
  Filled 2013-12-16: qty 10

## 2013-12-16 MED ORDER — STROKE: EARLY STAGES OF RECOVERY BOOK
Freq: Once | Status: AC
  Administered 2013-12-16: 13:00:00
  Filled 2013-12-16: qty 1

## 2013-12-16 MED ORDER — ASPIRIN 325 MG PO TABS: 325.0000 mg | ORAL_TABLET | Freq: Every day | ORAL | Status: DC

## 2013-12-16 MED ORDER — HEPARIN SODIUM (PORCINE) 5000 UNIT/ML IJ SOLN: 5000.0000 [IU] | Freq: Three times a day (TID) | INTRAMUSCULAR | Status: DC

## 2013-12-16 MED ORDER — POLYVINYL ALCOHOL 1.4 % OP SOLN
2.0000 [drp] | OPHTHALMIC | Status: DC | PRN
  Administered 2013-12-16 – 2013-12-17 (×2): 2 [drp] via OPHTHALMIC
  Filled 2013-12-16: qty 15

## 2013-12-16 MED ORDER — ALBUTEROL SULFATE (2.5 MG/3ML) 0.083% IN NEBU: 2.5000 mg | INHALATION_SOLUTION | Freq: Four times a day (QID) | RESPIRATORY_TRACT | Status: DC | PRN

## 2013-12-16 MED ORDER — ASPIRIN 300 MG RE SUPP: 300.0000 mg | Freq: Every day | RECTAL | Status: DC

## 2013-12-16 MED ORDER — STUDY - INVESTIGATIONAL DRUG SIMPLE RECORD
300.0000 mg | Freq: Once | Status: AC
  Administered 2013-12-16: 300 mg via ORAL
  Filled 2013-12-16: qty 300

## 2013-12-16 MED ORDER — ALBUTEROL SULFATE HFA 108 (90 BASE) MCG/ACT IN AERS: 2.0000 | INHALATION_SPRAY | Freq: Four times a day (QID) | RESPIRATORY_TRACT | Status: DC | PRN

## 2013-12-16 MED ORDER — STUDY - INVESTIGATIONAL DRUG SIMPLE RECORD
90.0000 mg | Freq: Two times a day (BID) | Status: DC
  Administered 2013-12-16 – 2013-12-18 (×4): 90 mg via ORAL
  Filled 2013-12-16 (×6): qty 90

## 2013-12-16 MED ORDER — STUDY - INVESTIGATIONAL DRUG SIMPLE RECORD
180.0000 mg | Freq: Once | Status: AC
  Administered 2013-12-16: 180 mg via ORAL
  Filled 2013-12-16: qty 180

## 2013-12-16 NOTE — Consult Note (Signed)
Neurology Consultation Reason for Consult: Right-sided weakness Referring Physician: Eulis Foster, E  CC: Right-sided weakness  History is obtained from: Patient  HPI: [name] is a 68 y.o. female with a history of hypertension who presents with onset on awakening of left-sided facial droop and right-sided weakness. She states that she is normal on going to bed at 11 PM, and then when she awoke she noticed that her face didn't feel right. She currently complains of mild weakness on the right side.   LKW: 11 PM tpa given?: no, outside of window   ROS: A 14 point ROS was performed and is negative except as noted in the HPI.   Past Medical History  Diagnosis Date  . Asthma   . Hypertension   . GERD (gastroesophageal reflux disease)   . History of nephrolithiasis   . Headache(784.0)   . Type II or unspecified type diabetes mellitus without mention of complication, uncontrolled     borderline  . NICM (nonischemic cardiomyopathy)     a. LHC (08/2013):  no CAD  . Chronic systolic heart failure     Echo (08/26/13):  Mild LVH. EF 25% to 30%. Diffuse HK. Aortic valve: There was trivial regurgitation. Mitral valve: There was mild regurgitation. Left atrium: The atrium was mildly dilated.    Family History: No history of stroke  Social History: Tob: Denies  Exam: Current vital signs: BP 172/83  Pulse 77  Temp(Src) 97.9 F (36.6 C) (Oral)  Resp 18  SpO2 99% Vital signs in last 24 hours: Temp:  [97.9 F (36.6 C)] 97.9 F (36.6 C) (09/08 0952) Pulse Rate:  [77] 77 (09/08 0952) Resp:  [18] 18 (09/08 0952) BP: (172)/(83) 172/83 mmHg (09/08 0952) SpO2:  [99 %] 99 % (09/08 0952)  General: In bed, NAD CV: Regular rate and rhythm Mental Status: Patient is awake, alert, oriented to person, place, month, year, and situation. Immediate and remote memory are intact. Patient is able to give a clear and coherent history. No signs of aphasia or neglect Cranial Nerves: II: Visual  Fields are full. Pupils are equal, round, and reactive to light.  Discs are difficult to visualize. III,IV, VI: EOMI without ptosis or diploplia.  V: Facial sensation is symmetric to temperature VII: Facial movement is notable for left upper and lower facial weakness VIII: hearing is intact to voice X: Uvula elevates symmetrically XI: Shoulder shrug is symmetric. XII: tongue is midline without atrophy or fasciculations.  Motor: Tone is normal. Bulk is normal. 5/5 strength was present on the left side, on the right she has mild leg greater than arm weakness. Sensory: Sensation is symmetric to light touch and temperature in the arms and legs. Deep Tendon Reflexes: 2+ and symmetric in the biceps and patellae.  Cerebellar: Finger-nose-finger and heel-knee-shin are mildly impaired on the right, intact on the left Gait: Not assessed due to acute nature of evaluation and multiple medical monitors in ED setting.   I have reviewed labs in epic and the results pertinent to this consultation are: nml INR, CBC  I have reviewed the images obtained:CT head - no acute findings.   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 She is outside the window for acute treatment, but will need a stroke workup.   Recommendations: 1. HgbA1c, fasting lipid panel 2. MRI, MRA  of the brain without contrast 3. Frequent neuro checks 4. Echocardiogram 5. Carotid dopplers 6. Prophylactic therapy-Antiplatelet med: Aspirin - dose 325mg  PO or  300mg  PR 7. Risk factor modification 8. Telemetry monitoring 9. PT consult, OT consult, Speech consult   Roland Rack, MD [facility] [phone]  If 7pm- 7am, please page neurology on call as listed in [place].

## 2013-12-16 NOTE — ED Notes (Signed)
Pt here from home via [place] as a code stroke , lsn was last night around 11pm .

## 2013-12-16 NOTE — H&P (Signed)
[facility] History and Physical  [name] YBW:[id number] DOB: 1945-09-01 DOA: 12/16/2013  Referring physician: Dr. Leonel Ramsay PCP: Redge Gainer, MD   Chief Complaint: facial asymmetry  HPI: [name] is a 68 y.o. female  Who woke up this morning and noted facial asymmetry. She also reports feeling generalized weakness and some dizziness. The problem since onset has been persistent. Nothing she is aware of makes it better or worse. Given the persistence of symptoms patient decided to present to the ED for further evaluation and recommendations. She denies any fevers or recent trauma.  We were consulted for strokelike symptoms   Review of Systems:  Constitutional:  No weight loss, night sweats, Fevers, chills, fatigue.  HEENT:  No headaches, Difficulty swallowing,Tooth/dental problems,Sore throat,  No sneezing, itching, ear ache, nasal congestion, post nasal drip,  Cardio-vascular:  No chest pain, Orthopnea, PND, swelling in lower extremities, anasarca, dizziness, palpitations  GI:  No heartburn, indigestion, abdominal pain, nausea, vomiting, diarrhea, change in bowel habits, loss of appetite  Resp:  No shortness of breath with exertion or at rest. No excess mucus, no productive cough, No non-productive cough, No coughing up of blood.No change in color of mucus.No wheezing.No chest wall deformity  Skin:  no rash or lesions.  GU:  no dysuria, change in color of urine, no urgency or frequency. No flank pain.  Musculoskeletal:  No joint pain or swelling. No decreased range of motion. No back pain.  Psych:  No change in mood or affect. No depression or anxiety. No memory loss.   Past Medical History  Diagnosis Date  . Asthma   . Hypertension   . GERD (gastroesophageal reflux disease)   . History of nephrolithiasis   . Headache(784.0)   . Type II or unspecified type diabetes mellitus without mention of complication, uncontrolled     borderline  . NICM  (nonischemic cardiomyopathy)     a. LHC (08/2013):  no CAD  . Chronic systolic heart failure     Echo (08/26/13):  Mild LVH. EF 25% to 30%. Diffuse HK. Aortic valve: There was trivial regurgitation. Mitral valve: There was mild regurgitation. Left atrium: The atrium was mildly dilated.   Past Surgical History  Procedure Laterality Date  . Abdominal hysterectomy    . Oophorectomy      unilateral  . Ureteral extraction of kidney stone     Social History:  reports that she has never smoked. She has never used smokeless tobacco. She reports that she drinks alcohol. She reports that she does not use illicit drugs.  Allergies  Allergen Reactions  . Penicillins Rash    Family History  Problem Relation Age of Onset  . Hypertension Mother   . Arthritis Mother   . COPD Father   . Diabetes Neg Hx   . CAD Mother 30     Prior to Admission medications   Medication Sig Start Date End Date Taking? Authorizing Provider  albuterol (PROVENTIL HFA;VENTOLIN HFA) 108 (90 BASE) MCG/ACT inhaler Inhale 2 puffs into the lungs every 6 (six) hours as needed. 07/30/13  Yes Mae Loree Fee, FNP  bisoprolol (ZEBETA) 5 MG tablet Take 1 tablet (5 mg total) by mouth daily. 10/07/13  Yes Scott T Kathlen Mody, PA-C  losartan (COZAAR) 100 MG tablet Take 100 mg by mouth daily.  09/03/13  Yes Historical Provider, MD  spironolactone (ALDACTONE) 50 MG tablet Take 1 tablet (50 mg total) by mouth daily. 11/18/13  Yes Minus Breeding, MD   Physical Exam: Danley Danker  Vitals:   12/16/13 0952 12/16/13 1100 12/16/13 1130  BP: 172/83 162/82 182/83  Pulse: 77 64 62  Temp: 97.9 F (36.6 C)    TempSrc: Oral    Resp: 18 11 10   SpO2: 99% 97% 95%    Wt Readings from Last 3 Encounters:  12/05/13 84.188 kg (185 lb 9.6 oz)  11/18/13 84.777 kg (186 lb 14.4 oz)  10/07/13 82.555 kg (182 lb)    General:  Appears calm and comfortable Eyes: PERRL, normal lids, irises & conjunctiva ENT: grossly normal hearing, lips & tongue Neck: no LAD,  masses or thyromegaly Cardiovascular: RRR, no m/r/g. No LE edema. Respiratory: CTA bilaterally, no w/r/r. Normal respiratory effort. Abdomen: soft, nt, nd Skin: no rash or induration seen on limited exam Musculoskeletal: grossly normal tone BUE/BLE Psychiatric: grossly normal mood and affect, speech fluent and appropriate Neurologic: + facial asymmetry, moves all extremities RUE weakness when compared to left          Labs on Admission:  Basic Metabolic Panel:  Recent Labs Lab 12/16/13 0937 12/16/13 0946  NA 142 143  K 4.0 3.8  CL 107 109  CO2 22  --   GLUCOSE 203* 212*  BUN 22 22  CREATININE 0.89 0.90  CALCIUM 9.5  --    Liver Function Tests:  Recent Labs Lab 12/16/13 0937  AST 17  ALT 17  ALKPHOS 54  BILITOT 0.6  PROT 7.0  ALBUMIN 3.9   No results found for this basename: LIPASE, AMYLASE,  in the last 168 hours No results found for this basename: AMMONIA,  in the last 168 hours CBC:  Recent Labs Lab 12/16/13 0937 12/16/13 0946  WBC 8.8  --   NEUTROABS 4.7  --   HGB 13.6 13.3  HCT 39.8 39.0  MCV 87.5  --   PLT 218  --    Cardiac Enzymes: No results found for this basename: CKTOTAL, CKMB, CKMBINDEX, TROPONINI,  in the last 168 hours  BNP (last 3 results)  Recent Labs  08/21/13 1539 10/07/13 1540  PROBNP 160.0* 67.0   CBG: No results found for this basename: GLUCAP,  in the last 168 hours  Radiological Exams on Admission: Ct Head Wo Contrast  12/16/2013   CLINICAL DATA:  Code stroke.  Right-sided weakness.  EXAM: CT HEAD WITHOUT CONTRAST  TECHNIQUE: Contiguous axial images were obtained from the base of the skull through the vertex without intravenous contrast.  COMPARISON:  Brain MRI, 04/25/2010.  Head CT, 04/24/2010  FINDINGS: Ventricles are normal in size and configuration.  There are no parenchymal masses or mass effect. There is no evidence of an infarct. Mild periventricular white matter hypoattenuation is noted consistent with chronic  microvascular ischemic change, similar to the prior study.  No extra-axial masses or abnormal fluid collections.  No intracranial hemorrhage.  Visualized sinuses and mastoid air cells are clear. No skull lesion.  IMPRESSION: 1. No acute intracranial abnormalities. 2. Mild chronic microvascular ischemic change similar to the prior exams. These results were called by telephone at the time of interpretation on 12/16/2013 at 9:57 am to Dr. Roland Rack , who verbally acknowledged these results.   Electronically Signed   By: Lajean Manes M.D.   On: 12/16/2013 09:57    EKG: Independently reviewed. Sinus rhythm with inverted t waves on lateral leads which were present on last EKG  Assessment/Plan Active Problems:   CVA (cerebral infarction)/Stroke-like symptoms - Will stroke order set please refer to orders for details - Neurology consulted  Asthma  - Stable, prn albuterol  HTN - Given strong suspicion for stroke we'll hold antihypertensive medications and allow for permissive hypertension   Code Status: full DVT Prophylaxis: Heparin Family Communication: None at bedside Disposition Plan: Pending further work up  Time spent: > 55 minutes  Velvet Bathe [facility] Pager [phone]  **Disclaimer: This note may have been dictated with voice recognition software. Similar sounding words can inadvertently be transcribed and this note may contain transcription errors which may not have been corrected upon publication of note.**

## 2013-12-16 NOTE — Progress Notes (Signed)
Spoke with Baltazar Najjar NP, orders received.

## 2013-12-16 NOTE — ED Provider Notes (Signed)
CSN: [id number]     Arrival date & time 12/16/13  7829 History   First MD Initiated Contact with Patient 12/16/13 1007     Chief Complaint  Patient presents with  . Code Stroke    @EDPCLEARED @ (Consider location/radiation/quality/duration/timing/severity/associated sxs/prior Treatment) The history is provided by the patient and a relative.   [name] is a 68 y.o. female who presents for evaluation of weakness, confusion, and trouble talking. Symptoms were present upon awakening this morning at 7:15 AM. She arrived here by EMS. This morning as a code stroke. She was seen immediately by neurology, And they decided to not give her thrombolysis, because of low NIH score. The patient denies similar problems in the past. She has been well recently. She denies fever, chills, nausea, vomiting, change in bowel or urinary habits. She admits to mild chest pain, time of evaluation. She's taking her usual medications, but did not take them. This morning, and did not eat. There are no other known modifying factors.    Past Medical History  Diagnosis Date  . Asthma   . Hypertension   . GERD (gastroesophageal reflux disease)   . History of nephrolithiasis   . Headache(784.0)   . Type II or unspecified type diabetes mellitus without mention of complication, uncontrolled     borderline  . NICM (nonischemic cardiomyopathy)     a. LHC (08/2013):  no CAD  . Chronic systolic heart failure     Echo (08/26/13):  Mild LVH. EF 25% to 30%. Diffuse HK. Aortic valve: There was trivial regurgitation. Mitral valve: There was mild regurgitation. Left atrium: The atrium was mildly dilated.   Past Surgical History  Procedure Laterality Date  . Abdominal hysterectomy    . Oophorectomy      unilateral  . Ureteral extraction of kidney stone     Family History  Problem Relation Age of Onset  . Hypertension Mother   . Arthritis Mother   . COPD Father   . Diabetes Neg Hx   . CAD Mother 66   History   Substance Use Topics  . Smoking status: Never Smoker   . Smokeless tobacco: Never Used  . Alcohol Use: Yes     Comment: rare   OB History   Grav Para Term Preterm Abortions TAB SAB Ect Mult Living                 Review of Systems  All other systems reviewed and are negative.     Allergies  Penicillins  Home Medications   Prior to Admission medications   Medication Sig Start Date End Date Taking? Authorizing Provider  albuterol (PROVENTIL HFA;VENTOLIN HFA) 108 (90 BASE) MCG/ACT inhaler Inhale 2 puffs into the lungs every 6 (six) hours as needed. 07/30/13  Yes Mae Loree Fee, FNP  bisoprolol (ZEBETA) 5 MG tablet Take 1 tablet (5 mg total) by mouth daily. 10/07/13  Yes Scott T Kathlen Mody, PA-C  losartan (COZAAR) 100 MG tablet Take 100 mg by mouth daily.  09/03/13  Yes Historical Provider, MD  spironolactone (ALDACTONE) 50 MG tablet Take 1 tablet (50 mg total) by mouth daily. 11/18/13  Yes Minus Breeding, MD   BP 172/83  Pulse 77  Temp(Src) 97.9 F (36.6 C) (Oral)  Resp 18  SpO2 99% Physical Exam  Nursing note and vitals reviewed. Constitutional: She is oriented to person, place, and time. She appears well-developed and well-nourished.  HENT:  Head: Normocephalic and atraumatic.  Eyes: Conjunctivae and EOM are normal. Pupils  are equal, round, and reactive to light.  Neck: Normal range of motion and phonation normal. Neck supple.  Cardiovascular: Normal rate, regular rhythm and intact distal pulses.   Pulmonary/Chest: Effort normal and breath sounds normal. She exhibits no tenderness.  Abdominal: Soft. She exhibits no distension. There is no tenderness. There is no guarding.  Musculoskeletal: Normal range of motion.  Neurological: She is alert and oriented to person, place, and time. She exhibits normal muscle tone.  Mild dysarthria, and right leg drift. No nystagmus or aphasia.  Skin: Skin is warm and dry.  Psychiatric: She has a normal mood and affect. Her behavior is  normal. Judgment and thought content normal.    ED Course  Procedures (including critical care time)  10:15- decision to not give thrombolytics made by neurology, Dr. Leonel Ramsay. NIH score was 3.   Medications - No data to display  Patient Vitals for the past 24 hrs:  BP Temp Temp src Pulse Resp SpO2  12/16/13 0952 172/83 mmHg 97.9 F (36.6 C) Oral 77 18 99 %    10:19 AM-Consult complete with Hospitalist. Patient case explained and discussed. He agrees to admit patient for further evaluation and treatment.     Labs Review Labs Reviewed  I-STAT CHEM 8, ED - Abnormal; Notable for the following:    Glucose, Bld 212 (*)    All other components within normal limits  CBC  DIFFERENTIAL  ETHANOL  PROTIME-INR  APTT  COMPREHENSIVE METABOLIC PANEL  URINE RAPID DRUG SCREEN (HOSP PERFORMED)  URINALYSIS, ROUTINE W REFLEX MICROSCOPIC  I-STAT CHEM 8, ED  I-STAT TROPOININ, ED  I-STAT TROPOININ, ED    Imaging Review Ct Head Wo Contrast  12/16/2013   CLINICAL DATA:  Code stroke.  Right-sided weakness.  EXAM: CT HEAD WITHOUT CONTRAST  TECHNIQUE: Contiguous axial images were obtained from the base of the skull through the vertex without intravenous contrast.  COMPARISON:  Brain MRI, 04/25/2010.  Head CT, 04/24/2010  FINDINGS: Ventricles are normal in size and configuration.  There are no parenchymal masses or mass effect. There is no evidence of an infarct. Mild periventricular white matter hypoattenuation is noted consistent with chronic microvascular ischemic change, similar to the prior study.  No extra-axial masses or abnormal fluid collections.  No intracranial hemorrhage.  Visualized sinuses and mastoid air cells are clear. No skull lesion.  IMPRESSION: 1. No acute intracranial abnormalities. 2. Mild chronic microvascular ischemic change similar to the prior exams. These results were called by telephone at the time of interpretation on 12/16/2013 at 9:57 am to Dr. Roland Rack , who  verbally acknowledged these results.   Electronically Signed   By: Lajean Manes M.D.   On: 12/16/2013 09:57     EKG Interpretation   Date/Time:  Tuesday December 16 2013 10:17:14 EDT Ventricular Rate:  63 PR Interval:  180 QRS Duration: 116 QT Interval:  412 QTC Calculation: 422 R Axis:   -63 Text Interpretation:  Age not entered, assumed to be  68 years old for  purpose of ECG interpretation Sinus rhythm Incomplete RBBB and LAFB  Nonspecific T abnormalities, lateral leads since last tracing no  significant change Confirmed by Rosalind Guido  MD, Hitoshi Werts [phone]) on 12/16/2013  10:23:35 AM      MDM   Final diagnoses:  Cerebral infarction due to unspecified mechanism    Acute CVA, non-hemorrhagic. Patient will need further evaluation with MR, echo and carotid Dopplers. She will require inpatient admission for monitoring and treatment.   Nursing Notes Reviewed/  Care Coordinated, and agree without changes. Applicable Imaging Reviewed.  Interpretation of Laboratory Data incorporated into ED treatment  Plan: Admit    Richarda Blade, MD 12/18/13 1419

## 2013-12-16 NOTE — Progress Notes (Signed)
2000 - pt stated that left eye "feels funny" and progressively worse than before. Pt denied any numbness/tingling to extremeties other than right side of face which is baseline. No weakness noted. Pt alert and oriented x4. Nurse notified MD, Dr. Baltazar Najjar, on call for 4N. Report given to Joy, incoming RN    Angeline Slim I 12/16/2013 8:11 PM

## 2013-12-16 NOTE — ED Notes (Signed)
Stroke Research team at bedside to discuss options and answer the pt and family's questions. Will take pt upstairs when finished.

## 2013-12-16 NOTE — Code Documentation (Signed)
68yo female arriving to [facility] at [phone] via [place].  EMS reports that the patient woke up this morning around 0730 with right sided weakness and facial droop.  Patient reports a history of HTN.  She reports that she was at her baseline last night at 2300 when she went to bed and when she woke up her daughter noticed that she had a facial droop and slurred speech. EMS activated Code Stroke.  Patient taken to CT on arrival.  NIHSS 3, see documentation for details and times.  Patient with left facial droop, mild right sided weakness and dysarthria.  Patient is outside the window for treatment with tPA.  No acute stroke treatment at this time.  Research team notified of patient as patient may be a research trial candidate.  Bedside handoff with ED RN [place].

## 2013-12-16 NOTE — Progress Notes (Signed)
1805 - pt stated itchiness in left eye. Eye lid swollen. Skin intact, Nurse notified Dr. Wendee Beavers via text. Will monitor [name] I 12/16/2013 6:14 PM

## 2013-12-16 NOTE — Progress Notes (Signed)
23 - MD Dr. Wendee Beavers called back and nurse informed of pt's left eye itchiness and minor swelling. MD ordered 12.5 mg benadryl PO once to be given now. Nurse read back and verified and entered order. Will monitor  Angeline Slim I 12/16/2013 8:08 PM

## 2013-12-17 ENCOUNTER — Inpatient Hospital Stay (HOSPITAL_COMMUNITY): Payer: Medicare Other

## 2013-12-17 DIAGNOSIS — N390 Urinary tract infection, site not specified: Secondary | ICD-10-CM

## 2013-12-17 DIAGNOSIS — I63239 Cerebral infarction due to unspecified occlusion or stenosis of unspecified carotid arteries: Secondary | ICD-10-CM

## 2013-12-17 DIAGNOSIS — I517 Cardiomegaly: Secondary | ICD-10-CM

## 2013-12-17 DIAGNOSIS — I10 Essential (primary) hypertension: Secondary | ICD-10-CM

## 2013-12-17 LAB — HEMOGLOBIN A1C
Hgb A1c MFr Bld: 7.9 % — ABNORMAL HIGH (ref <5.7)
Mean Plasma Glucose: 180 mg/dL — ABNORMAL HIGH (ref <117)

## 2013-12-17 LAB — LIPID PANEL
Cholesterol: 148 mg/dL (ref 0–200)
HDL: 32 mg/dL — ABNORMAL LOW (ref >39)
LDL Cholesterol: 47 mg/dL (ref 0–99)
Total CHOL/HDL Ratio: 4.6 RATIO
Triglycerides: 344 mg/dL — ABNORMAL HIGH (ref <150)
VLDL: 69 mg/dL — ABNORMAL HIGH (ref 0–40)

## 2013-12-17 MED ORDER — DEXTROSE 5 % IV SOLN
1.0000 g | INTRAVENOUS | Status: DC
  Administered 2013-12-17 – 2013-12-18 (×2): 1 g via INTRAVENOUS
  Filled 2013-12-17 (×3): qty 10

## 2013-12-17 NOTE — Progress Notes (Signed)
Patient's left sided droop is much more pronounced at this time then previously. Patient has slight tingling in fingertips. VSS.

## 2013-12-17 NOTE — Plan of Care (Signed)
Problem: Acute Rehab PT Goals(only PT should resolve) Goal: PT Additional Goal #1 Score >19 on DGI with or without use of AD to improve balance and reduce risk of falls in community.

## 2013-12-17 NOTE — Progress Notes (Signed)
Occupational Therapy Evaluation Patient Details Name: [name] MRN: [id number] DOB: 05-03-1945 Today's Date: 12/17/2013    History of Present Illness [name] is a 68 y.o. female with history of hypertension presenting with  Left face and right sided weakness-crossed hemiparesis. She did not receive IV t-PA due to delay in arrival. Imaging reports no no acute infarct, however, patient with brainstem stroke not seen on MRI. Stroke work up underway. per neuro MD    Clinical Impression   PTA pt lived at home and was independent with ADLs and functional mobility. Pt currently presents with Left facial droop. Pt reports eye pain/itchiness likely related to decreased Lt eye closure. Pt required min (A) for functional mobility due to sway and imbalance and would benefit from skilled OT to address safety with functional mobility and safety with ADLs.     Follow Up Recommendations  No OT follow up;Supervision/Assistance - 24 hour    Equipment Recommendations  Tub/shower seat    Recommendations for Other Services       Precautions / Restrictions Precautions Precautions: Fall      Mobility Bed Mobility Overal bed mobility: Modified Independent             General bed mobility comments: not addressed; pt up in chair and returned to chair   Transfers Overall transfer level: Needs assistance Equipment used: None Transfers: Sit to/from Stand Sit to Stand: Min guard         General transfer comment: slight sway in full standing. Min (A) to stabilize         ADL Overall ADL's : Needs assistance/impaired Eating/Feeding: Independent;Sitting   Grooming: Min guard;Standing   Upper Body Bathing: Set up;Sitting   Lower Body Bathing: Min guard;Sit to/from stand   Upper Body Dressing : Set up;Sitting   Lower Body Dressing: Min guard;Sit to/from stand   Toilet Transfer: Ambulation;Minimal assistance (min (A) to steady without use of DME)   Toileting-  Water quality scientist and Hygiene: Min guard;Sit to/from stand       Functional mobility during ADLs: Minimal assistance (min (A) to steady) General ADL Comments: Pt ambulated from bed to bathroom to perform toileting and grooming tasks at the sink. Pt required min guard for transfers and static standing, however required min (A) to steady during ambulation.      Vision  Pt reports no vision problems at baseline. Pt reports that her Lt eye is slightly blurry and "itchy." Lt eye was noted not to close when pt blinks and likely results in dry eyes. Recommended to RN that pt have eye drops for lubrication. No other deficits noted.                    Perception Perception Perception Tested?: No   Praxis Praxis Praxis tested?: Within functional limits    Pertinent Vitals/Pain Pain Assessment: No/denies pain     Hand Dominance Left   Extremity/Trunk Assessment Upper Extremity Assessment Upper Extremity Assessment: Overall WFL for tasks assessed   Lower Extremity Assessment Lower Extremity Assessment: Defer to PT evaluation    Cervical / Trunk Assessment Cervical / Trunk Assessment: Normal   Communication Communication Communication: No difficulties;Other (comment) (slight slurring due to facial droop)   Cognition Arousal/Alertness: Awake/alert Behavior During Therapy: WFL for tasks assessed/performed Overall Cognitive Status: Within Functional Limits for tasks assessed  Home Living Family/patient expects to be discharged to:: Private residence Living Arrangements: Children Available Help at Discharge: Family;Available PRN/intermittently Type of Home: House Home Access: Stairs to enter [employer] of Steps: 3-4 Entrance Stairs-Rails: None Home Layout: One level     Bathroom Shower/Tub: Tub/shower unit         Home Equipment: None   Additional Comments: pt has tub shower       Prior  Functioning/Environment Level of Independence: Independent        Comments: 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     OT Diagnosis: Disturbance of vision;Generalized weakness   OT Problem List: Impaired vision/perception;Impaired balance (sitting and/or standing);Decreased safety awareness   OT Treatment/Interventions: Self-care/ADL training;Energy conservation;DME and/or AE instruction;Therapeutic activities;Visual/perceptual remediation/compensation;Balance training;Patient/family education    OT Goals(Current goals can be found in the care plan section) Acute Rehab OT Goals Patient Stated Goal: to get back to working OT Goal Formulation: With patient Time For Goal Achievement: 12/31/13 Potential to Achieve Goals: Good  OT Frequency: Min 1X/week              End of Session Equipment Utilized During Treatment: Gait belt Nurse Communication: Other (comment) (pt may need eye drops for lubrication)  Activity Tolerance: Patient tolerated treatment well Patient left: in chair;with call bell/phone within reach;with chair alarm set;with family/visitor present   Time: 4696-2952 OT Time Calculation (min): 22 min Charges:  OT General Charges $OT Visit: 1 Procedure OT Evaluation $Initial OT Evaluation Tier I: 1 Procedure OT Treatments $Self Care/Home Management : 8-22 mins  Juluis Rainier [phone] 12/17/2013, 2:47 PM

## 2013-12-17 NOTE — Evaluation (Signed)
Physical Therapy Evaluation Patient Details Name: [name] MRN: [id number] DOB: June 11, 1945 Today's Date: 12/17/2013   History of Present Illness  [name] is a 68 y.o. female with history of hypertension presenting with  Left face and right sided weakness-crossed hemiparesis. She did not receive IV t-PA due to delay in arrival. Imaging reports no no acute infarct, however, patient with brainstem stroke not seen on MRI. Stroke work up underway. per neuro MD   Clinical Impression  Pt adm due to above. Presents with below deficits; limiting her balance and functional mobility. Pt scored 11 on DGI indicating she is a fall risk and may benefit from use of RW upon acute D/C. Will benefit from skilled acute PT to address deficits listed below and maximize functional mobility. Pt very motivated to participate and return to PLOF. Recommend follow up with OPPT for neuro rehab upon acute D/C.     Follow Up Recommendations Outpatient PT;Supervision/Assistance - 24 hour    Equipment Recommendations  Rolling walker with 5" wheels    Recommendations for Other Services OT consult     Precautions / Restrictions Precautions Precautions: Fall      Mobility  Bed Mobility               General bed mobility comments: not addressed; pt up in chair and returned to chair   Transfers Overall transfer level: Needs assistance Equipment used: 1 person hand held assist Transfers: Sit to/from Stand Sit to Stand: Min guard         General transfer comment: +sway with standing min guard with handheld (A) to steady   Ambulation/Gait Ambulation/Gait assistance: Min assist Ambulation Distance (Feet): 110 Feet Assistive device: Rolling walker (2 wheeled);None Gait Pattern/deviations: Step-through pattern;Staggering right;Drifts right/left;Narrow base of support;Scissoring;Decreased dorsiflexion - right Gait velocity: decreased Gait velocity interpretation: Below normal speed for  age/gender General Gait Details: pt guarded and has dizzy spells/ staggered steps; see DGI for fall risk assessment; pt ambulated with and without RW to assess need for DME; pt more stable with use of DME at this time   Stairs Stairs: Yes Stairs assistance: Min guard Stair Management: Two rails;Alternating pattern;Forwards Number of Stairs: 2 General stair comments: min guard for safety; cues for technique   Wheelchair Mobility    Modified Rankin (Stroke Patients Only) Modified Rankin (Stroke Patients Only) Pre-Morbid Rankin Score: No symptoms Modified Rankin: Moderately severe disability     Balance Overall balance assessment: Needs assistance Sitting-balance support: Feet supported;No upper extremity supported Sitting balance-Leahy Scale: Fair     Standing balance support: During functional activity;No upper extremity supported Standing balance-Leahy Scale: Fair Standing balance comment: min guard; + s way with standing                High Level Balance Comments: see DGI Standardized Balance Assessment Standardized Balance Assessment : Dynamic Gait Index   Dynamic Gait Index Level Surface: Mild Impairment Change in Gait Speed: Mild Impairment Gait with Horizontal Head Turns: Mild Impairment Gait with Vertical Head Turns: Moderate Impairment Gait and Pivot Turn: Moderate Impairment Step Over Obstacle: Severe Impairment Step Around Obstacles: Moderate Impairment Steps: Mild Impairment Total Score: 11       Pertinent Vitals/Pain Pain Assessment: No/denies pain    Home Living Family/patient expects to be discharged to:: Private residence Living Arrangements: Children Available Help at Discharge: Family;Available PRN/intermittently Type of Home: House Home Access: Stairs to enter Entrance Stairs-Rails: None Entrance Stairs-Number of Steps: 3-4 Home Layout: One level Home  Equipment: None Additional Comments: pt has tub shower     Prior Function Level of  Independence: Independent         Comments: 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      Hand Dominance        Extremity/Trunk Assessment   Upper Extremity Assessment: Defer to OT evaluation           Lower Extremity Assessment: RLE deficits/detail RLE Deficits / Details: Rt hip 3+/5; quad 3+/5; increased tremors with MMT due to fatigue     Cervical / Trunk Assessment: Normal  Communication   Communication: No difficulties  Cognition Arousal/Alertness: Awake/alert Behavior During Therapy: WFL for tasks assessed/performed Overall Cognitive Status: Within Functional Limits for tasks assessed                      General Comments General comments (skin integrity, edema, etc.): notable Lt facial droop with c/o numbness and Lt eye pain     Exercises        Assessment/Plan    PT Assessment Patient needs continued PT services  PT Diagnosis Difficulty walking;Generalized weakness;Acute pain   PT Problem List Decreased strength;Decreased activity tolerance;Decreased balance;Decreased mobility;Decreased knowledge of use of DME;Pain;Impaired sensation  PT Treatment Interventions DME instruction;Gait training;Stair training;Functional mobility training;Therapeutic activities;Therapeutic exercise;Balance training;Neuromuscular re-education;Patient/family education   PT Goals (Current goals can be found in the Care Plan section) Acute Rehab PT Goals Patient Stated Goal: to get back to working PT Goal Formulation: With patient Time For Goal Achievement: 12/24/13 Potential to Achieve Goals: Good    Frequency Min 4X/week   Barriers to discharge        Co-evaluation               End of Session Equipment Utilized During Treatment: Gait belt Activity Tolerance: Patient tolerated treatment well Patient left: in chair;with call bell/phone within reach;with chair alarm set;with family/visitor present Nurse Communication: Mobility status          Time: 2094-7096 PT Time Calculation (min): 20 min   Charges:   PT Evaluation $Initial PT Evaluation Tier I: 1 Procedure PT Treatments $Gait Training: 8-22 mins   PT G CodesGustavus Bryant, [place] 12/17/2013, 1:57 PM

## 2013-12-17 NOTE — Progress Notes (Signed)
STROKE TEAM PROGRESS NOTE   HISTORY AVERLEIGH SAVARY is a 68 y.o. female with a history of hypertension who presents with onset on awakening of left-sided facial droop and right-sided weakness. She states that she is normal on going to bed 12/15/2013 at 11 PM, and then when she awoke 12/16/2013 she noticed that her face didn't feel right. She currently complains of mild weakness on the right side. Patient was not administered TPA secondary to delay in arrival. She was admitted for further evaluation and treatment.   SUBJECTIVE (INTERVAL HISTORY) Her family is at the bedside.  Overall she feels her condition is stable. She does not feel she has completely resolved - she still feels her right leg is heavy and left face is weak. She has not yet worked with PT.    OBJECTIVE Temp:  [97.5 F (36.4 C)-98.6 F (37 C)] 97.5 F (36.4 C) (09/09 0706) Pulse Rate:  [61-73] 67 (09/09 0706) Cardiac Rhythm:  [-] Normal sinus rhythm (09/09 0200) Resp:  [16-18] 18 (09/09 0706) BP: (120-183)/(53-80) 144/55 mmHg (09/09 0706) SpO2:  [97 %-100 %] 98 % (09/09 0706) Weight:  [83.1 kg (183 lb 3.2 oz)] 83.1 kg (183 lb 3.2 oz) (09/08 1305)   Recent Labs Lab 12/16/13 0937 12/16/13 0946 12/16/13 1401  NA 142 143  --   K 4.0 3.8  --   CL 107 109  --   CO2 22  --   --   GLUCOSE 203* 212*  --   BUN 22 22  --   CREATININE 0.89 0.90 0.78  CALCIUM 9.5  --   --     Recent Labs Lab 12/16/13 0937  AST 17  ALT 17  ALKPHOS 54  BILITOT 0.6  PROT 7.0  ALBUMIN 3.9    Recent Labs Lab 12/16/13 0937 12/16/13 0946 12/16/13 1401  WBC 8.8  --  8.3  NEUTROABS 4.7  --   --   HGB 13.6 13.3 13.7  HCT 39.8 39.0 39.7  MCV 87.5  --  87.4  PLT 218  --  226   No results found for this basename: CKTOTAL, CKMB, CKMBINDEX, TROPONINI,  in the last 168 hours  Recent Labs  12/16/13 0937  LABPROT 12.4  INR 0.92    Recent Labs  12/16/13 1120  COLORURINE YELLOW  LABSPEC 1.013  PHURINE 6.5  GLUCOSEU NEGATIVE   HGBUR MODERATE*  BILIRUBINUR NEGATIVE  KETONESUR NEGATIVE  PROTEINUR 30*  UROBILINOGEN 0.2  NITRITE NEGATIVE  LEUKOCYTESUR LARGE*       Component Value Date/Time   CHOL 148 12/17/2013 0353   TRIG 344* 12/17/2013 0353   HDL 32* 12/17/2013 0353   CHOLHDL 4.6 12/17/2013 0353   VLDL 69* 12/17/2013 0353   LDLCALC 47 12/17/2013 0353   Lab Results  Component Value Date   HGBA1C 6.4 11/21/2010      Component Value Date/Time   LABOPIA NONE DETECTED 12/16/2013 1120   COCAINSCRNUR NONE DETECTED 12/16/2013 1120   LABBENZ NONE DETECTED 12/16/2013 1120   AMPHETMU NONE DETECTED 12/16/2013 1120   THCU NONE DETECTED 12/16/2013 1120   LABBARB NONE DETECTED 12/16/2013 1120     Recent Labs Lab 12/16/13 [place] <11    Dg Chest 2 View 12/16/2013    Cardiomegaly.  No active disease.     Ct Head Wo Contrast 12/16/2013   1. No acute intracranial abnormalities. 2. Mild chronic microvascular ischemic change similar to the prior exams.   Mr Brain Wo Contrast 12/16/2013  1. No acute intracranial infarct or other abnormality. 2. Mild chronic small vessel ischemic changes involving the supratentorial white matter, slightly progressed relative to 2012.    Mr Jodene Nam Head/brain Wo Cm 12/16/2013   1. No proximal branch occlusion or hemodynamically significant stenosis identified within the intracranial circulation. 2. Single short segment moderate stenosis within the proximal left posterior cerebral artery.      PHYSICAL EXAM Pleasant elderly Caucasian lady not in distress.Awake alert. Afebrile. Head is nontraumatic. Neck is supple without bruit. Hearing is normal. Cardiac exam no murmur or gallop. Lungs are clear to auscultation. Distal pulses are well felt. Neurological Exam : Awake alert oriented x 3 normal speech and language. Mild left  Upper face and moderate left lower face weakness. Tongue midline. No drift. Mild diminished fine finger movements on left. Orbits right over left upper extremity. Mild right leg weakness at  hip and drift .Marland Kitchen Normal sensation . Normal coordination.Gait deferred ASSESSMENT/PLAN  Ms. BRITTENEY AYOTTE is a 68 y.o. female with history of hypertension presenting with  Left face and right sided weakness-crossed hemiparesis. She did not receive IV t-PA due to delay in arrival. Imaging reports no no acute infarct, however, patient with brainstem stroke not seen on MRI. Stroke work up underway.  Brainstem stroke not seen on MRI, presenting as crossed hemiparesis located in the lower medulla at level of decussation due to left LMN facial weakness and R leg paresis   Deficits have worsened since admission  no antithrombotics prior to admission, now enrolled in [facility]. [facility] is a comparison of 90-day treatment with ticagrelor vs aspirin for the prevention of major vascular events in patients with acute ischaemic stroke or TIA. Please contact [facility] at [phone] for any questions. Ordered for   MRI  Negative for acute stroke, small vessel disease. Repeat MRI with thin sections through brainstem.  MRA  Short segment stenosis prox L PCA, otherwise unremarkable  Carotid Doppler  pending   2D Echo  pending   LDL 47, no statin necessary as meeting goal of LDL <100  SCDs for VTE prophylaxis  Cardiac thin liquids.   Resultant left facial weakness and RLE hemiparesis  Therapy needs:  pending   Ongoing aggressive risk factor management  Risk factor education  Patient counseled to be compliant with her antithrombotic medications  Disposition:  pending   Hypertension   Home meds:  Cozaar, aldactone, zebeta. Not yet resumed in hospital  BP 120-183/55-79 past 24h (12/17/2013 @ 11:48 AM)  Permissive hypertension appropriate for first 24h  Stable  Other Stroke Risk Factors Advanced age ETOH use   Obesity, Body mass index is 31.43 kg/(m^2).   Other Pertinent History  NICM with chronic systolic HF  Asthma  GERD  Hospital day # 1  SHARON  BIBY, MSN, RN, ANVP-BC, ANP-BC, [facility] Pager: [phone] 12/17/2013 11:55 AM  I have personally examined this patient, reviewed notes, independently viewed imaging studies, participated in medical decision making and plan of care. I have made any additions or clarifications directly to the above note. Agree with note above. Patient was at significant risk for neurological worsening and her current strokes. She will benefit by stroke evaluation and aggressive risk factor modification. I had a long discussion with the patient's husband and other family members and answered questions  Antony Contras, MD Medical Director [place] Pager: [phone] 12/17/2013 1:44 PM    To contact Stroke Continuity provider, please refer to [email or web address] After hours,  contact General Neurology

## 2013-12-17 NOTE — Plan of Care (Deleted)
Problem: Acute Rehab OT Goals(only OT should resolve)  Goal: OT Additional Goal #1  Pt will perform eating with modified independence with adaptive utensils sitting.

## 2013-12-17 NOTE — Progress Notes (Signed)
SLP Cancellation Note  Patient Details Name: BOSTYN KUNKLER MRN: [id number] DOB: [birth year]   Cancelled treatment:       Reason Eval/Treat Not Completed: Patient at procedure or test/unavailable   Pedro [place], Katherene Ponto 12/17/2013, 11:15 AM

## 2013-12-17 NOTE — Progress Notes (Signed)
  Echocardiogram 2D Echocardiogram has been performed.  [name] FRANCES 12/17/2013, 11:16 AM

## 2013-12-17 NOTE — Progress Notes (Signed)
[facility] PROGRESS NOTE  [name] QQP:[id number] DOB: 09/24/1945 DOA: 12/16/2013  PCP: Redge Gainer, MD  Brief HPI: 68yo with PMH as below who presented with left facial droop and right sided weakness. She was admitted for stroke work up.  Past medical history:  Past Medical History  Diagnosis Date  . Asthma   . Hypertension   . GERD (gastroesophageal reflux disease)   . History of nephrolithiasis   . Headache(784.0)   . Type II or unspecified type diabetes mellitus without mention of complication, uncontrolled     borderline  . NICM (nonischemic cardiomyopathy)     a. LHC (08/2013):  no CAD  . Chronic systolic heart failure     Echo (08/26/13):  Mild LVH. EF 25% to 30%. Diffuse HK. Aortic valve: There was trivial regurgitation. Mitral valve: There was mild regurgitation. Left atrium: The atrium was mildly dilated.    Consultants: Neurology  Procedures:  2D ECHO Pending  Carotid Dopplers pending  Antibiotics: Ceftriaxone 9/9-->  Subjective: Patient feels that her facial droop is worse. Denies any worsening in her right sided weakness. No other complaints.  Objective: Vital Signs  Filed Vitals:   12/17/13 0000 12/17/13 0200 12/17/13 0535 12/17/13 0706  BP: 150/67 155/63 135/66 144/55  Pulse: 67 71 61 67  Temp: 98.6 F (37 C) 98.5 F (36.9 C) 97.8 F (36.6 C) 97.5 F (36.4 C)  TempSrc: Oral Oral Oral Oral  Resp:   18 18  Height:      Weight:      SpO2:   97% 98%   No intake or output data in the 24 hours ending 12/17/13 0733 Filed Weights   12/16/13 1305  Weight: 83.1 kg (183 lb 3.2 oz)    General appearance: alert, cooperative and no distress Neck: no adenopathy, no carotid bruit, no JVD, supple, symmetrical, trachea midline and thyroid not enlarged, symmetric, no tenderness/mass/nodules Back: symmetric, no curvature. ROM normal. No CVA tenderness. Resp: clear to auscultation bilaterally Cardio: regular rate and rhythm, S1, S2 normal,  no murmur, click, rub or gallop GI: soft, non-tender; bowel sounds normal; no masses,  no organomegaly Extremities: extremities normal, atraumatic, no cyanosis or edema Pulses: 2+ and symmetric Skin: Skin color, texture, turgor normal. No rashes or lesions Neurologic: Left facial droop noted. Inability to close left eye completely. Right sided weakness noted 4/5 upper and LE. Gait not assessed.  Lab Results:  Basic Metabolic Panel:  Recent Labs Lab 12/16/13 0937 12/16/13 0946 12/16/13 1401  NA 142 143  --   K 4.0 3.8  --   CL 107 109  --   CO2 22  --   --   GLUCOSE 203* 212*  --   BUN 22 22  --   CREATININE 0.89 0.90 0.78  CALCIUM 9.5  --   --    Liver Function Tests:  Recent Labs Lab 12/16/13 0937  AST 17  ALT 17  ALKPHOS 54  BILITOT 0.6  PROT 7.0  ALBUMIN 3.9   CBC:  Recent Labs Lab 12/16/13 0937 12/16/13 0946 12/16/13 1401  WBC 8.8  --  8.3  NEUTROABS 4.7  --   --   HGB 13.6 13.3 13.7  HCT 39.8 39.0 39.7  MCV 87.5  --  87.4  PLT 218  --  226    Studies/Results: Dg Chest 2 View  12/16/2013   CLINICAL DATA:  Stroke.  No chest pain or shortness of breath.  EXAM: CHEST  2 VIEW  COMPARISON:  07/30/2013  FINDINGS: Cardiomegaly. No confluent airspace opacities, effusions or edema. No acute bony abnormality.  IMPRESSION: Cardiomegaly.  No active disease.   Electronically Signed   By: Rolm Baptise M.D.   On: 12/16/2013 16:59   Ct Head Wo Contrast  12/16/2013   CLINICAL DATA:  Code stroke.  Right-sided weakness.  EXAM: CT HEAD WITHOUT CONTRAST  TECHNIQUE: Contiguous axial images were obtained from the base of the skull through the vertex without intravenous contrast.  COMPARISON:  Brain MRI, 04/25/2010.  Head CT, 04/24/2010  FINDINGS: Ventricles are normal in size and configuration.  There are no parenchymal masses or mass effect. There is no evidence of an infarct. Mild periventricular white matter hypoattenuation is noted consistent with chronic microvascular  ischemic change, similar to the prior study.  No extra-axial masses or abnormal fluid collections.  No intracranial hemorrhage.  Visualized sinuses and mastoid air cells are clear. No skull lesion.  IMPRESSION: 1. No acute intracranial abnormalities. 2. Mild chronic microvascular ischemic change similar to the prior exams. These results were called by telephone at the time of interpretation on 12/16/2013 at 9:57 am to Dr. Roland Rack , who verbally acknowledged these results.   Electronically Signed   By: Lajean Manes M.D.   On: 12/16/2013 09:57   Mr Brain Wo Contrast  12/16/2013   CLINICAL DATA:  Left-sided facial droop, right-sided weakness. Evaluate for stroke (suspect ventral pontine infarct).  EXAM: MRI HEAD WITHOUT CONTRAST  MRA HEAD WITHOUT CONTRAST  TECHNIQUE: Multiplanar, multiecho pulse sequences of the brain and surrounding structures were obtained without intravenous contrast. Angiographic images of the head were obtained using MRA technique without contrast.  COMPARISON:  Prior head CT performed earlier on the same day as well as previous MRI from 04/25/2010.  FINDINGS: MRI HEAD FINDINGS  The CSF containing spaces are within normal limits for patient age. Mild chronic microvascular ischemic changes involving the supratentorial white matter present, slightly progressed relative to 2012. No mass lesion, midline shift, or extra-axial fluid collection. Ventricles are normal in size without evidence of hydrocephalus.  No diffusion-weighted signal abnormality is identified to suggest acute intracranial infarct. Specifically, no infarcts seen involving the pons. Gray-white matter differentiation is maintained. Normal flow voids are seen within the intracranial vasculature. No intracranial hemorrhage identified.  The cervicomedullary junction is normal. Mild degenerative changes noted within the visualized upper cervical spine. Pituitary gland is within normal limits. Pituitary stalk is midline. The  globes and optic nerves demonstrate a normal appearance with normal signal intensity.  The bone marrow signal intensity is normal. Calvarium is intact. Visualized upper cervical spine is within normal limits.  Scalp soft tissues are unremarkable.  Paranasal sinuses are clear.  No mastoid effusion.  MRA HEAD FINDINGS  ANTERIOR CIRCULATION:  The visualized portions of the distal cervical internal carotid arteries are widely patent with antegrade flow. Petrous and cavernous segments are widely patent without hemodynamically significant stenosis. Supra clinoid segments unremarkable. A1 segments, anterior communicating artery, and anterior cerebral arteries are well within normal limits.  Minimal atherosclerotic irregularity noted within the M1 segments bilaterally without hemodynamically significant stenosis or proximal branch occlusion. MCA bifurcations and distal MCA branch branches are unremarkable.  No aneurysm within the anterior circulation.  POSTERIOR CIRCULATION:  The vertebral arteries are codominant with widely patent antegrade flow. Posterior inferior cerebral arteries within normal limits. Vertebrobasilar junction and basilar artery are well opacified without high-grade stenosis or other acute abnormality. Superior cerebellar arteries within normal limits. There is a single  short segment moderate stenosis within the proximal left posterior cerebral artery (series 506, image 12). No other significant stenosis noted within the posterior circulation. Prominent left posterior communicating artery present.  No aneurysm within the posterior circulation.  IMPRESSION: MRI HEAD IMPRESSION:  1. No acute intracranial infarct or other abnormality. 2. Mild chronic small vessel ischemic changes involving the supratentorial white matter, slightly progressed relative to 2012.  MRA HEAD IMPRESSION:  1. No proximal branch occlusion or hemodynamically significant stenosis identified within the intracranial circulation. 2.  Single short segment moderate stenosis within the proximal left posterior cerebral artery.   Electronically Signed   By: Jeannine Boga M.D.   On: 12/16/2013 23:08   Mr Jodene Nam Head/brain Wo Cm  12/16/2013   CLINICAL DATA:  Left-sided facial droop, right-sided weakness. Evaluate for stroke (suspect ventral pontine infarct).  EXAM: MRI HEAD WITHOUT CONTRAST  MRA HEAD WITHOUT CONTRAST  TECHNIQUE: Multiplanar, multiecho pulse sequences of the brain and surrounding structures were obtained without intravenous contrast. Angiographic images of the head were obtained using MRA technique without contrast.  COMPARISON:  Prior head CT performed earlier on the same day as well as previous MRI from 04/25/2010.  FINDINGS: MRI HEAD FINDINGS  The CSF containing spaces are within normal limits for patient age. Mild chronic microvascular ischemic changes involving the supratentorial white matter present, slightly progressed relative to 2012. No mass lesion, midline shift, or extra-axial fluid collection. Ventricles are normal in size without evidence of hydrocephalus.  No diffusion-weighted signal abnormality is identified to suggest acute intracranial infarct. Specifically, no infarcts seen involving the pons. Gray-white matter differentiation is maintained. Normal flow voids are seen within the intracranial vasculature. No intracranial hemorrhage identified.  The cervicomedullary junction is normal. Mild degenerative changes noted within the visualized upper cervical spine. Pituitary gland is within normal limits. Pituitary stalk is midline. The globes and optic nerves demonstrate a normal appearance with normal signal intensity.  The bone marrow signal intensity is normal. Calvarium is intact. Visualized upper cervical spine is within normal limits.  Scalp soft tissues are unremarkable.  Paranasal sinuses are clear.  No mastoid effusion.  MRA HEAD FINDINGS  ANTERIOR CIRCULATION:  The visualized portions of the distal cervical  internal carotid arteries are widely patent with antegrade flow. Petrous and cavernous segments are widely patent without hemodynamically significant stenosis. Supra clinoid segments unremarkable. A1 segments, anterior communicating artery, and anterior cerebral arteries are well within normal limits.  Minimal atherosclerotic irregularity noted within the M1 segments bilaterally without hemodynamically significant stenosis or proximal branch occlusion. MCA bifurcations and distal MCA branch branches are unremarkable.  No aneurysm within the anterior circulation.  POSTERIOR CIRCULATION:  The vertebral arteries are codominant with widely patent antegrade flow. Posterior inferior cerebral arteries within normal limits. Vertebrobasilar junction and basilar artery are well opacified without high-grade stenosis or other acute abnormality. Superior cerebellar arteries within normal limits. There is a single short segment moderate stenosis within the proximal left posterior cerebral artery (series 506, image 12). No other significant stenosis noted within the posterior circulation. Prominent left posterior communicating artery present.  No aneurysm within the posterior circulation.  IMPRESSION: MRI HEAD IMPRESSION:  1. No acute intracranial infarct or other abnormality. 2. Mild chronic small vessel ischemic changes involving the supratentorial white matter, slightly progressed relative to 2012.  MRA HEAD IMPRESSION:  1. No proximal branch occlusion or hemodynamically significant stenosis identified within the intracranial circulation. 2. Single short segment moderate stenosis within the proximal left posterior cerebral artery.   Electronically  Signed   By: Jeannine Boga M.D.   On: 12/16/2013 23:08    Medications:  Scheduled: . cefTRIAXone (ROCEPHIN)  IV  1 g Intravenous Q24H  . research study medication  100 mg Oral Daily  . research study medication  90 mg Oral BID   Continuous:  OZH:YQMVHQION, polyvinyl  alcohol  Assessment/Plan:  Active Problems:   HYPERTENSION   ASTHMA   CVA (cerebral infarction)   Stroke-like symptoms    Stroke-like symptoms with Left facial droop and Right sided Weakness MRI did not reveal any stroke. Await neurology input. Patient reports worsening in symptoms today. On research protocol for stroke. PT/OT. Carotid dopplers and ECHO are pending. Consider starting statin.  History of Asthma  Stable, prn albuterol   History of Hypertension  Allowing permissive HTN. Holding her medications.   UTI Urine culture. Start Ceftriaxone. Patient did report some dysuria over last few days.  DVT Prophylaxis: On research protocol    Code Status: Full Code  Family Communication: No family at bedside  Disposition Plan: Not ready for DC.    LOS: 1 day   [facility] Pager [phone] 12/17/2013, 7:33 AM  If 8PM-8AM, please contact night-coverage at [email or web address], password [email or web address]   Disclaimer: This note was dictated with voice recognition software. Similar sounding words can inadvertently be transcribed and may not be corrected upon review.

## 2013-12-18 LAB — BASIC METABOLIC PANEL
Anion gap: 13 (ref 5–15)
BUN: 17 mg/dL (ref 6–23)
CO2: 23 mEq/L (ref 19–32)
Calcium: 8.7 mg/dL (ref 8.4–10.5)
Chloride: 107 mEq/L (ref 96–112)
Creatinine, Ser: 0.78 mg/dL (ref 0.50–1.10)
GFR calc Af Amer: >90 mL/min (ref >90)
GFR calc non Af Amer: 83 mL/min — ABNORMAL LOW (ref >90)
Glucose, Bld: 150 mg/dL — ABNORMAL HIGH (ref 70–99)
Potassium: 3.8 mEq/L (ref 3.7–5.3)
Sodium: 143 mEq/L (ref 137–147)

## 2013-12-18 MED ORDER — CEPHALEXIN 500 MG PO CAPS: 500.0000 mg | ORAL_CAPSULE | Freq: Three times a day (TID) | ORAL | Status: DC

## 2013-12-18 MED ORDER — STUDY - INVESTIGATIONAL DRUG SIMPLE RECORD: 100.0000 mg | Freq: Every day | Status: DC

## 2013-12-18 MED ORDER — LOSARTAN POTASSIUM 50 MG PO TABS
100.0000 mg | ORAL_TABLET | Freq: Every day | ORAL | Status: DC
  Administered 2013-12-18: 100 mg via ORAL
  Filled 2013-12-18: qty 2

## 2013-12-18 MED ORDER — STUDY - INVESTIGATIONAL DRUG SIMPLE RECORD: 90.0000 mg | Freq: Two times a day (BID) | Status: DC

## 2013-12-18 MED ORDER — BISOPROLOL FUMARATE 5 MG PO TABS
5.0000 mg | ORAL_TABLET | Freq: Every day | ORAL | Status: DC
  Administered 2013-12-18: 5 mg via ORAL
  Filled 2013-12-18: qty 1

## 2013-12-18 NOTE — Care Management Note (Addendum)
  Page 1 of 1   12/18/2013     2:01:38 PM CARE MANAGEMENT NOTE 12/18/2013  Patient:  [name], [name]   Account Number:  [id number]  Date Initiated:  12/18/2013  Documentation initiated by:  Lorne Skeens  Subjective/Objective Assessment:   patient was admitted with CVA, UTI. Lives at home with children     Action/Plan:   Will follow for discharge needs pending PT/OT evals and physician orders.   Anticipated DC Date:     Anticipated DC Plan:  HOME/SELF CARE      DC Planning Services  CM consult  OP Neuro Rehab      Choice offered to / List presented to:  C-1 Patient           Status of service:  In process, will continue to follow Medicare Important Message given?  NA - LOS <3 / Initial given by admissions (If response is "NO", the following Medicare IM given date fields will be blank) Date Medicare IM given:   Medicare IM given by:   Date Additional Medicare IM given:   Additional Medicare IM given by:    Discharge Disposition:  HOME/SELF CARE  Per UR Regulation:  Reviewed for med. necessity/level of care/duration of stay  If discussed at Centre of Stay Meetings, dates discussed:    Comments:  12/18/13 [place], MSN, CM- Met with patient to discuss outpatient neuro rehab.  Patient is agreeable and has chosen to use [facility]. CM spoke with Angie at neuro rehab to verify that referral was recieved. Patient declines recommended DME at this time. RN updated.

## 2013-12-18 NOTE — Progress Notes (Signed)
Inpatient Diabetes Program Recommendations  AACE/ADA: New Consensus Statement on Inpatient Glycemic Control (2013)  Target Ranges:  Prepandial:   less than 140 mg/dL      Peak postprandial:   less than 180 mg/dL (1-2 hours)      Critically ill patients:  140 - 180 mg/dL   Results for [name], [name] (MRN [id number]) as of 12/18/2013 11:45  Ref. Range 12/16/2013 09:37 12/16/2013 09:46 12/18/2013 05:38  Glucose Latest Range: 70-99 mg/dL 203 (H) 212 (H) 150 (H)    Reason for Assessment: elevated glucose  Diabetes history: none noted Outpatient Diabetes medications: none Current orders for Inpatient glycemic control: none  A1C elevated- 7.9%; is this a new diagnosis? Does the diagnosis need to be added to problem list?  Please add carb modified to the current heart healthy diet.  Consider starting Lantus insulin, 15 units Qday (83kg x 0.2 = 16.6)  Gentry Fitz, RN, [place], [place], CDE Diabetes Coordinator Inpatient Diabetes Program  [phone] (Team Pager) [phone] [facility]) 12/18/2013 11:57 AM

## 2013-12-18 NOTE — Discharge Instructions (Signed)
STROKE/TIA DISCHARGE INSTRUCTIONS SMOKING Cigarette smoking nearly doubles your risk of having a stroke & is the single most alterable risk factor  If you smoke or have smoked in the last 12 months, you are advised to quit smoking for your health.  Most of the excess cardiovascular risk related to smoking disappears within a year of stopping.  Ask you doctor about anti-smoking medications  [place] Quit Line: 1-800-QUIT NOW  Free Smoking Cessation Classes [phone]  CHOLESTEROL Know your levels; limit fat & cholesterol in your diet  Lipid Panel     Component Value Date/Time   CHOL 148 12/17/2013 0353   TRIG 344* 12/17/2013 0353   HDL 32* 12/17/2013 0353   CHOLHDL 4.6 12/17/2013 0353   VLDL 69* 12/17/2013 0353   LDLCALC 47 12/17/2013 0353      Many patients benefit from treatment even if their cholesterol is at goal.  Goal: Total Cholesterol (CHOL) less than 160  Goal:  Triglycerides (TRIG) less than 150  Goal:  HDL greater than 40  Goal:  LDL (LDLCALC) less than 100   BLOOD PRESSURE American Stroke Association blood pressure target is less that 120/80 mm/Hg  Your discharge blood pressure is:  BP: 158/76 mmHg  Monitor your blood pressure  Limit your salt and alcohol intake  Many individuals will require more than one medication for high blood pressure  DIABETES (A1c is a blood sugar average for last 3 months) Goal HGBA1c is under 7% (HBGA1c is blood sugar average for last 3 months)  Diabetes: Newly diagnosed DM  Lab Results  Component Value Date   HGBA1C 7.9* 12/17/2013     Your HGBA1c can be lowered with medications, healthy diet, and exercise.  Check your blood sugar as directed by your physician  Call your physician if you experience unexplained or low blood sugars.  PHYSICAL ACTIVITY/REHABILITATION Goal is 30 minutes at least 4 days per week  Activity: No driving, Therapies: Physical Therapy: Outpatient and Speech Therapy: Outpatient Return to work: To be determined   Activity decreases your risk of heart attack and stroke and makes your heart stronger.  It helps control your weight and blood pressure; helps you relax and can improve your mood.  Participate in a regular exercise program.  Talk with your doctor about the best form of exercise for you (dancing, walking, swimming, cycling).  DIET/WEIGHT Goal is to maintain a healthy weight  Your discharge diet is: Cardiac thin liquids Your height is:  Height: 5\' 4"  (162.6 cm) Your current weight is: Weight: 83.1 kg (183 lb 3.2 oz) Your Body Mass Index (BMI) is:  BMI (Calculated): 31.5  Following the type of diet specifically designed for you will help prevent another stroke.  Your goal weight range is:  60-65kg  Your goal Body Mass Index (BMI) is 19-24.  Healthy food habits can help reduce 3 risk factors for stroke:  High cholesterol, hypertension, and excess weight.  RESOURCES Stroke/Support Group:  Call [phone]   STROKE EDUCATION PROVIDED/REVIEWED AND GIVEN TO PATIENT Stroke warning signs and symptoms How to activate emergency medical system (call 911). Medications prescribed at discharge. Need for follow-up after discharge. Personal risk factors for stroke. Pneumonia vaccine given: No Flu vaccine given: No My questions have been answered, the writing is legible, and I understand these instructions.  I will adhere to these goals & educational materials that have been provided to me after my discharge from the hospital.     Type 2 Diabetes Mellitus Type 2 diabetes mellitus,  often simply referred to as type 2 diabetes, is a long-lasting (chronic) disease. In type 2 diabetes, the pancreas does not make enough insulin (a hormone), the cells are less responsive to the insulin that is made (insulin resistance), or both. Normally, insulin moves sugars from food into the tissue cells. The tissue cells use the sugars for energy. The lack of insulin or the lack of normal response to insulin causes  excess sugars to build up in the blood instead of going into the tissue cells. As a result, high blood sugar (hyperglycemia) develops. The effect of high sugar (glucose) levels can cause many complications. Type 2 diabetes was also previously called adult-onset diabetes, but it can occur at any age.  RISK FACTORS  A person is predisposed to developing type 2 diabetes if someone in the family has the disease and also has one or more of the following primary risk factors:  Overweight.  An inactive lifestyle.  A history of consistently eating high-calorie foods. Maintaining a normal weight and regular physical activity can reduce the chance of developing type 2 diabetes. SYMPTOMS  A person with type 2 diabetes may not show symptoms initially. The symptoms of type 2 diabetes appear slowly. The symptoms include:  Increased thirst (polydipsia).  Increased urination (polyuria).  Increased urination during the night (nocturia).  Weight loss. This weight loss may be rapid.  Frequent, recurring infections.  Tiredness (fatigue).  Weakness.  Vision changes, such as blurred vision.  Fruity smell to your breath.  Abdominal pain.  Nausea or vomiting.  Cuts or bruises which are slow to heal.  Tingling or numbness in the hands or feet. DIAGNOSIS Type 2 diabetes is frequently not diagnosed until complications of diabetes are present. Type 2 diabetes is diagnosed when symptoms or complications are present and when blood glucose levels are increased. Your blood glucose level may be checked by one or more of the following blood tests:  A fasting blood glucose test. You will not be allowed to eat for at least 8 hours before a blood sample is taken.  A random blood glucose test. Your blood glucose is checked at any time of the day regardless of when you ate.  A hemoglobin A1c blood glucose test. A hemoglobin A1c test provides information about blood glucose control over the previous 3  months.  An oral glucose tolerance test (OGTT). Your blood glucose is measured after you have not eaten (fasted) for 2 hours and then after you drink a glucose-containing beverage. TREATMENT   You may need to take insulin or diabetes medicine daily to keep blood glucose levels in the desired range.  If you use insulin, you may need to adjust the dosage depending on the carbohydrates that you eat with each meal or snack. The treatment goal is to maintain the before meal blood sugar (preprandial glucose) level at 70-130 mg/dL. HOME CARE INSTRUCTIONS   Have your hemoglobin A1c level checked twice a year.  Perform daily blood glucose monitoring as directed by your health care provider.  Monitor urine ketones when you are ill and as directed by your health care provider.  Take your diabetes medicine or insulin as directed by your health care provider to maintain your blood glucose levels in the desired range.  Never run out of diabetes medicine or insulin. It is needed every day.  If you are using insulin, you may need to adjust the amount of insulin given based on your intake of carbohydrates. Carbohydrates can raise blood glucose levels  but need to be included in your diet. Carbohydrates provide vitamins, minerals, and fiber which are an essential part of a healthy diet. Carbohydrates are found in fruits, vegetables, whole grains, dairy products, legumes, and foods containing added sugars.  Eat healthy foods. You should make an appointment to see a registered dietitian to help you create an eating plan that is right for you.  Lose weight if you are overweight.  Carry a medical alert card or wear your medical alert jewelry.  Carry a 15-gram carbohydrate snack with you at all times to treat low blood glucose (hypoglycemia). Some examples of 15-gram carbohydrate snacks include:  Glucose tablets, 3 or 4.  Glucose gel, 15-gram tube.  Raisins, 2 tablespoons (24 grams).  Jelly beans,  6.  Animal crackers, 8.  Regular pop, 4 ounces (120 mL).  Gummy treats, 9.  Recognize hypoglycemia. Hypoglycemia occurs with blood glucose levels of 70 mg/dL and below. The risk for hypoglycemia increases when fasting or skipping meals, during or after intense exercise, and during sleep. Hypoglycemia symptoms can include:  Tremors or shakes.  Decreased ability to concentrate.  Sweating.  Increased heart rate.  Headache.  Dry mouth.  Hunger.  Irritability.  Anxiety.  Restless sleep.  Altered speech or coordination.  Confusion.  Treat hypoglycemia promptly. If you are alert and able to safely swallow, follow the 15:15 rule:  Take 15-20 grams of rapid-acting glucose or carbohydrate. Rapid-acting options include glucose gel, glucose tablets, or 4 ounces (120 mL) of fruit juice, regular soda, or low-fat milk.  Check your blood glucose level 15 minutes after taking the glucose.  Take 15-20 grams more of glucose if the repeat blood glucose level is still 70 mg/dL or below.  Eat a meal or snack within 1 hour once blood glucose levels return to normal.  Be alert to feeling very thirsty and urinating more frequently than usual, which are early signs of hyperglycemia. An early awareness of hyperglycemia allows for prompt treatment. Treat hyperglycemia as directed by your health care provider.  Engage in at least 150 minutes of moderate-intensity physical activity a week, spread over at least 3 days of the week or as directed by your health care provider. In addition, you should engage in resistance exercise at least 2 times a week or as directed by your health care provider. Try to spend no more than 90 minutes at one time inactive.  Adjust your medicine and food intake as needed if you start a new exercise or sport.  Follow your sick-day plan anytime you are unable to eat or drink as usual.  Do not use any tobacco products including cigarettes, chewing tobacco, or  electronic cigarettes. If you need help quitting, ask your health care provider.  Limit alcohol intake to no more than 1 drink per day for nonpregnant women and 2 drinks per day for men. You should drink alcohol only when you are also eating food. Talk with your health care provider whether alcohol is safe for you. Tell your health care provider if you drink alcohol several times a week.  Keep all follow-up visits as directed by your health care provider. This is important.  Schedule an eye exam soon after the diagnosis of type 2 diabetes and then annually.  Perform daily skin and foot care. Examine your skin and feet daily for cuts, bruises, redness, nail problems, bleeding, blisters, or sores. A foot exam by a health care provider should be done annually.  Brush your teeth and gums at least  twice a day and floss at least once a day. Follow up with your dentist regularly.  Share your diabetes management plan with your workplace or school.  Stay up-to-date with immunizations. It is recommended that people with diabetes who are over 64 years old get the pneumonia vaccine. In some cases, two separate shots may be given. Ask your health care provider if your pneumonia vaccination is up-to-date.  Learn to manage stress.  Obtain ongoing diabetes education and support as needed.  Participate in or seek rehabilitation as needed to maintain or improve independence and quality of life. Request a physical or occupational therapy referral if you are having foot or hand numbness, or difficulties with grooming, dressing, eating, or physical activity. SEEK MEDICAL CARE IF:   You are unable to eat food or drink fluids for more than 6 hours.  You have nausea and vomiting for more than 6 hours.  Your blood glucose level is over 240 mg/dL.  There is a change in mental status.  You develop an additional serious illness.  You have diarrhea for more than 6 hours.  You have been sick or have had a fever  for a couple of days and are not getting better.  You have pain during any physical activity.  SEEK IMMEDIATE MEDICAL CARE IF:  You have difficulty breathing.  You have moderate to large ketone levels. MAKE SURE YOU:  Understand these instructions.  Will watch your condition.  Will get help right away if you are not doing well or get worse. Document Released: 03/27/2005 Document Revised: 08/11/2013 Document Reviewed: 10/24/2011 [facility] Patient Information 2015 [place], [place]. This information is not intended to replace advice given to you by your health care provider. Make sure you discuss any questions you have with your health care provider.

## 2013-12-18 NOTE — Progress Notes (Signed)
STROKE TEAM PROGRESS NOTE   HISTORY [name] is a 68 y.o. female with a history of hypertension who presents with onset on awakening of left-sided facial droop and right-sided weakness. She states that she is normal on going to bed 12/15/2013 at 11 PM, and then when she awoke 12/16/2013 she noticed that her face didn't feel right. She currently complains of mild weakness on the right side. Patient was not administered TPA secondary to delay in arrival. She was admitted for further evaluation and treatment.   SUBJECTIVE (INTERVAL HISTORY) Her family is at the bedside.  Overall she feels her condition is stable. She does not feel she has completely resolved - she still feels her right leg is heavy and left face is weak. She has   worked with PT. And is being discharged today. Repeat MRI yet does not show brainstem infarct   OBJECTIVE Temp:  [96.8 F (36 C)-98.5 F (36.9 C)] 96.8 F (36 C) (09/10 1007) Pulse Rate:  [68-94] 94 (09/10 1007) Cardiac Rhythm:  [-] Normal sinus rhythm (09/10 0830) Resp:  [18-20] 20 (09/10 1007) BP: (129-161)/(58-89) 158/76 mmHg (09/10 1007) SpO2:  [94 %-99 %] 98 % (09/10 1007)   Recent Labs Lab 12/16/13 0937 12/16/13 0946 12/16/13 1401 12/18/13 0538  NA 142 143  --  143  K 4.0 3.8  --  3.8  CL 107 109  --  107  CO2 22  --   --  23  GLUCOSE 203* 212*  --  150*  BUN 22 22  --  17  CREATININE 0.89 0.90 0.78 0.78  CALCIUM 9.5  --   --  8.7    Recent Labs Lab 12/16/13 0937  AST 17  ALT 17  ALKPHOS 54  BILITOT 0.6  PROT 7.0  ALBUMIN 3.9    Recent Labs Lab 12/16/13 0937 12/16/13 0946 12/16/13 1401  WBC 8.8  --  8.3  NEUTROABS 4.7  --   --   HGB 13.6 13.3 13.7  HCT 39.8 39.0 39.7  MCV 87.5  --  87.4  PLT 218  --  226   No results found for this basename: CKTOTAL, CKMB, CKMBINDEX, TROPONINI,  in the last 168 hours  Recent Labs  12/16/13 0937  LABPROT 12.4  INR 0.92    Recent Labs  12/16/13 1120  COLORURINE YELLOW  LABSPEC  1.013  PHURINE 6.5  GLUCOSEU NEGATIVE  HGBUR MODERATE*  BILIRUBINUR NEGATIVE  KETONESUR NEGATIVE  PROTEINUR 30*  UROBILINOGEN 0.2  NITRITE NEGATIVE  LEUKOCYTESUR LARGE*       Component Value Date/Time   CHOL 148 12/17/2013 0353   TRIG 344* 12/17/2013 0353   HDL 32* 12/17/2013 0353   CHOLHDL 4.6 12/17/2013 0353   VLDL 69* 12/17/2013 0353   LDLCALC 47 12/17/2013 0353   Lab Results  Component Value Date   HGBA1C 7.9* 12/17/2013      Component Value Date/Time   LABOPIA NONE DETECTED 12/16/2013 1120   COCAINSCRNUR NONE DETECTED 12/16/2013 1120   LABBENZ NONE DETECTED 12/16/2013 1120   AMPHETMU NONE DETECTED 12/16/2013 1120   THCU NONE DETECTED 12/16/2013 1120   LABBARB NONE DETECTED 12/16/2013 1120     Recent Labs Lab 12/16/13 [place] <11    Dg Chest 2 View 12/16/2013    Cardiomegaly.  No active disease.     Ct Head Wo Contrast 12/16/2013   1. No acute intracranial abnormalities. 2. Mild chronic microvascular ischemic change similar to the prior exams.   Mr  Brain Wo Contrast 12/16/2013   1. No acute intracranial infarct or other abnormality. 2. Mild chronic small vessel ischemic changes involving the supratentorial white matter, slightly progressed relative to 2012.    Mr Jodene Nam Head/brain Wo Cm 12/16/2013   1. No proximal branch occlusion or hemodynamically significant stenosis identified within the intracranial circulation. 2. Single short segment moderate stenosis within the proximal left posterior cerebral artery.      PHYSICAL EXAM Pleasant elderly Caucasian lady not in distress.Awake alert. Afebrile. Head is nontraumatic. Neck is supple without bruit. Hearing is normal. Cardiac exam no murmur or gallop. Lungs are clear to auscultation. Distal pulses are well felt. Neurological Exam : Awake alert oriented x 3 normal speech and language. Mild left  Upper face and moderate left lower face weakness. Tongue midline. No drift. Mild diminished fine finger movements on left. Orbits right over left upper  extremity. Mild right leg weakness at hip and drift .Marland Kitchen Normal sensation . Normal coordination.Gait deferred ASSESSMENT/PLAN  [name] is a 68 y.o. female with history of hypertension presenting with  Left face and right sided weakness-crossed hemiparesis. She did not receive IV t-PA due to delay in arrival. Imaging reports no no acute infarct, however, patient with brainstem stroke not seen on MRI. Stroke work up underway.  Brainstem stroke not seen on MRI x 2, presenting as crossed hemiparesis located in the lower medulla at level of decussation due to left LMN facial weakness and R leg paresis   Deficits have worsened since admission  no antithrombotics prior to admission, now enrolled in [facility]. Socrates is a comparison of 90-day treatment with ticagrelor vs aspirin for the prevention of major vascular events in patients with acute ischaemic stroke or TIA. Please contact [facility] at [phone] for any questions. Ordered for   MRI  Negative for acute stroke, small vessel disease. Repeat MRI with thin sections through brainstem.  MRA  Short segment stenosis prox L PCA, otherwise unremarkable  Carotid Doppler  pending   2D Echo  pending   LDL 47, no statin necessary as meeting goal of LDL <100  SCDs for VTE prophylaxis  Cardiac thin liquids.   Resultant left facial weakness and RLE hemiparesis  Therapy needs:  outpatient  Ongoing aggressive risk factor management  Risk factor education  Patient counseled to be compliant with her antithrombotic medications  Disposition:  home  Hypertension   Home meds:  Cozaar, aldactone, zebeta. Not yet resumed in hospital  BP 120-183/55-79 past 24h (12/18/2013 @ 1:45 PM)  Permissive hypertension appropriate for first 24h  Stable  Other Stroke Risk Factors Advanced age ETOH use   Obesity, Body mass index is 31.43 kg/(m^2).   Other Pertinent History  NICM with chronic systolic  HF  Asthma  GERD  Hospital day # 2     Felicia Contras, MD Medical Director [facility] Pager: [phone] 12/18/2013 1:45 PM    To contact Stroke Continuity provider, please refer to [email or web address] After hours, contact General Neurology

## 2013-12-18 NOTE — Progress Notes (Signed)
Patient d/ced. Educated on the s/s of stroke and the need to follow up with the neurologist and PCP. Assessments remains unchanged as at now.

## 2013-12-18 NOTE — Progress Notes (Signed)
UR complete.  Ramirez Fullbright RN, MSN 

## 2013-12-18 NOTE — Discharge Summary (Signed)
[facility]  Physician Discharge Summary   Patient ID: [name] MRN: [id number] DOB/AGE: January 23, 1946 68 y.o.  Admit date: 12/16/2013 Discharge date: 12/18/2013  PCP: Redge Gainer, MD  DISCHARGE DIAGNOSES:  Principal Problem:   Stroke-like symptoms Active Problems:   HYPERTENSION   ASTHMA   CVA (cerebral infarction)   UTI (urinary tract infection)   DM type 2 (diabetes mellitus, type 2)   RECOMMENDATIONS FOR OUTPATIENT FOLLOW UP: 1. Needs close follow up for newly diagnosed diabetes. Not being placed on medication at this time. She will talk to her PCP. 2. Outpatient PT and speech therapy 3. She will need to follow up at the neurology research clinic  DISCHARGE CONDITION: fair  Diet recommendation: Mod Carb/Heart Healthy  Filed Weights   12/16/13 1305  Weight: 83.1 kg (183 lb 3.2 oz)    INITIAL HISTORY: 68yo with PMH as below who presented with left facial droop and right sided weakness. She was admitted for stroke work up.  Consultations:  Neurology  Procedures: 2D ECHO Study Conclusions - Left ventricle: The cavity size was normal. There was mild concentric hypertrophy. Systolic function was moderately to severely reduced. The estimated ejection fraction was in the range of 30% to 35%. Diffuse hypokinesis. Doppler parameters are consistent with abnormal left ventricular relaxation (grade 1 diastolic dysfunction). Doppler parameters are consistent with elevated mean left atrial filling pressure. No evidence of thrombus. - Left atrium: The atrium was mildly dilated. - Pericardium, extracardiac: A trivial pericardial effusion was identified.  Carotid Dopplers No significant stenosis  HOSPITAL COURSE:   Stroke-like symptoms with Left facial droop and Right sided Weakness  MRI did not reveal any stroke. She was seen by neurology. MRI was repeated with thinner slices and again no stroke was seen. Bells palsy was considered by patient had right sided  weakness which goes against bell palsy. Despite negative MRI she is thought to have brainstem stroke. She was placed on a research study protocol per neurology for anti-platelet therapy. She was seen by PT and OT and outpatient therapy was recommended. Other work up including ECHo and carotid dopplers are as above. LDL was 47 and doesn't need statin per neurology. TG was high at 344 and she will need dietary counseling.  Newly Diagnosed DM2 HBA1c was 7.9. She has been told that she has prediabetes. She has been asked to follow up with her PCP to discuss further management. Metformin will be an ideal agent for her. But she will discuss this with her doctor.  History of Asthma  Stable, prn albuterol   History of Hypertension  Resume home medications.   UTI  Urine culture is pending. She was started on Ceftriaxone. She will be discharged on Keflex.   Patient is stable. She will be discharged home with sister.    PERTINENT LABS:  The results of significant diagnostics from this hospitalization (including imaging, microbiology, ancillary and laboratory) are listed below for reference.     Labs: Basic Metabolic Panel:  Recent Labs Lab 12/16/13 0937 12/16/13 0946 12/16/13 1401 12/18/13 0538  NA 142 143  --  143  K 4.0 3.8  --  3.8  CL 107 109  --  107  CO2 22  --   --  23  GLUCOSE 203* 212*  --  150*  BUN 22 22  --  17  CREATININE 0.89 0.90 0.78 0.78  CALCIUM 9.5  --   --  8.7   Liver Function Tests:  Recent Labs Lab 12/16/13 [phone]  AST 17  ALT 17  ALKPHOS 54  BILITOT 0.6  PROT 7.0  ALBUMIN 3.9   CBC:  Recent Labs Lab 12/16/13 0937 12/16/13 0946 12/16/13 1401  WBC 8.8  --  8.3  NEUTROABS 4.7  --   --   HGB 13.6 13.3 13.7  HCT 39.8 39.0 39.7  MCV 87.5  --  87.4  PLT 218  --  226     IMAGING STUDIES Dg Chest 2 View  12/16/2013   CLINICAL DATA:  Stroke.  No chest pain or shortness of breath.  EXAM: CHEST  2 VIEW  COMPARISON:  07/30/2013  FINDINGS:  Cardiomegaly. No confluent airspace opacities, effusions or edema. No acute bony abnormality.  IMPRESSION: Cardiomegaly.  No active disease.   Electronically Signed   By: Rolm Baptise M.D.   On: 12/16/2013 16:59   Ct Head Wo Contrast  12/16/2013   CLINICAL DATA:  Code stroke.  Right-sided weakness.  EXAM: CT HEAD WITHOUT CONTRAST  TECHNIQUE: Contiguous axial images were obtained from the base of the skull through the vertex without intravenous contrast.  COMPARISON:  Brain MRI, 04/25/2010.  Head CT, 04/24/2010  FINDINGS: Ventricles are normal in size and configuration.  There are no parenchymal masses or mass effect. There is no evidence of an infarct. Mild periventricular white matter hypoattenuation is noted consistent with chronic microvascular ischemic change, similar to the prior study.  No extra-axial masses or abnormal fluid collections.  No intracranial hemorrhage.  Visualized sinuses and mastoid air cells are clear. No skull lesion.  IMPRESSION: 1. No acute intracranial abnormalities. 2. Mild chronic microvascular ischemic change similar to the prior exams. These results were called by telephone at the time of interpretation on 12/16/2013 at 9:57 am to Dr. Roland Rack , who verbally acknowledged these results.   Electronically Signed   By: Lajean Manes M.D.   On: 12/16/2013 09:57   Mr Brain Wo Contrast  12/16/2013   CLINICAL DATA:  Left-sided facial droop, right-sided weakness. Evaluate for stroke (suspect ventral pontine infarct).  EXAM: MRI HEAD WITHOUT CONTRAST  MRA HEAD WITHOUT CONTRAST  TECHNIQUE: Multiplanar, multiecho pulse sequences of the brain and surrounding structures were obtained without intravenous contrast. Angiographic images of the head were obtained using MRA technique without contrast.  COMPARISON:  Prior head CT performed earlier on the same day as well as previous MRI from 04/25/2010.  FINDINGS: MRI HEAD FINDINGS  The CSF containing spaces are within normal limits for  patient age. Mild chronic microvascular ischemic changes involving the supratentorial white matter present, slightly progressed relative to 2012. No mass lesion, midline shift, or extra-axial fluid collection. Ventricles are normal in size without evidence of hydrocephalus.  No diffusion-weighted signal abnormality is identified to suggest acute intracranial infarct. Specifically, no infarcts seen involving the pons. Gray-white matter differentiation is maintained. Normal flow voids are seen within the intracranial vasculature. No intracranial hemorrhage identified.  The cervicomedullary junction is normal. Mild degenerative changes noted within the visualized upper cervical spine. Pituitary gland is within normal limits. Pituitary stalk is midline. The globes and optic nerves demonstrate a normal appearance with normal signal intensity.  The bone marrow signal intensity is normal. Calvarium is intact. Visualized upper cervical spine is within normal limits.  Scalp soft tissues are unremarkable.  Paranasal sinuses are clear.  No mastoid effusion.  MRA HEAD FINDINGS  ANTERIOR CIRCULATION:  The visualized portions of the distal cervical internal carotid arteries are widely patent with antegrade flow. Petrous and cavernous segments are widely  patent without hemodynamically significant stenosis. Supra clinoid segments unremarkable. A1 segments, anterior communicating artery, and anterior cerebral arteries are well within normal limits.  Minimal atherosclerotic irregularity noted within the M1 segments bilaterally without hemodynamically significant stenosis or proximal branch occlusion. MCA bifurcations and distal MCA branch branches are unremarkable.  No aneurysm within the anterior circulation.  POSTERIOR CIRCULATION:  The vertebral arteries are codominant with widely patent antegrade flow. Posterior inferior cerebral arteries within normal limits. Vertebrobasilar junction and basilar artery are well opacified without  high-grade stenosis or other acute abnormality. Superior cerebellar arteries within normal limits. There is a single short segment moderate stenosis within the proximal left posterior cerebral artery (series 506, image 12). No other significant stenosis noted within the posterior circulation. Prominent left posterior communicating artery present.  No aneurysm within the posterior circulation.  IMPRESSION: MRI HEAD IMPRESSION:  1. No acute intracranial infarct or other abnormality. 2. Mild chronic small vessel ischemic changes involving the supratentorial white matter, slightly progressed relative to 2012.  MRA HEAD IMPRESSION:  1. No proximal branch occlusion or hemodynamically significant stenosis identified within the intracranial circulation. 2. Single short segment moderate stenosis within the proximal left posterior cerebral artery.   Electronically Signed   By: Jeannine Boga M.D.   On: 12/16/2013 23:08   Mr Brain Ltd W/o Cm  12/17/2013   CLINICAL DATA:  Symptoms compatible with a brainstem infarct was crossed hemi paresis. Progressive symptoms.  EXAM: MRI HEAD WITHOUT CONTRAST  TECHNIQUE: Multiplanar, multiecho pulse sequences of the brain and surrounding structures were obtained without intravenous contrast.  COMPARISON:  MRI brain without contrast 12/16/2013.  FINDINGS: At the direction of the Neurology team, 3 mm coronal diffusion-weighted imaging was performed through the brainstem. This reveals no focal areas restricted diffusion to account for the acute brainstem stroke symptoms.  IMPRESSION: No evidence for acute or subacute brainstem infarct.   Electronically Signed   By: Lawrence Santiago M.D.   On: 12/17/2013 20:22   Mr Jodene Nam Head/brain Wo Cm  12/16/2013   CLINICAL DATA:  Left-sided facial droop, right-sided weakness. Evaluate for stroke (suspect ventral pontine infarct).  EXAM: MRI HEAD WITHOUT CONTRAST  MRA HEAD WITHOUT CONTRAST  TECHNIQUE: Multiplanar, multiecho pulse sequences of the brain  and surrounding structures were obtained without intravenous contrast. Angiographic images of the head were obtained using MRA technique without contrast.  COMPARISON:  Prior head CT performed earlier on the same day as well as previous MRI from 04/25/2010.  FINDINGS: MRI HEAD FINDINGS  The CSF containing spaces are within normal limits for patient age. Mild chronic microvascular ischemic changes involving the supratentorial white matter present, slightly progressed relative to 2012. No mass lesion, midline shift, or extra-axial fluid collection. Ventricles are normal in size without evidence of hydrocephalus.  No diffusion-weighted signal abnormality is identified to suggest acute intracranial infarct. Specifically, no infarcts seen involving the pons. Gray-white matter differentiation is maintained. Normal flow voids are seen within the intracranial vasculature. No intracranial hemorrhage identified.  The cervicomedullary junction is normal. Mild degenerative changes noted within the visualized upper cervical spine. Pituitary gland is within normal limits. Pituitary stalk is midline. The globes and optic nerves demonstrate a normal appearance with normal signal intensity.  The bone marrow signal intensity is normal. Calvarium is intact. Visualized upper cervical spine is within normal limits.  Scalp soft tissues are unremarkable.  Paranasal sinuses are clear.  No mastoid effusion.  MRA HEAD FINDINGS  ANTERIOR CIRCULATION:  The visualized portions of the distal cervical internal  carotid arteries are widely patent with antegrade flow. Petrous and cavernous segments are widely patent without hemodynamically significant stenosis. Supra clinoid segments unremarkable. A1 segments, anterior communicating artery, and anterior cerebral arteries are well within normal limits.  Minimal atherosclerotic irregularity noted within the M1 segments bilaterally without hemodynamically significant stenosis or proximal branch  occlusion. MCA bifurcations and distal MCA branch branches are unremarkable.  No aneurysm within the anterior circulation.  POSTERIOR CIRCULATION:  The vertebral arteries are codominant with widely patent antegrade flow. Posterior inferior cerebral arteries within normal limits. Vertebrobasilar junction and basilar artery are well opacified without high-grade stenosis or other acute abnormality. Superior cerebellar arteries within normal limits. There is a single short segment moderate stenosis within the proximal left posterior cerebral artery (series 506, image 12). No other significant stenosis noted within the posterior circulation. Prominent left posterior communicating artery present.  No aneurysm within the posterior circulation.  IMPRESSION: MRI HEAD IMPRESSION:  1. No acute intracranial infarct or other abnormality. 2. Mild chronic small vessel ischemic changes involving the supratentorial white matter, slightly progressed relative to 2012.  MRA HEAD IMPRESSION:  1. No proximal branch occlusion or hemodynamically significant stenosis identified within the intracranial circulation. 2. Single short segment moderate stenosis within the proximal left posterior cerebral artery.   Electronically Signed   By: Jeannine Boga M.D.   On: 12/16/2013 23:08    DISCHARGE EXAMINATION: Filed Vitals:   12/18/13 0154 12/18/13 0500 12/18/13 0603 12/18/13 1007  BP: 161/89 133/60 147/74 158/76  Pulse: 85 82 85 94  Temp: 97.7 F (36.5 C) 98 F (36.7 C) 98.5 F (36.9 C) 96.8 F (36 C)  TempSrc: Oral Oral Oral Oral  Resp: 18 18 20 20   Height:      Weight:      SpO2: 94% 95% 98% 98%   General appearance: alert, cooperative and no distress Resp: clear to auscultation bilaterally Cardio: regular rate and rhythm, S1, S2 normal, no murmur, click, rub or gallop GI: soft, non-tender; bowel sounds normal; no masses,  no organomegaly Neurologic: Left facial droop and right sided weakness.  DISPOSITION:  Home  Discharge Instructions   Ambulatory referral to Physical Therapy    Complete by:  As directed   Acute stroke. Right sided weakness.     Ambulatory referral to Speech Therapy    Complete by:  As directed   Acute stroke. Facial droop. Speech impairment.     Diet - low sodium heart healthy    Complete by:  As directed      Discharge instructions    Complete by:  As directed   No driving till cleared by neurology. Take study protocol medications as instructed.     Increase activity slowly    Complete by:  As directed      Reason for NOT prescribing anticoagulant therapy at discharge    Complete by:  As directed   Comment:  on research study protocol  Reason for not prescribing antIcoagulant at discharge?:  Other (Type in Comment field on line #2)           ALLERGIES:  Allergies  Allergen Reactions  . Penicillins Rash    Current Discharge Medication List    START taking these medications   Details  cephALEXin (KEFLEX) 500 MG capsule Take 1 capsule (500 mg total) by mouth 3 (three) times daily. For 5 days Qty: 15 capsule, Refills: 0    !! research study medication Take 100 mg by mouth daily. Take as instructed.    !!  research study medication Take 90 mg by mouth 2 (two) times daily. Take as instructed.     !! - Potential duplicate medications found. Please discuss with provider.    CONTINUE these medications which have NOT CHANGED   Details  albuterol (PROVENTIL HFA;VENTOLIN HFA) 108 (90 BASE) MCG/ACT inhaler Inhale 2 puffs into the lungs every 6 (six) hours as needed. Qty: 1 Inhaler, Refills: 6   Associated Diagnoses: Asthma    bisoprolol (ZEBETA) 5 MG tablet Take 1 tablet (5 mg total) by mouth daily. Qty: 30 tablet, Refills: 11    losartan (COZAAR) 100 MG tablet Take 100 mg by mouth daily.     spironolactone (ALDACTONE) 50 MG tablet Take 1 tablet (50 mg total) by mouth daily. Qty: 30 tablet, Refills: 11   Associated Diagnoses: Essential hypertension        Follow-up Information   Follow up with Redge Gainer, MD. Schedule an appointment as soon as possible for a visit in 1 week. (discuss diagnosis of diabetes)    Specialty:  Family Medicine   Contact information:   [place] [address] [phone]       Follow up with [facility] In 1 week. (to see research clinic staff)    Contact information:   [address] [place] [address] [phone]      Follow up with SETHI,PRAMOD, MD. Schedule an appointment as soon as possible for a visit in 3 weeks. (post hospitalization follow up)    Specialties:  Neurology, Radiology   Contact information:   [address] [place] [address] [phone]       TOTAL DISCHARGE TIME: 35 mins  [facility] Pager [phone]  12/18/2013, 12:09 PM  Disclaimer: This note was dictated with voice recognition software. Similar sounding words can inadvertently be transcribed and may not be corrected upon review.

## 2013-12-18 NOTE — Evaluation (Signed)
Supervised and reviewed by Lorea Kupfer MA CCC-SLP  

## 2013-12-18 NOTE — Progress Notes (Signed)
Physical Therapy Treatment Patient Details Name: [name] MRN: [id number] DOB: October 06, 1945 Today's Date: 12/18/2013    History of Present Illness [name] is a 68 y.o. female with history of hypertension presenting with  Left face and right sided weakness-crossed hemiparesis. She did not receive IV t-PA due to delay in arrival. Imaging reports no no acute infarct, however, patient with brainstem stroke not seen on MRI. Stroke work up underway. per neuro MD     PT Comments    Pt challenged with high level balance activities today and proved more steady today vs yesterday. Pt c/o "heaviness and incr fatigue" in Rt LE but no notable weakness noted throughout gt. Recommend OPPT for continued high level balance activities and for treatment of bells palsy like symptoms.   Follow Up Recommendations  Outpatient PT;Supervision/Assistance - 24 hour     Equipment Recommendations  Rolling walker with 5" wheels    Recommendations for Other Services       Precautions / Restrictions Precautions Precautions: None Restrictions Weight Bearing Restrictions: No    Mobility  Bed Mobility Overal bed mobility: Modified Independent             General bed mobility comments: HOB elevated; no phyical (A) needed  Transfers Overall transfer level: Needs assistance Equipment used: None Transfers: Sit to/from Stand Sit to Stand: Supervision         General transfer comment: supervision for safety only; no LOB noted  Ambulation/Gait Ambulation/Gait assistance: Supervision Ambulation Distance (Feet): 300 Feet Assistive device: None Gait Pattern/deviations: Step-through pattern;Drifts right/left Gait velocity: decreased Gait velocity interpretation: Below normal speed for age/gender General Gait Details: pt challenged with high level balance activities today; no LOB noted but pt was very guarded; pt able to ambulate without AD; more stabilized today; no weakness noted in Rt  LE with gt but pt did c/o Rt LE "heaviness" and incr fatigue vs Lt LE   Stairs Stairs: Yes Stairs assistance: Supervision Stair Management: Alternating pattern;Forwards;No rails Number of Stairs: 3 General stair comments: encouraged to ambulate without handrails; educated on strong leg up and weak LE down  Wheelchair Mobility    Modified Rankin (Stroke Patients Only) Modified Rankin (Stroke Patients Only) Pre-Morbid Rankin Score: No symptoms Modified Rankin: Moderate disability     Balance Overall balance assessment: No apparent balance deficits (not formally assessed)                           High level balance activites: Direction changes;Backward walking;Head turns;Sudden stops;Turns;Side stepping High Level Balance Comments: pt challenged with multiple high level activies today as seen above ^; pt also able to pick multiple objects up off ground without LOB     Cognition Arousal/Alertness: Awake/alert Behavior During Therapy: WFL for tasks assessed/performed Overall Cognitive Status: Within Functional Limits for tasks assessed                      Exercises      General Comments General comments (skin integrity, edema, etc.): discussed recommendation for continued OPPT for facial weakness; encouraged eye patch due to Lt eye pain for ~30 min at a time       Pertinent Vitals/Pain Pain Assessment: No/denies pain    Home Living                      Prior Function  PT Goals (current goals can now be found in the care plan section) Acute Rehab PT Goals Patient Stated Goal: to not look so scary PT Goal Formulation: With patient Time For Goal Achievement: 12/24/13 Potential to Achieve Goals: Good Progress towards PT goals: Progressing toward goals    Frequency  Min 4X/week    PT Plan Current plan remains appropriate    Co-evaluation             End of Session Equipment Utilized During Treatment: Gait  belt Activity Tolerance: Patient tolerated treatment well Patient left: in chair;with call bell/phone within reach;with chair alarm set     Time: 1046-1110 PT Time Calculation (min): 24 min  Charges:  [employer] Training: 23-37 mins                    G CodesGustavus Bryant, [place] [phone] 12/18/2013, 3:47 PM

## 2013-12-18 NOTE — Evaluation (Signed)
Speech Language Pathology Evaluation Patient Details Name: [name] MRN: [id number] DOB: 16-Sep-1945 Today's Date: 12/18/2013 Time: 5852-7782 SLP Time Calculation (min): 17 min  Problem List:  Patient Active Problem List   Diagnosis Date Noted  . UTI (urinary tract infection) 12/17/2013  . CVA (cerebral infarction) 12/16/2013  . Stroke-like symptoms 12/16/2013  . NICM (nonischemic cardiomyopathy), Echo 08/26/13-EF 25-30%  09/17/2013  . Chronic systolic heart failure 42/35/3614  . Acute bronchitis 03/03/2011  . Wheezing 03/03/2011  . DIAB W/O MENTION COMP TYPE II/UNS TYPE UNCNTRL 05/05/2010  . TRANSIENT ISCHEMIC ATTACK 05/05/2010  . HYPERTENSION 11/12/2007  . ASTHMA 11/12/2007  . GERD 11/12/2007  . Headache 11/12/2007  . NEPHROLITHIASIS, HX OF 11/12/2007   Past Medical History:  Past Medical History  Diagnosis Date  . Asthma   . Hypertension   . GERD (gastroesophageal reflux disease)   . History of nephrolithiasis   . Headache(784.0)   . Type II or unspecified type diabetes mellitus without mention of complication, uncontrolled     borderline  . NICM (nonischemic cardiomyopathy)     a. LHC (08/2013):  no CAD  . Chronic systolic heart failure     Echo (08/26/13):  Mild LVH. EF 25% to 30%. Diffuse HK. Aortic valve: There was trivial regurgitation. Mitral valve: There was mild regurgitation. Left atrium: The atrium was mildly dilated.   Past Surgical History:  Past Surgical History  Procedure Laterality Date  . Abdominal hysterectomy    . Oophorectomy      unilateral  . Ureteral extraction of kidney stone     HPI:  [name] is a 68 y.o. female with a history of hypertension who presents with onset on awakening of left-sided facial droop and right-sided weakness. MRI shows no acute infarct. Brainstem stroke not seen on MRI, presenting as crossed hemiparesis located in the lower medulla at level of decussation due to left LMN facial weakness and R leg paresis   Repeat MRI  shows no signs of acute infarct.   Assessment / Plan / Recommendation Clinical Impression  Pt presents with a mild dysarthria resulting in reduced intelligibility at the conversational level. She has a left sided facial droop which impacts her ability to clearly articulate. SLP instructed her to use large breaths before speaking and over articulation to improve intelligibility. With modeling, contextual and verbal cues she was able to effectively use these strategies. Recommend out patient therapy for continued speech services. These recommendations were discussed with the pt and she was provided a brochure with information about out patient options.     SLP Assessment  All further Speech Lanaguage Pathology  needs can be addressed in the next venue of care    Follow Up Recommendations  Outpatient SLP    Frequency and Duration        Pertinent Vitals/Pain Pain Assessment: No/denies pain   SLP Goals     SLP Evaluation Prior Functioning  Cognitive/Linguistic Baseline: Within functional limits Type of Home: House  Lives With: Daughter Available Help at Discharge: Family;Available PRN/intermittently   Cognition  Overall Cognitive Status: Within Functional Limits for tasks assessed Arousal/Alertness: Awake/alert Orientation Level: Oriented X4 Attention: Focused;Sustained Focused Attention: Appears intact Memory: Appears intact Awareness: Appears intact Problem Solving: Appears intact Safety/Judgment: Appears intact    Comprehension  Auditory Comprehension Overall Auditory Comprehension: Appears within functional limits for tasks assessed Yes/No Questions: Within Functional Limits Commands: Within Functional Limits Conversation: Complex Reading Comprehension Reading Status: Within funtional limits    Expression Expression Primary  Mode of Expression: Verbal Verbal Expression Overall Verbal Expression: Appears within functional limits for tasks  assessed Initiation: No impairment Automatic Speech: Name;Social Response;Counting Level of Generative/Spontaneous Verbalization: Conversation Repetition: No impairment Naming: No impairment Pragmatics: No impairment   Oral / Motor Oral Motor/Sensory Function Overall Oral Motor/Sensory Function: Impaired Labial ROM: Reduced left Labial Symmetry: Abnormal symmetry left Labial Strength: Reduced Facial Symmetry: Left droop Motor Speech Overall Motor Speech: Impaired Respiration: Within functional limits Articulation: Impaired Level of Impairment: Conversation Intelligibility: Intelligibility reduced Word: 75-100% accurate Phrase: 75-100% accurate Sentence: 75-100% accurate Conversation: 50-74% accurate   GO     Eden Emms 12/18/2013, 11:03 AM

## 2013-12-18 NOTE — Progress Notes (Signed)
VASCULAR LAB PRELIMINARY  PRELIMINARY  PRELIMINARY  PRELIMINARY  Carotid duplex  completed.    Preliminary report:  No significant ICA stenosis noted bilaterally.  Vertebral artery flow antegrade.  Rashunda Passon, RVT 12/18/2013, 9:05 AM

## 2013-12-19 LAB — URINE CULTURE: Colony Count: >=100000

## 2013-12-23 ENCOUNTER — Ambulatory Visit (INDEPENDENT_AMBULATORY_CARE_PROVIDER_SITE_OTHER): Payer: Self-pay

## 2013-12-23 ENCOUNTER — Telehealth: Payer: Self-pay | Admitting: Neurology

## 2013-12-23 DIAGNOSIS — I6350 Cerebral infarction due to unspecified occlusion or stenosis of unspecified cerebral artery: Secondary | ICD-10-CM

## 2013-12-23 DIAGNOSIS — I639 Cerebral infarction, unspecified: Secondary | ICD-10-CM

## 2013-12-23 NOTE — Telephone Encounter (Signed)
Angie, Physical Therapist at [facility] requesting orders tor Physical and Speech Therapy.  Please call and advise.

## 2013-12-23 NOTE — Progress Notes (Addendum)
Subject here  for her Visit 2 Day 7 follow-up visit in the [facility]CR[facility] study . All required questionnaires was answered and subject will continue in the study. Subject was advised to contact our office if they have any questions or concerns. Visit 3 follow-up phone call was scheduled at this visit.   Subject was seen by Dr. Leonie Man because of complaint of pain behind her ear. After further evaluation Dr. Leonie Man determined that she may have Bell's Palsy and was prescribed Acyclovir 800mg , TID for 10 days, because she did not want to take steroid.

## 2013-12-24 ENCOUNTER — Other Ambulatory Visit: Payer: Self-pay | Admitting: *Deleted

## 2013-12-24 ENCOUNTER — Encounter: Payer: Self-pay | Admitting: Family

## 2013-12-24 ENCOUNTER — Ambulatory Visit (INDEPENDENT_AMBULATORY_CARE_PROVIDER_SITE_OTHER): Payer: Medicare Other | Admitting: Family

## 2013-12-24 VITALS — BP 144/67 | HR 72 | Temp 97.8°F | Ht 64.0 in | Wt 181.2 lb

## 2013-12-24 DIAGNOSIS — E1165 Type 2 diabetes mellitus with hyperglycemia: Secondary | ICD-10-CM

## 2013-12-24 DIAGNOSIS — E119 Type 2 diabetes mellitus without complications: Secondary | ICD-10-CM

## 2013-12-24 DIAGNOSIS — I6350 Cerebral infarction due to unspecified occlusion or stenosis of unspecified cerebral artery: Secondary | ICD-10-CM

## 2013-12-24 DIAGNOSIS — I10 Essential (primary) hypertension: Secondary | ICD-10-CM

## 2013-12-24 DIAGNOSIS — Z09 Encounter for follow-up examination after completed treatment for conditions other than malignant neoplasm: Secondary | ICD-10-CM

## 2013-12-24 LAB — POCT GLYCOSYLATED HEMOGLOBIN (HGB A1C): Hemoglobin A1C: 5.8

## 2013-12-24 MED ORDER — METFORMIN HCL 500 MG PO TABS
500.0000 mg | ORAL_TABLET | Freq: Two times a day (BID) | ORAL | Status: DC
Start: 2013-12-24 — End: 2015-03-29

## 2013-12-24 MED ORDER — GLUCOSE BLOOD VI STRP: ORAL_STRIP | Status: AC

## 2013-12-24 MED ORDER — AMLODIPINE BESYLATE 10 MG PO TABS: 10.0000 mg | ORAL_TABLET | Freq: Every day | ORAL | Status: DC

## 2013-12-24 NOTE — Patient Instructions (Signed)

## 2013-12-24 NOTE — Telephone Encounter (Signed)
Sent both referrals over .On 12-24-2013

## 2013-12-24 NOTE — Progress Notes (Signed)
   Subjective:    Patient ID: [name], female    DOB: [birth year], 68 y.o.   MRN: [id number]  HPI Pt presents to the office for hospital follow from a CVA. Pt was admitted on 12/16/13. When the pt was discharged she was told she was a diabetic, but was not started on any medication at that time. She was told to follow-up with her PCP. Pt states she is having right leg weakness and right side facial drooping.    Pt has been enrolled in a study for CVA. Dr. Leonie Man has examined her and thinks that some of her facial drooping is r/t  Bell's Palsy and is currently being treated for that.    Review of Systems  Constitutional: Negative.   HENT: Negative.   Eyes: Negative.   Respiratory: Negative.  Negative for shortness of breath.   Cardiovascular: Negative.  Negative for palpitations.  Gastrointestinal: Negative.   Endocrine: Negative.   Genitourinary: Negative.   Musculoskeletal: Negative.   Neurological: Negative.  Negative for headaches.  Hematological: Negative.   Psychiatric/Behavioral: Negative.   All other systems reviewed and are negative.      Objective:   Physical Exam  Vitals reviewed. Constitutional: She is oriented to person, place, and time. She appears well-developed and well-nourished. No distress.  HENT:  Head: Normocephalic and atraumatic.  Right Ear: External ear normal.  Left Ear: External ear normal.  Nose: Nose normal.  Mouth/Throat: Oropharynx is clear and moist.  Eyes: Pupils are equal, round, and reactive to light.  Neck: Normal range of motion. Neck supple. No thyromegaly present.  Cardiovascular: Normal rate, regular rhythm, normal heart sounds and intact distal pulses.   No murmur heard. Pulmonary/Chest: Effort normal and breath sounds normal. No respiratory distress. She has no wheezes.  Abdominal: Soft. Bowel sounds are normal. She exhibits no distension. There is no tenderness.  Musculoskeletal: Normal range of motion. She exhibits no edema  and no tenderness.  Neurological: She is alert and oriented to person, place, and time. She has normal reflexes. No cranial nerve deficit.  Skin: Skin is warm and dry.  Psychiatric: She has a normal mood and affect. Her behavior is normal. Judgment and thought content normal.    BP 144/67  Pulse 72  Temp(Src) 97.8 F (36.6 C) (Oral)  Ht $R'5\' 4"'yk$  (1.626 m)  Wt 181 lb 3.2 oz (82.192 kg)  BMI 31.09 kg/m2       Assessment & Plan:  1. HYPERTENSION -Dash diet information given -Exercise encouraged - Stress Management  -Continue current meds -RTO in 2 weeks - amLODipine (NORVASC) 10 MG tablet; Take 1 tablet (10 mg total) by mouth daily.  Dispense: 90 tablet; Refill: 3  2. Type 2 diabetes mellitus with hyperglycemia -Low carb diet -Glucose monitor given to pt today-Pt educated -Pt needs appointment with Tammy asap for education - metFORMIN (GLUCOPHAGE) 500 MG tablet; Take 1 tablet (500 mg total) by mouth 2 (two) times daily with a meal.  Dispense: 180 tablet; Refill: 3 - glucose blood (ONETOUCH VERIO) test strip; Use as instructed  Dispense: 100 each; Refill: 12 - POCT glycosylated hemoglobin (Hb A1C) - CMP14+EGFR  3. Hospital discharge follow-up -Keep neurologist appointments -Keep cardiologists appointments  Evelina Dun, FNP

## 2013-12-25 ENCOUNTER — Telehealth: Payer: Self-pay | Admitting: Cardiology

## 2013-12-25 LAB — CMP14+EGFR
ALT: 17 IU/L (ref 0–32)
AST: 15 IU/L (ref 0–40)
Albumin/Globulin Ratio: 2.1 (ref 1.1–2.5)
Albumin: 4.5 g/dL (ref 3.6–4.8)
Alkaline Phosphatase: 56 IU/L (ref 39–117)
BUN/Creatinine Ratio: 18 (ref 11–26)
BUN: 20 mg/dL (ref 8–27)
CO2: 24 mmol/L (ref 18–29)
Calcium: 10.1 mg/dL (ref 8.7–10.3)
Chloride: 105 mmol/L (ref 97–108)
Creatinine, Ser: 1.12 mg/dL — ABNORMAL HIGH (ref 0.57–1.00)
GFR calc Af Amer: 58 mL/min/{1.73_m2} — ABNORMAL LOW (ref >59)
GFR calc non Af Amer: 50 mL/min/{1.73_m2} — ABNORMAL LOW (ref >59)
Globulin, Total: 2.1 g/dL (ref 1.5–4.5)
Glucose: 171 mg/dL — ABNORMAL HIGH (ref 65–99)
Potassium: 4.7 mmol/L (ref 3.5–5.2)
Sodium: 144 mmol/L (ref 134–144)
Total Bilirubin: 0.4 mg/dL (ref 0.0–1.2)
Total Protein: 6.6 g/dL (ref 6.0–8.5)

## 2013-12-25 NOTE — Telephone Encounter (Signed)
Pt was discharged from the hospital on 12-19-13. They gave he Amlodipine 10 mg.  And Metformin. Please call,she is concerns about the Metformin and wants to talk to somebody about it.

## 2013-12-25 NOTE — Telephone Encounter (Signed)
Concerned about side effects of metformin she read on the package insert with cardiac patient. Tried to assure patient and told her she needs to discuss this with her PCP, the prescribing doctor.  She feels this should have been discussed at the time it was prescribed.  Once again tried to reassure her and told her what she read are all potential side effects, but her DM treating physician will need to change this med.  Voiced understanding.

## 2013-12-26 ENCOUNTER — Ambulatory Visit: Payer: Medicare Other

## 2013-12-26 ENCOUNTER — Ambulatory Visit: Payer: Medicare Other | Attending: Neurology

## 2013-12-26 DIAGNOSIS — Z5189 Encounter for other specified aftercare: Secondary | ICD-10-CM | POA: Insufficient documentation

## 2013-12-26 DIAGNOSIS — R471 Dysarthria and anarthria: Secondary | ICD-10-CM | POA: Diagnosis not present

## 2013-12-26 DIAGNOSIS — R51 Headache: Secondary | ICD-10-CM | POA: Diagnosis not present

## 2013-12-26 DIAGNOSIS — M542 Cervicalgia: Secondary | ICD-10-CM | POA: Insufficient documentation

## 2014-01-05 ENCOUNTER — Ambulatory Visit: Payer: Medicare Other

## 2014-01-05 DIAGNOSIS — Z5189 Encounter for other specified aftercare: Secondary | ICD-10-CM | POA: Diagnosis not present

## 2014-01-07 ENCOUNTER — Ambulatory Visit (INDEPENDENT_AMBULATORY_CARE_PROVIDER_SITE_OTHER): Payer: Medicare Other | Admitting: Family

## 2014-01-07 ENCOUNTER — Encounter: Payer: Self-pay | Admitting: Family

## 2014-01-07 VITALS — BP 137/65 | HR 63 | Temp 97.3°F | Ht 64.0 in | Wt 177.4 lb

## 2014-01-07 DIAGNOSIS — I10 Essential (primary) hypertension: Secondary | ICD-10-CM

## 2014-01-07 DIAGNOSIS — E1165 Type 2 diabetes mellitus with hyperglycemia: Secondary | ICD-10-CM

## 2014-01-07 DIAGNOSIS — E119 Type 2 diabetes mellitus without complications: Secondary | ICD-10-CM

## 2014-01-07 NOTE — Patient Instructions (Addendum)
Type 2 Diabetes Mellitus Type 2 diabetes mellitus, often simply referred to as type 2 diabetes, is a long-lasting (chronic) disease. In type 2 diabetes, the pancreas does not make enough insulin (a hormone), the cells are less responsive to the insulin that is made (insulin resistance), or both. Normally, insulin moves sugars from food into the tissue cells. The tissue cells use the sugars for energy. The lack of insulin or the lack of normal response to insulin causes excess sugars to build up in the blood instead of going into the tissue cells. As a result, high blood sugar (hyperglycemia) develops. The effect of high sugar (glucose) levels can cause many complications. Type 2 diabetes was also previously called adult-onset diabetes, but it can occur at any age.  RISK FACTORS  A person is predisposed to developing type 2 diabetes if someone in the family has the disease and also has one or more of the following primary risk factors:  Overweight.  An inactive lifestyle.  A history of consistently eating high-calorie foods. Maintaining a normal weight and regular physical activity can reduce the chance of developing type 2 diabetes. SYMPTOMS  A person with type 2 diabetes may not show symptoms initially. The symptoms of type 2 diabetes appear slowly. The symptoms include:  Increased thirst (polydipsia).  Increased urination (polyuria).  Increased urination during the night (nocturia).  Weight loss. This weight loss may be rapid.  Frequent, recurring infections.  Tiredness (fatigue).  Weakness.  Vision changes, such as blurred vision.  Fruity smell to your breath.  Abdominal pain.  Nausea or vomiting.  Cuts or bruises which are slow to heal.  Tingling or numbness in the hands or feet. DIAGNOSIS Type 2 diabetes is frequently not diagnosed until complications of diabetes are present. Type 2 diabetes is diagnosed when symptoms or complications are present and when blood  glucose levels are increased. Your blood glucose level may be checked by one or more of the following blood tests:  A fasting blood glucose test. You will not be allowed to eat for at least 8 hours before a blood sample is taken.  A random blood glucose test. Your blood glucose is checked at any time of the day regardless of when you ate.  A hemoglobin A1c blood glucose test. A hemoglobin A1c test provides information about blood glucose control over the previous 3 months.  An oral glucose tolerance test (OGTT). Your blood glucose is measured after you have not eaten (fasted) for 2 hours and then after you drink a glucose-containing beverage. TREATMENT   You may need to take insulin or diabetes medicine daily to keep blood glucose levels in the desired range.  If you use insulin, you may need to adjust the dosage depending on the carbohydrates that you eat with each meal or snack. The treatment goal is to maintain the before meal blood sugar (preprandial glucose) level at 70-130 mg/dL. HOME CARE INSTRUCTIONS   Have your hemoglobin A1c level checked twice a year.  Perform daily blood glucose monitoring as directed by your health care provider.  Monitor urine ketones when you are ill and as directed by your health care provider.  Take your diabetes medicine or insulin as directed by your health care provider to maintain your blood glucose levels in the desired range.  Never run out of diabetes medicine or insulin. It is needed every day.  If you are using insulin, you may need to adjust the amount of insulin given based on your intake of   carbohydrates. Carbohydrates can raise blood glucose levels but need to be included in your diet. Carbohydrates provide vitamins, minerals, and fiber which are an essential part of a healthy diet. Carbohydrates are found in fruits, vegetables, whole grains, dairy products, legumes, and foods containing added sugars.  Eat healthy foods. You should make an  appointment to see a registered dietitian to help you create an eating plan that is right for you.  Lose weight if you are overweight.  Carry a medical alert card or wear your medical alert jewelry.  Carry a 15-gram carbohydrate snack with you at all times to treat low blood glucose (hypoglycemia). Some examples of 15-gram carbohydrate snacks include:  Glucose tablets, 3 or 4.  Glucose gel, 15-gram tube.  Raisins, 2 tablespoons (24 grams).  Jelly beans, 6.  Animal crackers, 8.  Regular pop, 4 ounces (120 mL).  Gummy treats, 9.  Recognize hypoglycemia. Hypoglycemia occurs with blood glucose levels of 70 mg/dL and below. The risk for hypoglycemia increases when fasting or skipping meals, during or after intense exercise, and during sleep. Hypoglycemia symptoms can include:  Tremors or shakes.  Decreased ability to concentrate.  Sweating.  Increased heart rate.  Headache.  Dry mouth.  Hunger.  Irritability.  Anxiety.  Restless sleep.  Altered speech or coordination.  Confusion.  Treat hypoglycemia promptly. If you are alert and able to safely swallow, follow the 15:15 rule:  Take 15-20 grams of rapid-acting glucose or carbohydrate. Rapid-acting options include glucose gel, glucose tablets, or 4 ounces (120 mL) of fruit juice, regular soda, or low-fat milk.  Check your blood glucose level 15 minutes after taking the glucose.  Take 15-20 grams more of glucose if the repeat blood glucose level is still 70 mg/dL or below.  Eat a meal or snack within 1 hour once blood glucose levels return to normal.  Be alert to feeling very thirsty and urinating more frequently than usual, which are early signs of hyperglycemia. An early awareness of hyperglycemia allows for prompt treatment. Treat hyperglycemia as directed by your health care provider.  Engage in at least 150 minutes of moderate-intensity physical activity a week, spread over at least 3 days of the week or as  directed by your health care provider. In addition, you should engage in resistance exercise at least 2 times a week or as directed by your health care provider. Try to spend no more than 90 minutes at one time inactive.  Adjust your medicine and food intake as needed if you start a new exercise or sport.  Follow your sick-day plan anytime you are unable to eat or drink as usual.  Do not use any tobacco products including cigarettes, chewing tobacco, or electronic cigarettes. If you need help quitting, ask your health care provider.  Limit alcohol intake to no more than 1 drink per day for nonpregnant women and 2 drinks per day for men. You should drink alcohol only when you are also eating food. Talk with your health care provider whether alcohol is safe for you. Tell your health care provider if you drink alcohol several times a week.  Keep all follow-up visits as directed by your health care provider. This is important.  Schedule an eye exam soon after the diagnosis of type 2 diabetes and then annually.  Perform daily skin and foot care. Examine your skin and feet daily for cuts, bruises, redness, nail problems, bleeding, blisters, or sores. A foot exam by a health care provider should be done annually.    Brush your teeth and gums at least twice a day and floss at least once a day. Follow up with your dentist regularly.  Share your diabetes management plan with your workplace or school.  Stay up-to-date with immunizations. It is recommended that people with diabetes who are over 68 years old get the pneumonia vaccine. In some cases, two separate shots may be given. Ask your health care provider if your pneumonia vaccination is up-to-date.  Learn to manage stress.  Obtain ongoing diabetes education and support as needed.  Participate in or seek rehabilitation as needed to maintain or improve independence and quality of life. Request a physical or occupational therapy referral if you are  having foot or hand numbness, or difficulties with grooming, dressing, eating, or physical activity. SEEK MEDICAL CARE IF:   You are unable to eat food or drink fluids for more than 6 hours.  You have nausea and vomiting for more than 6 hours.  Your blood glucose level is over 240 mg/dL.  There is a change in mental status.  You develop an additional serious illness.  You have diarrhea for more than 6 hours.  You have been sick or have had a fever for a couple of days and are not getting better.  You have pain during any physical activity.  SEEK IMMEDIATE MEDICAL CARE IF:  You have difficulty breathing.  You have moderate to large ketone levels. MAKE SURE YOU:  Understand these instructions.  Will watch your condition.  Will get help right away if you are not doing well or get worse. Document Released: 03/27/2005 Document Revised: 08/11/2013 Document Reviewed: 10/24/2011 [facility] Patient Information 2015 [place], [place]. This information is not intended to replace advice given to you by your health care provider. Make sure you discuss any questions you have with your health care provider.  Blood Glucose Monitoring Monitoring your blood glucose (also know as blood sugar) helps you to manage your diabetes. It also helps you and your health care provider monitor your diabetes and determine how well your treatment plan is working. WHY SHOULD YOU MONITOR YOUR BLOOD GLUCOSE?  It can help you understand how food, exercise, and medicine affect your blood glucose.  It allows you to know what your blood glucose is at any given moment. You can quickly tell if you are having low blood glucose (hypoglycemia) or high blood glucose (hyperglycemia).  It can help you and your health care provider know how to adjust your medicines.  It can help you understand how to manage an illness or adjust medicine for exercise. WHEN SHOULD YOU TEST? Your health care provider will help you decide how  often you should check your blood glucose. This may depend on the type of diabetes you have, your diabetes control, or the types of medicines you are taking. Be sure to write down all of your blood glucose readings so that this information can be reviewed with your health care provider. See below for examples of testing times that your health care provider may suggest. Type 1 Diabetes  Test 4 times a day if you are in good control, using an insulin pump, or perform multiple daily injections.  If your diabetes is not well controlled or if you are sick, you may need to monitor more often.  It is a good idea to also monitor:  Before and after exercise.  Between meals and 2 hours after a meal.  Occasionally between 2:00 a.m. and 3:00 a.m. Type 2 Diabetes  It can  vary with each person, but generally, if you are on insulin, test 4 times a day.  If you take medicines by mouth (orally), test 2 times a day.  If you are on a controlled diet, test once a day.  If your diabetes is not well controlled or if you are sick, you may need to monitor more often. HOW TO MONITOR YOUR BLOOD GLUCOSE Supplies Needed  Blood glucose meter.  Test strips for your meter. Each meter has its own strips. You must use the strips that go with your own meter.  A pricking needle (lancet).  A device that holds the lancet (lancing device).  A journal or log book to write down your results. Procedure  Wash your hands with soap and water. Alcohol is not preferred.  Prick the side of your finger (not the tip) with the lancet.  Gently milk the finger until a small drop of blood appears.  Follow the instructions that come with your meter for inserting the test strip, applying blood to the strip, and using your blood glucose meter. Other Areas to Get Blood for Testing Some meters allow you to use other areas of your body (other than your finger) to test your blood. These areas are called alternative sites. The most  common alternative sites are:  The forearm.  The thigh.  The back area of the lower leg.  The palm of the hand. The blood flow in these areas is slower. Therefore, the blood glucose values you get may be delayed, and the numbers are different from what you would get from your fingers. Do not use alternative sites if you think you are having hypoglycemia. Your reading will not be accurate. Always use a finger if you are having hypoglycemia. Also, if you cannot feel your lows (hypoglycemia unawareness), always use your fingers for your blood glucose checks. ADDITIONAL TIPS FOR GLUCOSE MONITORING  Do not reuse lancets.  Always carry your supplies with you.  All blood glucose meters have a 24-hour "hotline" number to call if you have questions or need help.  Adjust (calibrate) your blood glucose meter with a control solution after finishing a few boxes of strips. BLOOD GLUCOSE RECORD KEEPING It is a good idea to keep a daily record or log of your blood glucose readings. Most glucose meters, if not all, keep your glucose records stored in the meter. Some meters come with the ability to download your records to your home computer. Keeping a record of your blood glucose readings is especially helpful if you are wanting to look for patterns. Make notes to go along with the blood glucose readings because you might forget what happened at that exact time. Keeping good records helps you and your health care provider to work together to achieve good diabetes management.  Document Released: 03/30/2003 Document Revised: 08/11/2013 Document Reviewed: 08/19/2012 [facility] Patient Information 2015 [place], [place]. This information is not intended to replace advice given to you by your health care provider. Make sure you discuss any questions you have with your health care provider.

## 2014-01-07 NOTE — Progress Notes (Signed)
   Subjective:    Patient ID: [name], female    DOB: [birth year], 68 y.o.   MRN: [id number]  HPI Pt presents to office for follow-up for hypertension and a new diabetic. Pt's blood pressure is at goal today! Pt denies any any headache, SOB, palpitations, or edema.   Pt has brought in meter that was given to her last time. Pt states the meter was "defective". Pt was reeducated on using it and her blood glucose was 107. Pt has an appointment with Tammy on the 19th for a new diabetic.    Review of Systems  Constitutional: Negative.   HENT: Negative.   Eyes: Negative.   Respiratory: Negative.  Negative for shortness of breath.   Cardiovascular: Negative.  Negative for palpitations.  Gastrointestinal: Negative.   Endocrine: Negative.   Genitourinary: Negative.   Musculoskeletal: Negative.   Neurological: Negative.  Negative for headaches.  Hematological: Negative.   Psychiatric/Behavioral: Negative.   All other systems reviewed and are negative.      Objective:   Physical Exam  Vitals reviewed. Constitutional: She is oriented to person, place, and time. She appears well-developed and well-nourished. No distress.  HENT:  Head: Normocephalic and atraumatic.  Right Ear: External ear normal.  Left Ear: External ear normal.  Nose: Nose normal.  Mouth/Throat: Oropharynx is clear and moist.  Left sided drooping of face from a CVA on 12/16/13.   Eyes: Pupils are equal, round, and reactive to light.  Neck: Normal range of motion. Neck supple. No thyromegaly present.  Cardiovascular: Normal rate, regular rhythm, normal heart sounds and intact distal pulses.   No murmur heard. Pulmonary/Chest: Effort normal and breath sounds normal. No respiratory distress. She has no wheezes.  Abdominal: Soft. Bowel sounds are normal. She exhibits no distension. There is no tenderness.  Musculoskeletal: Normal range of motion. She exhibits no edema and no tenderness.  Neurological: She is alert  and oriented to person, place, and time. She has normal reflexes. No cranial nerve deficit.  Skin: Skin is warm and dry.  Psychiatric: She has a normal mood and affect. Her behavior is normal. Judgment and thought content normal.    BP 137/65  Pulse 63  Temp(Src) 97.3 F (36.3 C) (Oral)  Ht 5\' 4"  (1.626 m)  Wt 177 lb 6.4 oz (80.468 kg)  BMI 30.44 kg/m2       Assessment & Plan:  1. HYPERTENSION -Dash diet information given -Exercise encouraged - Stress Management  -Continue current meds -RTO in prn  2. Type 2 diabetes mellitus with hyperglycemia -Keep appointment with Tammy -Monitor blood sugars -Low carb diet   Continue all meds Health Maintenance reviewed-flu shot given today & pt to schedule mammogram today Diet and exercise encouraged RTO prn  Evelina Dun, FNP

## 2014-01-08 ENCOUNTER — Ambulatory Visit: Payer: Medicare Other | Attending: Neurology

## 2014-01-08 DIAGNOSIS — R471 Dysarthria and anarthria: Secondary | ICD-10-CM | POA: Insufficient documentation

## 2014-01-12 ENCOUNTER — Ambulatory Visit: Payer: Medicare Other

## 2014-01-16 ENCOUNTER — Encounter: Payer: Self-pay | Admitting: Nurse Practitioner

## 2014-01-16 ENCOUNTER — Ambulatory Visit: Payer: Medicare Other

## 2014-01-16 ENCOUNTER — Ambulatory Visit (INDEPENDENT_AMBULATORY_CARE_PROVIDER_SITE_OTHER): Payer: Medicare Other | Admitting: Nurse Practitioner

## 2014-01-16 VITALS — BP 138/58 | HR 57 | Temp 97.2°F | Ht 62.0 in | Wt 172.0 lb

## 2014-01-16 DIAGNOSIS — R471 Dysarthria and anarthria: Secondary | ICD-10-CM | POA: Diagnosis not present

## 2014-01-16 DIAGNOSIS — G510 Bell's palsy: Secondary | ICD-10-CM | POA: Insufficient documentation

## 2014-01-16 DIAGNOSIS — I639 Cerebral infarction, unspecified: Secondary | ICD-10-CM

## 2014-01-16 NOTE — Progress Notes (Signed)
I agree with above note 

## 2014-01-16 NOTE — Progress Notes (Signed)
PATIENT: [name] DOB: [birth year]  REASON FOR VISIT: hospital follow up for stroke, Bell's Palsy HISTORY FROM: patient  HISTORY OF PRESENT ILLNESS: [name] is a 68 y.o. female who comes to the office for first hospital follow up post hospital discharge for stroke. She has a history of hypertension who presents with onset on awakening of left-sided facial droop and right-sided weakness on 12/16/13. She states that she is normal on going to bed 12/15/2013 at 11 PM, and then when she awoke 12/16/2013 she noticed that her face didn't feel right. She currently complains of mild weakness on the right side. Patient was not administered TPA secondary to delay in arrival. She was admitted for further evaluation and treatment.  LDL 47, Hgb A1c 5.8.  Mri Brain showed no acute intracranial infarct or other abnormality. Mild chronic small vessel ischemic changes involving the supratentorial white matter, slightly progressed relative to 2012.  MRA Head showed short segment stenosis prox L PCA, otherwise unremarkable. Carotid doppler consistent with 1-39 percent stenosis. She was enrolled in the SOCRATES trial and discharged home with outpatient PT/OT and ST.  Patient was seen by Dr. Leonie Man on 12/23/13 during Socrates study visit because of complaint of pain behind her ear. After further evaluation Dr. Leonie Man determined that she may have Bell's Palsy and was prescribed Acyclovir 800mg , TID for 10 days, because she did not want to take steroid. Since then, there has been mild improvement in left facial droop. She has mild right lower facial weakness and right lower leg weakness as well. She has not returned to driving. She complains of a foul metallic taste in her mouth with decreased taste sensation.  REVIEW OF SYSTEMS: Full 14 system review of systems performed and notable only for: Appetite change, fatigue, unexpected weight change, here pain, trouble swallowing, drooling, and light sensitivity, eye  pain, joint pain, walking difficulty, facial drooping, decreased concentration, anxiety, trouble sleeping.   ALLERGIES: Allergies  Allergen Reactions  . Penicillins Rash    HOME MEDICATIONS: Outpatient Prescriptions Prior to Visit  Medication Sig Dispense Refill  . acyclovir (ZOVIRAX) 800 MG tablet Take 800 mg by mouth 3 (three) times daily.      Marland Kitchen albuterol (PROVENTIL HFA;VENTOLIN HFA) 108 (90 BASE) MCG/ACT inhaler Inhale 2 puffs into the lungs every 6 (six) hours as needed.  1 Inhaler  6  . amLODipine (NORVASC) 10 MG tablet Take 1 tablet (10 mg total) by mouth daily.  90 tablet  3  . bisoprolol (ZEBETA) 5 MG tablet Take 1 tablet (5 mg total) by mouth daily.  30 tablet  11  . glucose blood (ONETOUCH VERIO) test strip Use as instructed  100 each  12  . losartan (COZAAR) 100 MG tablet Take 100 mg by mouth daily.       . metFORMIN (GLUCOPHAGE) 500 MG tablet Take 1 tablet (500 mg total) by mouth 2 (two) times daily with a meal.  180 tablet  3  . research study medication Take 100 mg by mouth daily. Take as instructed.      . research study medication Take 90 mg by mouth 2 (two) times daily. Take as instructed.      Marland Kitchen spironolactone (ALDACTONE) 50 MG tablet Take 1 tablet (50 mg total) by mouth daily.  30 tablet  11  . traMADol (ULTRAM) 50 MG tablet Take 50 mg by mouth 3 (three) times daily as needed.      . furosemide (LASIX) 40 MG tablet  No facility-administered medications prior to visit.    PHYSICAL EXAM Filed Vitals:   01/16/14 1050  BP: 138/58  Pulse: 57  Temp: 97.2 F (36.2 C)  TempSrc: Oral  Height: 5\' 2"  (1.575 m)  Weight: 172 lb (78.019 kg)   Body mass index is 31.45 kg/(m^2).  Visual Acuity Screening   Right eye Left eye Both eyes  Without correction: 20/70 20/200   With correction:      Generalized: Well developed, in no acute distress  Head: normocephalic and atraumatic. Oropharynx benign  Neck: Supple, no carotid bruits  Cardiac: Regular rate rhythm, no  murmur  Musculoskeletal: No deformity   Neurological examination  Mentation: Alert oriented to time, place, history taking. Follows all commands speech and language mildly dysarthric. Cranial nerve II-XII: Fundoscopic exam not done. Pupils were equal round reactive to light extraocular movements were full, visual field were full on confrontational test. Mild left Upper face and moderate left lower face weakness. Hearing was intact to finger rubbing bilaterally. Uvula tongue midline. Head turning and shoulder shrug and were normal and symmetric.Tongue protrusion into cheek strength was normal. Motor: Mild diminished fine finger movements on left. Orbits right over left upper extremity. Mild right leg weakness at hip. Good symmetric motor tone is noted throughout.  Sensory: Sensory testing is intact to soft touch on all 4 extremities. No evidence of extinction is noted.  Coordination: Cerebellar testing reveals good finger-nose-finger and heel-to-shin bilaterally.  Gait and station: Gait is normal. Tandem gait is unsteady. Romberg is negative. Reflexes: Deep tendon reflexes are symmetric and normal bilaterally.  NIHSS: 2 MRs: 1  ASSESSMENT: [name] is a 68 y.o. female with history of hypertension presenting with Left face and right sided weakness-crossed hemiparesis. She did not receive IV t-PA due to delay in arrival. Imaging reports no no acute infarct, however, patient with brainstem stroke not seen on MRI.  Resultant mild right lower facial weakness and RLE hemiparesis. Left facial weakness due to Bell's Palsy, with associated pain around left ear and metallic taste.  PLAN: I had a long discussion with the patient and friend regarding her recent stroke and Bell's Palsy, discussed results of evaluation in the hospital and answered questions. Continue Socrates Study Medication for secondary stroke prevention and maintain strict control of hypertension with blood pressure goal below  130/90, diabetes with hemoglobin A1c goal below 6.5% and lipids with LDL cholesterol goal below 70 mg/dL. Continue outpatient therapies. Have advised her to not drive until Physical therapy is complete and Bell's Palsy resolves. Followup in the future with Dr. Leonie Man in 3 months, sooner as needed.  Rudi Rummage LAM, MSN, FNP-BC, A/GNP-C 01/16/2014, 11:05 AM [facility] [address] [place], [place] [address] [phone]  Note: This document was prepared with digital dictation and possible smart phrase technology. Any transcriptional errors that result from this process are unintentional.

## 2014-01-16 NOTE — Patient Instructions (Addendum)
Continue Socrates Study Medication for secondary stroke prevention and maintain strict control of hypertension with blood pressure goal below 130/90, diabetes with hemoglobin A1c goal below 6.5% and lipids with LDL cholesterol goal below 70 mg/dL. Followup in the future with Dr. Leonie Man in 2-3 months, sooner as needed.  Stroke Prevention Some medical conditions and behaviors are associated with an increased chance of having a stroke. You may prevent a stroke by making healthy choices and managing medical conditions. HOW CAN I REDUCE MY RISK OF HAVING A STROKE?   Stay physically active. Get at least 30 minutes of activity on most or all days.  Do not smoke. It may also be helpful to avoid exposure to secondhand smoke.  Limit alcohol use. Moderate alcohol use is considered to be:  No more than 2 drinks per day for men.  No more than 1 drink per day for nonpregnant women.  Eat healthy foods. This involves:  Eating 5 or more servings of fruits and vegetables a day.  Making dietary changes that address high blood pressure (hypertension), high cholesterol, diabetes, or obesity.  Manage your cholesterol levels.  Making food choices that are high in fiber and low in saturated fat, trans fat, and cholesterol may control cholesterol levels.  Take any prescribed medicines to control cholesterol as directed by your health care provider.  Manage your diabetes.  Controlling your carbohydrate and sugar intake is recommended to manage diabetes.  Take any prescribed medicines to control diabetes as directed by your health care provider.  Control your hypertension.  Making food choices that are low in salt (sodium), saturated fat, trans fat, and cholesterol is recommended to manage hypertension.  Take any prescribed medicines to control hypertension as directed by your health care provider.  Maintain a healthy weight.  Reducing calorie intake and making food choices that are low in sodium,  saturated fat, trans fat, and cholesterol are recommended to manage weight.  Stop drug abuse.  Avoid taking birth control pills.  Talk to your health care provider about the risks of taking birth control pills if you are over 66 years old, smoke, get migraines, or have ever had a blood clot.  Get evaluated for sleep disorders (sleep apnea).  Talk to your health care provider about getting a sleep evaluation if you snore a lot or have excessive sleepiness.  Take medicines only as directed by your health care provider.  For some people, aspirin or blood thinners (anticoagulants) are helpful in reducing the risk of forming abnormal blood clots that can lead to stroke. If you have the irregular heart rhythm of atrial fibrillation, you should be on a blood thinner unless there is a good reason you cannot take them.  Understand all your medicine instructions.  Make sure that other conditions (such as anemia or atherosclerosis) are addressed. SEEK IMMEDIATE MEDICAL CARE IF:   You have sudden weakness or numbness of the face, arm, or leg, especially on one side of the body.  Your face or eyelid droops to one side.  You have sudden confusion.  You have trouble speaking (aphasia) or understanding.  You have sudden trouble seeing in one or both eyes.  You have sudden trouble walking.  You have dizziness.  You have a loss of balance or coordination.  You have a sudden, severe headache with no known cause.  You have new chest pain or an irregular heartbeat. Any of these symptoms may represent a serious problem that is an emergency. Do not wait to see  if the symptoms will go away. Get medical help at once. Call your local emergency services (911 in [place] Do not drive yourself to the hospital. Document Released: 05/04/2004 Document Revised: 08/11/2013 Document Reviewed: 09/27/2012 [facility] Patient Information 2015 [place], [place]. This information is not intended to replace advice given  to you by your health care provider. Make sure you discuss any questions you have with your health care provider.

## 2014-01-19 ENCOUNTER — Encounter: Payer: Self-pay | Admitting: Pharmacist

## 2014-01-19 ENCOUNTER — Ambulatory Visit (INDEPENDENT_AMBULATORY_CARE_PROVIDER_SITE_OTHER): Payer: Medicare Other | Admitting: Pharmacist

## 2014-01-19 VITALS — BP 134/68 | HR 67 | Ht 62.0 in | Wt 177.0 lb

## 2014-01-19 DIAGNOSIS — IMO0002 Reserved for concepts with insufficient information to code with codable children: Secondary | ICD-10-CM

## 2014-01-19 DIAGNOSIS — E1165 Type 2 diabetes mellitus with hyperglycemia: Secondary | ICD-10-CM

## 2014-01-19 MED ORDER — ONETOUCH DELICA LANCETS FINE MISC: Status: AC

## 2014-01-19 NOTE — Patient Instructions (Signed)
Diabetes and Standards of Medical Care  Diabetes is complicated. You may find that your diabetes team includes a dietitian, nurse, diabetes educator, eye doctor, and more. To help everyone know what is going on and to help you get the care you deserve, the following schedule of care was developed to help keep you on track. Below are the tests, exams, vaccines, medicines, education, and plans you will need.  Blood Glucose Goals Prior to meals = 80 - 130 Within 2 hours of the start of a meal = less than 180  HbA1c test (goal is less than 7.0%) This test shows how well you have controlled your glucose over the past 2 3 months. It is used to see if your diabetes management plan needs to be adjusted.   It is performed at least 2 times a year if you are meeting treatment goals.  It is performed 4 times a year if therapy has changed or if you are not meeting treatment goals.   Blood pressure test  This test is performed at every routine medical visit. The goal is less than 140/90 mmHg for most people, but 130/80 mmHg in some cases. Ask your health care provider about your goal. Dental exam  Follow up with the dentist regularly. Eye exam  If you are diagnosed with type 1 diabetes as a child, get an exam upon reaching the age of 100 years or older and have had diabetes for 3 5 years. Yearly eye exams are recommended after that initial eye exam.  If you are diagnosed with type 1 diabetes as an adult, get an exam within 5 years of diagnosis and then yearly.  If you are diagnosed with type 2 diabetes, get an exam as soon as possible after the diagnosis and then yearly. Foot care exam  Visual foot exams are performed at every routine medical visit. The exams check for cuts, injuries, or other problems with the feet.  A comprehensive foot exam should be done yearly. This includes visual inspection as well as assessing foot pulses and testing for loss of sensation.  Check your feet nightly for  cuts, injuries, or other problems with your feet. Tell your health care provider if anything is not healing. Kidney function test (urine microalbumin)  This test is performed once a year.  Type 1 diabetes: The first test is performed 5 years after diagnosis.  Type 2 diabetes: The first test is performed at the time of diagnosis.  A serum creatinine and estimated glomerular filtration rate (eGFR) test is done once a year to assess the level of chronic kidney disease (CKD), if present. Lipid profile (cholesterol, HDL, LDL, triglycerides)  Performed every 5 years for most people.  The goal for LDL is less than 100 mg/dL. If you are at high risk, the goal is less than 70 mg/dL.  The goal for HDL is 40 mg/dL 50 mg/dL for men and 50 mg/dL 60 mg/dL for women. An HDL cholesterol of 60 mg/dL or higher gives some protection against heart disease.  The goal for triglycerides is less than 150 mg/dL. Influenza vaccine, pneumococcal vaccine, and hepatitis B vaccine  The influenza vaccine is recommended yearly.  The pneumococcal vaccine is generally given once in a lifetime. However, there are some instances when another vaccination is recommended. Check with your health care provider.  The hepatitis B vaccine is also recommended for adults with diabetes. Diabetes self-management education  Education is recommended at diagnosis and ongoing as needed. Treatment plan  Your  treatment plan is reviewed at every medical visit. Document Released: 01/22/2009 Document Revised: 11/27/2012 Document Reviewed: 08/27/2012 [facility] Patient Information 2014 [place].

## 2014-01-19 NOTE — Progress Notes (Signed)
Subjective:    [name] is a 68 y.o. female who presents for an initial evaluation of Type 2 diabetes mellitus and diabetes education.  Patient was diagnosed several years ago.  Had been diet controlled until currently.  Patient's labs records show two recent A1c values - 5.8% (12/24/2013) and 7.9% (12/17/2013).  Current symptoms/problems include hyperglycemia and have been worsening. Symptoms have been present for 1 month.  Known diabetic complications: peripheral neuropathy, cardiovascular disease and cerebrovascular disease Cardiovascular risk factors: advanced age (older than 53 for men, 87 for women), diabetes mellitus, dyslipidemia, hypertension, obesity (BMI >= 30 kg/m2) and sedentary lifestyle Current diabetic medications include oral agent (monotherapy): metformin (generic) 500mg  1 tablet BID   Eye exam current (within one year): unknown Weight trend: stable Prior visit with dietician: no Current diet: in general, an "unhealthy" diet Current exercise: none  Current monitoring regimen: none Home blood sugar records: none Any episodes of hypoglycemia? no  Is She on ACE inhibitor or angiotensin II receptor blocker?  No    losartan (Cozaar)  The following portions of the patient's history were reviewed and updated as appropriate: allergies, current medications, past family history, past medical history, past social history, past surgical history and problem list.  Objective:    BP 134/68  Pulse 67  Ht 5\' 2"  (1.575 m)  Wt 177 lb (80.287 kg)  BMI 32.37 kg/m2   Lab Review Glucose (mg/dL)  Date Value  12/24/2013 171*  12/03/2013 181*     Glucose, Bld (mg/dL)  Date Value  12/18/2013 150*  12/16/2013 212*  12/16/2013 203*     CO2 (mmol/L)  Date Value  12/24/2013 24   12/18/2013 23   12/16/2013 22      BUN (mg/dL)  Date Value  12/24/2013 20   12/18/2013 17   12/16/2013 22   12/16/2013 22   12/03/2013 21      Creat (mg/dL)  Date Value  11/18/2013 0.86       Creatinine, Ser (mg/dL)  Date Value  12/24/2013 1.12*  12/18/2013 0.78   12/16/2013 0.78     Assessment:    Diabetes Mellitus type II, under inadequate control.    Plan:    1.  Rx changes: continue metformin 500mg  1 tablet BID 2.  Education: Reviewed 'ABCs' of diabetes management (respective goals in parentheses):  A1C (<7), blood pressure (<130/80), and cholesterol (LDL <100). 3.  Dietary modifications:  Discussed carb counting diet, serving sizes and daily CHO intake recommendations. 4.  Patient currently doing PT after stroke - Discussed Increasing exercise as able when cleared.  5.  Spent 10 minutes teaching how to use glucometer, BG goals and recommended regular blood sugar monitoring: once daily. 6.   Follow up: 4 weeks   Cherre Robins, PharmD, CPP, CDE

## 2014-01-20 ENCOUNTER — Ambulatory Visit: Payer: Medicare Other

## 2014-01-20 DIAGNOSIS — R471 Dysarthria and anarthria: Secondary | ICD-10-CM | POA: Diagnosis not present

## 2014-01-21 ENCOUNTER — Ambulatory Visit: Payer: Medicare Other

## 2014-01-23 ENCOUNTER — Ambulatory Visit: Payer: Medicare Other

## 2014-01-23 DIAGNOSIS — R471 Dysarthria and anarthria: Secondary | ICD-10-CM | POA: Diagnosis not present

## 2014-01-26 ENCOUNTER — Ambulatory Visit: Payer: Medicare Other

## 2014-01-26 DIAGNOSIS — R471 Dysarthria and anarthria: Secondary | ICD-10-CM | POA: Diagnosis not present

## 2014-01-27 ENCOUNTER — Encounter (HOSPITAL_BASED_OUTPATIENT_CLINIC_OR_DEPARTMENT_OTHER): Payer: Medicare Other

## 2014-01-29 ENCOUNTER — Ambulatory Visit: Payer: Medicare Other

## 2014-02-03 ENCOUNTER — Ambulatory Visit: Payer: Medicare Other

## 2014-02-03 DIAGNOSIS — R471 Dysarthria and anarthria: Secondary | ICD-10-CM | POA: Diagnosis not present

## 2014-02-05 ENCOUNTER — Ambulatory Visit: Payer: Medicare Other

## 2014-02-17 ENCOUNTER — Encounter: Payer: Self-pay | Admitting: Neurology

## 2014-02-18 ENCOUNTER — Ambulatory Visit: Payer: Medicare Other | Attending: Neurology

## 2014-02-18 DIAGNOSIS — R471 Dysarthria and anarthria: Secondary | ICD-10-CM | POA: Diagnosis present

## 2014-02-18 DIAGNOSIS — R6889 Other general symptoms and signs: Secondary | ICD-10-CM

## 2014-02-18 DIAGNOSIS — IMO0002 Reserved for concepts with insufficient information to code with codable children: Secondary | ICD-10-CM

## 2014-02-18 NOTE — Therapy (Signed)
Physical Therapy Treatment  Patient Details  Name: [name] MRN: [id number] Date of Birth: [birth year]  Encounter Date: 02/18/2014      PT End of Session - 02/18/14 1038    Visit Number 10   Number of Visits 16   Date for PT Re-Evaluation 02/20/14   Authorization Type Medicare-G codes will be needed every 10 visits   PT Start Time 0938   PT Stop Time 1022   PT Time Calculation (min) 44 min      Past Medical History  Diagnosis Date  . Asthma   . Hypertension   . GERD (gastroesophageal reflux disease)   . History of nephrolithiasis   . Headache(784.0)   . Type II or unspecified type diabetes mellitus without mention of complication, uncontrolled     borderline  . NICM (nonischemic cardiomyopathy)     a. LHC (08/2013):  no CAD  . Chronic systolic heart failure     Echo (08/26/13):  Mild LVH. EF 25% to 30%. Diffuse HK. Aortic valve: There was trivial regurgitation. Mitral valve: There was mild regurgitation. Left atrium: The atrium was mildly dilated.  . Stroke     Past Surgical History  Procedure Laterality Date  . Abdominal hysterectomy    . Oophorectomy      unilateral  . Ureteral extraction of kidney stone      There were no vitals taken for this visit.  Visit Diagnosis:  Lack of coordination due to stroke  Activity intolerance      Subjective Assessment - 02/18/14 0944    Symptoms I had the flu over the weekend, but no temperature or aches today.   Currently in Pain? Yes   Pain Score 4    Pain Location Eye   Pain Orientation Left            [facility] Adult PT Treatment/Exercise - 02/18/14 0001    Ambulation/Gait   Ambulation/Gait Yes   Ambulation Distance (Feet) 1252 Feet  6 minute walk test distance (no assistive device, no rest)   Standardized Balance Assessment   Standardized Balance Assessment Dynamic Gait Index   Dynamic Gait Index   Level Surface Normal   Change in Gait Speed Normal   Gait with Horizontal Head Turns Mild Impairment   Gait with Vertical Head Turns Mild Impairment   Gait and Pivot Turn Normal   Step Over Obstacle Normal   Step Around Obstacles Normal   Steps Mild Impairment   Total Score 21   Exercises   Exercises Knee/Hip;Lumbar   Lumbar Exercises: Supine   Dead Bug --  3x10   Isometric Hip Flexion 5 reps  10-15 seconds   Other Supine Lumbar Exercises Simultaneous bilateral straight leg raise with hip external rotation 3x6, 3x10 scissors          PT Education - 02/18/14 1037    Education provided Yes   Education Details Pt asked about footwear. I informed pt of [employer] method of fitting shoes, and recommended this location to pt. We discussed her footwear needs. Also taught pt isometric hip flexion (she was previously given the handout and told to hold off on it until we performed in clinic),  see scanned documents for handout   Person(s) Educated Patient   Methods Explanation;Handout;Verbal cues   Comprehension Verbalized understanding            PT Long Term Goals - 02/18/14 1043    PT LONG TERM GOAL #1   Title To be met  by 02/20/2014: Verbalize understanding of fall prevention strategies within the home environment   Status On-going   PT LONG TERM GOAL #2   Title To be met by 02/20/2014: Increase Score on DGI to 22/24 for decreased fall risk   Baseline Progressing. On 03/19/14 pt scored 21/24   Status On-going   PT LONG TERM GOAL #3   Title To be met by 02/20/2014: Increase FOTO functional status score to 60 for improved quality of life.   Status On-going   PT LONG TERM GOAL #4   Title To be met by 02/20/2014: Ambulate independently on outdoor, unlevel surfaces including ramps and curbs for improved community access   Status On-going   PT LONG TERM GOAL #5   Title Increase Six minute walk test distance to 1100   Status Achieved   Additional Long Term Goals   Additional Long Term Goals Yes   PT LONG TERM GOAL #6   Title Demonstrate ability to step over a 6"  obstacle while ambulating without loss of balance and without slowing down to clear the obstacle (as in DGI)   Status Achieved          Plan - 19-Mar-2014 1039    Clinical Impression Statement Pt is making significant progress toward goals. She met 2 long term goals today, and will check remaining goals and renew next visit. Left upper and lower eyelid function are improving as well.   PT Next Visit Plan Finish checking long term goals and renew/recert.           G-Codes - 19-Mar-2014 1049    Functional Assessment Tool Used DGI   Functional Limitation Mobility: Walking and moving around   Mobility: Walking and Moving Around Current Status [phone]) At least 1 percent but less than 20 percent impaired, limited or restricted   Mobility: Walking and Moving Around Goal Status [phone]) At least 1 percent but less than 20 percent impaired, limited or restricted      Problem List Patient Active Problem List   Diagnosis Date Noted  . Left-sided Bell's palsy 01/16/2014  . CVA (cerebral infarction) 12/16/2013  . NICM (nonischemic cardiomyopathy), Echo 08/26/13-EF 25-30%  09/17/2013  . Chronic systolic heart failure 14/23/9532  . Acute bronchitis 03/03/2011  . Wheezing 03/03/2011  . Uncontrolled type 2 diabetes mellitus 05/05/2010  . TRANSIENT ISCHEMIC ATTACK 05/05/2010  . HYPERTENSION 11/12/2007  . ASTHMA 11/12/2007  . GERD 11/12/2007  . Headache 11/12/2007  . NEPHROLITHIASIS, HX OF 11/12/2007                                              Delrae Sawyers D Mar 19, 2014, 10:51 AM

## 2014-02-19 ENCOUNTER — Telehealth: Payer: Self-pay | Admitting: Neurology

## 2014-02-19 NOTE — Telephone Encounter (Signed)
Left pt. A message to return my call regarding the study trial.

## 2014-02-20 ENCOUNTER — Telehealth: Payer: Self-pay | Admitting: Neurology

## 2014-02-20 ENCOUNTER — Ambulatory Visit: Payer: Medicare Other

## 2014-02-20 NOTE — Telephone Encounter (Signed)
I spoke to the patient to schedule her Visit 5 for the Socrates Trial on Tuesday, December 8th, 2015 at 15:30h.

## 2014-02-23 ENCOUNTER — Ambulatory Visit: Payer: Medicare Other

## 2014-02-25 ENCOUNTER — Ambulatory Visit: Payer: Medicare Other

## 2014-02-25 DIAGNOSIS — IMO0002 Reserved for concepts with insufficient information to code with codable children: Secondary | ICD-10-CM

## 2014-02-25 DIAGNOSIS — R6889 Other general symptoms and signs: Secondary | ICD-10-CM

## 2014-02-25 DIAGNOSIS — R471 Dysarthria and anarthria: Secondary | ICD-10-CM | POA: Diagnosis not present

## 2014-02-25 NOTE — Patient Instructions (Signed)
See new notes on old exercise handout and perform as indicated.  Also: 1. Begin walking at mall or [employer], against the flow of people walking to further stimulate your vision 2. New eye exercise:  Sit in a chair turn your head and eyes as far as you can and fix your vision on an object posteriorly toward the left of you. Let your vision focus. Then quickly turn head to  the right and focus on something posteriorly to the right of you. Keep performing this quickly until your eyes feel tired. To progress this exercise, look at an object that is  even farther back. 3. If you are limited on time, focus most on eye exercises, next preference would be balance, then core.  It is important to avoid accidents which may result in broken bones.  Here are a few ideas on how to make your home safer so you will be less likely to trip or fall.  1. Use nonskid mats or non slip strips in your shower or tub, on your bathroom floor and around sinks.  If you know that you have spilled water, wipe it up! 2. In the bathroom, it is important to have properly installed grab bars on the walls or on the edge of the tub.  Towel racks are NOT strong enough for you to hold onto or to pull on for support. 3. Stairs and hallways should have enough light.  Add lamps or night lights if you need ore light. 4. It is good to have handrails on both sides of the stairs if possible.  Always fix broken handrails right away. 5. It is important to see the edges of steps.  Paint the edges of outdoor steps white so you can see them better.  Put colored tape on the edge of inside steps. 6. Throw-rugs are dangerous because they can slide.  Removing the rugs is the best idea, but if they must stay, add adhesive carpet tape to prevent slipping. 7. Do not keep things on stairs or in the halls.  Remove small furniture that blocks the halls as it may cause you to trip.  Keep telephone and electrical cords out of the way where you walk. 8. Always  were sturdy, rubber-soled shoes for good support.  Never wear just socks, especially on the stairs.  Socks may cause you to slip or fall.  Do not wear full-length housecoats as you can easily trip on the bottom.  9. Place the things you use the most on the shelves that are the easiest to reach.  If you use a stepstool, make sure it is in good condition.  If you feel unsteady, DO NOT climb, ask for help. 10. If a health professional advises you to use a cane or walker, do not be ashamed.  These items can keep you from falling and breaking your bones.

## 2014-02-25 NOTE — Therapy (Signed)
Physical Therapy Treatment  Patient Details  Name: [name] MRN: [id number] Date of Birth: 1945/12/31  Encounter Date: 02/25/2014      PT End of Session - 02/25/14 1129    Visit Number 11   PT Start Time 5597   PT Stop Time 4163   PT Time Calculation (min) 71 min      Past Medical History  Diagnosis Date  . Asthma   . Hypertension   . GERD (gastroesophageal reflux disease)   . History of nephrolithiasis   . Headache(784.0)   . Type II or unspecified type diabetes mellitus without mention of complication, uncontrolled     borderline  . NICM (nonischemic cardiomyopathy)     a. LHC (08/2013):  no CAD  . Chronic systolic heart failure     Echo (08/26/13):  Mild LVH. EF 25% to 30%. Diffuse HK. Aortic valve: There was trivial regurgitation. Mitral valve: There was mild regurgitation. Left atrium: The atrium was mildly dilated.  . Stroke     Past Surgical History  Procedure Laterality Date  . Abdominal hysterectomy    . Oophorectomy      unilateral  . Ureteral extraction of kidney stone      There were no vitals taken for this visit.  Visit Diagnosis:  Lack of coordination due to stroke - Plan: PT plan of care cert/re-cert  Activity intolerance - Plan: PT plan of care cert/re-cert        [facility] PT Assessment - 02/25/14 0001    Observation/Other Assessments   Focus on Therapeutic Outcomes (FOTO)  Functional Status 54          [facility] Adult PT Treatment/Exercise - 02/25/14 0001    Standardized Balance Assessment   Standardized Balance Assessment Dynamic Gait Index   Dynamic Gait Index   Level Surface Normal   Change in Gait Speed Normal   Gait with Horizontal Head Turns Mild Impairment   Gait with Vertical Head Turns Mild Impairment   Gait and Pivot Turn Normal   Step Over Obstacle Normal   Step Around Obstacles Normal   Steps Normal   Total Score 22          PT Education - 02/25/14 1128    Education provided Yes   Education Details See AVS   Person(s) Educated Patient   Methods Explanation;Demonstration   Comprehension Verbalized understanding;Returned demonstration     Neuro Re-ed: Reviewed all previously provided home exercise program handouts and pt performed exercises and therapist updated all exercises as appropriate, also discharged a couple exercises. See scanned media of these HEPs for details. Pt demonstrated correct performance of the updated exercises as well. Also see patient education section for additional home exercises provided today.  Self care: fall prevention education; and pt instructed that if she feels that she doesn't continue to make progress with HEP as expected, she is free to request a new MD order for PT if needed.  Gait assessment: Ambulated >1000' on pine straw, curbs and ramps 3x, grass and uneven concrete outdoors independently. Pt continues to have occasional R foot slap.       PT Long Term Goals - 02/25/14 1021    PT LONG TERM GOAL #1   Title To be met by 02/20/2014: Verbalize understanding of fall prevention strategies within the home environment   Status Achieved   PT LONG TERM GOAL #2   Title To be met by 02/20/2014: Increase Score on DGI to 22/24 for decreased fall risk  Baseline Scored 22/24 today 13-Mar-2014   Status Achieved   PT LONG TERM GOAL #3   Title To be met by 02/20/2014: Increase FOTO functional status score to 60 for improved quality of life.   Baseline Scored 54 on FOTO functional status today 03/13/2014   Status Not Met   PT LONG TERM GOAL #4   Title To be met by 02/20/2014: Ambulate independently on outdoor, unlevel surfaces including ramps and curbs for improved community access   Status Achieved   PT LONG TERM GOAL #5   Title Increase Six minute walk test distance to 1100   Status Achieved   PT LONG TERM GOAL #6   Title Demonstrate ability to step over a 6" obstacle while ambulating without loss of balance and without slowing down to clear the obstacle (as in DGI)    Status Achieved          Plan - March 13, 2014 1130    Clinical Impression Statement Pt has met all therapy goals and therapist and patient agreed that she is ready for discharge with the understanding that she will continue to perform her HEP at home, and instructions on how to progress the exercises have been provided.   PT Next Visit Plan d/c today   Consulted and Agree with Plan of Care Patient          G-Codes - 03-13-14 1150    Functional Assessment Tool Used DGI scored 22/24 today 03-13-2014   Functional Limitation Mobility: Walking and moving around   Mobility: Walking and Moving Around Current Status [phone]) --   Mobility: Walking and Moving Around Goal Status [phone]) At least 1 percent but less than 20 percent impaired, limited or restricted   Mobility: Walking and Moving Around Discharge Status [phone]) At least 1 percent but less than 20 percent impaired, limited or restricted      Problem List Patient Active Problem List   Diagnosis Date Noted  . Left-sided Bell's palsy 01/16/2014  . CVA (cerebral infarction) 12/16/2013  . NICM (nonischemic cardiomyopathy), Echo 08/26/13-EF 25-30%  09/17/2013  . Chronic systolic heart failure 62/83/1517  . Acute bronchitis 03/03/2011  . Wheezing 03/03/2011  . Uncontrolled type 2 diabetes mellitus 05/05/2010  . TRANSIENT ISCHEMIC ATTACK 05/05/2010  . HYPERTENSION 11/12/2007  . ASTHMA 11/12/2007  . GERD 11/12/2007  . Headache 11/12/2007  . NEPHROLITHIASIS, HX OF 11/12/2007     PHYSICAL THERAPY DISCHARGE SUMMARY  Visits from Start of Care: 11  Current functional level related to goals / functional outcomes: See goals section above met 5 of 6 LTGs  Remaining deficits: L gaze stabilization, left eyelid droop both are improving. Mild balance impairment, more-so due to visual impairment.   Education / Equipment: Home exercise program has been updated and pt instructed to continue working on it to maximize outcomes. Also educated on  fall prevention.   Plan: Patient agrees to discharge.  Patient goals were partially met. Patient is being discharged due to meeting the stated rehab goals.  ?????                                               Lilie Vezina, Anderson Malta D 03-13-14, 11:59 AM

## 2014-02-27 ENCOUNTER — Other Ambulatory Visit: Payer: Self-pay | Admitting: Pharmacist

## 2014-02-27 ENCOUNTER — Ambulatory Visit (INDEPENDENT_AMBULATORY_CARE_PROVIDER_SITE_OTHER): Payer: Medicare Other | Admitting: Cardiology

## 2014-02-27 ENCOUNTER — Ambulatory Visit (INDEPENDENT_AMBULATORY_CARE_PROVIDER_SITE_OTHER): Payer: Medicare Other | Admitting: Pharmacist

## 2014-02-27 ENCOUNTER — Encounter: Payer: Self-pay | Admitting: Cardiology

## 2014-02-27 ENCOUNTER — Encounter: Payer: Self-pay | Admitting: Pharmacist

## 2014-02-27 VITALS — BP 118/60 | HR 65 | Ht 62.0 in | Wt 169.0 lb

## 2014-02-27 VITALS — BP 140/80 | HR 60 | Ht 64.0 in | Wt 167.0 lb

## 2014-02-27 DIAGNOSIS — I10 Essential (primary) hypertension: Secondary | ICD-10-CM

## 2014-02-27 DIAGNOSIS — Z23 Encounter for immunization: Secondary | ICD-10-CM

## 2014-02-27 DIAGNOSIS — I5022 Chronic systolic (congestive) heart failure: Secondary | ICD-10-CM

## 2014-02-27 DIAGNOSIS — Z13820 Encounter for screening for osteoporosis: Secondary | ICD-10-CM

## 2014-02-27 DIAGNOSIS — Z0000 Encounter for general adult medical examination without abnormal findings: Secondary | ICD-10-CM

## 2014-02-27 DIAGNOSIS — Z79899 Other long term (current) drug therapy: Secondary | ICD-10-CM

## 2014-02-27 NOTE — Progress Notes (Signed)
HPI  The patient presents for follow up of dyspnea.  Was previously found to have an EF of 25%.  Cardiac catheterization demonstrated no coronary disease (a nonischemic etiology.  Since I last saw her she had a stroke versus does falsely. She was treated for probable stroke although MRI was nonacute. I did review the hospital records and she had an echocardiogram which suggested that the EF is about 35% perhaps slightly higher than reported previously. She did have Norvasc added to her meds for blood pressure control. She actually thinks her breathing is better. She does have facial droop and some leg weakness. She's tired overall. She thinks she is sleeping better and would like to defer her sleep apnea study for now. She denies any resting chest pressure, neck. She's not having any palpitations, presyncope or syncope. He's not having any PND or orthopnea.   Allergies  Allergen Reactions  . Penicillins Rash    Current Outpatient Prescriptions  Medication Sig Dispense Refill  . albuterol (PROVENTIL HFA;VENTOLIN HFA) 108 (90 BASE) MCG/ACT inhaler Inhale 2 puffs into the lungs every 6 (six) hours as needed. 1 Inhaler 6  . amLODipine (NORVASC) 10 MG tablet Take 1 tablet (10 mg total) by mouth daily. 90 tablet 3  . bisoprolol (ZEBETA) 5 MG tablet Take 1 tablet (5 mg total) by mouth daily. 30 tablet 11  . glucose blood (ONETOUCH VERIO) test strip Use as instructed 100 each 12  . losartan (COZAAR) 100 MG tablet Take 100 mg by mouth daily.     . metFORMIN (GLUCOPHAGE) 500 MG tablet Take 1 tablet (500 mg total) by mouth 2 (two) times daily with a meal. 180 tablet 3  . ONETOUCH DELICA LANCETS FINE MISC Use to check BG once daily 100 each 2  . research study medication Take 100 mg by mouth daily. Take as instructed.    . research study medication Take 90 mg by mouth 2 (two) times daily. Take as instructed.    Marland Kitchen spironolactone (ALDACTONE) 50 MG tablet Take 1 tablet (50 mg total) by mouth daily. 30  tablet 11  . traMADol (ULTRAM) 50 MG tablet Take 50 mg by mouth 3 (three) times daily as needed.     No current facility-administered medications for this visit.    Past Medical History  Diagnosis Date  . Asthma   . Hypertension   . GERD (gastroesophageal reflux disease)   . History of nephrolithiasis   . Headache(784.0)   . Type II or unspecified type diabetes mellitus without mention of complication, uncontrolled     borderline  . NICM (nonischemic cardiomyopathy)     a. LHC (08/2013):  no CAD  . Chronic systolic heart failure     Echo (08/26/13):  Mild LVH. EF 25% to 30%. Diffuse HK. Aortic valve: There was trivial regurgitation. Mitral valve: There was mild regurgitation. Left atrium: The atrium was mildly dilated.  . Stroke     Past Surgical History  Procedure Laterality Date  . Abdominal hysterectomy    . Oophorectomy      unilateral  . Ureteral extraction of kidney stone      ROS:  Otherwise as stated in the HPI and negative for all other systems.  PHYSICAL EXAM BP 140/80 mmHg  Pulse 60  Ht 5\' 4"  (1.626 m)  Wt 167 lb (75.751 kg)  BMI 28.65 kg/m2 GENERAL:  Well appearing HEENT:  Pupils equal round and reactive, fundi not visualized, oral mucosa unremarkable,  NECK:  Positive jugular venous  distention at 6 cm, waveform within normal limits, carotid upstroke brisk and symmetric, no bruits, no thyromegaly LYMPHATICS:  No cervical, inguinal adenopathy LUNGS:  Clear to auscultation bilaterally CHEST:  Unremarkable HEART:  PMI not displaced or sustained,S1 and S2 within normal limits, no S3, no S4, no clicks, no rubs, no murmurs ABD:  Flat, positive bowel sounds normal in frequency in pitch, no bruits, no rebound, no guarding, no midline pulsatile mass, no hepatomegaly, no splenomegaly SKIN:  No rashes no nodules NEURO:  Left facial droop, motor grossly intact throughout PSYCH:  Cognitively intact, oriented to person place and time   ASSESSMENT AND  PLAN  CARDIOMYOPATHY:    Her ejection fraction is stable.he might be slightly improved. She's not as symptomatic as she was. For now she will continue the meds as listed.  DYSPNEA:   See above.    HTN:  This is being managed in the context of treating his CHF.  She will be keeping a BP diary.    SLEEP APNEA:  We will hold off on doing a study for now at her request.

## 2014-02-27 NOTE — Progress Notes (Signed)
Patient ID: [name], female   DOB: 20-Jan-1946, 68 y.o.   MRN: [id number] Subjective:    [name] is a 68 y.o. female who presents for Medicare Initial Wellness Visit  Preventive Screening-Counseling & Management  Tobacco History  Smoking status  . Never Smoker   Smokeless tobacco  . Never Used    Current Problems (verified) Patient Active Problem List   Diagnosis Date Noted  . Left-sided Bell's palsy 01/16/2014  . CVA (cerebral infarction) 12/16/2013  . NICM (nonischemic cardiomyopathy), Echo 08/26/13-EF 25-30%  09/17/2013  . Chronic systolic heart failure 95/18/8416  . Acute bronchitis 03/03/2011  . Wheezing 03/03/2011  . Uncontrolled type 2 diabetes mellitus 05/05/2010  . TRANSIENT ISCHEMIC ATTACK 05/05/2010  . Essential hypertension 11/12/2007  . ASTHMA 11/12/2007  . GERD 11/12/2007  . Headache 11/12/2007  . NEPHROLITHIASIS, HX OF 11/12/2007    Medications Prior to Visit Current Outpatient Prescriptions on File Prior to Visit  Medication Sig Dispense Refill  . albuterol (PROVENTIL HFA;VENTOLIN HFA) 108 (90 BASE) MCG/ACT inhaler Inhale 2 puffs into the lungs every 6 (six) hours as needed. 1 Inhaler 6  . amLODipine (NORVASC) 10 MG tablet Take 1 tablet (10 mg total) by mouth daily. 90 tablet 3  . bisoprolol (ZEBETA) 5 MG tablet Take 1 tablet (5 mg total) by mouth daily. 30 tablet 11  . glucose blood (ONETOUCH VERIO) test strip Use as instructed 100 each 12  . losartan (COZAAR) 100 MG tablet Take 100 mg by mouth daily.     . metFORMIN (GLUCOPHAGE) 500 MG tablet Take 1 tablet (500 mg total) by mouth 2 (two) times daily with a meal. 180 tablet 3  . ONETOUCH DELICA LANCETS FINE MISC Use to check BG once daily 100 each 2  . research study medication Take 100 mg by mouth daily. Take as instructed.    . research study medication Take 90 mg by mouth 2 (two) times daily. Take as instructed.    Marland Kitchen spironolactone (ALDACTONE) 50 MG tablet Take 1 tablet (50 mg total) by  mouth daily. 30 tablet 11  . traMADol (ULTRAM) 50 MG tablet Take 50 mg by mouth 3 (three) times daily as needed.     No current facility-administered medications on file prior to visit.    Current Medications (verified) Current Outpatient Prescriptions  Medication Sig Dispense Refill  . albuterol (PROVENTIL HFA;VENTOLIN HFA) 108 (90 BASE) MCG/ACT inhaler Inhale 2 puffs into the lungs every 6 (six) hours as needed. 1 Inhaler 6  . amLODipine (NORVASC) 10 MG tablet Take 1 tablet (10 mg total) by mouth daily. 90 tablet 3  . bisoprolol (ZEBETA) 5 MG tablet Take 1 tablet (5 mg total) by mouth daily. 30 tablet 11  . glucose blood (ONETOUCH VERIO) test strip Use as instructed 100 each 12  . losartan (COZAAR) 100 MG tablet Take 100 mg by mouth daily.     . metFORMIN (GLUCOPHAGE) 500 MG tablet Take 1 tablet (500 mg total) by mouth 2 (two) times daily with a meal. 180 tablet 3  . ONETOUCH DELICA LANCETS FINE MISC Use to check BG once daily 100 each 2  . research study medication Take 100 mg by mouth daily. Take as instructed.    . research study medication Take 90 mg by mouth 2 (two) times daily. Take as instructed.    Marland Kitchen spironolactone (ALDACTONE) 50 MG tablet Take 1 tablet (50 mg total) by mouth daily. 30 tablet 11  . traMADol (ULTRAM) 50 MG tablet Take  50 mg by mouth 3 (three) times daily as needed.     No current facility-administered medications for this visit.     Allergies (verified) Penicillins   PAST HISTORY  Family History Family History  Problem Relation Age of Onset  . Hypertension Mother   . Arthritis Mother   . CAD Mother 42  . COPD Father   . Crohn's disease Sister   . Early death Brother 18    house fire  . Diabetes Maternal Grandmother   . Hypertension Sister   . Hypertension Brother     Social History History  Substance Use Topics  . Smoking status: Never Smoker   . Smokeless tobacco: Never Used  . Alcohol Use: Yes     Comment: rare     Are there smokers in  your home (other than you)? No  Risk Factors Current exercise habits: The patient does not participate in regular exercise at present.  Dietary issues discussed: limiting CHO currently - doing great job   Cardiac risk factors: advanced age (older than 20 for men, 87 for women), diabetes mellitus, dyslipidemia, hypertension, obesity (BMI >= 30 kg/m2) and sedentary lifestyle.  Depression Screen (Note: if answer to either of the following is "Yes", a more complete depression screening is indicated)   Over the past 2 weeks, have you felt down, depressed or hopeless? Yes  Over the past 2 weeks, have you felt little interest or pleasure in doing things? No  Have you lost interest or pleasure in daily life? No  Do you often feel hopeless? No  Do you cry easily over simple problems? Yes  Activities of Daily Living In your present state of health, do you have any difficulty performing the following activities?:   Daughter lives with patient and helps out when needed Driving? Yes - limiting amount currently due to stroke Managing money?  No Feeding yourself? No Getting from bed to chair? No  Climbing a flight of stairs? No  Preparing food and eating?: No Bathing or showering? No Getting dressed: No Getting to the toilet? No Using the toilet:No Moving around from place to place: Yes - can only walk abut 50 feet and then starts to fatigue but per patient this is improving over time In the past year have you fallen or had a near fall?:Yes   Are you sexually active?  No  Do you have more than one partner?  No  Hearing Difficulties: No Do you often ask people to speak up or repeat themselves? No Do you experience ringing or noises in your ears? No Do you have difficulty understanding soft or whispered voices? No   Do you feel that you have a problem with memory? Yes  Do you often misplace items? Yes  Do you feel safe at home?  Yes  Cognitive Testing  Alert? Yes  Normal  Appearance?Yes  Oriented to person? Yes  Place? Yes   Time? Yes  Recall of three objects?  Yes  Can perform simple calculations? Yes  Displays appropriate judgment?Yes  Can read the correct time from a watch face?Yes   Advanced Directives have been discussed with the patient? Yes  List the Names of Other Physician/Practitioners you currently use: 1.  [facility]ocherin - Cardiologist  Indicate any recent Medical Services you may have received from other than [facility] providers in the past year (date may be approximate).  Immunization History  Administered Date(s) Administered  . Pneumococcal Conjugate-13 02/27/2014  . Pneumococcal Polysaccharide-23 04/25/2010    Screening  Tests Health Maintenance  Topic Date Due  . OPHTHALMOLOGY EXAM  10/21/1954  . URINE MICROALBUMIN  10/21/1954  . COLONOSCOPY  10/21/1994  . ZOSTAVAX  04/13/2014 (Originally 10/20/2004)  . TETANUS/TDAP  04/13/2014 (Originally 10/21/1963)  . HEMOGLOBIN A1C  06/24/2014  . INFLUENZA VACCINE  11/09/2014  . FOOT EXAM  02/28/2015  . MAMMOGRAM  02/03/2016  . PNEUMOCOCCAL POLYSACCHARIDE VACCINE AGE 40 AND OVER  Completed    All answers were reviewed with the patient and necessary referrals were made:  Cherre Robins, [facility]   02/27/2014   History reviewed: allergies, current medications, past family history, past medical history, past social history, past surgical history and problem list   Objective:     Body mass index is 30.9 kg/(m^2). BP 118/60 mmHg  Pulse 65  Ht 5\' 2"  (1.575 m)  Wt 169 lb (76.658 kg)  BMI 30.90 kg/m2   Assessment:     Initial Medicare Wellness Visit Medication Management - patient c/o change in taste, wonders if related to stroke or metformin     Plan:     During the course of the visit the patient was educated and counseled about appropriate screening and preventive services including:    Pneumococcal vaccine - prevnar 13 given in office today  Influenza vaccine - UTD  Td vaccine -  patient declined due to cost  Zostavax - patient declined due to cost  Screening mammography - UTD  Bone densitometry screening - referral made today  Colorectal cancer screening - refused colonoscpoy but was given HEMOCULT card in office today  Glaucoma screening - Needed patient reminded  Advanced directives: has NO advanced directive - not interested in additional information  Decrease metformin to 500mg  daily - patient to check BG regularly and notify if BG increases to over 150.   Handicap sticker requested.  Will get her PCP to fill out paperwork for this  Discussed ASA daily - patient si currently in a study which prevents ASA or NSAIDS.  She will finish in about 1 month and will then start ASA 81mg  daily.    Patient Instructions (the written plan) was given to the patient.  Medicare Attestation I have personally reviewed: The patient's medical and social history Their use of alcohol, tobacco or illicit drugs Their current medications and supplements The patient's functional ability including ADLs,fall risks, home safety risks, cognitive, and hearing and visual impairment Diet and physical activities Evidence for depression or mood disorders  The patient's weight, height, BMI, and BP/HR have been recorded in the chart.  I have made referrals, counseling, and provided education to the patient based on review of the above and I have provided the patient with a written personalized care plan for preventive services.     Cherre Robins, [facility]   02/27/2014

## 2014-02-27 NOTE — Patient Instructions (Signed)
Your physician recommends that you schedule a follow-up appointment in: 3 months with Dr. Hochrein  

## 2014-02-27 NOTE — Patient Instructions (Addendum)
Health Maintenance Summary     Eye Exam - for diabetes / glaucoma Overdue 10/21/1954 Recommend make appointment     URINE MICROALBUMIN Overdue 10/21/1954 Trying to get today    COLONOSCOPY Overdue 10/21/1994 [facility] hemocult cards    ZOSTAVAX Postponed 04/13/2014 Cost is $103.38    TETANUS/TDAP Postponed 04/13/2014 Cost is $45    HEMOGLOBIN A1C Next Due 03/2014 Last was 5.6% (12/2013)    INFLUENZA VACCINE Next Due 11/09/2014 Last done Fall 2015    FOOT EXAM Next Due 02/28/2015    Bone Density    Referral sent today   Pneumonia Vaccine Completed Today 02/27/2014     MAMMOGRAM Next Due 02/03/2016 Last 02/02/2014        Fall Prevention and Home Safety Falls cause injuries and can affect all age groups. It is possible to use preventive measures to significantly decrease the likelihood of falls. There are many simple measures which can make your home safer and prevent falls. OUTDOORS  Repair cracks and edges of walkways and driveways.  Remove high doorway thresholds.  Trim shrubbery on the main path into your home.  Have good outside lighting.  Clear walkways of tools, rocks, debris, and clutter.  Check that handrails are not broken and are securely fastened. Both sides of steps should have handrails.  Have leaves, snow, and ice cleared regularly.  Use sand or salt on walkways during winter months.  In the garage, clean up grease or oil spills. BATHROOM  Install night lights.  Install grab bars by the toilet and in the tub and shower.  Use non-skid mats or decals in the tub or shower.  Place a plastic non-slip stool in the shower to sit on, if needed.  Keep floors dry and clean up all water on the floor immediately.  Remove soap buildup in the tub or shower on a regular basis.  Secure bath mats with non-slip, double-sided rug tape.  Remove throw rugs and tripping hazards from the floors. BEDROOMS  Install night lights.  Make sure a bedside light is easy to reach.  Do not  use oversized bedding.  Keep a telephone by your bedside.  Have a firm chair with side arms to use for getting dressed.  Remove throw rugs and tripping hazards from the floor. KITCHEN  Keep handles on pots and pans turned toward the center of the stove. Use back burners when possible.  Clean up spills quickly and allow time for drying.  Avoid walking on wet floors.  Avoid hot utensils and knives.  Position shelves so they are not too high or low.  Place commonly used objects within easy reach.  If necessary, use a sturdy step stool with a grab bar when reaching.  Keep electrical cables out of the way.  Do not use floor polish or wax that makes floors slippery. If you must use wax, use non-skid floor wax.  Remove throw rugs and tripping hazards from the floor. STAIRWAYS  Never leave objects on stairs.  Place handrails on both sides of stairways and use them. Fix any loose handrails. Make sure handrails on both sides of the stairways are as long as the stairs.  Check carpeting to make sure it is firmly attached along stairs. Make repairs to worn or loose carpet promptly.  Avoid placing throw rugs at the top or bottom of stairways, or properly secure the rug with carpet tape to prevent slippage. Get rid of throw rugs, if possible.  Have an electrician put in a  light switch at the top and bottom of the stairs. OTHER FALL PREVENTION TIPS  Wear low-heel or rubber-soled shoes that are supportive and fit well. Wear closed toe shoes.  When using a stepladder, make sure it is fully opened and both spreaders are firmly locked. Do not climb a closed stepladder.  Add color or contrast paint or tape to grab bars and handrails in your home. Place contrasting color strips on first and last steps.  Learn and use mobility aids as needed. Install an electrical emergency response system.  Turn on lights to avoid dark areas. Replace light bulbs that burn out immediately. Get light  switches that glow.  Arrange furniture to create clear pathways. Keep furniture in the same place.  Firmly attach carpet with non-skid or double-sided tape.  Eliminate uneven floor surfaces.  Select a carpet pattern that does not visually hide the edge of steps.  Be aware of all pets. OTHER HOME SAFETY TIPS  Set the water temperature for 120 F (48.8 C).  Keep emergency numbers on or near the telephone.  Keep smoke detectors on every level of the home and near sleeping areas. Document Released: 03/17/2002 Document Revised: 09/26/2011 Document Reviewed: 06/16/2011 [facility] Patient Information 2015 [place], [place]. This information is not intended to replace advice given to you by your health care provider. Make sure you discuss any questions you have with your health care provider. Pneumococcal Vaccine, Polyvalent suspension for injection What is this medicine? PNEUMOCOCCAL VACCINE, POLYVALENT (NEU mo KOK al vak SEEN, pol ee VEY luhnt) is a vaccine to prevent pneumococcus bacteria infection. These bacteria are a major cause of ear infections, 'Strep throat' infections, and serious pneumonia, meningitis, or blood infections worldwide. These vaccines help the body to produce antibodies (protective substances) that help your body defend against these bacteria. This vaccine is recommended for infants and young children. This vaccine will not treat an infection. This medicine may be used for other purposes; ask your health care provider or pharmacist if you have questions. COMMON BRAND NAME(S): Prevnar 13 What should I tell my health care provider before I take this medicine? They need to know if you have any of these conditions: -bleeding problems -fever -immune system problems -low platelet count in the blood -seizures -an unusual or allergic reaction to pneumococcal vaccine, diphtheria toxoid, other vaccines, latex, other medicines, foods, dyes, or preservatives -pregnant or trying to  get pregnant -breast-feeding How should I use this medicine? This vaccine is for injection into a muscle. It is given by a health care professional. A copy of Vaccine Information Statements will be given before each vaccination. Read this sheet carefully each time. The sheet may change frequently. Talk to your pediatrician regarding the use of this medicine in children. While this drug may be prescribed for children as young as 76 weeks old for selected conditions, precautions do apply. Overdosage: If you think you have taken too much of this medicine contact a poison control center or emergency room at once. NOTE: This medicine is only for you. Do not share this medicine with others. What if I miss a dose? It is important not to miss your dose. Call your doctor or health care professional if you are unable to keep an appointment. What may interact with this medicine? -medicines for cancer chemotherapy -medicines that suppress your immune function -medicines that treat or prevent blood clots like warfarin, enoxaparin, and dalteparin -steroid medicines like prednisone or cortisone This list may not describe all possible interactions. Give your  health care provider a list of all the medicines, herbs, non-prescription drugs, or dietary supplements you use. Also tell them if you smoke, drink alcohol, or use illegal drugs. Some items may interact with your medicine. What should I watch for while using this medicine? Mild fever and pain should go away in 3 days or less. Report any unusual symptoms to your doctor or health care professional. What side effects may I notice from receiving this medicine? Side effects that you should report to your doctor or health care professional as soon as possible: -allergic reactions like skin rash, itching or hives, swelling of the face, lips, or tongue -breathing problems -confused -fever over 102 degrees F -pain, tingling, numbness in the hands or  feet -seizures -unusual bleeding or bruising -unusual muscle weakness Side effects that usually do not require medical attention (report to your doctor or health care professional if they continue or are bothersome): -aches and pains -diarrhea -fever of 102 degrees F or less -headache -irritable -loss of appetite -pain, tender at site where injected -trouble sleeping This list may not describe all possible side effects. Call your doctor for medical advice about side effects. You may report side effects to FDA at 1-800-FDA-1088. Where should I keep my medicine? This does not apply. This vaccine is given in a clinic, pharmacy, doctor's office, or other health care setting and will not be stored at home. NOTE: This sheet is a summary. It may not cover all possible information. If you have questions about this medicine, talk to your doctor, pharmacist, or health care provider.  2015, Elsevier/Gold Standard. (2008-06-09 10:17:22)

## 2014-03-02 ENCOUNTER — Ambulatory Visit: Payer: Medicare Other

## 2014-03-03 DIAGNOSIS — Z0289 Encounter for other administrative examinations: Secondary | ICD-10-CM

## 2014-03-04 ENCOUNTER — Ambulatory Visit: Payer: Medicare Other

## 2014-03-09 ENCOUNTER — Ambulatory Visit: Payer: Medicare Other

## 2014-03-17 ENCOUNTER — Encounter (INDEPENDENT_AMBULATORY_CARE_PROVIDER_SITE_OTHER): Payer: Self-pay | Admitting: Neurology

## 2014-03-17 DIAGNOSIS — Z0289 Encounter for other administrative examinations: Secondary | ICD-10-CM

## 2014-03-19 ENCOUNTER — Encounter (HOSPITAL_COMMUNITY): Payer: Self-pay | Admitting: Cardiovascular Disease

## 2014-03-30 ENCOUNTER — Encounter (HOSPITAL_BASED_OUTPATIENT_CLINIC_OR_DEPARTMENT_OTHER): Payer: Medicare Other

## 2014-04-29 ENCOUNTER — Encounter: Payer: Self-pay | Admitting: Family

## 2014-04-29 ENCOUNTER — Ambulatory Visit (INDEPENDENT_AMBULATORY_CARE_PROVIDER_SITE_OTHER): Payer: Medicare Other | Admitting: Family

## 2014-04-29 VITALS — BP 160/72 | HR 76 | Temp 97.2°F | Ht 62.0 in | Wt 172.6 lb

## 2014-04-29 DIAGNOSIS — R319 Hematuria, unspecified: Secondary | ICD-10-CM | POA: Diagnosis not present

## 2014-04-29 DIAGNOSIS — N390 Urinary tract infection, site not specified: Secondary | ICD-10-CM | POA: Diagnosis not present

## 2014-04-29 LAB — POCT URINALYSIS DIPSTICK
Bilirubin, UA: NEGATIVE
Glucose, UA: NEGATIVE
Ketones, UA: NEGATIVE
Nitrite, UA: NEGATIVE
Spec Grav, UA: 1.02
Urobilinogen, UA: NEGATIVE
pH, UA: 5

## 2014-04-29 LAB — POCT UA - MICROSCOPIC ONLY
Casts, Ur, LPF, POC: NEGATIVE
Crystals, Ur, HPF, POC: NEGATIVE
Mucus, UA: NEGATIVE
Yeast, UA: NEGATIVE

## 2014-04-29 MED ORDER — SULFAMETHOXAZOLE-TRIMETHOPRIM 800-160 MG PO TABS: 1.0000 | ORAL_TABLET | Freq: Two times a day (BID) | ORAL | Status: DC

## 2014-04-29 NOTE — Progress Notes (Signed)
   Subjective:    Patient ID: [name], female    DOB: 28-Sep-1945, 69 y.o.   MRN: [id number]  Hematuria This is a recurrent problem. The current episode started in the past 7 days. The problem is unchanged. She describes the hematuria as gross hematuria. She reports no clotting in her urine stream. Her pain is at a severity of 8/10. The pain is moderate. She describes her urine color as dark red. Irritative symptoms include frequency, nocturia and urgency. Obstructive symptoms do not include straining. Associated symptoms include flank pain. Pertinent negatives include no dysuria or fever. Her past medical history is significant for kidney stones.      Review of Systems  Constitutional: Negative.  Negative for fever.  HENT: Negative.   Eyes: Negative.   Respiratory: Negative.  Negative for shortness of breath.   Cardiovascular: Negative.  Negative for palpitations.  Gastrointestinal: Negative.   Endocrine: Negative.   Genitourinary: Positive for urgency, frequency, hematuria, flank pain and nocturia. Negative for dysuria.  Musculoskeletal: Negative.   Neurological: Negative.  Negative for headaches.  Hematological: Negative.   Psychiatric/Behavioral: Negative.   All other systems reviewed and are negative.      Objective:   Physical Exam  Constitutional: She is oriented to person, place, and time. She appears well-developed and well-nourished. No distress.  Cardiovascular: Normal rate, regular rhythm, normal heart sounds and intact distal pulses.   No murmur heard. Pulmonary/Chest: Effort normal and breath sounds normal. No respiratory distress. She has no wheezes.  Abdominal: Soft. Bowel sounds are normal. She exhibits no distension. There is no tenderness.  Musculoskeletal: Normal range of motion. She exhibits no edema or tenderness.  Neg for CVA tenderness   Neurological: She is alert and oriented to person, place, and time. She has normal reflexes. No cranial nerve  deficit.  Skin: Skin is warm and dry.  Psychiatric: She has a normal mood and affect. Her behavior is normal. Judgment and thought content normal.  Vitals reviewed.    BP 160/72 mmHg  Pulse 76  Temp(Src) 97.2 F (36.2 C) (Oral)  Ht 5\' 2"  (1.575 m)  Wt 172 lb 9.6 oz (78.291 kg)  BMI 31.56 kg/m2      Assessment & Plan:  1. Hematuria - POCT UA - Microscopic Only - POCT urinalysis dipstick  2. Urinary tract infection with hematuria, site unspecified -Force fluids AZO over the counter X2 days RTO prn Culture pending - sulfamethoxazole-trimethoprim (BACTRIM DS,SEPTRA DS) 800-160 MG per tablet; Take 1 tablet by mouth 2 (two) times daily.  Dispense: 10 tablet; Refill: 0 - Urine culture  Evelina Dun, FNP

## 2014-04-29 NOTE — Patient Instructions (Signed)

## 2014-05-01 LAB — URINE CULTURE: Organism ID, Bacteria: NO GROWTH

## 2014-05-05 ENCOUNTER — Ambulatory Visit: Payer: Self-pay | Admitting: Family

## 2014-05-13 ENCOUNTER — Ambulatory Visit: Payer: Self-pay | Admitting: Neurology

## 2014-05-28 ENCOUNTER — Ambulatory Visit (INDEPENDENT_AMBULATORY_CARE_PROVIDER_SITE_OTHER): Payer: Medicare Other

## 2014-05-28 ENCOUNTER — Encounter: Payer: Self-pay | Admitting: Family Medicine

## 2014-05-28 ENCOUNTER — Ambulatory Visit (INDEPENDENT_AMBULATORY_CARE_PROVIDER_SITE_OTHER): Payer: Medicare Other | Admitting: Family Medicine

## 2014-05-28 VITALS — BP 140/78 | HR 105 | Temp 99.0°F | Ht 62.0 in | Wt 174.0 lb

## 2014-05-28 DIAGNOSIS — J40 Bronchitis, not specified as acute or chronic: Secondary | ICD-10-CM

## 2014-05-28 DIAGNOSIS — R05 Cough: Secondary | ICD-10-CM

## 2014-05-28 DIAGNOSIS — R059 Cough, unspecified: Secondary | ICD-10-CM

## 2014-05-28 DIAGNOSIS — E1165 Type 2 diabetes mellitus with hyperglycemia: Secondary | ICD-10-CM | POA: Diagnosis not present

## 2014-05-28 DIAGNOSIS — J302 Other seasonal allergic rhinitis: Secondary | ICD-10-CM | POA: Diagnosis not present

## 2014-05-28 DIAGNOSIS — J3089 Other allergic rhinitis: Secondary | ICD-10-CM | POA: Diagnosis not present

## 2014-05-28 DIAGNOSIS — J209 Acute bronchitis, unspecified: Secondary | ICD-10-CM

## 2014-05-28 MED ORDER — AZITHROMYCIN 250 MG PO TABS: ORAL_TABLET | ORAL | Status: DC

## 2014-05-28 MED ORDER — BUDESONIDE-FORMOTEROL FUMARATE 160-4.5 MCG/ACT IN AERO: 2.0000 | INHALATION_SPRAY | Freq: Two times a day (BID) | RESPIRATORY_TRACT | Status: DC

## 2014-05-28 NOTE — Patient Instructions (Signed)
Continue to drink plenty of fluids Use a cool mist humidifier at home Take Mucinex, maximum strength, blue and white in color, over-the-counter, 1 twice daily with a large glass of water to help loosen cough and congestion Use saline nose spray over-the-counter as needed for nasal congestion Take antibiotic as directed Use Symbicort inhaler 160/4.52 puffs twice daily and rinse mouth after using Use albuterol inhaler as a rescue inhaler

## 2014-05-28 NOTE — Progress Notes (Signed)
Subjective:    Patient ID: [name], female    DOB: 01-29-1946, 69 y.o.   MRN: [id number]  HPI Patient here today for cough, congestion and sneezing that started on Sunday. She has more shortness of breath at night. She is only using her albuterol inhaler as needed. She does have a history of diabetes and cardiomyopathy and is supposed to see Dr. Percival Spanish, the cardiologist tomorrow but is going t change the appointment time because of being sick. She also has a history of diabetes and says this has been under good control. She has hypertension and says her blood pressures have been running in the 998P for the systolic readings at home.        Patient Active Problem List   Diagnosis Date Noted  . Left-sided Bell's palsy 01/16/2014  . CVA (cerebral infarction) 12/16/2013  . NICM (nonischemic cardiomyopathy), Echo 08/26/13-EF 25-30%  09/17/2013  . Chronic systolic heart failure 38/25/0539  . Acute bronchitis 03/03/2011  . Wheezing 03/03/2011  . Uncontrolled type 2 diabetes mellitus 05/05/2010  . TRANSIENT ISCHEMIC ATTACK 05/05/2010  . Essential hypertension 11/12/2007  . ASTHMA 11/12/2007  . GERD 11/12/2007  . Headache 11/12/2007  . NEPHROLITHIASIS, HX OF 11/12/2007   Outpatient Encounter Prescriptions as of 05/28/2014  Medication Sig  . albuterol (PROVENTIL HFA;VENTOLIN HFA) 108 (90 BASE) MCG/ACT inhaler Inhale 2 puffs into the lungs every 6 (six) hours as needed.  Marland Kitchen amLODipine (NORVASC) 10 MG tablet Take 1 tablet (10 mg total) by mouth daily.  . bisoprolol (ZEBETA) 5 MG tablet Take 1 tablet (5 mg total) by mouth daily.  Marland Kitchen glucose blood (ONETOUCH VERIO) test strip Use as instructed  . losartan (COZAAR) 100 MG tablet Take 100 mg by mouth daily.   . metFORMIN (GLUCOPHAGE) 500 MG tablet Take 1 tablet (500 mg total) by mouth 2 (two) times daily with a meal.  . ONETOUCH DELICA LANCETS FINE MISC Use to check BG once daily  . research study medication Take 100 mg by mouth daily.  Take as instructed.  . research study medication Take 90 mg by mouth 2 (two) times daily. Take as instructed.  Marland Kitchen spironolactone (ALDACTONE) 50 MG tablet Take 1 tablet (50 mg total) by mouth daily.  Marland Kitchen sulfamethoxazole-trimethoprim (BACTRIM DS,SEPTRA DS) 800-160 MG per tablet Take 1 tablet by mouth 2 (two) times daily.  . traMADol (ULTRAM) 50 MG tablet Take 50 mg by mouth 3 (three) times daily as needed.    Review of Systems  Constitutional: Negative.   HENT: Positive for congestion, ear discharge, postnasal drip, sneezing and sore throat (drainage).   Eyes: Negative.   Respiratory: Positive for cough and wheezing.   Cardiovascular: Negative.   Gastrointestinal: Negative.   Endocrine: Negative.   Genitourinary: Negative.   Musculoskeletal: Negative.   Skin: Negative.   Allergic/Immunologic: Negative.   Neurological: Negative.  Negative for dizziness and headaches.  Hematological: Negative.   Psychiatric/Behavioral: Negative.        Objective:   Physical Exam  Constitutional: She is oriented to person, place, and time. She appears well-developed and well-nourished. No distress.  Alert and cooperative  HENT:  Head: Normocephalic and atraumatic.  Right Ear: External ear normal.  Left Ear: External ear normal.  The throat is red posteriorly and there is nasal congestion bilaterally  Eyes: Conjunctivae and EOM are normal. Pupils are equal, round, and reactive to light. Right eye exhibits no discharge. Left eye exhibits no discharge. No scleral icterus.  Neck: Normal range of motion.  Neck supple. No thyromegaly present.  No anterior cervical nodes  Cardiovascular: Normal rate, regular rhythm and normal heart sounds.   No murmur heard. The heart is regular at 72/m  Pulmonary/Chest: Effort normal. No respiratory distress. She has wheezes. She has no rales. She exhibits no tenderness.  A few wheezes were notable on inhalation on the right side posteriorly.  Abdominal: Bowel sounds are  normal. She exhibits no mass.  Musculoskeletal: Normal range of motion. She exhibits no edema.  Lymphadenopathy:    She has no cervical adenopathy.  Neurological: She is alert and oriented to person, place, and time.  Skin: Skin is warm and dry. No rash noted.  Psychiatric: She has a normal mood and affect. Her behavior is normal. Judgment and thought content normal.  Nursing note and vitals reviewed.  BP 140/78 mmHg  Pulse 105  Temp(Src) 99 F (37.2 C) (Oral)  Ht 5\' 2"  (1.575 m)  Wt 174 lb (78.926 kg)  BMI 31.82 kg/m2  [facility] reading (PRIMARY) by  Dr.Michael Walrath-chest x-ray--the heart is slightly enlarged and there is some thoracic aortic atherosclerosis with no other active disease issues  The results of the CBC were not available when the patient left the office.                                         Assessment & Plan:  1. Cough -Take Mucinex maximum strength plain and blue and white in color one twice daily for cough and congestion and use cool mist humidification - POCT CBC - DG Chest 2 View; Future - budesonide-formoterol (SYMBICORT) 160-4.5 MCG/ACT inhaler; Inhale 2 puffs into the lungs 2 (two) times daily.  Dispense: 1 Inhaler; Refill: 3  2. Bronchitis with bronchospasm -Use cool mist humidification and take antibiotic - azithromycin (ZITHROMAX) 250 MG tablet; 2 pills the first day then one daily for infection until completed  Dispense: 6 tablet; Refill: 0  3. Other seasonal allergic rhinitis -Use saline nose spray  4. Type 2 diabetes mellitus with hyperglycemia -Monitor blood sugars closely  Patient Instructions  Continue to drink plenty of fluids Use a cool mist humidifier at home Take Mucinex, maximum strength, blue and white in color, over-the-counter, 1 twice daily with a large glass of water to help loosen cough and congestion Use saline nose spray over-the-counter as needed for nasal congestion Take antibiotic as directed Use Symbicort inhaler 160/4.52 puffs  twice daily and rinse mouth after using Use albuterol inhaler as a rescue inhaler   Arrie Senate MD

## 2014-05-29 ENCOUNTER — Ambulatory Visit: Payer: Medicare Other | Admitting: Cardiology

## 2014-05-29 LAB — CBC WITH DIFFERENTIAL/PLATELET
Basophils Absolute: 0.1 10*3/uL (ref 0.0–0.2)
Basos: 1 %
Eos: 5 %
Eosinophils Absolute: 0.4 10*3/uL (ref 0.0–0.4)
HCT: 39.8 % (ref 34.0–46.6)
Hemoglobin: 13.2 g/dL (ref 11.1–15.9)
Immature Grans (Abs): 0 10*3/uL (ref 0.0–0.1)
Immature Granulocytes: 0 %
Lymphocytes Absolute: 2 10*3/uL (ref 0.7–3.1)
Lymphs: 22 %
MCH: 30 pg (ref 26.6–33.0)
MCHC: 33.2 g/dL (ref 31.5–35.7)
MCV: 91 fL (ref 79–97)
Monocytes Absolute: 1.1 10*3/uL — ABNORMAL HIGH (ref 0.1–0.9)
Monocytes: 11 %
Neutrophils Absolute: 5.6 10*3/uL (ref 1.4–7.0)
Neutrophils Relative %: 61 %
Platelets: 250 10*3/uL (ref 150–379)
RBC: 4.4 x10E6/uL (ref 3.77–5.28)
RDW: 13.9 % (ref 12.3–15.4)
WBC: 9.2 10*3/uL (ref 3.4–10.8)

## 2014-06-05 ENCOUNTER — Telehealth: Payer: Self-pay | Admitting: Family Medicine

## 2014-06-05 MED ORDER — LEVOFLOXACIN 500 MG PO TABS: 500.0000 mg | ORAL_TABLET | Freq: Every day | ORAL | Status: DC

## 2014-06-05 NOTE — Telephone Encounter (Signed)
Please review and advise.

## 2014-06-05 NOTE — Telephone Encounter (Signed)
Please call a prescription in for Levaquin 500 No. 7 one daily. Have her continue with Mucinex fluids and Symbicort. Remind her that reviewing chest x-ray did not show any active lung problems.

## 2014-06-08 ENCOUNTER — Ambulatory Visit (INDEPENDENT_AMBULATORY_CARE_PROVIDER_SITE_OTHER): Payer: Medicare Other | Admitting: Family Medicine

## 2014-06-08 ENCOUNTER — Encounter: Payer: Self-pay | Admitting: Family Medicine

## 2014-06-08 VITALS — BP 127/75 | HR 84 | Temp 97.0°F | Ht 62.0 in | Wt 175.0 lb

## 2014-06-08 DIAGNOSIS — J302 Other seasonal allergic rhinitis: Secondary | ICD-10-CM

## 2014-06-08 DIAGNOSIS — J40 Bronchitis, not specified as acute or chronic: Secondary | ICD-10-CM

## 2014-06-08 DIAGNOSIS — Z87442 Personal history of urinary calculi: Secondary | ICD-10-CM | POA: Diagnosis not present

## 2014-06-08 DIAGNOSIS — R319 Hematuria, unspecified: Secondary | ICD-10-CM | POA: Diagnosis not present

## 2014-06-08 DIAGNOSIS — J209 Acute bronchitis, unspecified: Secondary | ICD-10-CM

## 2014-06-08 DIAGNOSIS — H1011 Acute atopic conjunctivitis, right eye: Secondary | ICD-10-CM

## 2014-06-08 LAB — POCT URINALYSIS DIPSTICK
Bilirubin, UA: NEGATIVE
Glucose, UA: NEGATIVE
Ketones, UA: NEGATIVE
Nitrite, UA: NEGATIVE
Spec Grav, UA: >=1.03
Urobilinogen, UA: NEGATIVE
pH, UA: 6

## 2014-06-08 LAB — POCT UA - MICROSCOPIC ONLY
Casts, Ur, LPF, POC: NEGATIVE
Crystals, Ur, HPF, POC: NEGATIVE
Epithelial cells, urine per micros: NEGATIVE
Mucus, UA: NEGATIVE
RBC, urine, microscopic: NEGATIVE
Yeast, UA: NEGATIVE

## 2014-06-08 MED ORDER — FLUTICASONE PROPIONATE 50 MCG/ACT NA SUSP: 2.0000 | Freq: Every day | NASAL | Status: DC

## 2014-06-08 MED ORDER — KETOROLAC TROMETHAMINE 0.5 % OP SOLN: 1.0000 [drp] | Freq: Four times a day (QID) | OPHTHALMIC | Status: DC

## 2014-06-08 NOTE — Patient Instructions (Signed)
Continue to drink plenty of fluids Continue to use Symbicort regularly for the next 3-4 weeks and gradually taper off of this. Continue to take Mucinex one twice daily for cough and congestion Recheck a urinalysis late next week since we're checking one today--- if that one is negative wait 4 weeks and check one more to be assured that there is no more blood in the urine. Use saline nose spray during the day and Flonase at nighttime Use allergy eyedrop as directed

## 2014-06-08 NOTE — Progress Notes (Signed)
Subjective:    Patient ID: [name], female    DOB: November 09, 1945, 69 y.o.   MRN: [id number]  HPI Patient here today for 10 day follow up on bronchitis. She is still taking the antibiotic and is feeling some better. The patient is doing better. She is alert and appears to be feeling better also. She is still using her Symbicort and has about 3 or 4 more days of antibiotic to take until that is completed. On reviewing her most recent information a urine culture was negative the chest x-ray only showed a slightly enlarged heart with no active disease and the CBC did not have an elevated white blood cell count.           Patient Active Problem List   Diagnosis Date Noted  . Left-sided Bell's palsy 01/16/2014  . CVA (cerebral infarction) 12/16/2013  . NICM (nonischemic cardiomyopathy), Echo 08/26/13-EF 25-30%  09/17/2013  . Chronic systolic heart failure 12/87/8676  . Acute bronchitis 03/03/2011  . Wheezing 03/03/2011  . Uncontrolled type 2 diabetes mellitus 05/05/2010  . TRANSIENT ISCHEMIC ATTACK 05/05/2010  . Essential hypertension 11/12/2007  . ASTHMA 11/12/2007  . GERD 11/12/2007  . Headache 11/12/2007  . NEPHROLITHIASIS, HX OF 11/12/2007   Outpatient Encounter Prescriptions as of 06/08/2014  Medication Sig  . albuterol (PROVENTIL HFA;VENTOLIN HFA) 108 (90 BASE) MCG/ACT inhaler Inhale 2 puffs into the lungs every 6 (six) hours as needed.  Marland Kitchen amLODipine (NORVASC) 10 MG tablet Take 1 tablet (10 mg total) by mouth daily.  . bisoprolol (ZEBETA) 5 MG tablet Take 1 tablet (5 mg total) by mouth daily.  . budesonide-formoterol (SYMBICORT) 160-4.5 MCG/ACT inhaler Inhale 2 puffs into the lungs 2 (two) times daily.  Marland Kitchen glucose blood (ONETOUCH VERIO) test strip Use as instructed  . levofloxacin (LEVAQUIN) 500 MG tablet Take 1 tablet (500 mg total) by mouth daily.  Marland Kitchen losartan (COZAAR) 100 MG tablet Take 100 mg by mouth daily.   . metFORMIN (GLUCOPHAGE) 500 MG tablet Take 1 tablet (500  mg total) by mouth 2 (two) times daily with a meal.  . ONETOUCH DELICA LANCETS FINE MISC Use to check BG once daily  . spironolactone (ALDACTONE) 50 MG tablet Take 1 tablet (50 mg total) by mouth daily.  . traMADol (ULTRAM) 50 MG tablet Take 50 mg by mouth 3 (three) times daily as needed.  . [DISCONTINUED] azithromycin (ZITHROMAX) 250 MG tablet 2 pills the first day then one daily for infection until completed    Review of Systems  Constitutional: Negative.   HENT: Positive for congestion.   Eyes: Negative.   Respiratory: Positive for cough (getting better).   Cardiovascular: Negative.   Gastrointestinal: Negative.   Endocrine: Negative.   Genitourinary: Positive for hematuria.  Musculoskeletal: Negative.   Skin: Negative.   Allergic/Immunologic: Negative.   Neurological: Negative.   Hematological: Negative.   Psychiatric/Behavioral: Negative.        Objective:   Physical Exam  Constitutional: She is oriented to person, place, and time. She appears well-developed and well-nourished. No distress.  HENT:  Head: Normocephalic and atraumatic.  Right Ear: External ear normal.  Left Ear: External ear normal.  Nose: Nose normal.  Mouth/Throat: Oropharynx is clear and moist.  Eyes: Conjunctivae and EOM are normal. Pupils are equal, round, and reactive to light. Right eye exhibits no discharge. Left eye exhibits no discharge. No scleral icterus.  The right eyes have red and irritated on the conjunctiva. She has residuals of Bell's palsy in the  left eye.  Neck: Normal range of motion. Neck supple. No thyromegaly present.  No anterior cervical nodes  Cardiovascular: Normal rate and normal heart sounds.   No murmur heard. The heart is slightly irregular at 72/m  Pulmonary/Chest: Effort normal and breath sounds normal. No respiratory distress. She has no wheezes. She has no rales. She exhibits no tenderness.  The lungs are much clearer with no wheezes rales or rhonchi.  Abdominal: Soft.  She exhibits no distension and no mass. There is tenderness. There is no rebound and no guarding.  There is slight suprapubic tenderness  Musculoskeletal: Normal range of motion. She exhibits no edema.  Lymphadenopathy:    She has no cervical adenopathy.  Neurological: She is alert and oriented to person, place, and time. She has normal reflexes.  Skin: Skin is warm and dry. No rash noted.  Psychiatric: She has a normal mood and affect. Her behavior is normal. Judgment and thought content normal.  Nursing note and vitals reviewed.   BP 127/75 mmHg  Pulse 84  Temp(Src) 97 F (36.1 C) (Oral)  Ht 5\' 2"  (1.575 m)  Wt 175 lb (79.379 kg)  BMI 32.00 kg/m2       Assessment & Plan:  1. Hematuria -Check a urinalysis today and weight 10-14 days and check one more. - POCT urinalysis dipstick - POCT UA - Microscopic Only - Urine culture  2. Bronchitis with bronchospasm -Continue and finish antibiotic and continue using long-acting bronchodilator  3. Other seasonal allergic rhinitis -Flonase 1 spray each nostril at bedtime  4. Allergic conjunctivitis, right -Use Acular as directed for the next week to 10 days to the right eye  5. Personal history of kidney stones -If the blood in the urine continues we'll have the urologist make sure that there are no kidney stones that are affecting the flow of urine from the kidney.  Meds ordered this encounter  Medications  . fluticasone (FLONASE) 50 MCG/ACT nasal spray    Sig: Place 2 sprays into both nostrils daily.    Dispense:  16 g    Refill:  1  . ketorolac (ACULAR) 0.5 % ophthalmic solution    Sig: Place 1 drop into both eyes 4 (four) times daily.    Dispense:  5 mL    Refill:  1   Patient Instructions  Continue to drink plenty of fluids Continue to use Symbicort regularly for the next 3-4 weeks and gradually taper off of this. Continue to take Mucinex one twice daily for cough and congestion Recheck a urinalysis late next week  since we're checking one today--- if that one is negative wait 4 weeks and check one more to be assured that there is no more blood in the urine. Use saline nose spray during the day and Flonase at nighttime Use allergy eyedrop as directed    Arrie Senate MD

## 2014-06-09 LAB — URINE CULTURE: Organism ID, Bacteria: NO GROWTH

## 2014-06-10 ENCOUNTER — Telehealth: Payer: Self-pay | Admitting: *Deleted

## 2014-06-10 NOTE — Telephone Encounter (Signed)
-----   Message from [place], LPN sent at 09/17/6787  7:40 AM EST -----   ----- Message -----    From: Chipper Herb, MD    Sent: 06/08/2014   7:10 PM      To: Cameron Proud Bullins, LPN, [facility]  The urine specimen done on Monday had 20-30 WBC and no red blood cells. She should continue and complete the current antibiotic and please make sure that the lab does a culture on this urine. Please let the patient know that there is still some infection cells in the urine and that we will do a culture and sensitivity to see if the antibiotic that she is taking now may not cover the bacteria that are causing the urinary tract infection

## 2014-06-10 NOTE — Telephone Encounter (Signed)
Pt notified of results

## 2014-07-03 DIAGNOSIS — H40033 Anatomical narrow angle, bilateral: Secondary | ICD-10-CM | POA: Diagnosis not present

## 2014-07-03 DIAGNOSIS — E119 Type 2 diabetes mellitus without complications: Secondary | ICD-10-CM | POA: Diagnosis not present

## 2014-07-03 LAB — HM DIABETES EYE EXAM

## 2014-07-07 ENCOUNTER — Other Ambulatory Visit: Payer: Self-pay | Admitting: *Deleted

## 2014-07-07 DIAGNOSIS — Z780 Asymptomatic menopausal state: Secondary | ICD-10-CM

## 2014-07-07 DIAGNOSIS — Z13820 Encounter for screening for osteoporosis: Secondary | ICD-10-CM

## 2014-07-08 ENCOUNTER — Encounter: Payer: Self-pay | Admitting: Cardiology

## 2014-07-08 ENCOUNTER — Ambulatory Visit (INDEPENDENT_AMBULATORY_CARE_PROVIDER_SITE_OTHER): Payer: Medicare Other | Admitting: Cardiology

## 2014-07-08 VITALS — BP 122/70 | HR 72 | Ht 64.0 in | Wt 178.0 lb

## 2014-07-08 DIAGNOSIS — I429 Cardiomyopathy, unspecified: Secondary | ICD-10-CM | POA: Diagnosis not present

## 2014-07-08 DIAGNOSIS — I5022 Chronic systolic (congestive) heart failure: Secondary | ICD-10-CM | POA: Diagnosis not present

## 2014-07-08 DIAGNOSIS — I10 Essential (primary) hypertension: Secondary | ICD-10-CM | POA: Diagnosis not present

## 2014-07-08 DIAGNOSIS — I428 Other cardiomyopathies: Secondary | ICD-10-CM

## 2014-07-08 NOTE — Progress Notes (Signed)
HPI  The patient presents for follow up of dyspnea.  Was previously found to have an EF of 25%.  Her last EF is about 35% perhaps slightly higher than reported previously. She did have Norvasc added to her meds for blood pressure control prior to the last visit. She actually her breathing is better.  She denies any resting chest pressure, neck or arm pain.. She's not having any palpitations, presyncope or syncope. He's not having any PND or orthopnea.  She is active in her job at [employer].   Allergies  Allergen Reactions  . Levaquin [Levofloxacin In D5w] Diarrhea  . Penicillins Rash    Current Outpatient Prescriptions  Medication Sig Dispense Refill  . albuterol (PROVENTIL HFA;VENTOLIN HFA) 108 (90 BASE) MCG/ACT inhaler Inhale 2 puffs into the lungs every 6 (six) hours as needed. 1 Inhaler 6  . amLODipine (NORVASC) 10 MG tablet Take 1 tablet (10 mg total) by mouth daily. 90 tablet 3  . bisoprolol (ZEBETA) 5 MG tablet Take 1 tablet (5 mg total) by mouth daily. 30 tablet 11  . budesonide-formoterol (SYMBICORT) 160-4.5 MCG/ACT inhaler Inhale 2 puffs into the lungs 2 (two) times daily. 1 Inhaler 3  . fluticasone (FLONASE) 50 MCG/ACT nasal spray Place 2 sprays into both nostrils daily. 16 g 1  . ketorolac (ACULAR) 0.5 % ophthalmic solution Place 1 drop into both eyes 4 (four) times daily. 5 mL 1  . losartan (COZAAR) 100 MG tablet Take 100 mg by mouth daily.     . metFORMIN (GLUCOPHAGE) 500 MG tablet Take 1 tablet (500 mg total) by mouth 2 (two) times daily with a meal. 180 tablet 3  . spironolactone (ALDACTONE) 50 MG tablet Take 1 tablet (50 mg total) by mouth daily. 30 tablet 11  . traMADol (ULTRAM) 50 MG tablet Take 50 mg by mouth 3 (three) times daily as needed.    Marland Kitchen glucose blood (ONETOUCH VERIO) test strip Use as instructed 100 each 12  . ONETOUCH DELICA LANCETS FINE MISC Use to check BG once daily 100 each 2   No current facility-administered medications for this visit.    Past  Medical History  Diagnosis Date  . Asthma   . Hypertension   . GERD (gastroesophageal reflux disease)   . History of nephrolithiasis   . Headache(784.0)   . Type II or unspecified type diabetes mellitus without mention of complication, uncontrolled     borderline  . NICM (nonischemic cardiomyopathy)     a. LHC (08/2013):  no CAD  . Chronic systolic heart failure     Echo (08/26/13):  Mild LVH. EF 25% to 30%. Diffuse HK. Aortic valve: There was trivial regurgitation. Mitral valve: There was mild regurgitation. Left atrium: The atrium was mildly dilated.  . Stroke     Past Surgical History  Procedure Laterality Date  . Abdominal hysterectomy    . Oophorectomy      unilateral  . Ureteral extraction of kidney stone    . Left and right heart catheterization with coronary angiogram N/A 09/03/2013    Procedure: LEFT AND RIGHT HEART CATHETERIZATION WITH CORONARY ANGIOGRAM;  Surgeon: [name], MD;  Location: [facility] CATH LAB;  Service: Cardiovascular;  Laterality: N/A;    ROS:  Otherwise as stated in the HPI and negative for all other systems.  PHYSICAL EXAM BP 122/70 mmHg  Pulse 72  Ht 5\' 4"  (1.626 m)  Wt 178 lb (80.74 kg)  BMI 30.54 kg/m2 GENERAL:  Well appearing NECK:  Positive jugular  venous distention at 6 cm, waveform within normal limits, carotid upstroke brisk and symmetric, no bruits, no thyromegaly LUNGS:  Clear to auscultation bilaterally HEART:  PMI not displaced or sustained,S1 and S2 within normal limits, no S3, no S4, no clicks, no rubs, no murmurs ABD:  Flat, positive bowel sounds normal in frequency in pitch, no bruits, no rebound, no guarding, no midline pulsatile mass, no hepatomegaly, no splenomegaly SKIN:  No rashes no nodules NEURO:  Left facial droop, motor grossly intact throughout  EKG:  Sinus rhythm, rate 72, left axis deviation, left anterior fascicular block, RSR prime V1 and 2, premature ventricular contractions, QTC prolonged, nonspecific T-wave  flattening.  07/08/2014  ASSESSMENT AND PLAN  CARDIOMYOPATHY:    Her ejection fraction was slightly improved at the last visit.. She's not as symptomatic as she was. For now she will continue the meds as listed.   HTN:  This is being managed in the context of treating his CHF.  She will remain in the meds as listed.

## 2014-07-08 NOTE — Patient Instructions (Signed)
The current medical regimen is effective;  continue present plan and medications.  Follow up in 6 months with Dr. Percival Spanish in [place].  You will receive a letter in the mail 2 months before you are due.  Please call us when you receive this letter to schedule your follow up appointment.  Thank you for choosing [facility]!!

## 2014-07-17 ENCOUNTER — Encounter: Payer: Self-pay | Admitting: *Deleted

## 2014-07-24 ENCOUNTER — Other Ambulatory Visit: Payer: Self-pay | Admitting: *Deleted

## 2014-07-24 ENCOUNTER — Telehealth: Payer: Self-pay | Admitting: Family Medicine

## 2014-07-24 ENCOUNTER — Other Ambulatory Visit (INDEPENDENT_AMBULATORY_CARE_PROVIDER_SITE_OTHER): Payer: Medicare Other

## 2014-07-24 DIAGNOSIS — R319 Hematuria, unspecified: Secondary | ICD-10-CM

## 2014-07-24 LAB — POCT URINALYSIS DIPSTICK
Bilirubin, UA: NEGATIVE
Glucose, UA: NEGATIVE
Ketones, UA: NEGATIVE
Nitrite, UA: NEGATIVE
Spec Grav, UA: >=1.03
Urobilinogen, UA: NEGATIVE
pH, UA: 6

## 2014-07-24 LAB — POCT UA - MICROSCOPIC ONLY
Casts, Ur, LPF, POC: NEGATIVE
Crystals, Ur, HPF, POC: NEGATIVE
Yeast, UA: NEGATIVE

## 2014-07-24 MED ORDER — SULFAMETHOXAZOLE-TRIMETHOPRIM 800-160 MG PO TABS: 1.0000 | ORAL_TABLET | Freq: Two times a day (BID) | ORAL | Status: DC

## 2014-07-24 NOTE — Telephone Encounter (Signed)
Patient complains of blood in urine.  Provider will review results of urine specimen and treat if necessary.

## 2014-07-24 NOTE — Progress Notes (Signed)
Lab only 

## 2014-07-26 LAB — URINE CULTURE: Organism ID, Bacteria: NO GROWTH

## 2014-07-27 NOTE — Progress Notes (Signed)
Patient aware.

## 2014-07-29 ENCOUNTER — Telehealth: Payer: Self-pay | Admitting: Family Medicine

## 2014-07-29 ENCOUNTER — Ambulatory Visit (INDEPENDENT_AMBULATORY_CARE_PROVIDER_SITE_OTHER): Payer: Medicare Other | Admitting: Pharmacist

## 2014-07-29 ENCOUNTER — Telehealth: Payer: Self-pay | Admitting: Pharmacist

## 2014-07-29 ENCOUNTER — Other Ambulatory Visit: Payer: Self-pay | Admitting: Family Medicine

## 2014-07-29 ENCOUNTER — Encounter: Payer: Self-pay | Admitting: Pharmacist

## 2014-07-29 ENCOUNTER — Ambulatory Visit (INDEPENDENT_AMBULATORY_CARE_PROVIDER_SITE_OTHER): Payer: Medicare Other

## 2014-07-29 VITALS — Ht 62.0 in | Wt 178.0 lb

## 2014-07-29 DIAGNOSIS — Z780 Asymptomatic menopausal state: Secondary | ICD-10-CM

## 2014-07-29 DIAGNOSIS — M8580 Other specified disorders of bone density and structure, unspecified site: Secondary | ICD-10-CM | POA: Diagnosis not present

## 2014-07-29 DIAGNOSIS — E119 Type 2 diabetes mellitus without complications: Secondary | ICD-10-CM | POA: Diagnosis not present

## 2014-07-29 DIAGNOSIS — R7989 Other specified abnormal findings of blood chemistry: Secondary | ICD-10-CM

## 2014-07-29 DIAGNOSIS — E1165 Type 2 diabetes mellitus with hyperglycemia: Secondary | ICD-10-CM

## 2014-07-29 DIAGNOSIS — Z13820 Encounter for screening for osteoporosis: Secondary | ICD-10-CM

## 2014-07-29 DIAGNOSIS — M899 Disorder of bone, unspecified: Secondary | ICD-10-CM | POA: Diagnosis not present

## 2014-07-29 LAB — POCT GLYCOSYLATED HEMOGLOBIN (HGB A1C): Hemoglobin A1C: 6.3

## 2014-07-29 MED ORDER — ALENDRONATE SODIUM 35 MG PO TABS: 35.0000 mg | ORAL_TABLET | ORAL | Status: DC

## 2014-07-29 MED ORDER — CALCIUM CARBONATE-VITAMIN D 600-400 MG-UNIT PO TABS: 1.0000 | ORAL_TABLET | Freq: Every day | ORAL | Status: DC

## 2014-07-29 MED ORDER — RISEDRONATE SODIUM 150 MG PO TABS: 150.0000 mg | ORAL_TABLET | ORAL | Status: DC

## 2014-07-29 NOTE — Telephone Encounter (Signed)
Changed to alendronate 35mg  1 tablet weekly.  Patinet notified and rx sent to [place]

## 2014-07-29 NOTE — Patient Instructions (Signed)
Try AZO standard for urinary pain for 2 days.  If no relief then call office.  Continue to take antibiotic prescribed for urinary tract infection.   Fall Prevention and Home Safety Falls cause injuries and can affect all age groups. It is possible to use preventive measures to significantly decrease the likelihood of falls. There are many simple measures which can make your home safer and prevent falls. OUTDOORS  Repair cracks and edges of walkways and driveways.  Remove high doorway thresholds.  Trim shrubbery on the main path into your home.  Have good outside lighting.  Clear walkways of tools, rocks, debris, and clutter.  Check that handrails are not broken and are securely fastened. Both sides of steps should have handrails.  Have leaves, snow, and ice cleared regularly.  Use sand or salt on walkways during winter months.  In the garage, clean up grease or oil spills. BATHROOM  Install night lights.  Install grab bars by the toilet and in the tub and shower.  Use non-skid mats or decals in the tub or shower.  Place a plastic non-slip stool in the shower to sit on, if needed.  Keep floors dry and clean up all water on the floor immediately.  Remove soap buildup in the tub or shower on a regular basis.  Secure bath mats with non-slip, double-sided rug tape.  Remove throw rugs and tripping hazards from the floors. BEDROOMS  Install night lights.  Make sure a bedside light is easy to reach.  Do not use oversized bedding.  Keep a telephone by your bedside.  Have a firm chair with side arms to use for getting dressed.  Remove throw rugs and tripping hazards from the floor. KITCHEN  Keep handles on pots and pans turned toward the center of the stove. Use back burners when possible.  Clean up spills quickly and allow time for drying.  Avoid walking on wet floors.  Avoid hot utensils and knives.  Position shelves so they are not too high or low.  Place  commonly used objects within easy reach.  If necessary, use a sturdy step stool with a grab bar when reaching.  Keep electrical cables out of the way.  Do not use floor polish or wax that makes floors slippery. If you must use wax, use non-skid floor wax.  Remove throw rugs and tripping hazards from the floor. STAIRWAYS  Never leave objects on stairs.  Place handrails on both sides of stairways and use them. Fix any loose handrails. Make sure handrails on both sides of the stairways are as long as the stairs.  Check carpeting to make sure it is firmly attached along stairs. Make repairs to worn or loose carpet promptly.  Avoid placing throw rugs at the top or bottom of stairways, or properly secure the rug with carpet tape to prevent slippage. Get rid of throw rugs, if possible.  Have an electrician put in a light switch at the top and bottom of the stairs. OTHER FALL PREVENTION TIPS  Wear low-heel or rubber-soled shoes that are supportive and fit well. Wear closed toe shoes.  When using a stepladder, make sure it is fully opened and both spreaders are firmly locked. Do not climb a closed stepladder.  Add color or contrast paint or tape to grab bars and handrails in your home. Place contrasting color strips on first and last steps.  Learn and use mobility aids as needed. Install an electrical emergency response system.  Turn on lights to  avoid dark areas. Replace light bulbs that burn out immediately. Get light switches that glow.  Arrange furniture to create clear pathways. Keep furniture in the same place.  Firmly attach carpet with non-skid or double-sided tape.  Eliminate uneven floor surfaces.  Select a carpet pattern that does not visually hide the edge of steps.  Be aware of all pets. OTHER HOME SAFETY TIPS  Set the water temperature for 120 F (48.8 C).  Keep emergency numbers on or near the telephone.  Keep smoke detectors on every level of the home and near  sleeping areas. Document Released: 03/17/2002 Document Revised: 09/26/2011 Document Reviewed: 06/16/2011 [facility] Patient Information 2015 [place], [place]. This information is not intended to replace advice given to you by your health care provider. Make sure you discuss any questions you have with your health care provider.               Exercise for Strong Bones  Exercise is important to build and maintain strong bones / bone density.  There are 2 types of exercises that are important to building and maintaining strong bones:  Weight- bearing and muscle-stregthening.  Weight-bearing Exercises  These exercises include activities that make you move against gravity while staying upright. Weight-bearing exercises can be high-impact or low-impact.  High-impact weight-bearing exercises help build bones and keep them strong. If you have broken a bone due to osteoporosis or are at risk of breaking a bone, you may need to avoid high-impact exercises. If you're not sure, you should check with your healthcare provider.  Examples of high-impact weight-bearing exercises are: Dancing  Doing high-impact aerobics  Hiking  Jogging/running  Jumping Rope  Stair climbing  Tennis  Low-impact weight-bearing exercises can also help keep bones strong and are a safe alternative if you cannot do high-impact exercises.   Examples of low-impact weight-bearing exercises are: Using elliptical training machines  Doing low-impact aerobics  Using stair-step machines  Fast walking on a treadmill or outside   Muscle-Strengthening Exercises These exercises include activities where you move your body, a weight or some other resistance against gravity. They are also known as resistance exercises and include: Lifting weights  Using elastic exercise bands  Using weight machines  Lifting your own body weight  Functional movements, such as standing and rising up on your toes  Yoga and Pilates can also improve  strength, balance and flexibility. However, certain positions may not be safe for people with osteoporosis or those at increased risk of broken bones. For example, exercises that have you bend forward may increase the chance of breaking a bone in the spine.   Non-Impact Exercises There are other types of exercises that can help prevent falls.  Non-impact exercises can help you to improve balance, posture and how well you move in everyday activities. Some of these exercises include: Balance exercises that strengthen your legs and test your balance, such as Tai Chi, can decrease your risk of falls.  Posture exercises that improve your posture and reduce rounded or "sloping" shoulders can help you decrease the chance of breaking a bone, especially in the spine.  Functional exercises that improve how well you move can help you with everyday activities and decrease your chance of falling and breaking a bone. For example, if you have trouble getting up from a chair or climbing stairs, you should do these activities as exercises.   **A physical therapist can teach you balance, posture and functional exercises. He/she can also help you learn which exercises  are safe and appropriate for you.  [facility] has a physical therapy office in [place] in front of our office and referrals can be made for assessments and treatment as needed and strength and balance training.  If you would like to have an assessment with [place] and our physical therapy team please let a nurse or provider know.
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
## 2014-07-30 DIAGNOSIS — E559 Vitamin D deficiency, unspecified: Secondary | ICD-10-CM | POA: Insufficient documentation

## 2014-07-30 DIAGNOSIS — M8580 Other specified disorders of bone density and structure, unspecified site: Secondary | ICD-10-CM | POA: Insufficient documentation

## 2014-07-30 LAB — BMP8+EGFR
BUN/Creatinine Ratio: 23 (ref 11–26)
BUN: 23 mg/dL (ref 8–27)
CO2: 21 mmol/L (ref 18–29)
Calcium: 9.5 mg/dL (ref 8.7–10.3)
Chloride: 108 mmol/L (ref 97–108)
Creatinine, Ser: 1 mg/dL (ref 0.57–1.00)
GFR calc Af Amer: 66 mL/min/{1.73_m2} (ref >59)
GFR calc non Af Amer: 58 mL/min/{1.73_m2} — ABNORMAL LOW (ref >59)
Glucose: 129 mg/dL — ABNORMAL HIGH (ref 65–99)
Potassium: 4.8 mmol/L (ref 3.5–5.2)
Sodium: 143 mmol/L (ref 134–144)

## 2014-07-30 LAB — VITAMIN D 25 HYDROXY (VIT D DEFICIENCY, FRACTURES): Vit D, 25-Hydroxy: 16.3 ng/mL — ABNORMAL LOW (ref 30.0–100.0)

## 2014-07-30 LAB — LIPID PANEL
Chol/HDL Ratio: 4.2 ratio units (ref 0.0–4.4)
Cholesterol, Total: 162 mg/dL (ref 100–199)
HDL: 39 mg/dL — ABNORMAL LOW (ref >39)
LDL Calculated: 89 mg/dL (ref 0–99)
Triglycerides: 171 mg/dL — ABNORMAL HIGH (ref 0–149)
VLDL Cholesterol Cal: 34 mg/dL (ref 5–40)

## 2014-07-30 LAB — THYROID PANEL WITH TSH
Free Thyroxine Index: 2 (ref 1.2–4.9)
T3 Uptake Ratio: 26 % (ref 24–39)
T4, Total: 7.7 ug/dL (ref 4.5–12.0)
TSH: 2.53 u[IU]/mL (ref 0.450–4.500)

## 2014-07-30 NOTE — Telephone Encounter (Signed)
Patient notified of lab results.  Instructed to start viatmin D 50,000IU once weekly - rx sent in  Also made appt to f/u with PCP in 3 months for labs and chronic conditions.

## 2014-09-24 DIAGNOSIS — G510 Bell's palsy: Secondary | ICD-10-CM | POA: Diagnosis not present

## 2014-09-24 DIAGNOSIS — L908 Other atrophic disorders of skin: Secondary | ICD-10-CM | POA: Diagnosis not present

## 2014-09-24 DIAGNOSIS — G518 Other disorders of facial nerve: Secondary | ICD-10-CM | POA: Diagnosis not present

## 2014-09-24 DIAGNOSIS — H02839 Dermatochalasis of unspecified eye, unspecified eyelid: Secondary | ICD-10-CM | POA: Diagnosis not present

## 2014-09-24 DIAGNOSIS — H02403 Unspecified ptosis of bilateral eyelids: Secondary | ICD-10-CM | POA: Diagnosis not present

## 2014-09-24 DIAGNOSIS — H02831 Dermatochalasis of right upper eyelid: Secondary | ICD-10-CM | POA: Diagnosis not present

## 2014-09-24 DIAGNOSIS — H04123 Dry eye syndrome of bilateral lacrimal glands: Secondary | ICD-10-CM | POA: Diagnosis not present

## 2014-10-13 ENCOUNTER — Other Ambulatory Visit: Payer: Self-pay | Admitting: Cardiology

## 2014-10-15 ENCOUNTER — Other Ambulatory Visit: Payer: Self-pay | Admitting: *Deleted

## 2014-10-15 DIAGNOSIS — I10 Essential (primary) hypertension: Secondary | ICD-10-CM

## 2014-10-15 MED ORDER — SPIRONOLACTONE 50 MG PO TABS: 50.0000 mg | ORAL_TABLET | Freq: Every day | ORAL | Status: DC

## 2014-10-26 ENCOUNTER — Other Ambulatory Visit: Payer: Self-pay | Admitting: Cardiovascular Disease

## 2014-11-03 ENCOUNTER — Ambulatory Visit: Payer: Self-pay | Admitting: Family Medicine

## 2014-12-30 ENCOUNTER — Encounter: Payer: Self-pay | Admitting: Cardiology

## 2014-12-30 ENCOUNTER — Ambulatory Visit (INDEPENDENT_AMBULATORY_CARE_PROVIDER_SITE_OTHER): Payer: Medicare Other | Admitting: Cardiology

## 2014-12-30 VITALS — BP 120/70 | HR 96 | Ht 64.0 in | Wt 181.0 lb

## 2014-12-30 DIAGNOSIS — I10 Essential (primary) hypertension: Secondary | ICD-10-CM | POA: Diagnosis not present

## 2014-12-30 MED ORDER — FUROSEMIDE 20 MG PO TABS: 20.0000 mg | ORAL_TABLET | ORAL | Status: DC | PRN

## 2014-12-30 MED ORDER — BISOPROLOL FUMARATE 10 MG PO TABS: 10.0000 mg | ORAL_TABLET | Freq: Every day | ORAL | Status: DC

## 2014-12-30 MED ORDER — AMLODIPINE BESYLATE 5 MG PO TABS: 5.0000 mg | ORAL_TABLET | Freq: Every day | ORAL | Status: DC

## 2014-12-30 NOTE — Progress Notes (Signed)
HPI  The patient presents for follow up of dyspnea.  Was previously found to have an EF of 25%.  Her last EF is about 35% perhaps slightly higher than reported previously. Unfortunately since I last saw her her 70 year old son died suddenly of unknown causes. She has been having a tough time grieving by herself.  She has had a little bit of swelling and increased weight.  She's probably been having a little more salt. She's not describing new PND or orthopnea. She's not having any chest pressure, neck or arm discomfort area she's not having any new palpitations, presyncope or syncope. Of note she has been out of her bisoprolol.  Allergies  Allergen Reactions  . Levaquin [Levofloxacin In D5w] Diarrhea  . Penicillins Rash    Current Outpatient Prescriptions  Medication Sig Dispense Refill  . albuterol (PROVENTIL HFA;VENTOLIN HFA) 108 (90 BASE) MCG/ACT inhaler Inhale 2 puffs into the lungs every 6 (six) hours as needed. 1 Inhaler 6  . amLODipine (NORVASC) 10 MG tablet Take 1 tablet (10 mg total) by mouth daily. 90 tablet 3  . aspirin 325 MG tablet Take 325 mg by mouth daily.    Marland Kitchen losartan (COZAAR) 100 MG tablet TAKE ONE TABLET BY MOUTH ONCE DAILY 30 tablet 3  . metFORMIN (GLUCOPHAGE) 500 MG tablet Take 1 tablet (500 mg total) by mouth 2 (two) times daily with a meal. 180 tablet 3  . spironolactone (ALDACTONE) 50 MG tablet Take 1 tablet (50 mg total) by mouth daily. 30 tablet 6  . alendronate (FOSAMAX) 35 MG tablet Take 1 tablet (35 mg total) by mouth every 7 (seven) days. Take with a full glass of water on an empty stomach. (Patient not taking: Reported on 12/30/2014) 12 tablet 3  . bisoprolol (ZEBETA) 5 MG tablet Take 1 tablet (5 mg total) by mouth daily. (Patient not taking: Reported on 12/30/2014) 30 tablet 11  . glucose blood (ONETOUCH VERIO) test strip Use as instructed 100 each 12  . ONETOUCH DELICA LANCETS FINE MISC Use to check BG once daily 100 each 2  . traMADol (ULTRAM) 50 MG tablet  Take 50 mg by mouth 3 (three) times daily as needed.     No current facility-administered medications for this visit.    Past Medical History  Diagnosis Date  . Asthma   . Hypertension   . GERD (gastroesophageal reflux disease)   . History of nephrolithiasis   . Headache(784.0)   . Type II or unspecified type diabetes mellitus without mention of complication, uncontrolled     borderline  . NICM (nonischemic cardiomyopathy)     a. LHC (08/2013):  no CAD  . Chronic systolic heart failure     Echo (08/26/13):  Mild LVH. EF 25% to 30%. Diffuse HK. Aortic valve: There was trivial regurgitation. Mitral valve: There was mild regurgitation. Left atrium: The atrium was mildly dilated.  . Stroke     Past Surgical History  Procedure Laterality Date  . Abdominal hysterectomy    . Oophorectomy      removed 1 ovary and 1 ovary remains.  . Ureteral extraction of kidney stone    . Left and right heart catheterization with coronary angiogram N/A 09/03/2013    Procedure: LEFT AND RIGHT HEART CATHETERIZATION WITH CORONARY ANGIOGRAM;  Surgeon: [name], MD;  Location: [facility] CATH LAB;  Service: Cardiovascular;  Laterality: N/A;    ROS:  Otherwise as stated in the HPI and negative for all other systems.  PHYSICAL EXAM BP  120/70 mmHg  Pulse 96  Ht 5\' 4"  (1.626 m)  Wt 181 lb (82.101 kg)  BMI 31.05 kg/m2 GENERAL:  Well appearing NECK:  Positive jugular venous distention at 6 cm, waveform within normal limits, carotid upstroke brisk and symmetric, no bruits, no thyromegaly LUNGS:  Clear to auscultation bilaterally HEART:  PMI not displaced or sustained,S1 and S2 within normal limits, no S3, no S4, no clicks, no rubs, 2 out of 6 brief apical systolic murmur radiating out the aortic outflow tract, no diastolic murmurs ABD:  Flat, positive bowel sounds normal in frequency in pitch, no bruits, no rebound, no guarding, no midline pulsatile mass, no hepatomegaly, no splenomegaly SKIN:  No rashes  no nodules EXT:  2+ pulses throughout, mild bilateral lower extremity edema NEURO:  Left facial droop, motor grossly intact throughout  EKG:  Sinus rhythm, rate 96, left axis deviation, left anterior fascicular block, RSR prime V1 and 2, premature ventricular contractions, QTC prolonged, nonspecific T-wave flattening.  12/30/2014  ASSESSMENT AND PLAN  CARDIOMYOPATHY:    I'm going to increase her bisoprolol 10 mg. If this worsens her asthma we will need to back off. I'm going to reduce her Norvasc. She's had a take 2 days worth of Lasix 20 mg and start significant salt restriction. She will let me know if her breathing gets worse.  HTN:  This is being managed in the context of treating his CHF. The meds will be changed as above.  GRIEF:  We are trying to get her grief counseling and I have suggested that she see Dr. Laurance Flatten for management of this.

## 2014-12-30 NOTE — Patient Instructions (Addendum)
Medication Instructions:  Please take Furosemide 20 mg for 2 days. Please increase your Bisoprolol to 10 mg a day. Decrease your Amlodipine to 5 mg a day. Continue all other medications as listed.  Please decrease the amount of sodium in your diet.  Please call to follow up with Dr Laurance Flatten as requested.  Follow-Up:   Marja Kays  Location [facility] [address] [place]. [place], [place] [address] [phone]

## 2015-01-05 ENCOUNTER — Telehealth: Payer: Self-pay | Admitting: *Deleted

## 2015-01-05 NOTE — Telephone Encounter (Signed)
Left message that we received message from Dr [facility] office that patient needs a follow up appt for grief counseling. Advised patient to call back for appt

## 2015-01-07 ENCOUNTER — Telehealth: Payer: Self-pay | Admitting: Family Medicine

## 2015-01-08 NOTE — Telephone Encounter (Signed)
Appt has been made for next Wed w/ Dr. Laurance Flatten

## 2015-01-08 NOTE — Telephone Encounter (Signed)
[email or web address]

## 2015-01-13 ENCOUNTER — Encounter: Payer: Self-pay | Admitting: Family Medicine

## 2015-01-13 ENCOUNTER — Ambulatory Visit (INDEPENDENT_AMBULATORY_CARE_PROVIDER_SITE_OTHER): Payer: Medicare Other | Admitting: Family Medicine

## 2015-01-13 VITALS — BP 168/75 | HR 68 | Temp 98.5°F | Ht 64.0 in | Wt 184.0 lb

## 2015-01-13 DIAGNOSIS — Z634 Disappearance and death of family member: Secondary | ICD-10-CM

## 2015-01-13 DIAGNOSIS — F329 Major depressive disorder, single episode, unspecified: Secondary | ICD-10-CM

## 2015-01-13 DIAGNOSIS — F4321 Adjustment disorder with depressed mood: Secondary | ICD-10-CM

## 2015-01-13 DIAGNOSIS — Z23 Encounter for immunization: Secondary | ICD-10-CM | POA: Diagnosis not present

## 2015-01-13 DIAGNOSIS — F32A Depression, unspecified: Secondary | ICD-10-CM

## 2015-01-13 MED ORDER — ESCITALOPRAM OXALATE 10 MG PO TABS
10.0000 mg | ORAL_TABLET | Freq: Every day | ORAL | Status: DC
Start: 2015-01-13 — End: 2015-12-30

## 2015-01-13 NOTE — Progress Notes (Signed)
Subjective:    Patient ID: [name], female    DOB: 20-Jul-1945, 69 y.o.   MRN: [id number]  HPI Patient here today for depression and anxiety related to the loss of her son on 12/07/14. Please see the depression screening  Questionnaire. The patient's son died suddenly of natural causes. He was 70 years old. He did have a hypertension and did smoke. She does have a daughter and she is divorced from her husband. She is having a lot of trouble dealing with this, insomnia anxiety during the day and just needs some direction at where to go in and out ago. She does have a grandson by her son. She also has her daughter at home with her.    Patient Active Problem List   Diagnosis Date Noted  . Low serum vitamin D 07/30/2014  . Osteopenia with high risk of fracture 07/30/2014  . Left-sided Bell's palsy 01/16/2014  . CVA (cerebral infarction) 12/16/2013  . NICM (nonischemic cardiomyopathy), Echo 08/26/13-EF 25-30%  09/17/2013  . Chronic systolic heart failure ([place]) 09/17/2013  . Acute bronchitis 03/03/2011  . Wheezing 03/03/2011  . Uncontrolled type 2 diabetes mellitus ([place]) 05/05/2010  . TRANSIENT ISCHEMIC ATTACK 05/05/2010  . Essential hypertension 11/12/2007  . ASTHMA 11/12/2007  . GERD 11/12/2007  . Headache(784.0) 11/12/2007  . NEPHROLITHIASIS, HX OF 11/12/2007   Outpatient Encounter Prescriptions as of 01/13/2015  Medication Sig  . albuterol (PROVENTIL HFA;VENTOLIN HFA) 108 (90 BASE) MCG/ACT inhaler Inhale 2 puffs into the lungs every 6 (six) hours as needed.  Marland Kitchen amLODipine (NORVASC) 5 MG tablet Take 1 tablet (5 mg total) by mouth daily.  Marland Kitchen aspirin 325 MG tablet Take 325 mg by mouth daily.  . bisoprolol (ZEBETA) 10 MG tablet Take 1 tablet (10 mg total) by mouth daily.  . furosemide (LASIX) 20 MG tablet Take 1 tablet (20 mg total) by mouth as needed. Take one tablet for two days. Hold other tablets for further use as directed  . glucose blood (ONETOUCH VERIO) test strip Use as  instructed  . losartan (COZAAR) 100 MG tablet TAKE ONE TABLET BY MOUTH ONCE DAILY  . metFORMIN (GLUCOPHAGE) 500 MG tablet Take 1 tablet (500 mg total) by mouth 2 (two) times daily with a meal.  . ONETOUCH DELICA LANCETS FINE MISC Use to check BG once daily  . spironolactone (ALDACTONE) 50 MG tablet Take 1 tablet (50 mg total) by mouth daily.  . traMADol (ULTRAM) 50 MG tablet Take 50 mg by mouth 3 (three) times daily as needed.  . [DISCONTINUED] alendronate (FOSAMAX) 35 MG tablet Take 1 tablet (35 mg total) by mouth every 7 (seven) days. Take with a full glass of water on an empty stomach. (Patient not taking: Reported on 12/30/2014)   No facility-administered encounter medications on file as of 01/13/2015.      Review of Systems  Constitutional: Negative.   HENT: Negative.   Eyes: Negative.   Respiratory: Negative.   Cardiovascular: Negative.   Gastrointestinal: Negative.   Endocrine: Negative.   Genitourinary: Negative.   Musculoskeletal: Negative.   Skin: Negative.   Allergic/Immunologic: Negative.   Neurological: Negative.   Hematological: Negative.   Psychiatric/Behavioral: The patient is nervous/anxious (depression).        Objective:   Physical Exam  BP 168/75 mmHg  Pulse 68  Temp(Src) 98.5 F (36.9 C) (Oral)  Ht 5\' 4"  (1.626 m)  Wt 184 lb (83.462 kg)  BMI 31.57 kg/m2  We had a long discussion about what has  happened and how she's feeling and the insomnia and restlessness that she is experiencing during the day and not being able to understand why this is happening and trying to get it off of her mind. Her son was found dead and his cause of death was natural causes. She was reassured and I told her that all of her reactions were normal at this point in time she agreed that grief counseling might be helpful for her. We discussed starting an antidepressant like Lexapro and she agreed to trying this. We also agreed to watch the wine intake is that sometimes this can make  people have insomnia at nighttime. She says she does not drink caffeine but early in the morning.     Assessment & Plan:  1. Encounter for immunization -Flu shot given today  2. Grief at loss of child -Start Lexapro 10 mg one half pill daily for 4 days then 1 daily -Try to set up appointment with psychologist to help with the patient's grief  3. Depression -Start Lexapro as directed -The patient return to clinic in 4 weeks for follow-up  Meds ordered this encounter  Medications  . escitalopram (LEXAPRO) 10 MG tablet    Sig: Take 1 tablet (10 mg total) by mouth daily.    Dispense:  30 tablet    Refill:  3   Patient Instructions  We will start the Lexapro 10 mg one half daily for 4 days then 1 daily Decrease the wine intake Continue to avoid caffeine after lunch every day Get out and walk and exercise when possible We will arrange for you to see a psychologist to talk to some more regarding your grief over the loss of your son--Julie Kelton Pillar We will ask you to come back and visit with [place] again in about 4 weeks If you have any problems before that time please get back in touch with [place] sooner        Arrie Senate MD

## 2015-01-13 NOTE — Patient Instructions (Signed)
We will start the Lexapro 10 mg one half daily for 4 days then 1 daily Decrease the wine intake Continue to avoid caffeine after lunch every day Get out and walk and exercise when possible We will arrange for you to see a psychologist to talk to some more regarding your grief over the loss of your son--Julie Whitt We will ask you to come back and visit with [place] again in about 4 weeks If you have any problems before that time please get back in touch with [place] sooner

## 2015-02-01 ENCOUNTER — Ambulatory Visit (INDEPENDENT_AMBULATORY_CARE_PROVIDER_SITE_OTHER): Payer: 59 | Admitting: Licensed Clinical Social Worker

## 2015-02-01 DIAGNOSIS — F321 Major depressive disorder, single episode, moderate: Secondary | ICD-10-CM | POA: Diagnosis not present

## 2015-02-09 ENCOUNTER — Encounter: Payer: Self-pay | Admitting: Family Medicine

## 2015-02-09 ENCOUNTER — Ambulatory Visit (INDEPENDENT_AMBULATORY_CARE_PROVIDER_SITE_OTHER): Payer: Medicare Other | Admitting: Family Medicine

## 2015-02-09 VITALS — BP 143/64 | HR 63 | Temp 97.2°F | Ht 64.0 in | Wt 177.0 lb

## 2015-02-09 DIAGNOSIS — I517 Cardiomegaly: Secondary | ICD-10-CM

## 2015-02-09 DIAGNOSIS — Z0000 Encounter for general adult medical examination without abnormal findings: Secondary | ICD-10-CM

## 2015-02-09 DIAGNOSIS — F4321 Adjustment disorder with depressed mood: Secondary | ICD-10-CM

## 2015-02-09 DIAGNOSIS — Z634 Disappearance and death of family member: Secondary | ICD-10-CM

## 2015-02-09 DIAGNOSIS — E1165 Type 2 diabetes mellitus with hyperglycemia: Secondary | ICD-10-CM

## 2015-02-09 DIAGNOSIS — R7989 Other specified abnormal findings of blood chemistry: Secondary | ICD-10-CM

## 2015-02-09 NOTE — Progress Notes (Signed)
Subjective:    Patient ID: [name], female    DOB: [birth year], 69 y.o.   MRN: [id number]  HPI Patient here today for 1 month follow up on anxiety and depression. This patient is followed by the cardiologist regularly because of her heart condition. She has a history of chronic systolic heart failure and a cardiomyopathy. She also has a history of stroke in the past. She's been followed fairly regularly by the clinical pharmacists in our office also. She has seen the psychologist and likes her. She will be seeing her again 7. She is taking Lexapro 10 mg and this may be helping some. She's had some increased nausea. She drinks 2 cups of coffee daily. She has not seen any blood in her stool.      Patient Active Problem List   Diagnosis Date Noted  . Low serum vitamin D 07/30/2014  . Osteopenia with high risk of fracture 07/30/2014  . Left-sided Bell's palsy 01/16/2014  . CVA (cerebral infarction) 12/16/2013  . NICM (nonischemic cardiomyopathy), Echo 08/26/13-EF 25-30%  09/17/2013  . Chronic systolic heart failure ([place]) 09/17/2013  . Acute bronchitis 03/03/2011  . Wheezing 03/03/2011  . Uncontrolled type 2 diabetes mellitus ([place]) 05/05/2010  . TRANSIENT ISCHEMIC ATTACK 05/05/2010  . Essential hypertension 11/12/2007  . ASTHMA 11/12/2007  . GERD 11/12/2007  . Headache(784.0) 11/12/2007  . NEPHROLITHIASIS, HX OF 11/12/2007   Outpatient Encounter Prescriptions as of 02/09/2015  Medication Sig  . albuterol (PROVENTIL HFA;VENTOLIN HFA) 108 (90 BASE) MCG/ACT inhaler Inhale 2 puffs into the lungs every 6 (six) hours as needed.  Marland Kitchen amLODipine (NORVASC) 5 MG tablet Take 1 tablet (5 mg total) by mouth daily.  Marland Kitchen aspirin 325 MG tablet Take 325 mg by mouth daily.  . bisoprolol (ZEBETA) 10 MG tablet Take 1 tablet (10 mg total) by mouth daily.  Marland Kitchen escitalopram (LEXAPRO) 10 MG tablet Take 1 tablet (10 mg total) by mouth daily.  . furosemide (LASIX) 20 MG tablet Take 1 tablet (20 mg total) by  mouth as needed. Take one tablet for two days. Hold other tablets for further use as directed  . glucose blood (ONETOUCH VERIO) test strip Use as instructed  . losartan (COZAAR) 100 MG tablet TAKE ONE TABLET BY MOUTH ONCE DAILY  . metFORMIN (GLUCOPHAGE) 500 MG tablet Take 1 tablet (500 mg total) by mouth 2 (two) times daily with a meal.  . ONETOUCH DELICA LANCETS FINE MISC Use to check BG once daily  . spironolactone (ALDACTONE) 50 MG tablet Take 1 tablet (50 mg total) by mouth daily.  . traMADol (ULTRAM) 50 MG tablet Take 50 mg by mouth 3 (three) times daily as needed.   No facility-administered encounter medications on file as of 02/09/2015.      Review of Systems  Constitutional: Negative.   HENT: Negative.   Eyes: Negative.   Respiratory: Negative.   Cardiovascular: Negative.   Gastrointestinal: Negative.   Endocrine: Negative.   Genitourinary: Negative.   Musculoskeletal: Negative.   Skin: Negative.   Allergic/Immunologic: Negative.   Neurological: Negative.   Hematological: Negative.   Psychiatric/Behavioral: Negative.        Anxiety and depression       Objective:   Physical Exam  Constitutional: She is oriented to person, place, and time. She appears well-developed and well-nourished. No distress.  The patient is pleasant and still distressed about her son but seems to be less so this time than when I saw her initially about 4 weeks ago.  She says that she is doing some better. She understands that she has a long road ahead of her with the holiday season coming up.  HENT:  Head: Normocephalic and atraumatic.  Eyes: Conjunctivae and EOM are normal. Pupils are equal, round, and reactive to light. Right eye exhibits no discharge. Left eye exhibits no discharge. No scleral icterus.  Neck: Normal range of motion. Neck supple. No thyromegaly present.  Bilateral carotid bruits  Cardiovascular: Normal rate, regular rhythm, normal heart sounds and intact distal pulses.   No  murmur heard. The heart is regular at 72/m  Pulmonary/Chest: Effort normal and breath sounds normal. No respiratory distress. She has no wheezes. She has no rales. She exhibits no tenderness.  Abdominal: Soft. Bowel sounds are normal. She exhibits no mass. There is no tenderness. There is no rebound and no guarding.  No specific epigastric tenderness  Musculoskeletal: Normal range of motion. She exhibits no edema.  Lymphadenopathy:    She has no cervical adenopathy.  Neurological: She is alert and oriented to person, place, and time. She has normal reflexes. No cranial nerve deficit.  Skin: Skin is warm and dry. No rash noted.  Psychiatric: She has a normal mood and affect. Her behavior is normal. Judgment and thought content normal.  Still very sad but improved  Nursing note and vitals reviewed.  BP 143/64 mmHg  Pulse 63  Temp(Src) 97.2 F (36.2 C) (Oral)  Ht 5' 4"  (1.626 m)  Wt 177 lb (80.287 kg)  BMI 30.37 kg/m2        Assessment & Plan:  1. Low serum vitamin D - CBC with Differential/Platelet; Future - Vit D  25 hydroxy (rtn osteoporosis monitoring); Future  2. Type 2 diabetes mellitus with hyperglycemia, without long-term current use of insulin (HCC) -Monitor blood sugars and bring these readings for review to clinical pharmacy and to the provider at the next visit - BMP8+EGFR; Future - NMR, lipoprofile; Future - CBC with Differential/Platelet; Future - POCT glycosylated hemoglobin (Hb A1C); Future  3. Health care maintenance -We will get some routine lab work on this patient and share this with her cardiologist - BMP8+EGFR; Future - Hepatic function panel; Future - NMR, lipoprofile; Future - CBC with Differential/Platelet; Future - Thyroid Panel With TSH; Future  4. Grief at loss of child -Continue to follow-up with psychologist -Continue with Lexapro -Try to increase walking and exercise  5. Cardiomegaly -Continue to follow-up with cardiology  Patient  Instructions  Zantac 150 mg - OTC -- you can buy Rudi Rummage brand equate for $4 for #65 pills -- take this Twice a day. The patient should continue to follow-up with the psychologist She should continue to follow-up with the cardiologist She should make a special effort to get out and do more walking, with her neighbor. The exercise will be good for her both for her medical issues and her psychological issues   Arrie Senate MD

## 2015-02-09 NOTE — Patient Instructions (Addendum)
Zantac 150 mg - OTC -- you can buy Rudi Rummage brand equate for $4 for #65 pills -- take this Twice a day. The patient should continue to follow-up with the psychologist She should continue to follow-up with the cardiologist She should make a special effort to get out and do more walking, with her neighbor. The exercise will be good for her both for her medical issues and her psychological issues

## 2015-02-10 ENCOUNTER — Ambulatory Visit: Payer: Medicare Other | Admitting: Cardiology

## 2015-02-18 ENCOUNTER — Ambulatory Visit (INDEPENDENT_AMBULATORY_CARE_PROVIDER_SITE_OTHER): Payer: 59 | Admitting: Licensed Clinical Social Worker

## 2015-02-18 DIAGNOSIS — F321 Major depressive disorder, single episode, moderate: Secondary | ICD-10-CM | POA: Diagnosis not present

## 2015-03-10 ENCOUNTER — Ambulatory Visit: Payer: 59 | Admitting: Licensed Clinical Social Worker

## 2015-03-12 ENCOUNTER — Ambulatory Visit: Payer: Self-pay | Admitting: Pharmacist

## 2015-03-15 ENCOUNTER — Ambulatory Visit: Payer: Medicare Other | Admitting: Family Medicine

## 2015-03-16 ENCOUNTER — Telehealth: Payer: Self-pay | Admitting: Family Medicine

## 2015-03-24 ENCOUNTER — Encounter: Payer: Self-pay | Admitting: Cardiology

## 2015-03-24 ENCOUNTER — Ambulatory Visit (INDEPENDENT_AMBULATORY_CARE_PROVIDER_SITE_OTHER): Payer: Medicare Other | Admitting: Cardiology

## 2015-03-24 VITALS — BP 161/88 | HR 81 | Ht 64.0 in | Wt 178.0 lb

## 2015-03-24 DIAGNOSIS — I5022 Chronic systolic (congestive) heart failure: Secondary | ICD-10-CM | POA: Diagnosis not present

## 2015-03-24 DIAGNOSIS — Z79899 Other long term (current) drug therapy: Secondary | ICD-10-CM | POA: Diagnosis not present

## 2015-03-24 MED ORDER — SACUBITRIL-VALSARTAN 49-51 MG PO TABS: 1.0000 | ORAL_TABLET | Freq: Two times a day (BID) | ORAL | Status: DC

## 2015-03-24 NOTE — Patient Instructions (Addendum)
Medication Instructions:  Please stop your Losartan. Start Entresto 49/51mg  as directed. Continue all other medications as listed.  Please have blood work at discuss after Christmas as [facility].  Follow-Up: Follow up in 6 weeks with Dr Percival Spanish in [place].  If you need a refill on your cardiac medications before your next appointment, please call your pharmacy.  Thank you for choosing [facility]!!

## 2015-03-24 NOTE — Progress Notes (Signed)
HPI  The patient presents for follow up of dyspnea.  Was previously found to have an EF of 25%.  Her last EF is about 35% perhaps slightly higher than reported previously. Unfortunately, as reported in the previous note she's having continued understandable difficulty with the sudden death of her 70 year old son  of unknown causes.  She is not on an antidepressant and is working with grief counseling.  She's had more shortness of breath than previous. She denies new PND or orthopnea. She's had no new swelling. She tries to avoid salt and uses some. She says her dyspnea is with moderate activity and walking in the cold areaShe's not having any chest pressure, neck or arm discomfort area she's not having any new palpitations, presyncope or syncope.   Allergies  Allergen Reactions  . Levaquin [Levofloxacin In D5w] Diarrhea  . Penicillins Rash    Current Outpatient Prescriptions  Medication Sig Dispense Refill  . albuterol (PROVENTIL HFA;VENTOLIN HFA) 108 (90 BASE) MCG/ACT inhaler Inhale 2 puffs into the lungs every 6 (six) hours as needed. 1 Inhaler 6  . amLODipine (NORVASC) 5 MG tablet Take 1 tablet (5 mg total) by mouth daily. 30 tablet 11  . aspirin 325 MG tablet Take 325 mg by mouth daily.    . bisoprolol (ZEBETA) 10 MG tablet Take 1 tablet (10 mg total) by mouth daily. 30 tablet 11  . escitalopram (LEXAPRO) 10 MG tablet Take 1 tablet (10 mg total) by mouth daily. 30 tablet 3  . furosemide (LASIX) 20 MG tablet Take 1 tablet (20 mg total) by mouth as needed. Take one tablet for two days. Hold other tablets for further use as directed 30 tablet 0  . losartan (COZAAR) 100 MG tablet TAKE ONE TABLET BY MOUTH ONCE DAILY 30 tablet 3  . metFORMIN (GLUCOPHAGE) 500 MG tablet Take 1 tablet (500 mg total) by mouth 2 (two) times daily with a meal. 180 tablet 3  . spironolactone (ALDACTONE) 50 MG tablet Take 1 tablet (50 mg total) by mouth daily. 30 tablet 6  . traMADol (ULTRAM) 50 MG tablet Take 50 mg  by mouth 3 (three) times daily as needed.    Marland Kitchen glucose blood (ONETOUCH VERIO) test strip Use as instructed 100 each 12  . ONETOUCH DELICA LANCETS FINE MISC Use to check BG once daily 100 each 2   No current facility-administered medications for this visit.    Past Medical History  Diagnosis Date  . Asthma   . Hypertension   . GERD (gastroesophageal reflux disease)   . History of nephrolithiasis   . Headache(784.0)   . Type II or unspecified type diabetes mellitus without mention of complication, uncontrolled     borderline  . NICM (nonischemic cardiomyopathy) ([place])     a. LHC (08/2013):  no CAD  . Chronic systolic heart failure ([place])     Echo (08/26/13):  Mild LVH. EF 25% to 30%. Diffuse HK. Aortic valve: There was trivial regurgitation. Mitral valve: There was mild regurgitation. Left atrium: The atrium was mildly dilated.  . Stroke [facility])     Past Surgical History  Procedure Laterality Date  . Abdominal hysterectomy    . Oophorectomy      removed 1 ovary and 1 ovary remains.  . Ureteral extraction of kidney stone    . Left and right heart catheterization with coronary angiogram N/A 09/03/2013    Procedure: LEFT AND RIGHT HEART CATHETERIZATION WITH CORONARY ANGIOGRAM;  Surgeon: [name], MD;  Location: [facility]  CATH LAB;  Service: Cardiovascular;  Laterality: N/A;    ROS:  Otherwise as stated in the HPI and negative for all other systems.  PHYSICAL EXAM BP 161/88 mmHg  Pulse 81  Ht 5\' 4"  (1.626 m)  Wt 178 lb (80.74 kg)  BMI 30.54 kg/m2 GENERAL:  Well appearing NECK:  Positive jugular venous distention at 6 cm, waveform within normal limits, carotid upstroke brisk and symmetric, no bruits, no thyromegaly LUNGS:  Clear to auscultation bilaterally HEART:  PMI not displaced or sustained,S1 and S2 within normal limits, no S3, no S4, no clicks, no rubs, 2 out of 6 brief apical systolic murmur radiating out the aortic outflow tract, no diastolic murmurs ABD:  Flat, positive  bowel sounds normal in frequency in pitch, no bruits, no rebound, no guarding, no midline pulsatile mass, no hepatomegaly, no splenomegaly SKIN:  No rashes no nodules EXT:  2+ pulses throughout, mild bilateral lower extremity edema NEURO:  Left facial droop, motor grossly intact throughout   ASSESSMENT AND PLAN  CARDIOMYOPATHY:    Today I am going to add Entresto 49/51 and stop Cozaar.  I will titrate this as tolerated.  I will check a BMET in 10 days.  Of note she can reduce her ASA to 81 mg.   HTN:  This is being managed in the context of treating his CHF. The meds will be changed as above.  Her blood pressure is still not controlled.

## 2015-03-25 ENCOUNTER — Telehealth: Payer: Self-pay

## 2015-03-25 NOTE — Telephone Encounter (Signed)
Insurance denied prior authorization for [employer]

## 2015-03-25 NOTE — Telephone Encounter (Signed)
The patient was actually started on this by Dr. Loma Messing will probably need to call his office and see if they can call the insurance company and help justify the need for this medicine

## 2015-03-29 ENCOUNTER — Other Ambulatory Visit: Payer: Self-pay | Admitting: Family

## 2015-05-03 ENCOUNTER — Encounter: Payer: Self-pay | Admitting: Family Medicine

## 2015-05-03 ENCOUNTER — Ambulatory Visit (INDEPENDENT_AMBULATORY_CARE_PROVIDER_SITE_OTHER): Payer: Medicare Other

## 2015-05-03 ENCOUNTER — Ambulatory Visit (INDEPENDENT_AMBULATORY_CARE_PROVIDER_SITE_OTHER): Payer: Medicare Other | Admitting: Family Medicine

## 2015-05-03 VITALS — BP 140/79 | HR 88 | Temp 97.1°F | Ht 64.0 in | Wt 180.0 lb

## 2015-05-03 DIAGNOSIS — M25561 Pain in right knee: Secondary | ICD-10-CM

## 2015-05-03 DIAGNOSIS — M25571 Pain in right ankle and joints of right foot: Secondary | ICD-10-CM | POA: Diagnosis not present

## 2015-05-03 DIAGNOSIS — I5022 Chronic systolic (congestive) heart failure: Secondary | ICD-10-CM

## 2015-05-03 DIAGNOSIS — Z79899 Other long term (current) drug therapy: Secondary | ICD-10-CM

## 2015-05-03 NOTE — Progress Notes (Signed)
BP 140/79 mmHg  Pulse 88  Temp(Src) 97.1 F (36.2 C) (Oral)  Ht 5\' 4"  (1.626 m)  Wt 180 lb (81.647 kg)  BMI 30.88 kg/m2   Subjective:    Patient ID: [name], female    DOB: 1945/10/29, 70 y.o.   MRN: [id number]  HPI: SUNNI DIFRANCO is a 70 y.o. female presenting on 05/03/2015 for Foot & knee pain   HPI Fall and knee and ankle pain Patient had a fall on her right knee and she tripped on a step and landed on a carpeted room. She is having some difficulty with ambulation because of the pain and tenderness in that right knee in that right ankle but can bear weight as fine. She denies any fevers or chills or overlying warmth or skin changes or lacerations. She has been using naproxen sometimes to help with the pain and it has been helping some.  Relevant past medical, surgical, family and social history reviewed and updated as indicated. Interim medical history since our last visit reviewed. Allergies and medications reviewed and updated.  Review of Systems  Constitutional: Negative for fever and chills.  HENT: Negative for congestion, ear discharge and ear pain.   Eyes: Negative for redness and visual disturbance.  Respiratory: Negative for chest tightness and shortness of breath.   Cardiovascular: Negative for chest pain and leg swelling.  Genitourinary: Negative for dysuria and difficulty urinating.  Musculoskeletal: Positive for arthralgias. Negative for back pain and gait problem.  Skin: Negative for rash.  Neurological: Negative for dizziness, light-headedness and headaches.  Psychiatric/Behavioral: Negative for behavioral problems and agitation.  All other systems reviewed and are negative.   Per HPI unless specifically indicated above     Medication List       This list is accurate as of: 05/03/15  2:25 PM.  Always use your most recent med list.               albuterol 108 (90 Base) MCG/ACT inhaler  Commonly known as:  PROVENTIL HFA;VENTOLIN HFA    Inhale 2 puffs into the lungs every 6 (six) hours as needed.     amLODipine 5 MG tablet  Commonly known as:  NORVASC  Take 1 tablet (5 mg total) by mouth daily.     aspirin 81 MG tablet  Take 81 mg by mouth daily.     bisoprolol 10 MG tablet  Commonly known as:  ZEBETA  Take 1 tablet (10 mg total) by mouth daily.     escitalopram 10 MG tablet  Commonly known as:  LEXAPRO  Take 1 tablet (10 mg total) by mouth daily.     furosemide 20 MG tablet  Commonly known as:  LASIX  Take 1 tablet (20 mg total) by mouth as needed. Take one tablet for two days. Hold other tablets for further use as directed     glucose blood test strip  Commonly known as:  ONETOUCH VERIO  Use as instructed     metFORMIN 500 MG tablet  Commonly known as:  GLUCOPHAGE  TAKE ONE TABLET BY MOUTH TWICE DAILY WITH  A  MEAL     ONETOUCH DELICA LANCETS FINE Misc  Use to check BG once daily     sacubitril-valsartan 49-51 MG  Commonly known as:  ENTRESTO  Take 1 tablet by mouth 2 (two) times daily.     spironolactone 50 MG tablet  Commonly known as:  ALDACTONE  Take 1 tablet (50 mg total) by mouth  daily.     traMADol 50 MG tablet  Commonly known as:  ULTRAM  Take 50 mg by mouth 3 (three) times daily as needed.           Objective:    BP 140/79 mmHg  Pulse 88  Temp(Src) 97.1 F (36.2 C) (Oral)  Ht 5\' 4"  (1.626 m)  Wt 180 lb (81.647 kg)  BMI 30.88 kg/m2  Wt Readings from Last 3 Encounters:  05/03/15 180 lb (81.647 kg)  03/24/15 178 lb (80.74 kg)  02/09/15 177 lb (80.287 kg)    Physical Exam  Constitutional: She is oriented to person, place, and time. She appears well-developed and well-nourished. No distress.  Eyes: Conjunctivae and EOM are normal.  Cardiovascular: Normal rate, regular rhythm, normal heart sounds and intact distal pulses.   No murmur heard. Pulmonary/Chest: Effort normal and breath sounds normal. No respiratory distress. She has no wheezes.  Musculoskeletal: Normal range of  motion. She exhibits no edema.       Right knee: She exhibits swelling (small amount of swelling). She exhibits normal range of motion, no effusion, no ecchymosis, no deformity, no erythema, normal alignment, no LCL laxity, normal patellar mobility, normal meniscus and no MCL laxity. Tenderness found. Patellar tendon (Anterior tenderness) tenderness noted. No medial joint line, no lateral joint line, no MCL and no LCL tenderness noted.  Neurological: She is alert and oriented to person, place, and time. Coordination normal.  Skin: Skin is warm and dry. No rash noted. She is not diaphoretic.  Psychiatric: She has a normal mood and affect. Her behavior is normal.  Nursing note and vitals reviewed.   Right knee x-ray: On left knee x-ray shows significant narrowing of the medial compartment but no acute bony abnormalities. Await final read by radiologist.  Right ankle x-ray: No acute bony abnormalities are seen. Await final read by radiologist    Assessment & Plan:   Problem List Items Addressed This Visit    None    Visit Diagnoses    Pain in joint, ankle and foot, right    -  Primary    Relevant Orders    DG Foot Complete Right    Knee pain, acute, right        No fracture but significant osteoarthritis seen on x-ray. Continue anti-inflammatories and ice and heat and slowly increase activity.    Relevant Orders    DG Knee 1-2 Views Right        Follow up plan: Return if symptoms worsen or fail to improve.  Counseling provided for all of the vaccine components Orders Placed This Encounter  Procedures  . DG Foot Complete Right  . DG Knee 1-2 Views Right    Caryl Pina, MD [facility] 05/03/2015, 2:25 PM

## 2015-05-04 LAB — BMP8+EGFR
BUN/Creatinine Ratio: 25 (ref 11–26)
BUN: 20 mg/dL (ref 8–27)
CO2: 25 mmol/L (ref 18–29)
Calcium: 9.7 mg/dL (ref 8.7–10.3)
Chloride: 109 mmol/L — ABNORMAL HIGH (ref 96–106)
Creatinine, Ser: 0.8 mg/dL (ref 0.57–1.00)
GFR calc Af Amer: 86 mL/min/{1.73_m2} (ref >59)
GFR calc non Af Amer: 75 mL/min/{1.73_m2} (ref >59)
Glucose: 140 mg/dL — ABNORMAL HIGH (ref 65–99)
Potassium: 4 mmol/L (ref 3.5–5.2)
Sodium: 146 mmol/L — ABNORMAL HIGH (ref 134–144)

## 2015-05-05 ENCOUNTER — Ambulatory Visit (INDEPENDENT_AMBULATORY_CARE_PROVIDER_SITE_OTHER): Payer: Medicare Other | Admitting: Cardiology

## 2015-05-05 ENCOUNTER — Encounter: Payer: Self-pay | Admitting: Cardiology

## 2015-05-05 ENCOUNTER — Telehealth: Payer: Self-pay | Admitting: *Deleted

## 2015-05-05 VITALS — BP 131/84 | HR 100 | Ht 64.0 in | Wt 175.0 lb

## 2015-05-05 DIAGNOSIS — I429 Cardiomyopathy, unspecified: Secondary | ICD-10-CM | POA: Diagnosis not present

## 2015-05-05 DIAGNOSIS — I428 Other cardiomyopathies: Secondary | ICD-10-CM

## 2015-05-05 NOTE — Telephone Encounter (Signed)
Pt has been notified of lab results by phone. I called pt and she said she was seeing Dr. Percival Spanish now for her appt with him. I will fax lab results to Dr. Laurance Flatten.

## 2015-05-05 NOTE — Progress Notes (Signed)
HPI  The patient presents for follow up of dyspnea.  Was previously found to have an EF of 25%.  Her last EF is about 35% perhaps slightly higher than reported previously.  I have been titrating her meds.  At the last appt I stopped Cozaar and started [facility].  Follow up labs two days ago demonstrated normal creat.  She returns for med titration.  Since I last saw her she has done OK.  She still has class II symptoms.  She denies any chest pain, presyncope, or syncope.  She has no palpitations.  She did fall and had some chest discomfort with this.    Allergies  Allergen Reactions  . Levaquin [Levofloxacin In D5w] Diarrhea  . Penicillins Rash    Current Outpatient Prescriptions  Medication Sig Dispense Refill  . albuterol (PROVENTIL HFA;VENTOLIN HFA) 108 (90 BASE) MCG/ACT inhaler Inhale 2 puffs into the lungs every 6 (six) hours as needed. 1 Inhaler 6  . amLODipine (NORVASC) 5 MG tablet Take 1 tablet (5 mg total) by mouth daily. 30 tablet 11  . aspirin 81 MG tablet Take 81 mg by mouth daily.    . bisoprolol (ZEBETA) 10 MG tablet Take 1 tablet (10 mg total) by mouth daily. 30 tablet 11  . escitalopram (LEXAPRO) 10 MG tablet Take 1 tablet (10 mg total) by mouth daily. 30 tablet 3  . furosemide (LASIX) 20 MG tablet Take 1 tablet (20 mg total) by mouth as needed. Take one tablet for two days. Hold other tablets for further use as directed 30 tablet 0  . metFORMIN (GLUCOPHAGE) 500 MG tablet TAKE ONE TABLET BY MOUTH TWICE DAILY WITH  A  MEAL 180 tablet 0  . sacubitril-valsartan (ENTRESTO) 49-51 MG Take 1 tablet by mouth 2 (two) times daily. 60 tablet 6  . spironolactone (ALDACTONE) 50 MG tablet Take 1 tablet (50 mg total) by mouth daily. 30 tablet 6  . traMADol (ULTRAM) 50 MG tablet Take 50 mg by mouth 3 (three) times daily as needed.    Marland Kitchen glucose blood (ONETOUCH VERIO) test strip Use as instructed 100 each 12  . ONETOUCH DELICA LANCETS FINE MISC Use to check BG once daily 100 each 2   No  current facility-administered medications for this visit.    Past Medical History  Diagnosis Date  . Asthma   . Hypertension   . GERD (gastroesophageal reflux disease)   . History of nephrolithiasis   . Headache(784.0)   . Type II or unspecified type diabetes mellitus without mention of complication, uncontrolled     borderline  . NICM (nonischemic cardiomyopathy) ([place])     a. LHC (08/2013):  no CAD  . Chronic systolic heart failure ([place])     Echo (08/26/13):  Mild LVH. EF 25% to 30%. Diffuse HK. Aortic valve: There was trivial regurgitation. Mitral valve: There was mild regurgitation. Left atrium: The atrium was mildly dilated.  . Stroke [facility])     Past Surgical History  Procedure Laterality Date  . Abdominal hysterectomy    . Oophorectomy      removed 1 ovary and 1 ovary remains.  . Ureteral extraction of kidney stone    . Left and right heart catheterization with coronary angiogram N/A 09/03/2013    Procedure: LEFT AND RIGHT HEART CATHETERIZATION WITH CORONARY ANGIOGRAM;  Surgeon: [name], MD;  Location: [facility] CATH LAB;  Service: Cardiovascular;  Laterality: N/A;    ROS:  Otherwise as stated in the HPI and negative for all  other systems.  PHYSICAL EXAM BP 131/84 mmHg  Pulse 100  Ht 5\' 4"  (1.626 m)  Wt 175 lb (79.379 kg)  BMI 30.02 kg/m2 GENERAL:  Well appearing NECK:  Positive jugular venous distention at 6 cm, waveform within normal limits, carotid upstroke brisk and symmetric, no bruits, no thyromegaly LUNGS:  Clear to auscultation bilaterally HEART:  PMI not displaced or sustained,S1 and S2 within normal limits, no S3, no S4, no clicks, no rubs, 2 out of 6 brief apical systolic murmur radiating out the aortic outflow tract, no diastolic murmurs ABD:  Flat, positive bowel sounds normal in frequency in pitch, no bruits, no rebound, no guarding, no midline pulsatile mass, no hepatomegaly, no splenomegaly SKIN:  No rashes no nodules EXT:  2+ pulses throughout,  mild bilateral lower extremity edema NEURO:  Left facial droop, motor grossly intact throughout  EKG:  Sinus rhythm, rate 97, right axis deviation, QTC prolonged, premature ectopic complex, poor anterior R wave progression. 05/05/2015   ASSESSMENT AND PLAN  CARDIOMYOPATHY:    Today I am going to increase the Entresto 97/103 and stop Cozaar.   I will check a BMET in 10 days.    If her symptoms do not improved and EF remains low I will have a discussion with her about an ICD.   HTN:  This is being managed in the context of treating his CHF. The meds will be changed as above.  Her blood pressure is still not controlled.

## 2015-05-11 ENCOUNTER — Encounter: Payer: Self-pay | Admitting: *Deleted

## 2015-05-13 ENCOUNTER — Telehealth: Payer: Self-pay | Admitting: *Deleted

## 2015-05-13 NOTE — Telephone Encounter (Signed)
OFFICE VISIT FAXED TO THE NUMBER PROVIDED. PA REF # [id number]

## 2015-05-17 ENCOUNTER — Telehealth: Payer: Self-pay | Admitting: Family Medicine

## 2015-05-21 ENCOUNTER — Telehealth: Payer: Self-pay | Admitting: *Deleted

## 2015-05-21 NOTE — Telephone Encounter (Signed)
Left message for pt that we have received PA for [facility] with the Authorization # [id number] good thru 04/09/2016.  If she needs assistance with the cost with can try doing a tier exception for pts with Medicare Part D.  Requested she call back with any questions or concerns.   Pt has been on 49/51 mg but during the last office visit Dr Percival Spanish did want to increase this dose once insurance coverage was determined.  I will notify him of the above and see if he wants to titrate the dosage.

## 2015-05-23 NOTE — Telephone Encounter (Signed)
Go to the next dose as in the note.

## 2015-05-26 NOTE — Telephone Encounter (Signed)
Left message to call back to discuss changing dose of Entresto.

## 2015-06-01 NOTE — Telephone Encounter (Signed)
Lm to cb - to discuss Entresto and why [facility] couldn't fill the RX.

## 2015-06-01 NOTE — Telephone Encounter (Signed)
Follow up      Calling to let the nurse know that [email or web address]  could not fill entresto presc.

## 2015-06-02 NOTE — Telephone Encounter (Signed)
Spoke with pharmacy - at first they said the cash price for the medication was $462 - Advised wev have obtained a PA and she should only have a copay.  Copay is $45.  They have not ordered the medication and don't have it in stock.  They will order it today and it should be there and ready for pick up by tomorrow at/after 2pm.  Called and lm for pt advising her of this and requested she call back with questions of concerns.

## 2015-07-21 ENCOUNTER — Telehealth: Payer: Self-pay | Admitting: Family Medicine

## 2015-09-07 ENCOUNTER — Telehealth: Payer: Self-pay | Admitting: Family Medicine

## 2015-10-18 ENCOUNTER — Telehealth: Payer: Self-pay | Admitting: Family Medicine

## 2015-11-06 ENCOUNTER — Other Ambulatory Visit: Payer: Self-pay | Admitting: Cardiology

## 2015-11-06 DIAGNOSIS — I10 Essential (primary) hypertension: Secondary | ICD-10-CM

## 2015-11-08 NOTE — Telephone Encounter (Signed)
REFILL 

## 2015-11-30 ENCOUNTER — Other Ambulatory Visit: Payer: Self-pay

## 2015-11-30 MED ORDER — SACUBITRIL-VALSARTAN 49-51 MG PO TABS: 1.0000 | ORAL_TABLET | Freq: Two times a day (BID) | ORAL | 0 refills | Status: DC

## 2015-12-06 ENCOUNTER — Ambulatory Visit (INDEPENDENT_AMBULATORY_CARE_PROVIDER_SITE_OTHER): Payer: Medicare Other | Admitting: Family

## 2015-12-06 ENCOUNTER — Telehealth: Payer: Self-pay | Admitting: Family Medicine

## 2015-12-06 ENCOUNTER — Encounter: Payer: Self-pay | Admitting: Family

## 2015-12-06 VITALS — BP 141/71 | HR 56 | Temp 97.4°F | Ht 64.0 in | Wt 189.6 lb

## 2015-12-06 DIAGNOSIS — N611 Abscess of the breast and nipple: Secondary | ICD-10-CM

## 2015-12-06 MED ORDER — SULFAMETHOXAZOLE-TRIMETHOPRIM 800-160 MG PO TABS: 1.0000 | ORAL_TABLET | Freq: Two times a day (BID) | ORAL | 0 refills | Status: DC

## 2015-12-06 NOTE — Progress Notes (Signed)
   Subjective:    Patient ID: [name], female    DOB: Oct 31, 1945, 70 y.o.   MRN: [id number]  HPI Pt presents to the office today with a "boil" on her right breast that she noticed 6 months ago, but has become bothersome over the last two weeks. PT states it is draining a yellow, purulent discharge that "smells". Pt states she has intermittent achying pain of 7 out 10. She reports her last mammogram was 02/02/14 that was negative.    Review of Systems  Respiratory: Negative.  Negative for cough and shortness of breath.   Cardiovascular: Negative.  Negative for leg swelling.  Gastrointestinal: Negative.   Skin: Positive for wound.  All other systems reviewed and are negative.      Objective:   Physical Exam  Constitutional: She is oriented to person, place, and time. She appears well-developed and well-nourished. No distress.  HENT:  Head: Normocephalic.  Cardiovascular: Normal rate, regular rhythm, normal heart sounds and intact distal pulses.   No murmur heard. Pulmonary/Chest: Effort normal and breath sounds normal. No respiratory distress. She has no wheezes. Right breast exhibits skin change and tenderness (under nipple absecess approx 3.5cm X1.5Cm).  Abdominal: Soft. Bowel sounds are normal. She exhibits no distension. There is no tenderness.  Musculoskeletal: Normal range of motion. She exhibits no edema or tenderness.  Neurological: She is alert and oriented to person, place, and time.  Skin: Skin is warm and dry.  Psychiatric: She has a normal mood and affect. Her behavior is normal. Judgment and thought content normal.  Vitals reviewed.   BP (!) 141/71   Pulse (!) 56   Temp 97.4 F (36.3 C) (Oral)   Ht 5\' 4"  (1.626 m)   Wt 189 lb 9.6 oz (86 kg)   BMI 32.54 kg/m   Local anesthesia Lidocaine 2% with 15ml Betadine prep Small incision made with purulent and sanguineous fluid Cleaned with Saline Antibiotic ointment Dressing applied       Assessment &  Plan:  1. Abscess of right breast -Warm compresses  -Good hand hygiene  -Let abscess drain -RTO 12/09/15 to recheck, pt needs follow up for chronic follow up and lab work! Pt has not had A1C this year!  - sulfamethoxazole-trimethoprim (BACTRIM DS) 800-160 MG tablet; Take 1 tablet by mouth 2 (two) times daily.  Dispense: 14 tablet; Refill: 0   Evelina Dun, FNP

## 2015-12-06 NOTE — Patient Instructions (Signed)

## 2015-12-08 ENCOUNTER — Ambulatory Visit (INDEPENDENT_AMBULATORY_CARE_PROVIDER_SITE_OTHER): Payer: Medicare Other | Admitting: Family

## 2015-12-08 ENCOUNTER — Encounter: Payer: Self-pay | Admitting: Family

## 2015-12-08 VITALS — BP 136/69 | HR 69 | Temp 97.5°F | Ht 64.0 in | Wt 187.0 lb

## 2015-12-08 DIAGNOSIS — Z5189 Encounter for other specified aftercare: Secondary | ICD-10-CM | POA: Diagnosis not present

## 2015-12-08 DIAGNOSIS — L0291 Cutaneous abscess, unspecified: Secondary | ICD-10-CM

## 2015-12-08 NOTE — Patient Instructions (Signed)

## 2015-12-08 NOTE — Progress Notes (Signed)
   Subjective:    Patient ID: [name], female    DOB: Dec 30, 1945, 70 y.o.   MRN: [id number]  HPI Pt presents to the office today recheck abscess on right breast. Pt was seen on 12/06/15 and had it lanced and started on bactrim. Pt states it has continued to drain a clear yellow discharge. PT states the pain is improved, but continues to have 6 out 10.    Review of Systems  Skin: Positive for wound.  All other systems reviewed and are negative.      Objective:   Physical Exam  Constitutional: She is oriented to person, place, and time. She appears well-developed and well-nourished. No distress.  HENT:  Head: Normocephalic and atraumatic.  Right Ear: External ear normal.  Mouth/Throat: Oropharynx is clear and moist.  Eyes: Pupils are equal, round, and reactive to light.  Neck: Normal range of motion. Neck supple. No thyromegaly present.  Cardiovascular: Normal rate, regular rhythm, normal heart sounds and intact distal pulses.   No murmur heard. Pulmonary/Chest: Effort normal and breath sounds normal. No respiratory distress. She has no wheezes.  Abdominal: Soft. Bowel sounds are normal. She exhibits no distension. There is no tenderness.  Musculoskeletal: Normal range of motion. She exhibits no edema or tenderness.  Neurological: She is alert and oriented to person, place, and time. She has normal reflexes. No cranial nerve deficit.  Skin: Skin is warm and dry.  Healing abscess on right breast. Erythemas improved, hardness improved!   Psychiatric: She has a normal mood and affect. Her behavior is normal. Judgment and thought content normal.  Vitals reviewed.    BP 136/69   Pulse 69   Temp 97.5 F (36.4 C) (Oral)   Ht 5\' 4"  (1.626 m)   Wt 187 lb (84.8 kg)   BMI 32.10 kg/m       Assessment & Plan:  1. Abscess -Continue bactrim -Warm compresses RTo prn   2. Encounter for wound re-check -Continue bactrim -warm compresses -Do not pick or squeeze  RTO  prn   Evelina Dun, FNP

## 2015-12-27 ENCOUNTER — Telehealth: Payer: Self-pay | Admitting: Family Medicine

## 2015-12-27 NOTE — Telephone Encounter (Signed)
Pt having sxs of depression appt scheduled

## 2015-12-30 ENCOUNTER — Ambulatory Visit (INDEPENDENT_AMBULATORY_CARE_PROVIDER_SITE_OTHER): Payer: Medicare Other | Admitting: Family

## 2015-12-30 VITALS — BP 172/78 | HR 61 | Temp 98.3°F | Ht 64.0 in | Wt 188.4 lb

## 2015-12-30 DIAGNOSIS — F329 Major depressive disorder, single episode, unspecified: Secondary | ICD-10-CM | POA: Diagnosis not present

## 2015-12-30 DIAGNOSIS — F32A Depression, unspecified: Secondary | ICD-10-CM

## 2015-12-30 DIAGNOSIS — Z23 Encounter for immunization: Secondary | ICD-10-CM | POA: Diagnosis not present

## 2015-12-30 MED ORDER — ESCITALOPRAM OXALATE 10 MG PO TABS: 10.0000 mg | ORAL_TABLET | Freq: Every day | ORAL | 3 refills | Status: DC

## 2015-12-30 NOTE — Progress Notes (Signed)
   Subjective:    Patient ID: [name], female    DOB: [birth year], 70 y.o.   MRN: [id number]  Pt presents to the office today for depression. Pt states her son died December 08, 2014 unexpectedly and she just had the anniversary of his death and can not seem to "get over it".  Depression         This is a new problem.  The current episode started more than 1 month ago.   The onset quality is gradual.   The problem occurs constantly.  The problem has been waxing and waning since onset.  Associated symptoms include decreased concentration, helplessness, hopelessness, irritable, restlessness and sad.  Associated symptoms include no suicidal ideas.  Past treatments include nothing.  Compliance with treatment is good.  Risk factors include major life event.      Review of Systems  Psychiatric/Behavioral: Positive for decreased concentration and depression. Negative for suicidal ideas.  All other systems reviewed and are negative.      Objective:   Physical Exam  Constitutional: She is oriented to person, place, and time. She appears well-developed and well-nourished. She is irritable. No distress.  HENT:  Head: Normocephalic.  Eyes: Pupils are equal, round, and reactive to light.  Neck: Normal range of motion. Neck supple. No thyromegaly present.  Cardiovascular: Normal rate, regular rhythm, normal heart sounds and intact distal pulses.   No murmur heard. Pulmonary/Chest: Effort normal and breath sounds normal. No respiratory distress. She has no wheezes.  Abdominal: Soft. Bowel sounds are normal. She exhibits no distension. There is no tenderness.  Musculoskeletal: Normal range of motion. She exhibits no edema or tenderness.  Neurological: She is alert and oriented to person, place, and time.  Skin: Skin is warm and dry.  Psychiatric: She has a normal mood and affect. Her behavior is normal. Judgment and thought content normal.  Vitals reviewed.    BP (!) 159/70   Pulse (!) 59   Temp  98.3 F (36.8 C) (Oral)   Ht 5\' 4"  (1.626 m)   Wt 188 lb 6.4 oz (85.5 kg)   BMI 32.34 kg/m       Assessment & Plan:  1. Depression -Pt started on lexapro 10 mg today -Stress management discussed -RTO in 4 weeks to recheck depression and HTN - escitalopram (LEXAPRO) 10 MG tablet; Take 1 tablet (10 mg total) by mouth daily.  Dispense: 30 tablet; Refill: [place], FNP

## 2015-12-30 NOTE — Patient Instructions (Signed)
Major Depressive Disorder Major depressive disorder is a mental illness. It also may be called clinical depression or unipolar depression. Major depressive disorder usually causes feelings of sadness, hopelessness, or helplessness. Some people with this disorder do not feel particularly sad but lose interest in doing things they used to enjoy (anhedonia). Major depressive disorder also can cause physical symptoms. It can interfere with work, school, relationships, and other normal everyday activities. The disorder varies in severity but is longer lasting and more serious than the sadness we all feel from time to time in our lives. Major depressive disorder often is triggered by stressful life events or major life changes. Examples of these triggers include divorce, loss of your job or home, a move, and the death of a family member or close friend. Sometimes this disorder occurs for no obvious reason at all. People who have family members with major depressive disorder or bipolar disorder are at higher risk for developing this disorder, with or without life stressors. Major depressive disorder can occur at any age. It may occur just once in your life (single episode major depressive disorder). It may occur multiple times (recurrent major depressive disorder). SYMPTOMS People with major depressive disorder have either anhedonia or depressed mood on nearly a daily basis for at least 2 weeks or longer. Symptoms of depressed mood include:  Feelings of sadness (blue or down in the dumps) or emptiness.  Feelings of hopelessness or helplessness.  Tearfulness or episodes of crying (may be observed by others).  Irritability (children and adolescents). In addition to depressed mood or anhedonia or both, people with this disorder have at least four of the following symptoms:  Difficulty sleeping or sleeping too much.   Significant change (increase or decrease) in appetite or weight.   Lack of energy or  motivation.  Feelings of guilt and worthlessness.   Difficulty concentrating, remembering, or making decisions.  Unusually slow movement (psychomotor retardation) or restlessness (as observed by others).   Recurrent wishes for death, recurrent thoughts of self-harm (suicide), or a suicide attempt. People with major depressive disorder commonly have persistent negative thoughts about themselves, other people, and the world. People with severe major depressive disorder may experiencedistorted beliefs or perceptions about the world (psychotic delusions). They also may see or hear things that are not real (psychotic hallucinations). DIAGNOSIS Major depressive disorder is diagnosed through an assessment by your health care provider. Your health care provider will ask aboutaspects of your daily life, such as mood,sleep, and appetite, to see if you have the diagnostic symptoms of major depressive disorder. Your health care provider may ask about your medical history and use of alcohol or drugs, including prescription medicines. Your health care provider also may do a physical exam and blood work. This is because certain medical conditions and the use of certain substances can cause major depressive disorder-like symptoms (secondary depression). Your health care provider also may refer you to a mental health specialist for further evaluation and treatment. TREATMENT It is important to recognize the symptoms of major depressive disorder and seek treatment. The following treatments can be prescribed for this disorder:   Medicine. Antidepressant medicines usually are prescribed. Antidepressant medicines are thought to correct chemical imbalances in the brain that are commonly associated with major depressive disorder. Other types of medicine may be added if the symptoms do not respond to antidepressant medicines alone or if psychotic delusions or hallucinations occur.  Talk therapy. Talk therapy can be  helpful in treating major depressive disorder by providing   support, education, and guidance. Certain types of talk therapy also can help with negative thinking (cognitive behavioral therapy) and with relationship issues that trigger this disorder (interpersonal therapy). A mental health specialist can help determine which treatment is best for you. Most people with major depressive disorder do well with a combination of medicine and talk therapy. Treatments involving electrical stimulation of the brain can be used in situations with extremely severe symptoms or when medicine and talk therapy do not work over time. These treatments include electroconvulsive therapy, transcranial magnetic stimulation, and vagal nerve stimulation.   This information is not intended to replace advice given to you by your health care provider. Make sure you discuss any questions you have with your health care provider.   Document Released: 07/22/2012 Document Revised: 04/17/2014 Document Reviewed: 07/22/2012 Elsevier Interactive Patient Education 2016 [employer]  

## 2016-01-05 ENCOUNTER — Other Ambulatory Visit: Payer: Self-pay | Admitting: Cardiology

## 2016-01-05 DIAGNOSIS — I10 Essential (primary) hypertension: Secondary | ICD-10-CM

## 2016-01-05 NOTE — Telephone Encounter (Signed)
Rx(s) sent to pharmacy electronically.  

## 2016-01-11 ENCOUNTER — Other Ambulatory Visit: Payer: Self-pay | Admitting: Cardiology

## 2016-01-11 NOTE — Telephone Encounter (Signed)
Rx request sent to pharmacy.  

## 2016-02-01 NOTE — Progress Notes (Signed)
HPI  The patient presents for follow up of dyspnea.  Was previously found to have an EF of 25%.  Her last EF is about 35% perhaps slightly higher than reported previously.  I have been titrating her meds.  She is now on Entresto.  She did have a little lightheadedness with this. She chronically sleeps on 3 pillows but this is not new. She occasionally has shortness of breath she attributes to her asthma and she takes an albuterol. She otherwise feels reasonably well. She's not having any acute PND. Her weights have been stable. She's not having any new swelling. She denies any chest pressure, neck or arm discomfort. She's had no palpitations, presyncope or syncope.  Allergies  Allergen Reactions  . Levaquin [Levofloxacin In D5w] Diarrhea  . Penicillins Rash    Current Outpatient Prescriptions  Medication Sig Dispense Refill  . albuterol (PROVENTIL HFA;VENTOLIN HFA) 108 (90 BASE) MCG/ACT inhaler Inhale 2 puffs into the lungs every 6 (six) hours as needed. 1 Inhaler 6  . amLODipine (NORVASC) 2.5 MG tablet Take 1 tablet (2.5 mg total) by mouth daily. 90 tablet 3  . aspirin (GOODSENSE ASPIRIN) 325 MG tablet Take 325 mg by mouth.    . bisoprolol (ZEBETA) 10 MG tablet TAKE ONE TABLET BY MOUTH ONCE DAILY 30 tablet 11  . escitalopram (LEXAPRO) 10 MG tablet Take 1 tablet (10 mg total) by mouth daily. 30 tablet 3  . furosemide (LASIX) 20 MG tablet Take 1 tablet (20 mg total) by mouth as needed. Take one tablet for two days. Hold other tablets for further use as directed 30 tablet 0  . metFORMIN (GLUCOPHAGE) 500 MG tablet TAKE ONE TABLET BY MOUTH TWICE DAILY WITH  A  MEAL 180 tablet 0  . spironolactone (ALDACTONE) 50 MG tablet Take 1 tablet (50 mg total) by mouth daily. 90 tablet 3  . traMADol (ULTRAM) 50 MG tablet Take 50 mg by mouth 3 (three) times daily as needed.    Marland Kitchen glucose blood (ONETOUCH VERIO) test strip Use as instructed 100 each 12  . ONETOUCH DELICA LANCETS FINE MISC Use to check BG once  daily 100 each 2  . sacubitril-valsartan (ENTRESTO) 97-103 MG Take 1 tablet by mouth 2 (two) times daily. 60 tablet 6   No current facility-administered medications for this visit.     Past Medical History:  Diagnosis Date  . Asthma   . Chronic systolic heart failure ([place])    Echo (08/26/13):  Mild LVH. EF 25% to 30%. Diffuse HK. Aortic valve: There was trivial regurgitation. Mitral valve: There was mild regurgitation. Left atrium: The atrium was mildly dilated.  Marland Kitchen GERD (gastroesophageal reflux disease)   . Headache(784.0)   . History of nephrolithiasis   . Hypertension   . NICM (nonischemic cardiomyopathy) ([place])    a. LHC (08/2013):  no CAD  . Stroke ([place])   . Type II or unspecified type diabetes mellitus without mention of complication, uncontrolled    borderline    Past Surgical History:  Procedure Laterality Date  . ABDOMINAL HYSTERECTOMY    . LEFT AND RIGHT HEART CATHETERIZATION WITH CORONARY ANGIOGRAM N/A 09/03/2013   Procedure: LEFT AND RIGHT HEART CATHETERIZATION WITH CORONARY ANGIOGRAM;  Surgeon: [name], MD;  Location: [facility] CATH LAB;  Service: Cardiovascular;  Laterality: N/A;  . OOPHORECTOMY     removed 1 ovary and 1 ovary remains.  . ureteral extraction of kidney stone      ROS:  Otherwise as stated in the HPI  and negative for all other systems.  PHYSICAL EXAM BP 124/70   Pulse (!) 56   Ht 5\' 5"  (1.651 m)   Wt 181 lb (82.1 kg)   BMI 30.12 kg/m  GENERAL:  Well appearing NECK:  Positive jugular venous distention at 6 cm, waveform within normal limits, carotid upstroke brisk and symmetric, no bruits, no thyromegaly LUNGS:  Clear to auscultation bilaterally HEART:  PMI not displaced or sustained,S1 and S2 within normal limits, no S3, no S4, no clicks, no rubs, 2 out of 6 brief apical systolic murmur radiating out the aortic outflow tract, no diastolic murmurs ABD:  Flat, positive bowel sounds normal in frequency in pitch, no bruits, no rebound, no  guarding, no midline pulsatile mass, no hepatomegaly, no splenomegaly SKIN:  No rashes no nodules EXT:  2+ pulses throughout, no edema NEURO:  Left facial droop, motor grossly intact throughout  EKG:  Sinus rhythm, rate 56, right axis deviation, QTC prolonged,poor anterior R wave progression, lateral T-wave inversions new area to previous 02/02/2016   ASSESSMENT AND PLAN  CARDIOMYOPATHY:    Today I am going to increase the Entresto 97/103 .   I will check a BMET in 14 days.    If her  EF remains low I will have a discussion with her about an ICD.  She's noted take her Lasix when necessary.  HTN:  I'm going to reduce her Norvasc 2.5 mg daily. If her blood pressure runs low I'll probably get rid of this completely.  ABNORMAL EKG:  He does have some EKG changes. However, she's not had obstructive coronary disease and has no symptoms.  I will follow this with repeat EKGs.

## 2016-02-02 ENCOUNTER — Encounter: Payer: Self-pay | Admitting: Cardiology

## 2016-02-02 ENCOUNTER — Ambulatory Visit (INDEPENDENT_AMBULATORY_CARE_PROVIDER_SITE_OTHER): Payer: Medicare Other | Admitting: Cardiology

## 2016-02-02 VITALS — BP 124/70 | HR 56 | Ht 65.0 in | Wt 181.0 lb

## 2016-02-02 DIAGNOSIS — I428 Other cardiomyopathies: Secondary | ICD-10-CM | POA: Diagnosis not present

## 2016-02-02 DIAGNOSIS — I10 Essential (primary) hypertension: Secondary | ICD-10-CM

## 2016-02-02 DIAGNOSIS — Z79899 Other long term (current) drug therapy: Secondary | ICD-10-CM | POA: Diagnosis not present

## 2016-02-02 MED ORDER — SPIRONOLACTONE 50 MG PO TABS: 50.0000 mg | ORAL_TABLET | Freq: Every day | ORAL | 3 refills | Status: DC

## 2016-02-02 MED ORDER — AMLODIPINE BESYLATE 2.5 MG PO TABS: 2.5000 mg | ORAL_TABLET | Freq: Every day | ORAL | 3 refills | Status: DC

## 2016-02-02 MED ORDER — SACUBITRIL-VALSARTAN 97-103 MG PO TABS: 1.0000 | ORAL_TABLET | Freq: Two times a day (BID) | ORAL | 6 refills | Status: DC

## 2016-02-02 NOTE — Patient Instructions (Addendum)
Medication Instructions:  Please increase your Entresto to 97/103 mg twice a day. Decrease Amlodipine to 2.5 mg a day. Continue all other medications as listed.  Labwork: Please have blood work in 2 weeks at [facility])  Follow-Up: Follow up in 2 months with Dr Percival Spanish in [place].  If you need a refill on your cardiac medications before your next appointment, please call your pharmacy.  Thank you for choosing [facility]!!

## 2016-02-08 ENCOUNTER — Encounter: Payer: Self-pay | Admitting: Family

## 2016-02-08 ENCOUNTER — Ambulatory Visit (INDEPENDENT_AMBULATORY_CARE_PROVIDER_SITE_OTHER): Payer: Medicare Other | Admitting: Family

## 2016-02-08 VITALS — BP 123/67 | HR 61 | Temp 97.7°F | Ht 65.0 in | Wt 180.2 lb

## 2016-02-08 DIAGNOSIS — J4520 Mild intermittent asthma, uncomplicated: Secondary | ICD-10-CM | POA: Diagnosis not present

## 2016-02-08 DIAGNOSIS — J209 Acute bronchitis, unspecified: Secondary | ICD-10-CM | POA: Diagnosis not present

## 2016-02-08 MED ORDER — AZITHROMYCIN 250 MG PO TABS: ORAL_TABLET | ORAL | 0 refills | Status: DC

## 2016-02-08 MED ORDER — FLUTICASONE PROPIONATE 50 MCG/ACT NA SUSP: 2.0000 | Freq: Every day | NASAL | 6 refills | Status: DC

## 2016-02-08 MED ORDER — PREDNISONE 10 MG (21) PO TBPK: ORAL_TABLET | ORAL | 0 refills | Status: DC

## 2016-02-08 NOTE — Progress Notes (Signed)
Subjective:    Patient ID: [name], female    DOB: [birth year], 70 y.o.   MRN: [id number]  Cough  This is a new problem. The current episode started in the past 7 days. The problem has been waxing and waning. The problem occurs every few minutes. The cough is non-productive. Associated symptoms include chills, ear congestion, ear pain (left), headaches, myalgias, nasal congestion, postnasal drip, rhinorrhea, shortness of breath and wheezing. Pertinent negatives include no fever. She has tried rest, OTC cough suppressant and a beta-agonist inhaler for the symptoms. The treatment provided mild relief. Her past medical history is significant for asthma.      Review of Systems  Constitutional: Positive for chills. Negative for fever.  HENT: Positive for ear pain (left), postnasal drip and rhinorrhea.   Respiratory: Positive for cough, shortness of breath and wheezing.   Musculoskeletal: Positive for myalgias.  Neurological: Positive for headaches.  All other systems reviewed and are negative.      Objective:   Physical Exam  Constitutional: She is oriented to person, place, and time. She appears well-developed and well-nourished. No distress.  HENT:  Head: Normocephalic and atraumatic.  Right Ear: External ear normal.  Left Ear: External ear normal.  Nasal passage erythemas with mild swelling  Oropharynx erythemas  Eyes: Pupils are equal, round, and reactive to light.  Neck: Normal range of motion. Neck supple. No thyromegaly present.  Cardiovascular: Normal rate, regular rhythm, normal heart sounds and intact distal pulses.   No murmur heard. Pulmonary/Chest: Effort normal and breath sounds normal. No respiratory distress. She has no wheezes.  Abdominal: Soft. Bowel sounds are normal. She exhibits no distension. There is no tenderness.  Musculoskeletal: Normal range of motion. She exhibits no edema or tenderness.  Neurological: She is alert and oriented to person, place,  and time.  Skin: Skin is warm and dry.  Psychiatric: She has a normal mood and affect. Her behavior is normal. Judgment and thought content normal.  Vitals reviewed.     BP 123/67   Pulse 61   Temp 97.7 F (36.5 C) (Oral)   Ht 5\' 5"  (1.651 m)   Wt 180 lb 3.2 oz (81.7 kg)   BMI 29.99 kg/m '    Assessment & Plan:  1. Acute bronchitis, unspecified organism -- Take meds as prescribed - Use a cool mist humidifier  -Use saline nose sprays frequently -Saline irrigations of the nose can be very helpful if done frequently.  * 4X daily for 1 week*  * Use of a nettie pot can be helpful with this. Follow directions with this* -Force fluids -For any cough or congestion  Use plain Mucinex- regular strength or max strength is fine   * Children- consult with Pharmacist for dosing -For fever or aces or pains- take tylenol or ibuprofen appropriate for age and weight.  * for fevers greater than 101 orally you may alternate ibuprofen and tylenol every  3 hours. -Throat lozenges if help - fluticasone (FLONASE) 50 MCG/ACT nasal spray; Place 2 sprays into both nostrils daily.  Dispense: 16 g; Refill: 6 - predniSONE (STERAPRED UNI-PAK 21 TAB) 10 MG (21) TBPK tablet; Use as directed  Dispense: 21 tablet; Refill: 0 - azithromycin (ZITHROMAX) 250 MG tablet; Take 500 mg once, then 250 mg for four days  Dispense: 6 tablet; Refill: 0  2. Mild intermittent asthma, unspecified whether complicated -Avoid allergens and continue medications  - fluticasone (FLONASE) 50 MCG/ACT nasal spray; Place 2 sprays into both nostrils  daily.  Dispense: 16 g; Refill: 6 - predniSONE (STERAPRED UNI-PAK 21 TAB) 10 MG (21) TBPK tablet; Use as directed  Dispense: 21 tablet; Refill: 0  Evelina Dun, FNP

## 2016-02-08 NOTE — Patient Instructions (Signed)

## 2016-02-11 ENCOUNTER — Telehealth: Payer: Self-pay | Admitting: Family Medicine

## 2016-02-11 MED ORDER — DOXYCYCLINE HYCLATE 100 MG PO TABS: 100.0000 mg | ORAL_TABLET | Freq: Two times a day (BID) | ORAL | 0 refills | Status: DC

## 2016-02-11 MED ORDER — ALBUTEROL SULFATE HFA 108 (90 BASE) MCG/ACT IN AERS
2.0000 | INHALATION_SPRAY | Freq: Four times a day (QID) | RESPIRATORY_TRACT | 6 refills | Status: DC | PRN
Start: 2016-02-11 — End: 2018-07-05

## 2016-02-11 NOTE — Telephone Encounter (Signed)
Patient aware of changes.

## 2016-02-11 NOTE — Telephone Encounter (Signed)
Pt to stop zpak and doxycyline Prescription sent to pharmacy and albuterol Prescription sent to pharmacy .

## 2016-03-13 ENCOUNTER — Other Ambulatory Visit: Payer: Medicare Other

## 2016-03-13 DIAGNOSIS — Z79899 Other long term (current) drug therapy: Secondary | ICD-10-CM | POA: Diagnosis not present

## 2016-03-13 DIAGNOSIS — I428 Other cardiomyopathies: Secondary | ICD-10-CM | POA: Diagnosis not present

## 2016-03-13 DIAGNOSIS — I10 Essential (primary) hypertension: Secondary | ICD-10-CM

## 2016-03-14 LAB — BASIC METABOLIC PANEL
BUN/Creatinine Ratio: 19 (ref 12–28)
BUN: 17 mg/dL (ref 8–27)
CO2: 25 mmol/L (ref 18–29)
Calcium: 9.2 mg/dL (ref 8.7–10.3)
Chloride: 106 mmol/L (ref 96–106)
Creatinine, Ser: 0.89 mg/dL (ref 0.57–1.00)
GFR calc Af Amer: 75 mL/min/{1.73_m2} (ref >59)
GFR calc non Af Amer: 65 mL/min/{1.73_m2} (ref >59)
Glucose: 168 mg/dL — ABNORMAL HIGH (ref 65–99)
Potassium: 4.2 mmol/L (ref 3.5–5.2)
Sodium: 146 mmol/L — ABNORMAL HIGH (ref 134–144)

## 2016-03-16 ENCOUNTER — Encounter: Payer: Self-pay | Admitting: Cardiology

## 2016-03-21 NOTE — Progress Notes (Signed)
HPI  The patient presents for follow up of dyspnea.  Was previously found to have an EF of 25%.  Her last EF is about 35% perhaps slightly higher than reported previously.  I have been titrating her meds.  At the last visit I increased his Entresto. She is now on therapeutic doses.  She did have slight light headedness with this.  However, this seems to be mild.  The patient denies any new symptoms such as chest discomfort, neck or arm discomfort. There has been no new shortness of breath, PND or orthopnea. There have been no reported palpitations, presyncope or syncope.  She is walking about 10 times per month.  She is not as active as I would suggest.   Allergies  Allergen Reactions  . Levaquin [Levofloxacin In D5w] Diarrhea  . Penicillins Rash    Current Outpatient Prescriptions  Medication Sig Dispense Refill  . albuterol (PROVENTIL HFA;VENTOLIN HFA) 108 (90 Base) MCG/ACT inhaler Inhale 2 puffs into the lungs every 6 (six) hours as needed. 1 Inhaler 6  . amLODipine (NORVASC) 2.5 MG tablet Take 1 tablet (2.5 mg total) by mouth daily. 90 tablet 3  . aspirin 81 MG chewable tablet Chew by mouth daily.    . bisoprolol (ZEBETA) 10 MG tablet TAKE ONE TABLET BY MOUTH ONCE DAILY 30 tablet 11  . escitalopram (LEXAPRO) 10 MG tablet Take 1 tablet (10 mg total) by mouth daily. 30 tablet 3  . fluticasone (FLONASE) 50 MCG/ACT nasal spray Place 2 sprays into both nostrils daily. 16 g 6  . furosemide (LASIX) 20 MG tablet Take 1 tablet (20 mg total) by mouth as needed. Take one tablet for two days. Hold other tablets for further use as directed 30 tablet 0  . glucose blood (ONETOUCH VERIO) test strip Use as instructed 100 each 12  . metFORMIN (GLUCOPHAGE) 500 MG tablet TAKE ONE TABLET BY MOUTH TWICE DAILY WITH  A  MEAL 180 tablet 0  . ONETOUCH DELICA LANCETS FINE MISC Use to check BG once daily 100 each 2  . sacubitril-valsartan (ENTRESTO) 97-103 MG Take 1 tablet by mouth 2 (two) times daily. 60 tablet 6   . spironolactone (ALDACTONE) 50 MG tablet Take 1 tablet (50 mg total) by mouth daily. 90 tablet 3  . traMADol (ULTRAM) 50 MG tablet Take 50 mg by mouth 3 (three) times daily as needed.     No current facility-administered medications for this visit.     Past Medical History:  Diagnosis Date  . Asthma   . Chronic systolic heart failure ([place])    Echo (08/26/13):  Mild LVH. EF 25% to 30%. Diffuse HK. Aortic valve: There was trivial regurgitation. Mitral valve: There was mild regurgitation. Left atrium: The atrium was mildly dilated.  Marland Kitchen GERD (gastroesophageal reflux disease)   . Headache(784.0)   . History of nephrolithiasis   . Hypertension   . NICM (nonischemic cardiomyopathy) ([place])    a. LHC (08/2013):  no CAD  . Stroke ([place])   . Type II or unspecified type diabetes mellitus without mention of complication, uncontrolled    borderline    Past Surgical History:  Procedure Laterality Date  . ABDOMINAL HYSTERECTOMY    . LEFT AND RIGHT HEART CATHETERIZATION WITH CORONARY ANGIOGRAM N/A 09/03/2013   Procedure: LEFT AND RIGHT HEART CATHETERIZATION WITH CORONARY ANGIOGRAM;  Surgeon: [name], MD;  Location: [facility] CATH LAB;  Service: Cardiovascular;  Laterality: N/A;  . OOPHORECTOMY     removed 1 ovary and 1  ovary remains.  . ureteral extraction of kidney stone      ROS:    Otherwise as stated in the HPI and negative for all other systems.  PHYSICAL EXAM BP 136/84   Pulse 60   Ht 5\' 4"  (1.626 m)   Wt 181 lb (82.1 kg)   BMI 31.07 kg/m  GENERAL:  Well appearing NECK:  Positive jugular venous distention at 6 cm, waveform within normal limits, carotid upstroke brisk and symmetric, no bruits, no thyromegaly LUNGS:  Clear to auscultation bilaterally HEART:  PMI not displaced or sustained,S1 and S2 within normal limits, no S3, no S4, no clicks, no rubs, 2 out of 6 brief apical systolic murmur radiating out the aortic outflow tract, no diastolic murmurs ABD:  Flat, positive bowel  sounds normal in frequency in pitch, no bruits, no rebound, no guarding, no midline pulsatile mass, no hepatomegaly, no splenomegaly SKIN:  No rashes no nodules EXT:  2+ pulses throughout, no edema   ASSESSMENT AND PLAN  CARDIOMYOPATHY:    She is at target doses of meds.  I will order an echo.  I will talk to her about an ICD if her EF remains below 35%.  She has class II symptoms.    HTN:  I'm going to leave the meds as listed.    ABNORMAL EKG:  She does have some EKG changes. However, she's not had obstructive coronary disease and has no symptoms.  I will follow this with repeat EKGs.

## 2016-03-22 ENCOUNTER — Ambulatory Visit: Payer: Self-pay | Admitting: Cardiology

## 2016-03-22 ENCOUNTER — Encounter: Payer: Self-pay | Admitting: Cardiology

## 2016-03-22 ENCOUNTER — Ambulatory Visit (INDEPENDENT_AMBULATORY_CARE_PROVIDER_SITE_OTHER): Payer: Medicare Other | Admitting: Cardiology

## 2016-03-22 VITALS — BP 136/84 | HR 60 | Ht 64.0 in | Wt 181.0 lb

## 2016-03-22 DIAGNOSIS — I429 Cardiomyopathy, unspecified: Secondary | ICD-10-CM

## 2016-03-22 DIAGNOSIS — I10 Essential (primary) hypertension: Secondary | ICD-10-CM

## 2016-03-22 NOTE — Patient Instructions (Signed)
Medication Instructions:  The current medical regimen is effective;  continue present plan and medications.  Testing/Procedures: Your physician has requested that you have an echocardiogram. Echocardiography is a painless test that uses sound waves to create images of your heart. It provides your doctor with information about the size and shape of your heart and how well your heart's chambers and valves are working. This procedure takes approximately one hour. There are no restrictions for this procedure. Friday 12/15 - Please report to the front entrance desk at 11:15 am.  Follow-Up: Follow up in 4 months with Dr Percival Spanish in [place].  If you need a refill on your cardiac medications before your next appointment, please call your pharmacy.  Thank you for choosing [facility]!!

## 2016-03-23 ENCOUNTER — Encounter: Payer: Self-pay | Admitting: Cardiology

## 2016-03-24 ENCOUNTER — Ambulatory Visit (HOSPITAL_COMMUNITY)
Admission: RE | Admit: 2016-03-24 | Discharge: 2016-03-24 | Disposition: A | Discharge status: Home / Self Care | Payer: Medicare Other | Source: Ambulatory Visit | Attending: Cardiology | Admitting: Cardiology

## 2016-03-24 DIAGNOSIS — I429 Cardiomyopathy, unspecified: Secondary | ICD-10-CM

## 2016-03-24 DIAGNOSIS — Z8673 Personal history of transient ischemic attack (TIA), and cerebral infarction without residual deficits: Secondary | ICD-10-CM | POA: Insufficient documentation

## 2016-03-24 DIAGNOSIS — I10 Essential (primary) hypertension: Secondary | ICD-10-CM

## 2016-03-24 DIAGNOSIS — I340 Nonrheumatic mitral (valve) insufficiency: Secondary | ICD-10-CM | POA: Diagnosis not present

## 2016-03-24 DIAGNOSIS — K219 Gastro-esophageal reflux disease without esophagitis: Secondary | ICD-10-CM | POA: Insufficient documentation

## 2016-03-24 DIAGNOSIS — E119 Type 2 diabetes mellitus without complications: Secondary | ICD-10-CM | POA: Diagnosis not present

## 2016-03-24 LAB — ECHOCARDIOGRAM COMPLETE
AO mean calculated velocity dopler: 122 cm/s
AV Area VTI index: 0.78 cm2/m2
AV Area VTI: 1.51 cm2
AV Area mean vel: 1.56 cm2
AV Mean grad: 7 mmHg
AV Peak grad: 16 mmHg
AV VEL mean LVOT/AV: 0.55
AV area mean vel ind: 0.8 cm2/m2
AV peak Index: 0.77
AV pk vel: 199 cm/s
AV vel: 1.52
Ao pk vel: 0.53 m/s
E decel time: 261 msec
E/e' ratio: 17.65
FS: 44 % (ref 28–44)
IVS/LV PW RATIO, ED: 1.06
LA ID, A-P, ES: 36 mm
LA diam end sys: 36 mm
LA diam index: 1.84 cm/m2
LA vol A4C: 63.4 ml
LA vol index: 28.1 mL/m2
LA vol: 55 mL
LV E/e' medial: 17.65
LV E/e'average: 17.65
LV PW d: 12.3 mm — AB (ref 0.6–1.1)
LV dias vol index: 32 mL/m2
LV dias vol: 62 mL (ref 46–106)
LV e' LATERAL: 5.44 cm/s
LV sys vol index: 12 mL/m2
LV sys vol: 24 mL
LVOT SV: 82 mL
LVOT VTI: 28.7 cm
LVOT area: 2.84 cm2
LVOT diameter: 19 mm
LVOT peak VTI: 0.54 cm
LVOT peak grad rest: 4 mmHg
LVOT peak vel: 106 cm/s
Lateral S' vel: 11 cm/s
MV Dec: 261
MV Peak grad: 4 mmHg
MV pk A vel: 112 m/s
MV pk E vel: 96 m/s
Simpson's disk: 62
Stroke v: 39 ml
TAPSE: 24.4 mm
TDI e' lateral: 5.44
TDI e' medial: 5.11
VTI: 53.5 cm
Valve area index: 0.78
Valve area: 1.52 cm2

## 2016-03-24 NOTE — Progress Notes (Signed)
*  PRELIMINARY RESULTS* Echocardiogram 2D Echocardiogram has been performed.  [name] 03/24/2016, 12:58 PM

## 2016-03-28 ENCOUNTER — Telehealth: Payer: Self-pay | Admitting: *Deleted

## 2016-03-28 NOTE — Telephone Encounter (Signed)
-----   Message from Vennie Homans sent at 03/27/2016  2:30 PM EST -----   ----- Message ----- From: Minus Breeding, MD Sent: 03/26/2016   6:02 PM To: Vennie Homans  EF is now normal!!!    Call Ms. Harmening with the results and send results to Redge Gainer, MD

## 2016-03-29 ENCOUNTER — Telehealth: Payer: Self-pay | Admitting: Cardiology

## 2016-03-29 NOTE — Telephone Encounter (Signed)
See previous phone note for details 

## 2016-03-29 NOTE — Telephone Encounter (Signed)
Patient informed and copy sent to PCP. 

## 2016-03-29 NOTE — Telephone Encounter (Signed)
New message ° ° ° ° °Returning a call to the nurse °

## 2016-05-03 ENCOUNTER — Encounter: Payer: Self-pay | Admitting: Cardiology

## 2016-05-04 ENCOUNTER — Ambulatory Visit (INDEPENDENT_AMBULATORY_CARE_PROVIDER_SITE_OTHER): Payer: Medicare Other

## 2016-05-04 ENCOUNTER — Encounter: Payer: Self-pay | Admitting: Family

## 2016-05-04 ENCOUNTER — Ambulatory Visit (INDEPENDENT_AMBULATORY_CARE_PROVIDER_SITE_OTHER): Payer: Medicare Other | Admitting: Family

## 2016-05-04 VITALS — BP 138/76 | HR 59 | Temp 96.7°F | Ht 64.0 in | Wt 179.0 lb

## 2016-05-04 DIAGNOSIS — M1712 Unilateral primary osteoarthritis, left knee: Secondary | ICD-10-CM

## 2016-05-04 DIAGNOSIS — M25562 Pain in left knee: Secondary | ICD-10-CM | POA: Diagnosis not present

## 2016-05-04 MED ORDER — TRAMADOL HCL 50 MG PO TABS: 50.0000 mg | ORAL_TABLET | Freq: Two times a day (BID) | ORAL | 3 refills | Status: DC | PRN

## 2016-05-04 MED ORDER — DICLOFENAC SODIUM 75 MG PO TBEC: 75.0000 mg | DELAYED_RELEASE_TABLET | Freq: Two times a day (BID) | ORAL | 3 refills | Status: DC

## 2016-05-04 NOTE — Patient Instructions (Signed)
Arthritis Introduction Arthritis is a term that is commonly used to refer to joint pain or joint disease. There are more than 100 types of arthritis. What are the causes? The most common cause of this condition is wear and tear of a joint. Other causes include:  Gout.  Inflammation of a joint.  An infection of a joint.  Sprains and other injuries near the joint.  A drug reaction or allergic reaction. In some cases, the cause may not be known. What are the signs or symptoms? The main symptom of this condition is pain in the joint with movement. Other symptoms include:  Redness, swelling, or stiffness at a joint.  Warmth coming from the joint.  Fever.  Overall feeling of illness. How is this diagnosed? This condition may be diagnosed with a physical exam and tests, including:  Blood tests.  Urine tests.  Imaging tests, such as MRI, X-rays, or a CT scan. Sometimes, fluid is removed from a joint for testing. How is this treated? Treatment for this condition may involve:  Treatment of the cause, if it is known.  Rest.  Raising (elevating) the joint.  Applying cold or hot packs to the joint.  Medicines to improve symptoms and reduce inflammation.  Injections of a steroid such as cortisone into the joint to help reduce pain and inflammation. Depending on the cause of your arthritis, you may need to make lifestyle changes to reduce stress on your joint. These changes may include exercising more and losing weight. Follow these instructions at home: Medicines  Take over-the-counter and prescription medicines only as told by your health care provider.  Do not take aspirin to relieve pain if gout is suspected. Activity  Rest your joint if told by your health care provider. Rest is important when your disease is active and your joint feels painful, swollen, or stiff.  Avoid activities that make the pain worse. It is important to balance activity with rest.  Exercise  your joint regularly with range-of-motion exercises as told by your health care provider. Try doing low-impact exercise, such as:  Swimming.  Water aerobics.  Biking.  Walking. Joint Care   If your joint is swollen, keep it elevated if told by your health care provider.  If your joint feels stiff in the morning, try taking a warm shower.  If directed, apply heat to the joint. If you have diabetes, do not apply heat without permission from your health care provider.  Put a towel between the joint and the hot pack or heating pad.  Leave the heat on the area for 20-30 minutes.  If directed, apply ice to the joint:  Put ice in a plastic bag.  Place a towel between your skin and the bag.  Leave the ice on for 20 minutes, 2-3 times per day.  Keep all follow-up visits as told by your health care provider. This is important. Contact a health care provider if:  The pain gets worse.  You have a fever. Get help right away if:  You develop severe joint pain, swelling, or redness.  Many joints become painful and swollen.  You develop severe back pain.  You develop severe weakness in your leg.  You cannot control your bladder or bowels. This information is not intended to replace advice given to you by your health care provider. Make sure you discuss any questions you have with your health care provider. Document Released: 05/04/2004 Document Revised: 09/02/2015 Document Reviewed: 06/22/2014  2017 Elsevier  

## 2016-05-04 NOTE — Progress Notes (Signed)
   Subjective:    Patient ID: [name], female    DOB: 1946/03/31, 71 y.o.   MRN: [id number]  HPI PT presents to the office today with left leg/knee pain. PT states she had a knee replacement in 2006. PT states the intermittent aching pain of 8 out 10 and standing and steps make the pain worse. PT takes OTC medication with mild relief. Pt states she had Ultram, but has not used it because it makes her "sleepy".    Review of Systems  Musculoskeletal: Positive for joint swelling.  All other systems reviewed and are negative.      Objective:   Physical Exam  Constitutional: She is oriented to person, place, and time. She appears well-developed and well-nourished. No distress.  HENT:  Head: Normocephalic.  Eyes: Pupils are equal, round, and reactive to light.  Neck: Normal range of motion. Neck supple. No thyromegaly present.  Cardiovascular: Normal rate, regular rhythm, normal heart sounds and intact distal pulses.   No murmur heard. Pulmonary/Chest: Effort normal and breath sounds normal. No respiratory distress. She has no wheezes.  Abdominal: Soft. Bowel sounds are normal. She exhibits no distension. There is no tenderness.  Musculoskeletal: Normal range of motion. She exhibits edema (trace in left knee) and tenderness (in knee with flexion).  Neurological: She is alert and oriented to person, place, and time.  Skin: Skin is warm and dry.  Psychiatric: She has a normal mood and affect. Her behavior is normal. Judgment and thought content normal.  Vitals reviewed.   BP 138/76   Pulse (!) 59   Temp (!) 96.7 F (35.9 C) (Oral)   Ht 5\' 4"  (1.626 m)   Wt 179 lb (81.2 kg)   BMI 30.73 kg/m       Assessment & Plan:  1. Left knee pain, unspecified chronicity - traMADol (ULTRAM) 50 MG tablet; Take 1 tablet (50 mg total) by mouth every 12 (twelve) hours as needed.  Dispense: 60 tablet; Refill: 3 - diclofenac (VOLTAREN) 75 MG EC tablet; Take 1 tablet (75 mg total) by mouth 2  (two) times daily.  Dispense: 60 tablet; Refill: 3 - DG Knee 1-2 Views Left; Future  2. Osteoarthritis of left knee, unspecified osteoarthritis type - traMADol (ULTRAM) 50 MG tablet; Take 1 tablet (50 mg total) by mouth every 12 (twelve) hours as needed.  Dispense: 60 tablet; Refill: 3 - diclofenac (VOLTAREN) 75 MG EC tablet; Take 1 tablet (75 mg total) by mouth 2 (two) times daily.  Dispense: 60 tablet; Refill: 3 - DG Knee 1-2 Views Left; Future  Rest  ROM exercises discussed RTO Prn and schedule chronic follow up , will do ortho referral depending on x-ray  Evelina Dun, FNP

## 2016-05-05 ENCOUNTER — Telehealth: Payer: Self-pay | Admitting: Family Medicine

## 2016-05-05 DIAGNOSIS — M1712 Unilateral primary osteoarthritis, left knee: Secondary | ICD-10-CM

## 2016-05-05 DIAGNOSIS — M25562 Pain in left knee: Secondary | ICD-10-CM

## 2016-05-05 NOTE — Telephone Encounter (Signed)
Aware, knee xray has not been resulted. Will call when it comes in.

## 2016-05-09 MED ORDER — TRAMADOL HCL 50 MG PO TABS: 50.0000 mg | ORAL_TABLET | Freq: Two times a day (BID) | ORAL | 3 refills | Status: DC | PRN

## 2016-05-09 NOTE — Telephone Encounter (Signed)
Script of tramadol was called to [facility].

## 2016-05-09 NOTE — Telephone Encounter (Signed)
Patient can not find a script for tramadol although our records show one was printed.  I reviewed knee results and she says her pain is still very bad. She is hoping you will give her pain medication, ([name]), and advice and what else she can do.

## 2016-05-09 NOTE — Telephone Encounter (Signed)
Please call in Ultram rx to pharmacy. Thanks!

## 2016-05-09 NOTE — Telephone Encounter (Signed)
Left message for patient to call if further problems with knee and pain medication script is at [facility].

## 2016-05-23 ENCOUNTER — Other Ambulatory Visit: Payer: Self-pay

## 2016-05-23 MED ORDER — SACUBITRIL-VALSARTAN 97-103 MG PO TABS: 1.0000 | ORAL_TABLET | Freq: Two times a day (BID) | ORAL | 6 refills | Status: DC

## 2016-06-13 ENCOUNTER — Ambulatory Visit (INDEPENDENT_AMBULATORY_CARE_PROVIDER_SITE_OTHER): Payer: Medicare Other | Admitting: Family

## 2016-06-13 VITALS — BP 178/98 | HR 58 | Temp 96.8°F | Ht 64.0 in | Wt 180.0 lb

## 2016-06-13 DIAGNOSIS — M1712 Unilateral primary osteoarthritis, left knee: Secondary | ICD-10-CM

## 2016-06-13 DIAGNOSIS — M25562 Pain in left knee: Secondary | ICD-10-CM

## 2016-06-13 DIAGNOSIS — G8929 Other chronic pain: Secondary | ICD-10-CM

## 2016-06-13 DIAGNOSIS — M25462 Effusion, left knee: Secondary | ICD-10-CM

## 2016-06-13 MED ORDER — ESCITALOPRAM OXALATE 10 MG PO TABS: 10.0000 mg | ORAL_TABLET | Freq: Every day | ORAL | 3 refills | Status: DC

## 2016-06-13 MED ORDER — METHYLPREDNISOLONE ACETATE 40 MG/ML IJ SUSP
40.0000 mg | Freq: Once | INTRAMUSCULAR | Status: AC
  Administered 2016-06-13: 40 mg via INTRA_ARTICULAR

## 2016-06-13 MED ORDER — DICLOFENAC SODIUM 75 MG PO TBEC
75.0000 mg | DELAYED_RELEASE_TABLET | Freq: Two times a day (BID) | ORAL | 3 refills | Status: DC
Start: 2016-06-13 — End: 2018-01-22

## 2016-06-13 MED ORDER — BUPIVACAINE HCL 0.25 % IJ SOLN
1.0000 mL | Freq: Once | INTRAMUSCULAR | Status: AC
  Administered 2016-06-13: 1 mL via INTRA_ARTICULAR

## 2016-06-13 NOTE — Progress Notes (Signed)
   Subjective:    Patient ID: [name], female    DOB: [birth year], 71 y.o.   MRN: [id number]  HPI PT presents to the office today with chronic left leg/knee pain. PT states she had a knee replacement in 2006. PT states the intermittent aching pain of 11 out 10 and standing and steps make the pain worse. PT takes OTC medication with mild relief. Pt states the Ultram seem to give her slight relief. Denies any injury.  PT was seen in the office on 05/04/16 and was given Voltaren and Ultram. Pt states the pain is unchanged and she has pain with any extension or flexion.     Review of Systems  Musculoskeletal: Positive for joint swelling.  All other systems reviewed and are negative.      Objective:   Physical Exam  Constitutional: She is oriented to person, place, and time. She appears well-developed and well-nourished. No distress.  HENT:  Head: Normocephalic.  Eyes: Pupils are equal, round, and reactive to light.  Neck: Normal range of motion. Neck supple. No thyromegaly present.  Cardiovascular: Normal rate, regular rhythm, normal heart sounds and intact distal pulses.   No murmur heard. Pulmonary/Chest: Effort normal and breath sounds normal. No respiratory distress. She has no wheezes.  Abdominal: Soft. Bowel sounds are normal. She exhibits no distension. There is no tenderness.  Musculoskeletal: Normal range of motion. She exhibits edema (trace in left knee) and tenderness (in knee with flexion and extension).  Neurological: She is alert and oriented to person, place, and time.  Skin: Skin is warm and dry.  Psychiatric: She has a normal mood and affect. Her behavior is normal. Judgment and thought content normal.  Vitals reviewed.   BP (!) 178/98   Pulse (!) 58   Temp (!) 96.8 F (36 C) (Oral)   Ht 5\' 4"  (1.626 m)   Wt 180 lb (81.6 kg)   BMI 30.90 kg/m   leftknee prepped with betadine Injected with Marcaine .5% plain and methylprednisolone with 22 guage needle x  1. Patient tolerated well.     Assessment & Plan:  1. Chronic pain of left knee - AMB referral to orthopedics - methylPREDNISolone acetate (DEPO-MEDROL) injection 40 mg; Inject 1 mL (40 mg total) into the articular space once. - bupivacaine (MARCAINE) 0.25 % (with pres) injection 1 mL; Inject 1 mL into the articular space once.  2. Primary osteoarthritis of left knee - AMB referral to orthopedics - methylPREDNISolone acetate (DEPO-MEDROL) injection 40 mg; Inject 1 mL (40 mg total) into the articular space once. - bupivacaine (MARCAINE) 0.25 % (with pres) injection 1 mL; Inject 1 mL into the articular space once.  3. Knee effusion, left - AMB referral to orthopedics - methylPREDNISolone acetate (DEPO-MEDROL) injection 40 mg; Inject 1 mL (40 mg total) into the articular space once. - bupivacaine (MARCAINE) 0.25 % (with pres) injection 1 mL; Inject 1 mL into the articular space once.  Rest Ice Referral to Ortho pending RTO prn   Evelina Dun, FNP

## 2016-06-13 NOTE — Patient Instructions (Signed)

## 2016-08-29 IMAGING — CT CT HEAD W/O CM
2 series · 15 of 30 positions shown, 19 images · non-contrast
Comparison: Brain MRI, 04/25/2010.  Head CT, 04/24/2010

CLINICAL DATA: Code stroke.  Right-sided weakness.

EXAM:
CT HEAD WITHOUT CONTRAST
TECHNIQUE: Contiguous axial images were obtained from the base of the skull
through the vertex without intravenous contrast.

[Series 201: head w/o, idose (1) · axial · non-contrast · 0.49mm/px · z∈[+115,+245]mm · 13 of 32 slices shown, 17 images]
[im 3/32  brain]
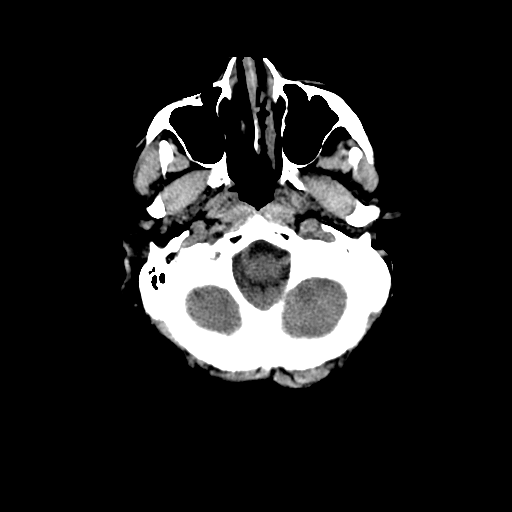
[im 3/32  bone]
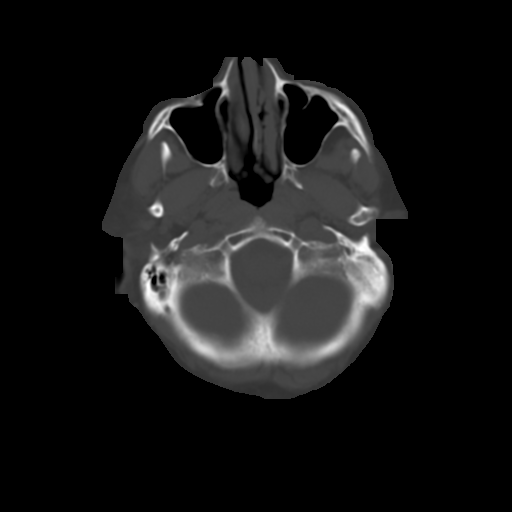
[im 5/32  brain]
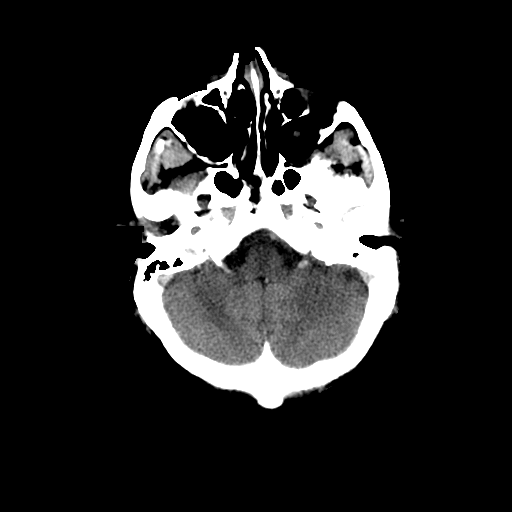
[im 7/32  brain]
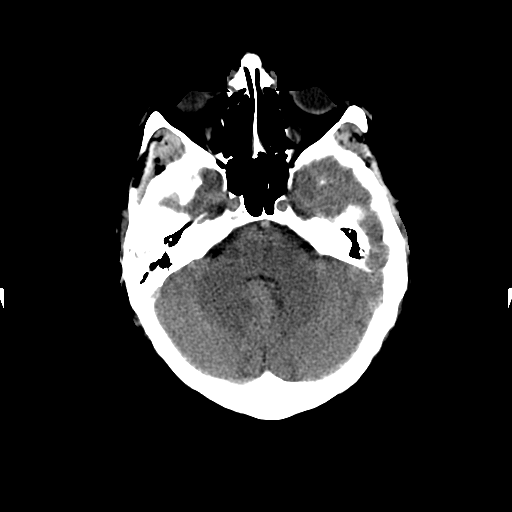
[im 9/32  brain]
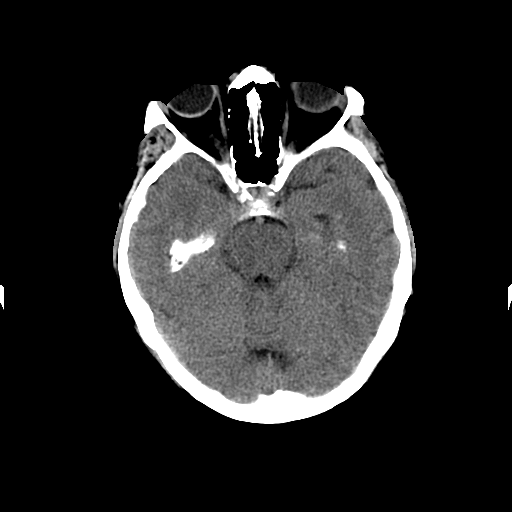
[im 12/32  brain]
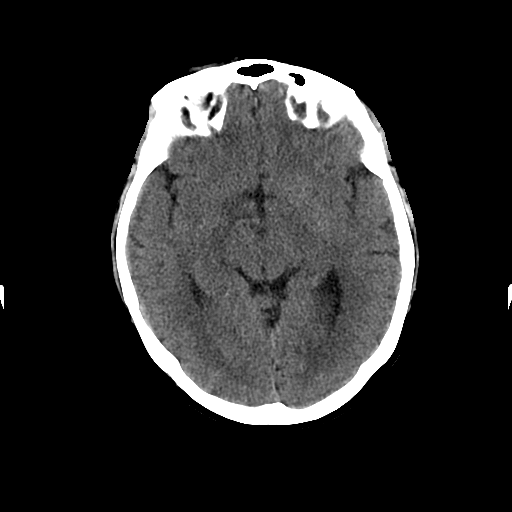
[im 12/32  bone]
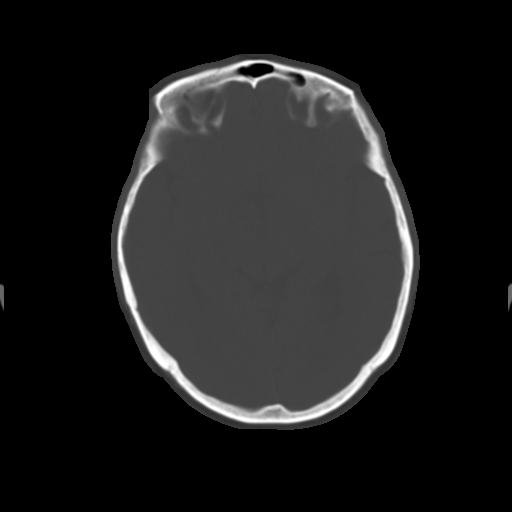
[im 14/32  brain]
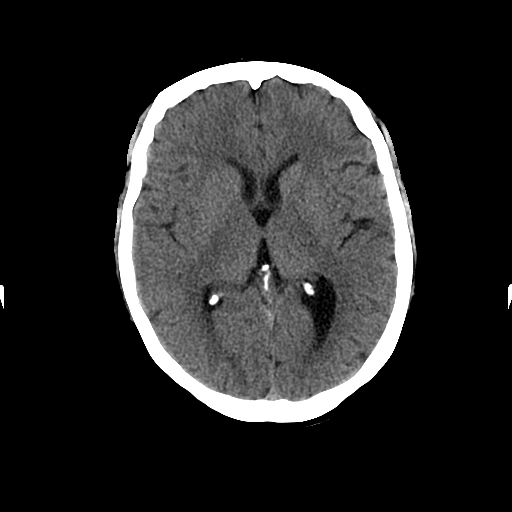
[im 16/32  brain]
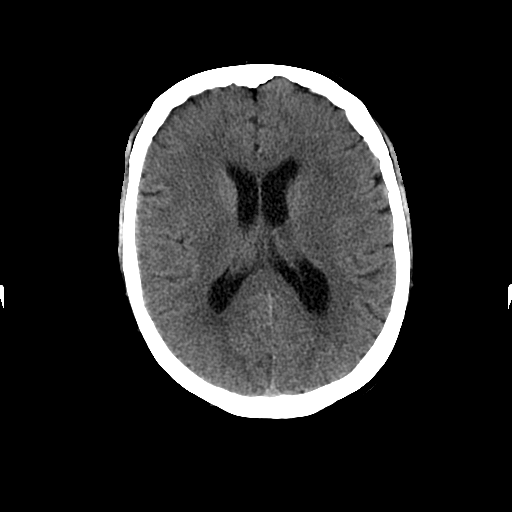
[im 18/32  brain]
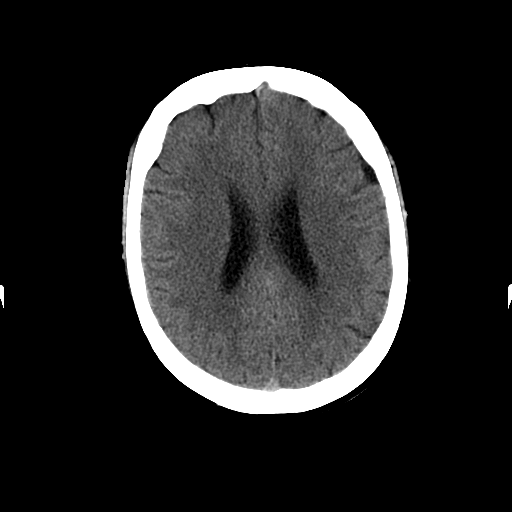
[im 20/32  brain]
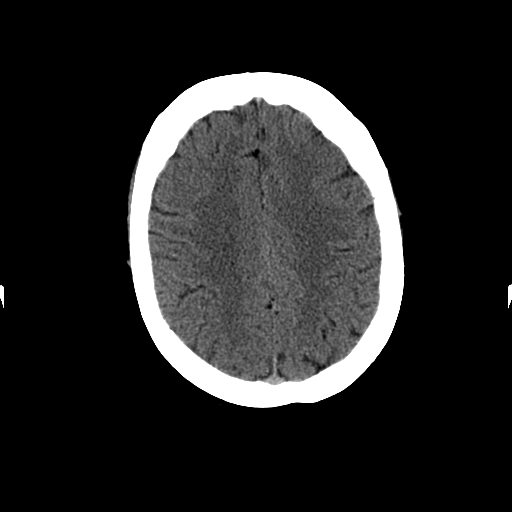
[im 20/32  bone]
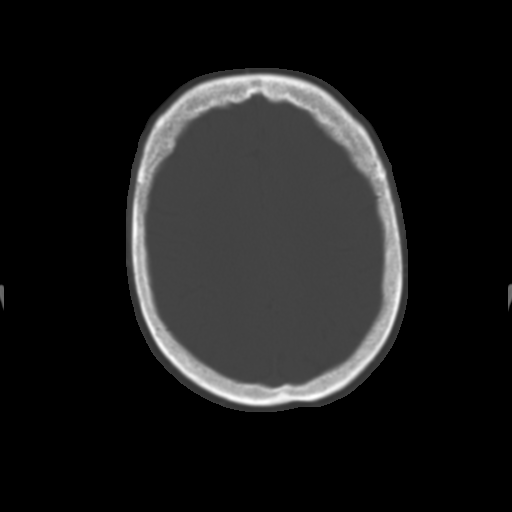
[im 23/32  brain]
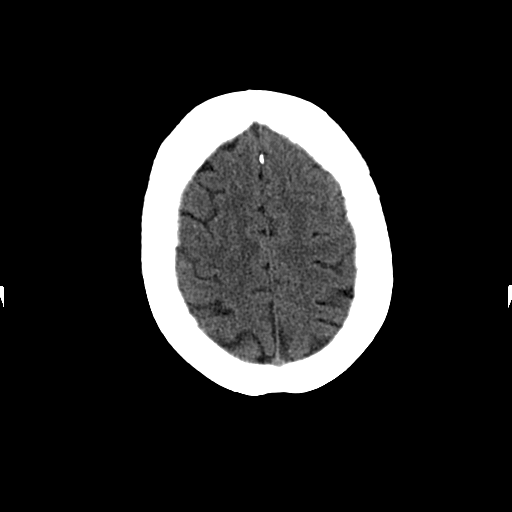
[im 25/32  brain]
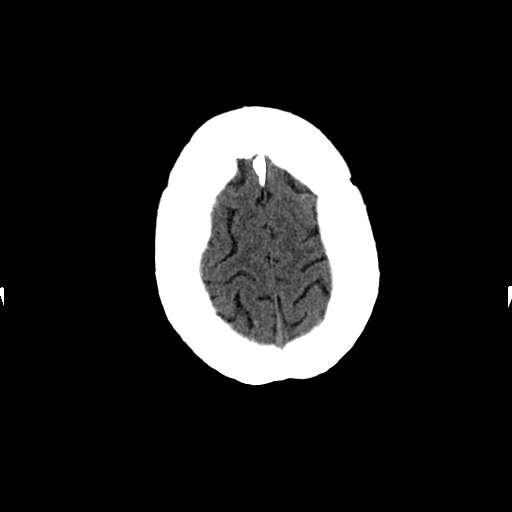
[im 27/32  brain]
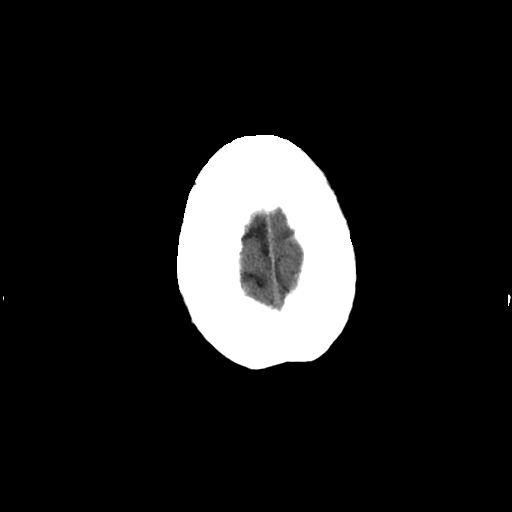
[im 29/32  brain]
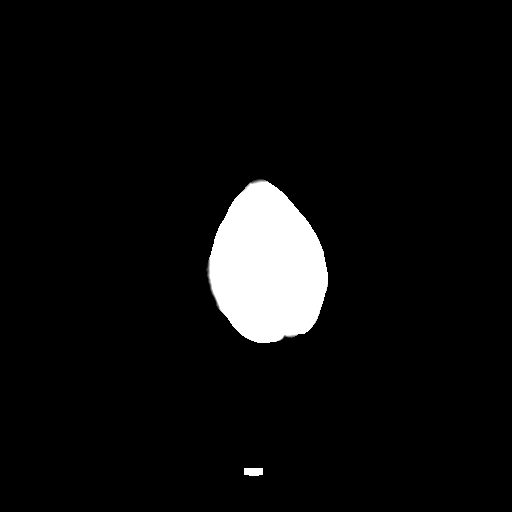
[im 29/32  bone]
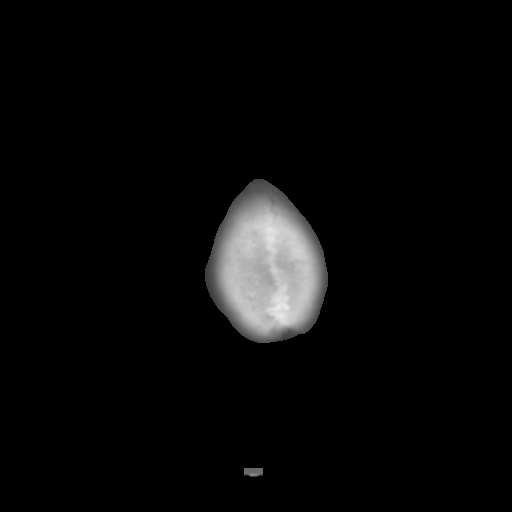

[Series 202: head w/o bone, idose (1) · axial · non-contrast · 0.49mm/px · z∈[+115,+135]mm · 2 of 32 slices shown]
[im 3/32  bone]
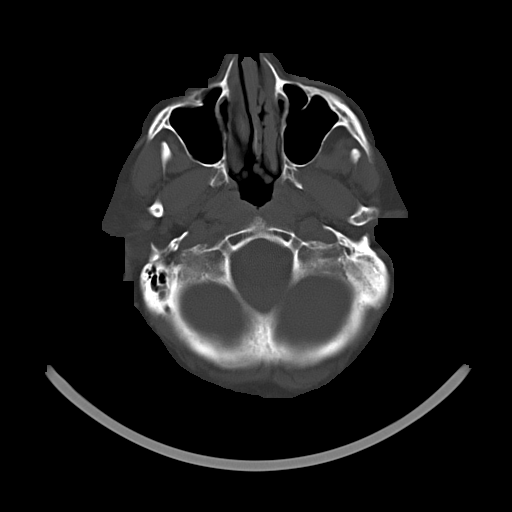
[im 7/32  bone]
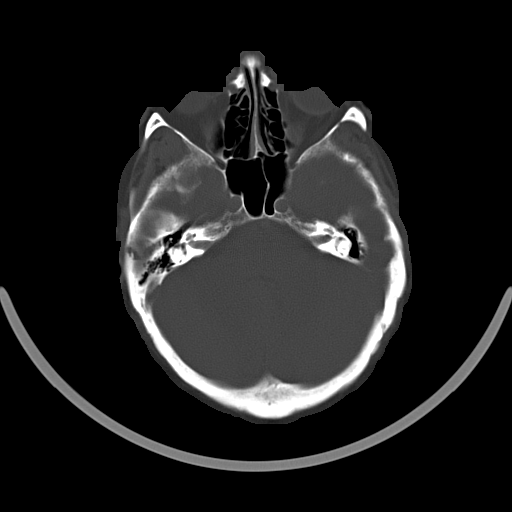

[15 of 30 positions shown; findings below may reference images not displayed]

FINDINGS: Ventricles are normal in size and configuration.

There are no parenchymal masses or mass effect. There is no evidence
of an infarct. Mild periventricular white matter hypoattenuation is
noted consistent with chronic microvascular ischemic change, similar
to the prior study.

No extra-axial masses or abnormal fluid collections.

No intracranial hemorrhage.

Visualized sinuses and mastoid air cells are clear. No skull lesion.
IMPRESSION: 1. No acute intracranial abnormalities.
2. Mild chronic microvascular ischemic change similar to the prior
exams.
These results were called by telephone at the time of interpretation
on 12/16/2013 at [DATE] to Dr. KAMATO HAMROUNI , who verbally
acknowledged these results.

## 2016-09-12 ENCOUNTER — Telehealth: Payer: Self-pay | Admitting: Family Medicine

## 2017-01-21 NOTE — Progress Notes (Signed)
HPI  The patient presents for follow up of dyspnea.  Was previously found to have an EF of 25%.   Her meds were titrated and her EF in Dec of last year demonstrated a normal EF. Since I last saw her she has done well.  The patient denies any new symptoms such as chest discomfort, neck or arm discomfort. There has been no new shortness of breath, PND or orthopnea. There have been no reported palpitations, presyncope or syncope.  She is still working.   .   Allergies  Allergen Reactions  . Levaquin [Levofloxacin In D5w] Diarrhea  . Penicillins Rash    Current Outpatient Prescriptions  Medication Sig Dispense Refill  . albuterol (PROVENTIL HFA;VENTOLIN HFA) 108 (90 Base) MCG/ACT inhaler Inhale 2 puffs into the lungs every 6 (six) hours as needed. 1 Inhaler 6  . amLODipine (NORVASC) 2.5 MG tablet Take 1 tablet (2.5 mg total) by mouth daily. 90 tablet 3  . aspirin 81 MG chewable tablet Chew by mouth daily.    . bisoprolol (ZEBETA) 10 MG tablet TAKE ONE TABLET BY MOUTH ONCE DAILY 30 tablet 11  . diclofenac (VOLTAREN) 75 MG EC tablet Take 1 tablet (75 mg total) by mouth 2 (two) times daily. 60 tablet 3  . escitalopram (LEXAPRO) 10 MG tablet Take 1 tablet (10 mg total) by mouth daily. 90 tablet 3  . fluticasone (FLONASE) 50 MCG/ACT nasal spray Place 2 sprays into both nostrils daily. 16 g 6  . furosemide (LASIX) 20 MG tablet Take 1 tablet (20 mg total) by mouth as needed. Take one tablet for two days. Hold other tablets for further use as directed 30 tablet 0  . glucose blood (ONETOUCH VERIO) test strip Use as instructed 100 each 12  . metFORMIN (GLUCOPHAGE) 500 MG tablet TAKE ONE TABLET BY MOUTH TWICE DAILY WITH  A  MEAL 180 tablet 0  . ONETOUCH DELICA LANCETS FINE MISC Use to check BG once daily 100 each 2  . sacubitril-valsartan (ENTRESTO) 97-103 MG Take 1 tablet by mouth 2 (two) times daily. 60 tablet 6  . spironolactone (ALDACTONE) 50 MG tablet Take 1 tablet (50 mg total) by mouth daily. 90  tablet 3  . traMADol (ULTRAM) 50 MG tablet Take 1 tablet (50 mg total) by mouth every 12 (twelve) hours as needed. 60 tablet 3   No current facility-administered medications for this visit.     Past Medical History:  Diagnosis Date  . Asthma   . Chronic systolic heart failure ([place])    Echo (08/26/13):  Mild LVH. EF 25% to 30%. Diffuse HK. Aortic valve: There was trivial regurgitation. Mitral valve: There was mild regurgitation. Left atrium: The atrium was mildly dilated.  Marland Kitchen GERD (gastroesophageal reflux disease)   . Headache(784.0)   . History of nephrolithiasis   . Hypertension   . NICM (nonischemic cardiomyopathy) ([place])    a. LHC (08/2013):  no CAD  . Stroke ([place])   . Type II or unspecified type diabetes mellitus without mention of complication, uncontrolled    borderline    Past Surgical History:  Procedure Laterality Date  . ABDOMINAL HYSTERECTOMY    . LEFT AND RIGHT HEART CATHETERIZATION WITH CORONARY ANGIOGRAM N/A 09/03/2013   Procedure: LEFT AND RIGHT HEART CATHETERIZATION WITH CORONARY ANGIOGRAM;  Surgeon: [name], MD;  Location: [facility] CATH LAB;  Service: Cardiovascular;  Laterality: N/A;  . OOPHORECTOMY     removed 1 ovary and 1 ovary remains.  . ureteral extraction of kidney  stone      ROS:    As stated in the HPI and negative for all other systems.  PHYSICAL EXAM BP 126/70 (BP Location: Right Arm, Patient Position: Sitting, Cuff Size: Normal)   Pulse 64   Ht 5\' 4"  (1.626 m)   Wt 184 lb (83.5 kg)   LMP 01/22/1981 (LMP Unknown)   BMI 31.58 kg/m   GENERAL:  Well appearing NECK:  No jugular venous distention, waveform within normal limits, carotid upstroke brisk and symmetric, right carotid bruit, no thyromegaly LUNGS:  Clear to auscultation bilaterally CHEST:  Unremarkable HEART:  PMI not displaced or sustained,S1 and S2 within normal limits, no S3, no S4, no clicks, no rubs, 2/6 brief apical systolic murmur heard best out the left upper outflow tract,  no diastolic murmurs. ABD:  Flat, positive bowel sounds normal in frequency in pitch, no bruits, no rebound, no guarding, no midline pulsatile mass, no hepatomegaly, no splenomegaly EXT:  2 plus pulses throughout, no edema, no cyanosis no clubbing   EKG:  Sinus rhythm, rate 64, left axis deviation, left anterior fascicular block, premature ventricular contraction, anterolateral T wave inversions unchanged from previous, poor anterior R wave progression.   ASSESSMENT AND PLAN  CARDIOMYOPATHY:    EF was normal on echo last year.  She has no new symptoms.  No change in therapy is indicated.  No further imaging is planned at this point.   HTN:  The blood pressure is at target. No change in medications is indicated. We will continue with therapeutic lifestyle changes (TLC).  ABNORMAL EKG:  This is unchanged from previous.  I gave her a copy of this today to keep with her.   BRUIT:  She has a bruit on the right carotid.  I will check a Doppler.

## 2017-01-22 ENCOUNTER — Encounter: Payer: Self-pay | Admitting: Cardiology

## 2017-01-22 ENCOUNTER — Ambulatory Visit (INDEPENDENT_AMBULATORY_CARE_PROVIDER_SITE_OTHER): Payer: Medicare Other | Admitting: Cardiology

## 2017-01-22 VITALS — BP 126/70 | HR 64 | Ht 64.0 in | Wt 184.0 lb

## 2017-01-22 DIAGNOSIS — R0600 Dyspnea, unspecified: Secondary | ICD-10-CM | POA: Diagnosis not present

## 2017-01-22 DIAGNOSIS — I5022 Chronic systolic (congestive) heart failure: Secondary | ICD-10-CM

## 2017-01-22 DIAGNOSIS — R0989 Other specified symptoms and signs involving the circulatory and respiratory systems: Secondary | ICD-10-CM

## 2017-01-22 DIAGNOSIS — I10 Essential (primary) hypertension: Secondary | ICD-10-CM

## 2017-01-22 DIAGNOSIS — Z79899 Other long term (current) drug therapy: Secondary | ICD-10-CM

## 2017-01-22 LAB — CBC
Hematocrit: 38.4 % (ref 34.0–46.6)
Hemoglobin: 13.1 g/dL (ref 11.1–15.9)
MCH: 30.1 pg (ref 26.6–33.0)
MCHC: 34.1 g/dL (ref 31.5–35.7)
MCV: 88 fL (ref 79–97)
Platelets: 245 10*3/uL (ref 150–379)
RBC: 4.35 x10E6/uL (ref 3.77–5.28)
RDW: 13.7 % (ref 12.3–15.4)
WBC: 8 10*3/uL (ref 3.4–10.8)

## 2017-01-22 LAB — BASIC METABOLIC PANEL
BUN/Creatinine Ratio: 24 (ref 12–28)
BUN: 23 mg/dL (ref 8–27)
CO2: 24 mmol/L (ref 20–29)
Calcium: 9.3 mg/dL (ref 8.7–10.3)
Chloride: 103 mmol/L (ref 96–106)
Creatinine, Ser: 0.94 mg/dL (ref 0.57–1.00)
GFR calc Af Amer: 70 mL/min/{1.73_m2} (ref >59)
GFR calc non Af Amer: 61 mL/min/{1.73_m2} (ref >59)
Glucose: 272 mg/dL — ABNORMAL HIGH (ref 65–99)
Potassium: 3.6 mmol/L (ref 3.5–5.2)
Sodium: 142 mmol/L (ref 134–144)

## 2017-01-22 NOTE — Patient Instructions (Addendum)
Medication Instructions:  Continue current mediations  If you need a refill on your cardiac medications before your next appointment, please call your pharmacy.  Labwork: BMP and CBC HERE IN OUR OFFICE AT [facility]  Testing/Procedures: Your physician has requested that you have a carotid duplex. This test is an ultrasound of the carotid arteries in your neck. It looks at blood flow through these arteries that supply the brain with blood. Allow one hour for this exam. There are no restrictions or special instructions.  Follow-Up: Your physician wants you to follow-up in: 1 Year. You should receive a reminder letter in the mail two months in advance. If you do not receive a letter, please call our office [phone].    Thank you for choosing [facility] at [facility]!!

## 2017-01-23 ENCOUNTER — Encounter: Payer: Self-pay | Admitting: Cardiology

## 2017-02-05 ENCOUNTER — Ambulatory Visit (HOSPITAL_COMMUNITY)
Admission: RE | Admit: 2017-02-05 | Payer: Medicare Other | Source: Ambulatory Visit | Attending: Cardiology | Admitting: Cardiology

## 2017-02-19 ENCOUNTER — Ambulatory Visit (HOSPITAL_COMMUNITY)
Admission: RE | Admit: 2017-02-19 | Discharge: 2017-02-19 | Disposition: A | Discharge status: Home / Self Care | Payer: Medicare Other | Source: Ambulatory Visit | Attending: Cardiology | Admitting: Cardiology

## 2017-02-19 DIAGNOSIS — R0989 Other specified symptoms and signs involving the circulatory and respiratory systems: Secondary | ICD-10-CM | POA: Diagnosis not present

## 2017-05-11 ENCOUNTER — Telehealth: Payer: Self-pay | Admitting: Family

## 2017-06-02 ENCOUNTER — Other Ambulatory Visit: Payer: Self-pay | Admitting: Cardiology

## 2017-06-02 ENCOUNTER — Other Ambulatory Visit: Payer: Self-pay | Admitting: Family

## 2017-06-02 DIAGNOSIS — M1712 Unilateral primary osteoarthritis, left knee: Secondary | ICD-10-CM

## 2017-06-02 DIAGNOSIS — M25562 Pain in left knee: Secondary | ICD-10-CM

## 2017-06-04 NOTE — Telephone Encounter (Signed)
REFILL 

## 2017-07-06 ENCOUNTER — Other Ambulatory Visit: Payer: Self-pay | Admitting: Cardiology

## 2017-10-08 ENCOUNTER — Telehealth: Payer: Self-pay | Admitting: Family

## 2018-01-07 DIAGNOSIS — Z1231 Encounter for screening mammogram for malignant neoplasm of breast: Secondary | ICD-10-CM | POA: Diagnosis not present

## 2018-01-07 LAB — HM MAMMOGRAPHY

## 2018-01-22 ENCOUNTER — Ambulatory Visit (INDEPENDENT_AMBULATORY_CARE_PROVIDER_SITE_OTHER): Payer: Medicare Other | Admitting: Family

## 2018-01-22 ENCOUNTER — Ambulatory Visit (INDEPENDENT_AMBULATORY_CARE_PROVIDER_SITE_OTHER): Payer: Medicare Other

## 2018-01-22 ENCOUNTER — Encounter: Payer: Self-pay | Admitting: Family

## 2018-01-22 VITALS — BP 168/82 | HR 89 | Temp 98.3°F | Ht 64.0 in | Wt 176.0 lb

## 2018-01-22 DIAGNOSIS — E559 Vitamin D deficiency, unspecified: Secondary | ICD-10-CM | POA: Diagnosis not present

## 2018-01-22 DIAGNOSIS — I10 Essential (primary) hypertension: Secondary | ICD-10-CM | POA: Diagnosis not present

## 2018-01-22 DIAGNOSIS — M8588 Other specified disorders of bone density and structure, other site: Secondary | ICD-10-CM | POA: Diagnosis not present

## 2018-01-22 DIAGNOSIS — Z1212 Encounter for screening for malignant neoplasm of rectum: Secondary | ICD-10-CM

## 2018-01-22 DIAGNOSIS — Z23 Encounter for immunization: Secondary | ICD-10-CM | POA: Diagnosis not present

## 2018-01-22 DIAGNOSIS — E1165 Type 2 diabetes mellitus with hyperglycemia: Secondary | ICD-10-CM | POA: Diagnosis not present

## 2018-01-22 DIAGNOSIS — I5022 Chronic systolic (congestive) heart failure: Secondary | ICD-10-CM | POA: Diagnosis not present

## 2018-01-22 DIAGNOSIS — Z1159 Encounter for screening for other viral diseases: Secondary | ICD-10-CM | POA: Diagnosis not present

## 2018-01-22 DIAGNOSIS — J4520 Mild intermittent asthma, uncomplicated: Secondary | ICD-10-CM | POA: Diagnosis not present

## 2018-01-22 DIAGNOSIS — Z1211 Encounter for screening for malignant neoplasm of colon: Secondary | ICD-10-CM

## 2018-01-22 DIAGNOSIS — M8580 Other specified disorders of bone density and structure, unspecified site: Secondary | ICD-10-CM

## 2018-01-22 DIAGNOSIS — F321 Major depressive disorder, single episode, moderate: Secondary | ICD-10-CM

## 2018-01-22 DIAGNOSIS — K219 Gastro-esophageal reflux disease without esophagitis: Secondary | ICD-10-CM | POA: Diagnosis not present

## 2018-01-22 LAB — BAYER DCA HB A1C WAIVED: HB A1C (BAYER DCA - WAIVED): 7.7 % — ABNORMAL HIGH (ref <7.0)

## 2018-01-22 MED ORDER — ESCITALOPRAM OXALATE 20 MG PO TABS: 20.0000 mg | ORAL_TABLET | Freq: Every day | ORAL | 3 refills | Status: DC

## 2018-01-22 MED ORDER — AMLODIPINE BESYLATE 5 MG PO TABS: 5.0000 mg | ORAL_TABLET | Freq: Every day | ORAL | 3 refills | Status: DC

## 2018-01-22 NOTE — Progress Notes (Signed)
Subjective:    Patient ID: [name], female    DOB: 30-Apr-1945, 72 y.o.   MRN: [id number]  Chief Complaint  Patient presents with  . Medical Management of Chronic Issues  . Diabetes   PT presents to the office today for chronic follow up. She is followed by Cardiologists for  CHF. Diabetes  She presents for her follow-up diabetic visit. She has type 2 diabetes mellitus. Her disease course has been worsening. Pertinent negatives for diabetes include no blurred vision, no foot paresthesias and no visual change. Symptoms are worsening. Diabetic complications include a CVA. She is following a generally unhealthy diet. (Does not check BS at home ) Eye exam is not current.  Hypertension  This is a chronic problem. The current episode started more than 1 year ago. The problem has been waxing and waning since onset. The problem is uncontrolled. Associated symptoms include malaise/fatigue. Pertinent negatives include no blurred vision, peripheral edema or shortness of breath. Risk factors for coronary artery disease include dyslipidemia, diabetes mellitus and obesity. Hypertensive end-organ damage includes CVA and heart failure.  Hyperlipidemia  This is a chronic problem. The current episode started more than 1 year ago. The problem is controlled. Recent lipid tests were reviewed and are normal. Exacerbating diseases include obesity. Pertinent negatives include no shortness of breath. Current antihyperlipidemic treatment includes diet change. The current treatment provides no improvement of lipids. Risk factors for coronary artery disease include dyslipidemia, diabetes mellitus, hypertension, female sex, a sedentary lifestyle and post-menopausal.  Depression         This is a chronic problem.  The current episode started more than 1 year ago.   The onset quality is gradual.   The problem occurs intermittently.  The problem has been waxing and waning since onset.  Associated symptoms include  irritable, restlessness, decreased interest and sad.  Associated symptoms include no helplessness and no hopelessness.  Past treatments include SSRIs - Selective serotonin reuptake inhibitors.  Compliance with treatment is good. Asthma  She complains of frequent throat clearing and wheezing. There is no difficulty breathing or shortness of breath. This is a chronic problem. The current episode started more than 1 year ago. The problem occurs intermittently. The problem has been waxing and waning. Associated symptoms include malaise/fatigue. Her past medical history is significant for asthma.  Osteopenia  Pt's last dexascan was 07/29/14. Does not take any calcium or Vit D.   Review of Systems  Constitutional: Positive for malaise/fatigue.  Eyes: Negative for blurred vision.  Respiratory: Positive for wheezing. Negative for shortness of breath.   Psychiatric/Behavioral: Positive for depression.  All other systems reviewed and are negative.  Family History  Problem Relation Age of Onset  . Hypertension Mother   . Arthritis Mother   . CAD Mother 5  . Hip fracture Mother 41  . COPD Father   . Crohn's disease Sister   . Cancer Sister        lung  . Early death Brother 75       house fire  . Diabetes Maternal Grandmother   . Hypertension Sister   . Hypertension Brother     Social History   Socioeconomic History  . Marital status: Divorced    Spouse name: Not on file  . Number of children: 2  . Years of education: Not on file  . Highest education level: Not on file  Occupational History  . 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 Financial resource strain: Not on file  . Food insecurity:    Worry: Not on file    Inability: Not on file  . Transportation needs:    Medical: Not on file    Non-medical: Not on file  Tobacco Use  . Smoking status: Never Smoker  . Smokeless tobacco: Never Used  Substance and Sexual Activity  . Alcohol use:  Yes    Comment: rare  . Drug use: No  . Sexual activity: Never    Birth control/protection: Post-menopausal, Surgical  Lifestyle  . Physical activity:    Days per week: Not on file    Minutes per session: Not on file  . Stress: Not on file  Relationships  . Social connections:    Talks on phone: Not on file    Gets together: Not on file    Attends religious service: Not on file    Active member of club or organization: Not on file    Attends meetings of clubs or organizations: Not on file    Relationship status: Not on file  Other Topics Concern  . Not on file  Social History Narrative   Married "67-'8, divorced; remained single   1 son and 1 daughter   2 grandchildren   Left handed   No caffiene             Objective:   Physical Exam  Constitutional: She is oriented to person, place, and time. She appears well-developed and well-nourished. She is irritable. No distress.  HENT:  Head: Normocephalic and atraumatic.  Mouth/Throat: Oropharynx is clear and moist.  Bilateral cerumen impaction   Eyes: Pupils are equal, round, and reactive to light.  Neck: Normal range of motion. Neck supple. No thyromegaly present.  Cardiovascular: Normal rate, regular rhythm, normal heart sounds and intact distal pulses.  No murmur heard. Pulmonary/Chest: Effort normal and breath sounds normal. No respiratory distress. She has no wheezes.  Abdominal: Soft. Bowel sounds are normal. She exhibits no distension. There is no tenderness.  Musculoskeletal: Normal range of motion. She exhibits no edema or tenderness.  Neurological: She is alert and oriented to person, place, and time. She has normal reflexes. No cranial nerve deficit.  Skin: Skin is warm and dry.  Psychiatric: She has a normal mood and affect. Her behavior is normal. Judgment and thought content normal.  Vitals reviewed.   Diabetic Foot Exam - Simple   Simple Foot Form Diabetic Foot exam was performed with the following  findings:  Yes 01/22/2018  3:02 PM  Visual Inspection No deformities, no ulcerations, no other skin breakdown bilaterally:  Yes Sensation Testing Intact to touch and monofilament testing bilaterally:  Yes Pulse Check Posterior Tibialis and Dorsalis pulse intact bilaterally:  Yes Comments      BP (!) 168/82   Pulse 89   Temp 98.3 F (36.8 C) (Oral)   Ht _0  (1.626 m)   Wt 176 lb (79.8 kg)   LMP 01/22/1981 (LMP Unknown)   BMI 30.21 kg/m      Assessment & Plan:  NIZA SODERHOLM comes in today with chief complaint of Medical Management of Chronic Issues and Diabetes   Diagnosis and orders addressed:  1. Essential hypertension Will add Norvasc 5 mg today -Dash diet information given -Exercise encouraged - Stress Management  -Continue current meds - CMP14+EGFR - CBC with Differential/Platelet - amLODipine (NORVASC) 5 MG tablet; Take 1 tablet (5 mg total) by mouth daily.  Dispense: 90 tablet; Refill:  3  2. Chronic systolic heart failure (HCC) - CMP14+EGFR - CBC with Differential/Platelet  3. Gastroesophageal reflux disease, esophagitis presence not specified - CMP14+EGFR - CBC with Differential/Platelet  4. Uncontrolled type 2 diabetes mellitus with hyperglycemia (HCC) - Bayer DCA Hb A1c Waived - CMP14+EGFR - CBC with Differential/Platelet - Lipid panel - Microalbumin / creatinine urine ratio  5. Mild intermittent asthma, unspecified whether complicated - JDY51+GZFP - CBC with Differential/Platelet  6. Vitamin D deficiency - CMP14+EGFR - CBC with Differential/Platelet - VITAMIN D 25 Hydroxy (Vit-D Deficiency, Fractures)  7. Depression, major, single episode, moderate (HCC) Will increase Lexapro to 20 mg from 10 mg  Stress management  - escitalopram (LEXAPRO) 20 MG tablet; Take 1 tablet (20 mg total) by mouth daily.  Dispense: 90 tablet; Refill: 3 - CMP14+EGFR - CBC with Differential/Platelet  8. Colon cancer screening - Cologuard  9. Screening for  malignant neoplasm of the rectum - Cologuard  10. Need for hepatitis C screening test - Hepatitis C antibody  11. Osteopenia with high risk of fracture - DG [facility] DEXA   Labs pending Health Maintenance reviewed Diet and exercise encouraged  Follow up plan: 3 months    Evelina Dun, FNP

## 2018-01-22 NOTE — Patient Instructions (Signed)
Diabetes Mellitus and Nutrition When you have diabetes (diabetes mellitus), it is very important to have healthy eating habits because your blood sugar (glucose) levels are greatly affected by what you eat and drink. Eating healthy foods in the appropriate amounts, at about the same times every day, can help you:  Control your blood glucose.  Lower your risk of heart disease.  Improve your blood pressure.  Reach or maintain a healthy weight.  Every person with diabetes is different, and each person has different needs for a meal plan. Your health care provider may recommend that you work with a diet and nutrition specialist (dietitian) to make a meal plan that is best for you. Your meal plan may vary depending on factors such as:  The calories you need.  The medicines you take.  Your weight.  Your blood glucose, blood pressure, and cholesterol levels.  Your activity level.  Other health conditions you have, such as heart or kidney disease.  How do carbohydrates affect me? Carbohydrates affect your blood glucose level more than any other type of food. Eating carbohydrates naturally increases the amount of glucose in your blood. Carbohydrate counting is a method for keeping track of how many carbohydrates you eat. Counting carbohydrates is important to keep your blood glucose at a healthy level, especially if you use insulin or take certain oral diabetes medicines. It is important to know how many carbohydrates you can safely have in each meal. This is different for every person. Your dietitian can help you calculate how many carbohydrates you should have at each meal and for snack. Foods that contain carbohydrates include:  Bread, cereal, rice, pasta, and crackers.  Potatoes and corn.  Peas, beans, and lentils.  Milk and yogurt.  Fruit and juice.  Desserts, such as cakes, cookies, ice cream, and candy.  How does alcohol affect me? Alcohol can cause a sudden decrease in blood  glucose (hypoglycemia), especially if you use insulin or take certain oral diabetes medicines. Hypoglycemia can be a life-threatening condition. Symptoms of hypoglycemia (sleepiness, dizziness, and confusion) are similar to symptoms of having too much alcohol. If your health care provider says that alcohol is safe for you, follow these guidelines:  Limit alcohol intake to no more than 1 drink per day for nonpregnant women and 2 drinks per day for men. One drink equals 12 oz of beer, 5 oz of wine, or 1 oz of hard liquor.  Do not drink on an empty stomach.  Keep yourself hydrated with water, diet soda, or unsweetened iced tea.  Keep in mind that regular soda, juice, and other mixers may contain a lot of sugar and must be counted as carbohydrates.  What are tips for following this plan? Reading food labels  Start by checking the serving size on the label. The amount of calories, carbohydrates, fats, and other nutrients listed on the label are based on one serving of the food. Many foods contain more than one serving per package.  Check the total grams (g) of carbohydrates in one serving. You can calculate the number of servings of carbohydrates in one serving by dividing the total carbohydrates by 15. For example, if a food has 30 g of total carbohydrates, it would be equal to 2 servings of carbohydrates.  Check the number of grams (g) of saturated and trans fats in one serving. Choose foods that have low or no amount of these fats.  Check the number of milligrams (mg) of sodium in one serving. Most people   should limit total sodium intake to less than 2,300 mg per day.  Always check the nutrition information of foods labeled as "low-fat" or "nonfat". These foods may be higher in added sugar or refined carbohydrates and should be avoided.  Talk to your dietitian to identify your daily goals for nutrients listed on the label. Shopping  Avoid buying canned, premade, or processed foods. These  foods tend to be high in fat, sodium, and added sugar.  Shop around the outside edge of the grocery store. This includes fresh fruits and vegetables, bulk grains, fresh meats, and fresh dairy. Cooking  Use low-heat cooking methods, such as baking, instead of high-heat cooking methods like deep frying.  Cook using healthy oils, such as olive, canola, or sunflower oil.  Avoid cooking with butter, cream, or high-fat meats. Meal planning  Eat meals and snacks regularly, preferably at the same times every day. Avoid going long periods of time without eating.  Eat foods high in fiber, such as fresh fruits, vegetables, beans, and whole grains. Talk to your dietitian about how many servings of carbohydrates you can eat at each meal.  Eat 4-6 ounces of lean protein each day, such as lean meat, chicken, fish, eggs, or tofu. 1 ounce is equal to 1 ounce of meat, chicken, or fish, 1 egg, or 1/4 cup of tofu.  Eat some foods each day that contain healthy fats, such as avocado, nuts, seeds, and fish. Lifestyle   Check your blood glucose regularly.  Exercise at least 30 minutes 5 or more days each week, or as told by your health care provider.  Take medicines as told by your health care provider.  Do not use any products that contain nicotine or tobacco, such as cigarettes and e-cigarettes. If you need help quitting, ask your health care provider.  Work with a counselor or diabetes educator to identify strategies to manage stress and any emotional and social challenges. What are some questions to ask my health care provider?  Do I need to meet with a diabetes educator?  Do I need to meet with a dietitian?  What number can I call if I have questions?  When are the best times to check my blood glucose? Where to find more information:  American Diabetes Association: diabetes.org/food-and-fitness/food  Academy of Nutrition and Dietetics:  www.eatright.org/resources/health/diseases-and-conditions/diabetes  National Institute of Diabetes and Digestive and Kidney Diseases (NIH): www.niddk.nih.gov/health-information/diabetes/overview/diet-eating-physical-activity Summary  A healthy meal plan will help you control your blood glucose and maintain a healthy lifestyle.  Working with a diet and nutrition specialist (dietitian) can help you make a meal plan that is best for you.  Keep in mind that carbohydrates and alcohol have immediate effects on your blood glucose levels. It is important to count carbohydrates and to use alcohol carefully. This information is not intended to replace advice given to you by your health care provider. Make sure you discuss any questions you have with your health care provider. Document Released: 12/22/2004 Document Revised: 05/01/2016 Document Reviewed: 05/01/2016 Elsevier Interactive Patient Education  2018 Elsevier Inc.  

## 2018-01-23 ENCOUNTER — Telehealth: Payer: Self-pay | Admitting: Family

## 2018-01-23 LAB — CBC WITH DIFFERENTIAL/PLATELET
Basophils Absolute: 0.1 10*3/uL (ref 0.0–0.2)
Basos: 1 %
EOS (ABSOLUTE): 0.2 10*3/uL (ref 0.0–0.4)
Eos: 3 %
Hematocrit: 39.6 % (ref 34.0–46.6)
Hemoglobin: 13.4 g/dL (ref 11.1–15.9)
Immature Grans (Abs): 0 10*3/uL (ref 0.0–0.1)
Immature Granulocytes: 0 %
Lymphocytes Absolute: 2.2 10*3/uL (ref 0.7–3.1)
Lymphs: 31 %
MCH: 29.3 pg (ref 26.6–33.0)
MCHC: 33.8 g/dL (ref 31.5–35.7)
MCV: 87 fL (ref 79–97)
Monocytes Absolute: 0.5 10*3/uL (ref 0.1–0.9)
Monocytes: 8 %
Neutrophils Absolute: 4.1 10*3/uL (ref 1.4–7.0)
Neutrophils: 57 %
Platelets: 281 10*3/uL (ref 150–450)
RBC: 4.58 x10E6/uL (ref 3.77–5.28)
RDW: 13.3 % (ref 12.3–15.4)
WBC: 7.2 10*3/uL (ref 3.4–10.8)

## 2018-01-23 LAB — CMP14+EGFR
ALT: 12 IU/L (ref 0–32)
AST: 13 IU/L (ref 0–40)
Albumin/Globulin Ratio: 2.3 — ABNORMAL HIGH (ref 1.2–2.2)
Albumin: 4.1 g/dL (ref 3.5–4.8)
Alkaline Phosphatase: 55 IU/L (ref 39–117)
BUN/Creatinine Ratio: 22 (ref 12–28)
BUN: 19 mg/dL (ref 8–27)
Bilirubin Total: 0.3 mg/dL (ref 0.0–1.2)
CO2: 22 mmol/L (ref 20–29)
Calcium: 9.6 mg/dL (ref 8.7–10.3)
Chloride: 106 mmol/L (ref 96–106)
Creatinine, Ser: 0.86 mg/dL (ref 0.57–1.00)
GFR calc Af Amer: 78 mL/min/{1.73_m2} (ref >59)
GFR calc non Af Amer: 67 mL/min/{1.73_m2} (ref >59)
Globulin, Total: 1.8 g/dL (ref 1.5–4.5)
Glucose: 202 mg/dL — ABNORMAL HIGH (ref 65–99)
Potassium: 3.7 mmol/L (ref 3.5–5.2)
Sodium: 144 mmol/L (ref 134–144)
Total Protein: 5.9 g/dL — ABNORMAL LOW (ref 6.0–8.5)

## 2018-01-23 LAB — HEPATITIS C ANTIBODY: Hep C Virus Ab: 0.1 s/co ratio (ref 0.0–0.9)

## 2018-01-23 LAB — VITAMIN D 25 HYDROXY (VIT D DEFICIENCY, FRACTURES): Vit D, 25-Hydroxy: 13.9 ng/mL — ABNORMAL LOW (ref 30.0–100.0)

## 2018-01-23 LAB — MICROALBUMIN / CREATININE URINE RATIO
Creatinine, Urine: 194.9 mg/dL
Microalb/Creat Ratio: 221.8 mg/g creat — ABNORMAL HIGH (ref 0.0–30.0)
Microalbumin, Urine: 432.3 ug/mL

## 2018-01-23 LAB — LIPID PANEL
Chol/HDL Ratio: 5.6 ratio — ABNORMAL HIGH (ref 0.0–4.4)
Cholesterol, Total: 179 mg/dL (ref 100–199)
HDL: 32 mg/dL — ABNORMAL LOW (ref >39)
LDL Calculated: 77 mg/dL (ref 0–99)
Triglycerides: 349 mg/dL — ABNORMAL HIGH (ref 0–149)
VLDL Cholesterol Cal: 70 mg/dL — ABNORMAL HIGH (ref 5–40)

## 2018-01-24 ENCOUNTER — Other Ambulatory Visit: Payer: Self-pay | Admitting: Family

## 2018-01-24 DIAGNOSIS — M85852 Other specified disorders of bone density and structure, left thigh: Secondary | ICD-10-CM | POA: Diagnosis not present

## 2018-01-24 MED ORDER — METFORMIN HCL ER 500 MG PO TB24: 500.0000 mg | ORAL_TABLET | Freq: Every day | ORAL | 11 refills | Status: DC

## 2018-01-31 ENCOUNTER — Telehealth: Payer: Self-pay | Admitting: Family

## 2018-01-31 ENCOUNTER — Encounter: Payer: Self-pay | Admitting: Family

## 2018-01-31 ENCOUNTER — Ambulatory Visit (INDEPENDENT_AMBULATORY_CARE_PROVIDER_SITE_OTHER): Payer: Medicare Other | Admitting: Family

## 2018-01-31 ENCOUNTER — Other Ambulatory Visit: Payer: Self-pay | Admitting: *Deleted

## 2018-01-31 VITALS — BP 138/64 | HR 52 | Temp 97.5°F | Ht 64.0 in | Wt 175.2 lb

## 2018-01-31 DIAGNOSIS — H6123 Impacted cerumen, bilateral: Secondary | ICD-10-CM

## 2018-01-31 MED ORDER — VITAMIN D (ERGOCALCIFEROL) 1.25 MG (50000 UNIT) PO CAPS: 50000.0000 [IU] | ORAL_CAPSULE | ORAL | 4 refills | Status: DC

## 2018-01-31 NOTE — Progress Notes (Signed)
   Subjective:    Patient ID: [name], female    DOB: [birth year], 72 y.o.   MRN: [id number]   Chief Complaint  Patient presents with  . ears plugged    Ear Fullness   There is pain in both ears. This is a recurrent problem. The current episode started 1 to 4 weeks ago. The problem occurs constantly. The problem has been unchanged. There has been no fever. The pain is mild. Associated symptoms include hearing loss. Pertinent negatives include no coughing, diarrhea, ear discharge, headaches, rhinorrhea or sore throat. She has tried ear drops for the symptoms. The treatment provided mild relief.      Review of Systems  HENT: Positive for hearing loss. Negative for ear discharge, rhinorrhea and sore throat.   Respiratory: Negative for cough.   Gastrointestinal: Negative for diarrhea.  Neurological: Negative for headaches.  All other systems reviewed and are negative.      Objective:   Physical Exam  Constitutional: She appears well-developed and well-nourished. No distress.  HENT:  Head: Normocephalic and atraumatic.  Mouth/Throat: Oropharynx is clear and moist.  Bilateral ear cerumen impaction   Eyes: Pupils are equal, round, and reactive to light.  Neck: Normal range of motion. Neck supple. No thyromegaly present.  Cardiovascular: Normal rate, regular rhythm, normal heart sounds and intact distal pulses.  No murmur heard. Pulmonary/Chest: Effort normal and breath sounds normal. No respiratory distress. She has no wheezes.  Musculoskeletal: Normal range of motion. She exhibits no edema or tenderness.  Neurological: She is alert. She has normal reflexes.  Skin: Skin is warm and dry.  Psychiatric: She has a normal mood and affect. Her behavior is normal. Judgment and thought content normal.  Vitals reviewed.  Bilateral ears cleaned with warm water and peroxide. Right ear cleaned, TM WNL. Left ear cleaned, but still a small amount of cerumen we were unable to remove.     BP 138/64   Pulse (!) 52   Temp (!) 97.5 F (36.4 C) (Oral)   Ht 5\' 4"  (1.626 m)   Wt 175 lb 3.2 oz (79.5 kg)   LMP 01/22/1981 (LMP Unknown)   BMI 30.07 kg/m      Assessment & Plan:  [name] comes in today with chief complaint of ears plugged   Diagnosis and orders addressed:  1. Bilateral impacted cerumen Continue to use OTC drops  Do not stick anything into ear RTO if symptoms worsen or do not improve    Evelina Dun, FNP

## 2018-01-31 NOTE — Telephone Encounter (Signed)
Error scheduled pt for an apt

## 2018-01-31 NOTE — Patient Instructions (Signed)
Earwax Buildup, Adult The ears produce a substance called earwax that helps keep bacteria out of the ear and protects the skin in the ear canal. Occasionally, earwax can build up in the ear and cause discomfort or hearing loss. What increases the risk? This condition is more likely to develop in people who:  Are female.  Are elderly.  Naturally produce more earwax.  Clean their ears often with cotton swabs.  Use earplugs often.  Use in-ear headphones often.  Wear hearing aids.  Have narrow ear canals.  Have earwax that is overly thick or sticky.  Have eczema.  Are dehydrated.  Have excess hair in the ear canal.  What are the signs or symptoms? Symptoms of this condition include:  Reduced or muffled hearing.  A feeling of fullness in the ear or feeling that the ear is plugged.  Fluid coming from the ear.  Ear pain.  Ear itch.  Ringing in the ear.  Coughing.  An obvious piece of earwax that can be seen inside the ear canal.  How is this diagnosed? This condition may be diagnosed based on:  Your symptoms.  Your medical history.  An ear exam. During the exam, your health care provider will look into your ear with an instrument called an otoscope.  You may have tests, including a hearing test. How is this treated? This condition may be treated by:  Using ear drops to soften the earwax.  Having the earwax removed by a health care provider. The health care provider may: ? Flush the ear with water. ? Use an instrument that has a loop on the end (curette). ? Use a suction device.  Surgery to remove the wax buildup. This may be done in severe cases.  Follow these instructions at home:  Take over-the-counter and prescription medicines only as told by your health care provider.  Do not put any objects, including cotton swabs, into your ear. You can clean the opening of your ear canal with a washcloth or facial tissue.  Follow instructions from your health  care provider about cleaning your ears. Do not over-clean your ears.  Drink enough fluid to keep your urine clear or pale yellow. This will help to thin the earwax.  Keep all follow-up visits as told by your health care provider. If earwax builds up in your ears often or if you use hearing aids, consider seeing your health care provider for routine, preventive ear cleanings. Ask your health care provider how often you should schedule your cleanings.  If you have hearing aids, clean them according to instructions from the manufacturer and your health care provider. Contact a health care provider if:  You have ear pain.  You develop a fever.  You have blood, pus, or other fluid coming from your ear.  You have hearing loss.  You have ringing in your ears that does not go away.  Your symptoms do not improve with treatment.  You feel like the room is spinning (vertigo). Summary  Earwax can build up in the ear and cause discomfort or hearing loss.  The most common symptoms of this condition include reduced or muffled hearing and a feeling of fullness in the ear or feeling that the ear is plugged.  This condition may be diagnosed based on your symptoms, your medical history, and an ear exam.  This condition may be treated by using ear drops to soften the earwax or by having the earwax removed by a health care provider.  Do   not put any objects, including cotton swabs, into your ear. You can clean the opening of your ear canal with a washcloth or facial tissue. This information is not intended to replace advice given to you by your health care provider. Make sure you discuss any questions you have with your health care provider. Document Released: 05/04/2004 Document Revised: 06/07/2016 Document Reviewed: 06/07/2016 Elsevier Interactive Patient Education  2018 Elsevier Inc.  

## 2018-02-05 ENCOUNTER — Telehealth: Payer: Self-pay | Admitting: Family

## 2018-02-05 MED ORDER — DOXYCYCLINE HYCLATE 100 MG PO TABS: 100.0000 mg | ORAL_TABLET | Freq: Two times a day (BID) | ORAL | 0 refills | Status: DC

## 2018-02-05 NOTE — Telephone Encounter (Signed)
Patient was seen 10/24 and was told to call back if not better- patient states she is no better. Please advise

## 2018-02-05 NOTE — Telephone Encounter (Signed)
What symptoms do you have? Pt is dizzy, bridge of her nose and corner of eye hurt really bad  How long have you been sick? About a week  Have you been seen for this problem? Yes for her ears was told to call back if not better  If your provider decides to give you a prescription, which pharmacy would you like for it to be sent to? [facility] [place]   Patient informed that this information will be sent to the clinical staff for review and that they should receive a follow up call.

## 2018-02-05 NOTE — Telephone Encounter (Signed)
Patient aware.

## 2018-02-05 NOTE — Progress Notes (Signed)
HPI  The patient presents for follow up of dyspnea.  Was previously found to have an EF of 25%.   Her meds were titrated and her EF in Dec of 2017 demonstrated a normal EF. Since I last saw her she has done relatively well.  She fatigues.  She walks for exercise occasionally.  She denies any chest pressure, neck or arm discomfort.  She does occasionally have some palpitations.  She has some dizzy spells but she thought this was related to an ear infection and some cerumen that she is battling.  She has not had any new shortness of breath, PND or orthopnea.  She is had no weight gain or edema.  Of note her blood pressure has not been controlled and she was started on Norvasc a few days ago.  She forgot to take her dose today.   Allergies  Allergen Reactions  . Levaquin [Levofloxacin In D5w] Diarrhea  . Penicillins Rash    Current Outpatient Medications  Medication Sig Dispense Refill  . albuterol (PROVENTIL HFA;VENTOLIN HFA) 108 (90 Base) MCG/ACT inhaler Inhale 2 puffs into the lungs every 6 (six) hours as needed. 1 Inhaler 6  . amLODipine (NORVASC) 5 MG tablet Take 1 tablet (5 mg total) by mouth daily. 90 tablet 3  . aspirin 81 MG chewable tablet Chew by mouth daily.    . bisoprolol (ZEBETA) 10 MG tablet TAKE ONE TABLET BY MOUTH ONCE DAILY 90 tablet 1  . doxycycline (VIBRA-TABS) 100 MG tablet Take 1 tablet (100 mg total) by mouth 2 (two) times daily. 20 tablet 0  . ENTRESTO 97-103 MG TAKE 1 TABLET BY MOUTH TWICE DAILY 60 tablet 6  . escitalopram (LEXAPRO) 20 MG tablet Take 1 tablet (20 mg total) by mouth daily. 90 tablet 3  . metFORMIN (GLUCOPHAGE XR) 500 MG 24 hr tablet Take 1 tablet (500 mg total) by mouth daily with breakfast. 30 tablet 11  . glucose blood (ONETOUCH VERIO) test strip Use as instructed 100 each 12  . ONETOUCH DELICA LANCETS FINE MISC Use to check BG once daily 100 each 2  . Vitamin D, Ergocalciferol, (DRISDOL) 50000 units CAPS capsule Take 1 capsule (50,000 Units  total) by mouth every 7 (seven) days. (Patient not taking: Reported on 02/06/2018) 12 capsule 4   No current facility-administered medications for this visit.     Past Medical History:  Diagnosis Date  . Asthma   . Chronic systolic heart failure (Echo)    Echo (08/26/13):  Mild LVH. EF 25% to 30%. Diffuse HK. Aortic valve: There was trivial regurgitation. Mitral valve: There was mild regurgitation. Left atrium: The atrium was mildly dilated.  Marland Kitchen GERD (gastroesophageal reflux disease)   . Headache(784.0)   . History of nephrolithiasis   . Hypertension   . NICM (nonischemic cardiomyopathy) ([place])    a. LHC (08/2013):  no CAD  . Stroke ([place])   . Type II or unspecified type diabetes mellitus without mention of complication, uncontrolled    borderline    Past Surgical History:  Procedure Laterality Date  . ABDOMINAL HYSTERECTOMY    . LEFT AND RIGHT HEART CATHETERIZATION WITH CORONARY ANGIOGRAM N/A 09/03/2013   Procedure: LEFT AND RIGHT HEART CATHETERIZATION WITH CORONARY ANGIOGRAM;  Surgeon: [name], MD;  Location: [facility] CATH LAB;  Service: Cardiovascular;  Laterality: N/A;  . OOPHORECTOMY     removed 1 ovary and 1 ovary remains.  . ureteral extraction of kidney stone      ROS:  As stated in the HPI and negative for all other systems.  PHYSICAL EXAM BP (!) 160/98   Pulse 91   Ht 5\' 4"  (1.626 m)   Wt 176 lb (79.8 kg)   LMP 01/22/1981 (LMP Unknown)   BMI 30.21 kg/m   GENERAL:  Well appearing NECK:  No jugular venous distention, waveform within normal limits, carotid upstroke brisk and symmetric, no bruits, no thyromegaly LUNGS:  Clear to auscultation bilaterally CHEST:  Unremarkable HEART:  PMI not displaced or sustained,S1 and S2 within normal limits, no S3, no S4, no clicks, no rubs, 2 out of 6 apical systolic murmur radiating slightly at the aortic outflow tract, no diastolic murmurs ABD:  Flat, positive bowel sounds normal in frequency in pitch, no bruits, no  rebound, no guarding, no midline pulsatile mass, no hepatomegaly, no splenomegaly EXT:  2 plus pulses throughout, no edema, no cyanosis no clubbing    EKG:  Sinus rhythm, rate 91, left axis deviation, left anterior fascicular block, premature ventricular contraction, anterolateral T wave inversions unchanged from previous, poor anterior R wave progression.  Prolonged QT  02/06/2018  ASSESSMENT AND PLAN  CARDIOMYOPATHY:    EF was normal on echo last year.  She will continue the meds as listed.   HTN:  The blood pressure is not at target but she was just started on Norvasc and she actually forgot to take the medicine today.   SLEEP APNEA: She has a history of this but was not treated in the past.  She describes fatigue.  She has difficult to control hypertension.  She needs a sleep study.    QT PROLONGED: She has no symptoms related to this.  I will follow-up with repeat EKGs in the future.

## 2018-02-05 NOTE — Telephone Encounter (Signed)
Doxycycline Prescription sent to pharmacy   

## 2018-02-06 ENCOUNTER — Ambulatory Visit (INDEPENDENT_AMBULATORY_CARE_PROVIDER_SITE_OTHER): Payer: Medicare Other | Admitting: Cardiology

## 2018-02-06 ENCOUNTER — Encounter: Payer: Self-pay | Admitting: Cardiology

## 2018-02-06 VITALS — BP 160/98 | HR 91 | Ht 64.0 in | Wt 176.0 lb

## 2018-02-06 DIAGNOSIS — I5022 Chronic systolic (congestive) heart failure: Secondary | ICD-10-CM

## 2018-02-06 DIAGNOSIS — I10 Essential (primary) hypertension: Secondary | ICD-10-CM | POA: Diagnosis not present

## 2018-02-06 DIAGNOSIS — I429 Cardiomyopathy, unspecified: Secondary | ICD-10-CM | POA: Diagnosis not present

## 2018-02-06 DIAGNOSIS — R0683 Snoring: Secondary | ICD-10-CM

## 2018-02-06 NOTE — Patient Instructions (Addendum)
Medication Instructions:  The current medical regimen is effective;  continue present plan and medications.  If you need a refill on your cardiac medications before your next appointment, please call your pharmacy.   Testing/Procedures: Your physician has recommended that you have a sleep study. This test records several body functions during sleep, including: brain activity, eye movement, oxygen and carbon dioxide blood levels, heart rate and rhythm, breathing rate and rhythm, the flow of air through your mouth and nose, snoring, body muscle movements, and chest and belly movement.  You will be contacted to be scheduled for this testing.  Follow-Up: Follow up in 6 months with Dr. Percival Spanish.  You will receive a letter in the mail 2 months before you are due.  Please call us when you receive this letter to schedule your follow up appointment.  Thank you for choosing [facility]!!

## 2018-02-11 DIAGNOSIS — H6122 Impacted cerumen, left ear: Secondary | ICD-10-CM | POA: Diagnosis not present

## 2018-02-11 DIAGNOSIS — H60312 Diffuse otitis externa, left ear: Secondary | ICD-10-CM | POA: Diagnosis not present

## 2018-02-20 ENCOUNTER — Encounter: Payer: Self-pay | Admitting: Pharmacist Clinician (PhC)/ Clinical Pharmacy Specialist

## 2018-02-20 ENCOUNTER — Ambulatory Visit (INDEPENDENT_AMBULATORY_CARE_PROVIDER_SITE_OTHER): Payer: Medicare Other | Admitting: Pharmacist Clinician (PhC)/ Clinical Pharmacy Specialist

## 2018-02-20 DIAGNOSIS — M810 Age-related osteoporosis without current pathological fracture: Secondary | ICD-10-CM | POA: Diagnosis not present

## 2018-02-20 DIAGNOSIS — Z780 Asymptomatic menopausal state: Secondary | ICD-10-CM

## 2018-02-20 DIAGNOSIS — M8580 Other specified disorders of bone density and structure, unspecified site: Secondary | ICD-10-CM

## 2018-02-20 MED ORDER — RISEDRONATE SODIUM 150 MG PO TABS: 150.0000 mg | ORAL_TABLET | ORAL | 4 refills | Status: DC

## 2018-02-20 NOTE — Progress Notes (Signed)
[name] is a 72 year old female who is referred by Evelina Dun for a visit for osteopenia with a high FRAX % fracture risk.  Patient has a family history of osteoporosis in her mother, who broke a hip and is now deceased.  Patient does not do any weight baring exercises and has no other risk factors for osteoporosis other than family history.  A/P:  1.  Osteopenia:  Based on T-score of -2.4/femur neck left  Patient is only 1/10 off being osteoporotic and has an elevated FRAX score for both a hip fracture and any major osteoporotic fracture, placing her in the recommended treatment range for a bisphosphonate.  Her Vitamin D level is extremely low at 13.9.  2.  Patient has a history of mild GERD,  She has normal renal function.  Patient has been to the dentist in the past 5 months and had x-rays, there is no history of osteonecrosis of the jaw bone or any bone loss on dental visits.  3.  Start actonel 150mg  monthly.  Counseled patient on how to take actonel and gave her printed hand out with instructions as well.  She will call if she exerpiences worsening GERD and will inform her dentist about starting actonel.  4.  Patient just started on vitamin D 50,000IU weekly.  I added calcium citrate 1000mg  daily at our visit today.  I also gave her a hand out with high calcium foods listed.  Repeat dexascan in 2 years  Total time with patient 30 minutes  [employer], pharmD. CPP, CLS

## 2018-07-05 ENCOUNTER — Ambulatory Visit (INDEPENDENT_AMBULATORY_CARE_PROVIDER_SITE_OTHER): Payer: Medicare Other | Admitting: Family

## 2018-07-05 ENCOUNTER — Other Ambulatory Visit: Payer: Self-pay

## 2018-07-05 ENCOUNTER — Encounter: Payer: Self-pay | Admitting: Family

## 2018-07-05 DIAGNOSIS — Z7189 Other specified counseling: Secondary | ICD-10-CM | POA: Diagnosis not present

## 2018-07-05 DIAGNOSIS — R6889 Other general symptoms and signs: Secondary | ICD-10-CM | POA: Diagnosis not present

## 2018-07-05 MED ORDER — BENZONATATE 200 MG PO CAPS: 200.0000 mg | ORAL_CAPSULE | Freq: Three times a day (TID) | ORAL | 1 refills | Status: DC | PRN

## 2018-07-05 MED ORDER — ALBUTEROL SULFATE HFA 108 (90 BASE) MCG/ACT IN AERS: 2.0000 | INHALATION_SPRAY | Freq: Four times a day (QID) | RESPIRATORY_TRACT | 6 refills | Status: DC | PRN

## 2018-07-05 MED ORDER — PREDNISONE 10 MG (21) PO TBPK: ORAL_TABLET | ORAL | 0 refills | Status: DC

## 2018-07-05 NOTE — Progress Notes (Signed)
Virtual Visit via telephone Note  I connected with ANAHI BELMAR on 07/05/18 at 3:22pm by telephone and verified that I am speaking with the correct person using two identifiers. LENETTE RAU is currently located at home and no one is currently with her during visit. The provider, Evelina Dun, FNP is located in their office at time of visit.  I discussed the limitations, risks, security and privacy concerns of performing an evaluation and management service by telephone and the availability of in person appointments. I also discussed with the patient that there may be a patient responsible charge related to this service. The patient expressed understanding and agreed to proceed.   History and Present Illness:  Cough  This is a new problem. The current episode started in the past 7 days. The problem has been waxing and waning. The problem occurs every few minutes. The cough is non-productive. Associated symptoms include headaches, myalgias, rhinorrhea, a sore throat, shortness of breath and wheezing. Pertinent negatives include no chills, ear congestion, ear pain, fever, nasal congestion or postnasal drip. The symptoms are aggravated by lying down. She has tried rest and OTC cough suppressant for the symptoms. The treatment provided mild relief. Her past medical history is significant for asthma.      Review of Systems  Constitutional: Negative for chills and fever.  HENT: Positive for rhinorrhea and sore throat. Negative for ear pain and postnasal drip.   Respiratory: Positive for cough, shortness of breath and wheezing.   Musculoskeletal: Positive for myalgias.  Neurological: Positive for headaches.  All other systems reviewed and are negative.      Observations/Objective: No SOB or distress noted  Assessment and Plan: 1. Flu-like symptoms - albuterol (PROVENTIL HFA;VENTOLIN HFA) 108 (90 Base) MCG/ACT inhaler; Inhale 2 puffs into the lungs every 6 (six) hours as needed.   Dispense: 1 Inhaler; Refill: 6 - predniSONE (STERAPRED UNI-PAK 21 TAB) 10 MG (21) TBPK tablet; Use as directed  Dispense: 21 tablet; Refill: 0 - benzonatate (TESSALON) 200 MG capsule; Take 1 capsule (200 mg total) by mouth 3 (three) times daily as needed.  Dispense: 30 capsule; Refill: 1  2. Educated About Covid-19 Virus Infection - predniSONE (STERAPRED UNI-PAK 21 TAB) 10 MG (21) TBPK tablet; Use as directed  Dispense: 21 tablet; Refill: 0 - benzonatate (TESSALON) 200 MG capsule; Take 1 capsule (200 mg total) by mouth 3 (three) times daily as needed.  Dispense: 30 capsule; Refill: 1  Rest Force fluids Tylenol or motrin as needed Droplet precautions discussed Avoid contact with anyone over the age of 77 years or older RTO if symptoms worsen or do not improve or worsens, red flags discussed that she agrees she will go to ED       I discussed the assessment and treatment plan with the patient. The patient was provided an opportunity to ask questions and all were answered. The patient agreed with the plan and demonstrated an understanding of the instructions.   The patient was advised to call back or seek an in-person evaluation if the symptoms worsen or if the condition fails to improve as anticipated.  The above assessment and management plan was discussed with the patient. The patient verbalized understanding of and has agreed to the management plan. Patient is aware to call the clinic if symptoms persist or worsen. Patient is aware when to return to the clinic for a follow-up visit. Patient educated on when it is appropriate to go to the emergency department.  Call ended 3:36pm, I provided 14 minutes of non-face-to-face time during this encounter.    Evelina Dun, FNP

## 2018-07-10 ENCOUNTER — Other Ambulatory Visit: Payer: Self-pay | Admitting: Physician Assistant

## 2018-07-10 ENCOUNTER — Telehealth: Payer: Self-pay | Admitting: Family

## 2018-07-10 MED ORDER — DOXYCYCLINE HYCLATE 100 MG PO TABS: 100.0000 mg | ORAL_TABLET | Freq: Two times a day (BID) | ORAL | 0 refills | Status: DC

## 2018-07-10 NOTE — Telephone Encounter (Signed)
Please use the albuterol inhaler 4 times a day regularly.  It can help keep your airway open.  I have sent doxycycline 100 mg 1 tablet twice daily for 10 days to the pharmacy

## 2018-07-10 NOTE — Telephone Encounter (Signed)
Pt called and aware

## 2018-07-17 ENCOUNTER — Encounter: Payer: Self-pay | Admitting: Family

## 2018-07-17 ENCOUNTER — Ambulatory Visit (INDEPENDENT_AMBULATORY_CARE_PROVIDER_SITE_OTHER): Payer: Medicare Other | Admitting: Family

## 2018-07-17 ENCOUNTER — Other Ambulatory Visit: Payer: Self-pay

## 2018-07-17 DIAGNOSIS — Z7189 Other specified counseling: Secondary | ICD-10-CM

## 2018-07-17 DIAGNOSIS — J189 Pneumonia, unspecified organism: Secondary | ICD-10-CM | POA: Diagnosis not present

## 2018-07-17 DIAGNOSIS — R6889 Other general symptoms and signs: Secondary | ICD-10-CM | POA: Diagnosis not present

## 2018-07-17 DIAGNOSIS — Z20822 Contact with and (suspected) exposure to covid-19: Secondary | ICD-10-CM

## 2018-07-17 NOTE — Progress Notes (Signed)
   Virtual Visit via telephone Note  I connected with [name] on 07/17/18 at 3:00 pm by telephone and verified that I am speaking with the correct person using two identifiers. [name] is currently located at home and no one is currently with her during visit. The provider, Evelina Dun, FNP is located in their office at time of visit.  I discussed the limitations, risks, security and privacy concerns of performing an evaluation and management service by telephone and the availability of in person appointments. I also discussed with the patient that there may be a patient responsible charge related to this service. The patient expressed understanding and agreed to proceed.   History and Present Illness:  PT calls today with neck pain, dry cough, and weakness. She was seen on 07/05/18 and thought to have COVID-19 and possible pneumonia.  She states her has fever  And cough has improved.   She completed prednisone.  She is continuing the doxycycline.  Sore Throat   The current episode started 1 to 4 weeks ago. The problem has been gradually improving. There has been no fever. The pain is at a severity of 6/10. The pain is mild. Associated symptoms include coughing (dry), headaches, neck pain, shortness of breath (improved, but still at times) and swollen glands. Pertinent negatives include no congestion, ear discharge, ear pain or trouble swallowing. She has tried acetaminophen and NSAIDs for the symptoms. The treatment provided mild relief.      Review of Systems  HENT: Negative for congestion, ear discharge, ear pain and trouble swallowing.   Respiratory: Positive for cough (dry) and shortness of breath (improved, but still at times).   Musculoskeletal: Positive for neck pain.  Neurological: Positive for headaches.  All other systems reviewed and are negative.   Observations/Objective: No SOB or distress noted  Assessment and Plan: 1. Suspected Covid-19 Virus  Infection  2. Educated About Covid-19 Virus Infection  3. Pneumonia due to infectious organism, unspecified laterality, unspecified part of lung  Given symptoms I feel like her symptoms are improving Continue doxycycline If SOB worsens need to follow up [facility] at this time, continue to monitor this Continue to avoid work Red flags discussed to go to ED Continue tylenol of back and neck pain       I discussed the assessment and treatment plan with the patient. The patient was provided an opportunity to ask questions and all were answered. The patient agreed with the plan and demonstrated an understanding of the instructions.   The patient was advised to call back or seek an in-person evaluation if the symptoms worsen or if the condition fails to improve as anticipated.  The above assessment and management plan was discussed with the patient. The patient verbalized understanding of and has agreed to the management plan. Patient is aware to call the clinic if symptoms persist or worsen. Patient is aware when to return to the clinic for a follow-up visit. Patient educated on when it is appropriate to go to the emergency department.    Call ended 3:18 pm, I provided 18 minutes of non-face-to-face time during this encounter.    Evelina Dun, FNP

## 2018-09-04 ENCOUNTER — Telehealth: Payer: Self-pay | Admitting: Family

## 2018-09-06 ENCOUNTER — Telehealth: Payer: Self-pay | Admitting: Family

## 2018-09-24 ENCOUNTER — Other Ambulatory Visit: Payer: Self-pay | Admitting: Cardiology

## 2018-09-25 ENCOUNTER — Other Ambulatory Visit: Payer: Self-pay

## 2018-09-25 MED ORDER — ENTRESTO 97-103 MG PO TABS: 1.0000 | ORAL_TABLET | Freq: Two times a day (BID) | ORAL | 5 refills | Status: DC

## 2018-12-15 ENCOUNTER — Other Ambulatory Visit: Payer: Self-pay | Admitting: Cardiology

## 2019-01-16 IMAGING — DX DG KNEE 1-2V*L*
2 series · 2 of 2 positions shown · non-contrast
Comparison: None.

CLINICAL DATA: Left knee pain, no known injury.

EXAM:
LEFT KNEE - 1-2 VIEW

[knee ap]
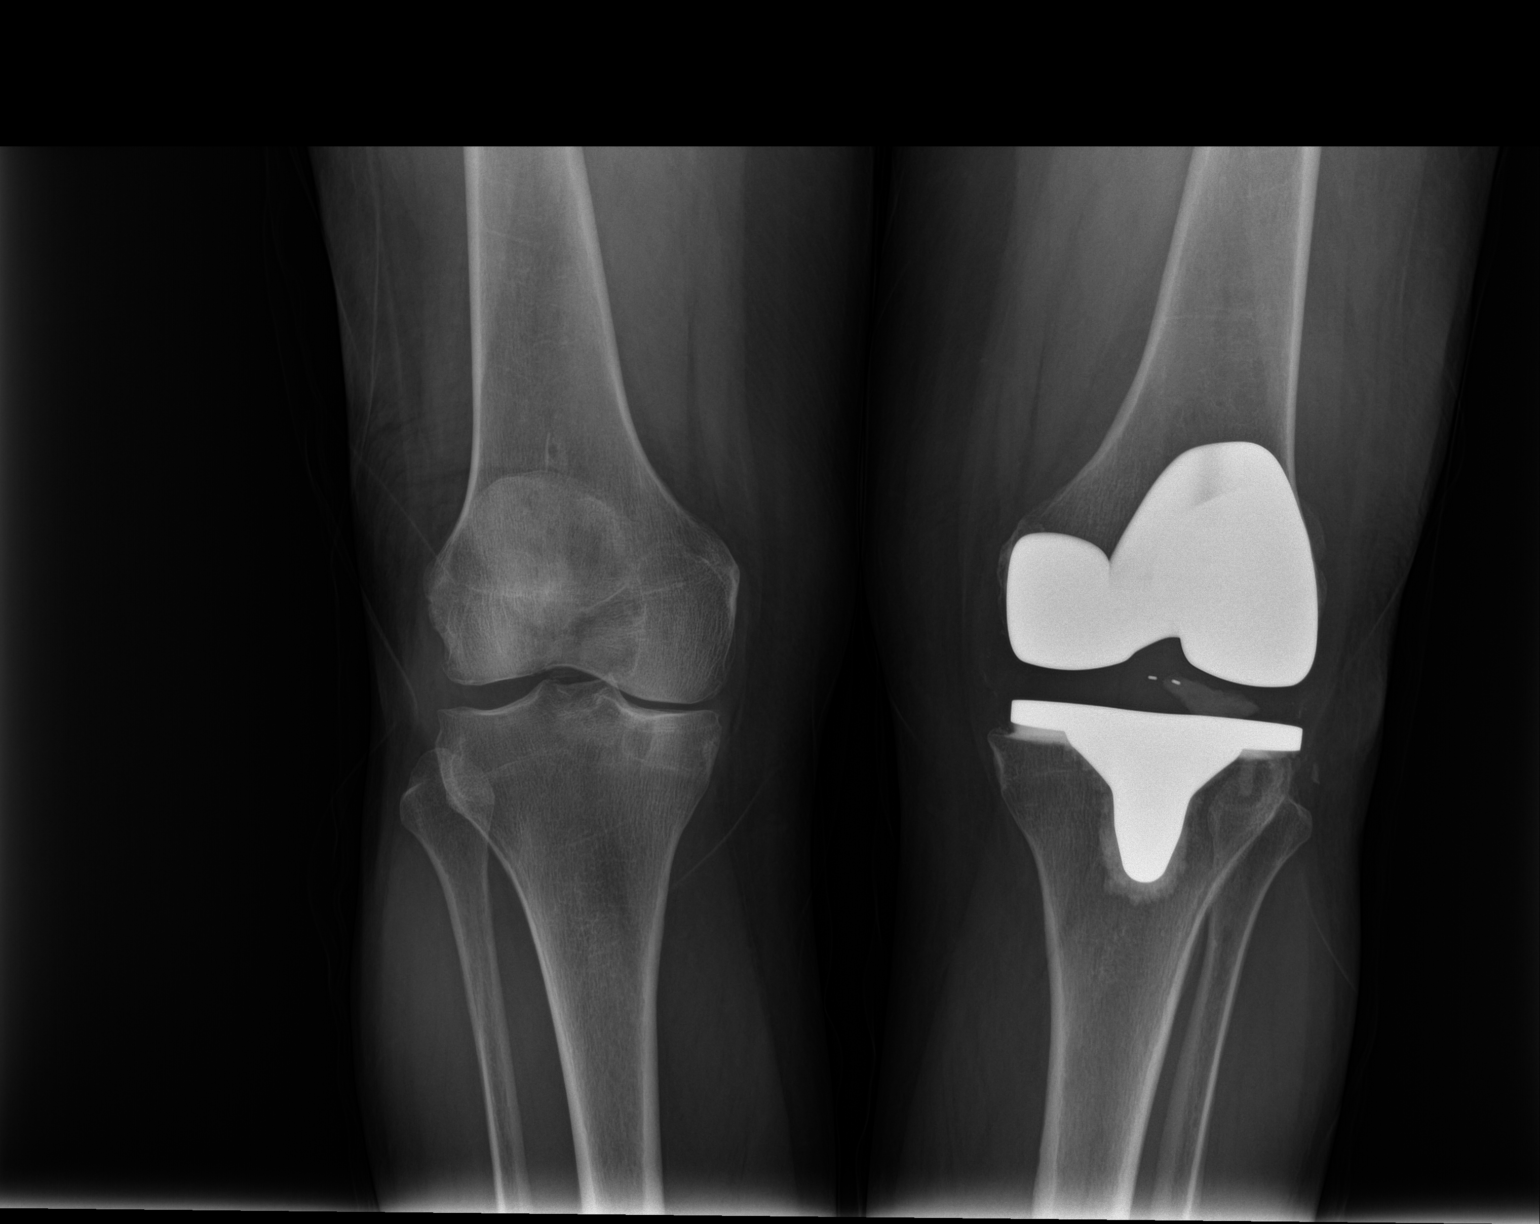

[knee lat]
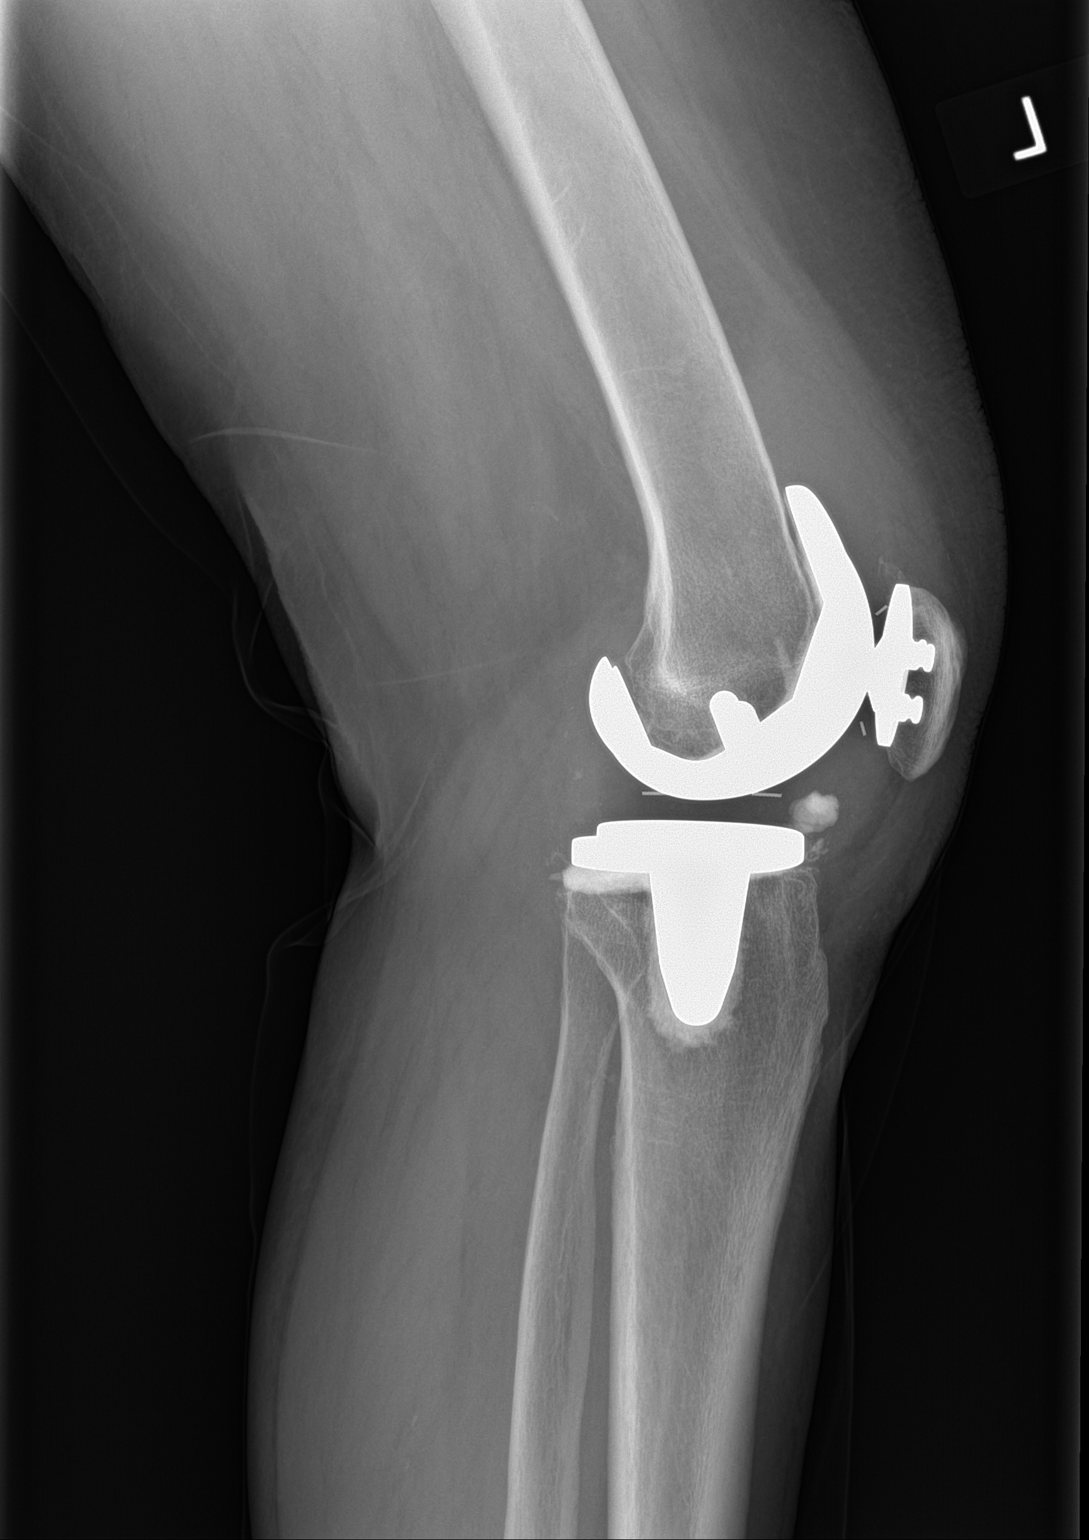

[2 of 2 positions shown; findings below may reference images not displayed]

FINDINGS: Left knee arthroplasty hardware appears intact and appropriately
positioned. No osseous fracture or dislocation. No acute or
suspicious osseous lesion. Joint effusion within the suprapatellar
bursa.

Degenerative narrowing of the medial compartment of the right knee,
moderate in degree. Lateral compartment is relatively well
preserved.
IMPRESSION: 1. No acute findings. Joint effusion within the LEFT suprapatellar
bursa, suspected to be chronic.
2. LEFT knee arthroplasty hardware appears intact and appropriately
positioned.
3. Degenerative narrowing of the medial compartment of the RIGHT
knee, moderate in degree.

## 2019-02-05 ENCOUNTER — Other Ambulatory Visit: Payer: Self-pay | Admitting: Family

## 2019-02-06 ENCOUNTER — Other Ambulatory Visit: Payer: Self-pay

## 2019-02-07 ENCOUNTER — Ambulatory Visit (INDEPENDENT_AMBULATORY_CARE_PROVIDER_SITE_OTHER): Payer: Medicare Other | Admitting: Family

## 2019-02-07 ENCOUNTER — Encounter: Payer: Self-pay | Admitting: Family

## 2019-02-07 VITALS — BP 155/99 | HR 81 | Temp 97.3°F | Ht 64.0 in | Wt 177.0 lb

## 2019-02-07 DIAGNOSIS — Z23 Encounter for immunization: Secondary | ICD-10-CM | POA: Diagnosis not present

## 2019-02-07 DIAGNOSIS — Z1211 Encounter for screening for malignant neoplasm of colon: Secondary | ICD-10-CM

## 2019-02-07 DIAGNOSIS — R32 Unspecified urinary incontinence: Secondary | ICD-10-CM

## 2019-02-07 DIAGNOSIS — J4520 Mild intermittent asthma, uncomplicated: Secondary | ICD-10-CM

## 2019-02-07 DIAGNOSIS — F321 Major depressive disorder, single episode, moderate: Secondary | ICD-10-CM

## 2019-02-07 DIAGNOSIS — E1165 Type 2 diabetes mellitus with hyperglycemia: Secondary | ICD-10-CM

## 2019-02-07 DIAGNOSIS — I5022 Chronic systolic (congestive) heart failure: Secondary | ICD-10-CM | POA: Diagnosis not present

## 2019-02-07 DIAGNOSIS — K219 Gastro-esophageal reflux disease without esophagitis: Secondary | ICD-10-CM | POA: Diagnosis not present

## 2019-02-07 DIAGNOSIS — I10 Essential (primary) hypertension: Secondary | ICD-10-CM | POA: Diagnosis not present

## 2019-02-07 DIAGNOSIS — E781 Pure hyperglyceridemia: Secondary | ICD-10-CM | POA: Insufficient documentation

## 2019-02-07 DIAGNOSIS — R5383 Other fatigue: Secondary | ICD-10-CM

## 2019-02-07 DIAGNOSIS — E1169 Type 2 diabetes mellitus with other specified complication: Secondary | ICD-10-CM | POA: Insufficient documentation

## 2019-02-07 DIAGNOSIS — E559 Vitamin D deficiency, unspecified: Secondary | ICD-10-CM

## 2019-02-07 LAB — BAYER DCA HB A1C WAIVED: HB A1C (BAYER DCA - WAIVED): 8 % — ABNORMAL HIGH (ref <7.0)

## 2019-02-07 MED ORDER — ESCITALOPRAM OXALATE 20 MG PO TABS: 20.0000 mg | ORAL_TABLET | Freq: Every day | ORAL | 1 refills | Status: DC

## 2019-02-07 MED ORDER — BISOPROLOL FUMARATE 10 MG PO TABS: 10.0000 mg | ORAL_TABLET | Freq: Every day | ORAL | 1 refills | Status: DC

## 2019-02-07 MED ORDER — VENLAFAXINE HCL ER 37.5 MG PO CP24: ORAL_CAPSULE | ORAL | 0 refills | Status: DC

## 2019-02-07 MED ORDER — RISEDRONATE SODIUM 150 MG PO TABS: 150.0000 mg | ORAL_TABLET | ORAL | 4 refills | Status: DC

## 2019-02-07 NOTE — Progress Notes (Signed)
Subjective:    Patient ID: [name], female    DOB: Apr 10, 1946, 73 y.o.   MRN: [id number]  Chief Complaint  Patient presents with   Medical Management of Chronic Issues   PT presents to the office today for chronic follow up. She is followed by Cardiologists for  CHF annually.  Hypertension This is a chronic problem. The current episode started more than 1 year ago. The problem has been waxing and waning since onset. The problem is uncontrolled. Associated symptoms include malaise/fatigue and peripheral edema ("some times"). Pertinent negatives include no blurred vision or shortness of breath. Headaches: intermittent. Risk factors for coronary artery disease include diabetes mellitus, obesity and sedentary lifestyle. The current treatment provides mild improvement. Hypertensive end-organ damage includes CVA.  Congestive Heart Failure Presents for follow-up visit. Associated symptoms include fatigue and orthopnea. Pertinent negatives include no edema or shortness of breath. The symptoms have been stable.  Asthma There is no cough, shortness of breath or wheezing. This is a chronic problem. The current episode started more than 1 year ago. The problem occurs intermittently. Associated symptoms include heartburn, malaise/fatigue and orthopnea. Headaches: intermittent. Her past medical history is significant for asthma.  Gastroesophageal Reflux She complains of belching and heartburn. She reports no coughing or no wheezing. This is a chronic problem. The problem occurs frequently. The symptoms are aggravated by medications. Associated symptoms include fatigue and orthopnea. She has tried a PPI for the symptoms. The treatment provided moderate relief.  Diabetes She presents for her follow-up diabetic visit. She has type 2 diabetes mellitus. Her disease course has been stable. There are no hypoglycemic associated symptoms. Headaches: intermittent. Associated symptoms include fatigue. Pertinent  negatives for diabetes include no blurred vision, no foot paresthesias and no visual change. There are no hypoglycemic complications. Symptoms are stable. Diabetic complications include a CVA. Risk factors for coronary artery disease include family history, dyslipidemia, diabetes mellitus, hypertension, sedentary lifestyle and post-menopausal. She is following a generally unhealthy diet. (Does not check BS at home ) Eye exam is not current.  Urinary Frequency  This is a chronic problem. The current episode started more than 1 month ago. The problem occurs every urination. The problem has been gradually worsening. The patient is experiencing no pain. Associated symptoms include frequency and urgency. Associated symptoms comments: Urinary incontinence .      Review of Systems  Constitutional: Positive for fatigue and malaise/fatigue.  Eyes: Negative for blurred vision.  Respiratory: Negative for cough, shortness of breath and wheezing.   Gastrointestinal: Positive for heartburn.  Genitourinary: Positive for frequency and urgency.  Neurological: Headaches: intermittent.  All other systems reviewed and are negative.  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: [employer]  Needs   Financial resource strain: Not on file   Food insecurity    Worry: Not on file    Inability: Not on file   Transportation needs     Medical: Not on file    Non-medical: Not on file  Tobacco Use   Smoking status: Never Smoker   Smokeless tobacco: Never Used  Substance and Sexual Activity   Alcohol use: Yes    Comment: rare   Drug use: No   Sexual activity: Never    Birth control/protection: Post-menopausal, Surgical  Lifestyle   Physical activity    Days per week: Not on file    Minutes per session: Not on file   Stress: Not on file  Relationships   Social connections    Talks on phone: Not on file    Gets together: Not on file    Attends religious service: Not on file    Active member of club or organization: Not on file    Attends meetings of clubs or organizations: Not on file    Relationship status: Not on file  Other Topics Concern   Not on file  Social History Narrative   Married "67-'8, divorced; remained single   1 son and 1 daughter   2 grandchildren   Left handed   No caffiene             Objective:   Physical Exam Vitals signs reviewed.  Constitutional:      General: She is not in acute distress.    Appearance: She is well-developed.  HENT:     Head: Normocephalic and atraumatic.     Right Ear: Tympanic membrane normal.     Left Ear: Tympanic membrane normal.  Eyes:     Pupils: Pupils are equal, round, and reactive to light.  Neck:     Musculoskeletal: Normal range of motion and neck supple.     Thyroid: No thyromegaly.  Cardiovascular:     Rate and Rhythm: Normal rate and regular rhythm.     Heart sounds: Normal heart sounds. No murmur.  Pulmonary:     Effort: Pulmonary effort is normal. No respiratory distress.     Breath sounds: Normal breath sounds. No wheezing.  Abdominal:     General: Bowel sounds are normal. There is no distension.     Palpations: Abdomen is soft.     Tenderness: There is no abdominal tenderness.  Musculoskeletal: Normal range of motion.        General: No tenderness.  Skin:    General: Skin is warm and dry.  Neurological:     Mental  Status: She is alert and oriented to person, place, and time.     Cranial Nerves: No cranial nerve deficit.     Deep Tendon Reflexes: Reflexes are normal and symmetric.  Psychiatric:        Behavior: Behavior normal.        Thought Content: Thought content normal.        Judgment: Judgment normal.     Diabetic Foot Exam - Simple   Simple Foot Form Diabetic Foot exam was performed with the following findings: Yes 02/07/2019  9:44 AM  Visual Inspection No deformities, no ulcerations, no other skin breakdown bilaterally: Yes Sensation Testing Intact to touch and monofilament testing bilaterally: Yes Pulse Check Posterior Tibialis and Dorsalis pulse intact bilaterally: Yes Comments      BP (!) 155/99    Pulse 81    Temp (!) 97.3 F (36.3 C) (  Temporal)    Ht 5' 4" (1.626 m)    Wt 177 lb (80.3 kg)    LMP 01/22/1981 (LMP Unknown)    SpO2 96%    BMI 30.38 kg/m      Assessment & Plan:  [name] comes in today with chief complaint of Medical Management of Chronic Issues   Diagnosis and orders addressed:  1. Depression, major, single episode, moderate (HCC) Pt's depression is uncontrolled, we will tamper down lexpro over the next 1-2 weeks and start her on Effexor 37.5 mg for two weeks and tamper up to 75 mg Stress management discussed Adverse effects discussed RTO in 6 weeks  - escitalopram (LEXAPRO) 20 MG tablet; Take 1 tablet (20 mg total) by mouth daily.  Dispense: 30 tablet; Refill: 1 - CMP14+EGFR - CBC with Differential/Platelet - venlafaxine XR (EFFEXOR XR) 37.5 MG 24 hr capsule; Take 1 capsule (37.5 mg total) by mouth daily with breakfast for 14 days, THEN 2 capsules (75 mg total) daily with breakfast for 14 days.  Dispense: 75 capsule; Refill: 0  2. Essential hypertension Pt states she has not taken her meds this morning. She will take her medications and monitor her BP at home. IF constantly greater than 140/90 will call us. We will recheck in 2 weeks in office.    - risedronate (ACTONEL) 150 MG tablet; Take 1 tablet (150 mg total) by mouth every 30 (thirty) days. with water on empty stomach, nothing by mouth or lie down for next 30 minutes.  Dispense: 3 tablet; Refill: 4 - bisoprolol (ZEBETA) 10 MG tablet; Take 1 tablet (10 mg total) by mouth daily. NEEDS APPOINTMENT FOR FURTHER REFILLS  Dispense: 90 tablet; Refill: 1 - CMP14+EGFR - CBC with Differential/Platelet  3. Chronic systolic heart failure (HCC) - CMP14+EGFR - CBC with Differential/Platelet  4. Gastroesophageal reflux disease, unspecified whether esophagitis present - CMP14+EGFR - CBC with Differential/Platelet  5. Uncontrolled type 2 diabetes mellitus with hyperglycemia (HCC) - Bayer DCA Hb A1c Waived - CMP14+EGFR - CBC with Differential/Platelet - Microalbumin / creatinine urine ratio  6. Vitamin D deficiency - CMP14+EGFR - CBC with Differential/Platelet  7. Hypertriglyceridemia - CMP14+EGFR - CBC with Differential/Platelet - Lipid panel  8. Mild intermittent asthma, unspecified whether complicated - CZY60+YTKZ - CBC with Differential/Platelet  9. Fatigue, unspecified type - CMP14+EGFR - CBC with Differential/Platelet - TSH  10. Urinary incontinence, unspecified type Discussed pelvic floor exercises and have placed a referral to [facility] - Ambulatory referral to Urology  11. Colon cancer screening - Ambulatory referral to Gastroenterology  Handicap paperwork completed today for patient.   Labs pending Health Maintenance reviewed Diet and exercise encouraged  Follow up plan: 4 weeks to recheck Depression and HTN   Greater than 40 mins spent with patient discussing medications and lifestyle modifications.     Evelina Dun, FNP

## 2019-02-07 NOTE — Patient Instructions (Signed)

## 2019-02-08 LAB — CMP14+EGFR
ALT: 15 IU/L (ref 0–32)
AST: 19 IU/L (ref 0–40)
Albumin/Globulin Ratio: 2.4 — ABNORMAL HIGH (ref 1.2–2.2)
Albumin: 4.3 g/dL (ref 3.7–4.7)
Alkaline Phosphatase: 56 IU/L (ref 39–117)
BUN/Creatinine Ratio: 19 (ref 12–28)
BUN: 18 mg/dL (ref 8–27)
Bilirubin Total: 0.6 mg/dL (ref 0.0–1.2)
CO2: 25 mmol/L (ref 20–29)
Calcium: 9.4 mg/dL (ref 8.7–10.3)
Chloride: 109 mmol/L — ABNORMAL HIGH (ref 96–106)
Creatinine, Ser: 0.95 mg/dL (ref 0.57–1.00)
GFR calc Af Amer: 68 mL/min/{1.73_m2} (ref >59)
GFR calc non Af Amer: 59 mL/min/{1.73_m2} — ABNORMAL LOW (ref >59)
Globulin, Total: 1.8 g/dL (ref 1.5–4.5)
Glucose: 164 mg/dL — ABNORMAL HIGH (ref 65–99)
Potassium: 3.9 mmol/L (ref 3.5–5.2)
Sodium: 144 mmol/L (ref 134–144)
Total Protein: 6.1 g/dL (ref 6.0–8.5)

## 2019-02-08 LAB — CBC WITH DIFFERENTIAL/PLATELET
Basophils Absolute: 0.1 10*3/uL (ref 0.0–0.2)
Basos: 1 %
EOS (ABSOLUTE): 0.1 10*3/uL (ref 0.0–0.4)
Eos: 2 %
Hematocrit: 40.2 % (ref 34.0–46.6)
Hemoglobin: 13.6 g/dL (ref 11.1–15.9)
Immature Grans (Abs): 0 10*3/uL (ref 0.0–0.1)
Immature Granulocytes: 0 %
Lymphocytes Absolute: 2.1 10*3/uL (ref 0.7–3.1)
Lymphs: 30 %
MCH: 29.6 pg (ref 26.6–33.0)
MCHC: 33.8 g/dL (ref 31.5–35.7)
MCV: 87 fL (ref 79–97)
Monocytes Absolute: 0.5 10*3/uL (ref 0.1–0.9)
Monocytes: 7 %
Neutrophils Absolute: 4.1 10*3/uL (ref 1.4–7.0)
Neutrophils: 60 %
Platelets: 244 10*3/uL (ref 150–450)
RBC: 4.6 x10E6/uL (ref 3.77–5.28)
RDW: 13.2 % (ref 11.7–15.4)
WBC: 6.9 10*3/uL (ref 3.4–10.8)

## 2019-02-08 LAB — LIPID PANEL
Chol/HDL Ratio: 5.1 ratio — ABNORMAL HIGH (ref 0.0–4.4)
Cholesterol, Total: 203 mg/dL — ABNORMAL HIGH (ref 100–199)
HDL: 40 mg/dL (ref >39)
LDL Chol Calc (NIH): 129 mg/dL — ABNORMAL HIGH (ref 0–99)
Triglycerides: 193 mg/dL — ABNORMAL HIGH (ref 0–149)
VLDL Cholesterol Cal: 34 mg/dL (ref 5–40)

## 2019-02-08 LAB — MICROALBUMIN / CREATININE URINE RATIO
Creatinine, Urine: 142.6 mg/dL
Microalb/Creat Ratio: 262 mg/g creat — ABNORMAL HIGH (ref 0–29)
Microalbumin, Urine: 373.9 ug/mL

## 2019-02-08 LAB — TSH: TSH: 2.17 u[IU]/mL (ref 0.450–4.500)

## 2019-02-12 ENCOUNTER — Encounter: Payer: Self-pay | Admitting: Gastroenterology

## 2019-02-13 ENCOUNTER — Other Ambulatory Visit: Payer: Self-pay | Admitting: Family

## 2019-02-13 MED ORDER — METFORMIN HCL ER 500 MG PO TB24: 1000.0000 mg | ORAL_TABLET | Freq: Every day | ORAL | 0 refills | Status: DC

## 2019-02-13 MED ORDER — ATORVASTATIN CALCIUM 20 MG PO TABS: 20.0000 mg | ORAL_TABLET | Freq: Every day | ORAL | 3 refills | Status: DC

## 2019-02-18 DIAGNOSIS — Z1231 Encounter for screening mammogram for malignant neoplasm of breast: Secondary | ICD-10-CM | POA: Diagnosis not present

## 2019-03-10 ENCOUNTER — Other Ambulatory Visit: Payer: Self-pay

## 2019-03-10 NOTE — Patient Outreach (Signed)
[facility]) Care Management  03/10/2019  ICELA BLACKNALL Dec 02, 1945 [id number]   Medication Adherence call to [name] HIPPA Compliant Voice message left with a call back number. Mrs. Mertins is showing past due on Metformin Er 500 mg under [place].   [place] Management Direct Dial [phone]  Fax [phone] [email or web address]

## 2019-03-11 DIAGNOSIS — Z7189 Other specified counseling: Secondary | ICD-10-CM | POA: Insufficient documentation

## 2019-03-11 DIAGNOSIS — R9431 Abnormal electrocardiogram [ECG] [EKG]: Secondary | ICD-10-CM | POA: Insufficient documentation

## 2019-03-11 NOTE — Progress Notes (Signed)
HPI  The patient presents for follow up of dyspnea.  Was previously found to have an EF of 25%.   Her meds were titrated and her EF in Dec of 2017 demonstrated a normal EF. Since I last saw her she she has been having some episodes of dizziness.  She is not feeling palpitations, presyncope or syncope.  Things just started feeling wobbly or off.  She does not really describe it as spinning.  It might be a little bit positional such as when she lies flat.  She is not having any new shortness of breath, PND or orthopnea.  She is not having any chest pressure, neck or arm discomfort.  She has had no weight gain or edema.   Allergies  Allergen Reactions  . Levaquin [Levofloxacin In D5w] Diarrhea  . Penicillins Rash    Current Outpatient Medications  Medication Sig Dispense Refill  . albuterol (PROVENTIL HFA;VENTOLIN HFA) 108 (90 Base) MCG/ACT inhaler Inhale 2 puffs into the lungs every 6 (six) hours as needed. 1 Inhaler 6  . amLODipine (NORVASC) 5 MG tablet Take 1 tablet (5 mg total) by mouth daily. 90 tablet 3  . atorvastatin (LIPITOR) 20 MG tablet Take 1 tablet (20 mg total) by mouth daily. 90 tablet 3  . escitalopram (LEXAPRO) 20 MG tablet Take 1 tablet (20 mg total) by mouth daily. 30 tablet 1  . glucose blood (ONETOUCH VERIO) test strip Use as instructed 100 each 12  . metFORMIN (GLUCOPHAGE-XR) 500 MG 24 hr tablet Take 2 tablets (1,000 mg total) by mouth daily with breakfast. 180 tablet 0  . ONETOUCH DELICA LANCETS FINE MISC Use to check BG once daily 100 each 2  . risedronate (ACTONEL) 150 MG tablet Take 1 tablet (150 mg total) by mouth every 30 (thirty) days. with water on empty stomach, nothing by mouth or lie down for next 30 minutes. 3 tablet 4  . sacubitril-valsartan (ENTRESTO) 97-103 MG Take 1 tablet by mouth 2 (two) times daily. 60 tablet 5  . venlafaxine XR (EFFEXOR XR) 37.5 MG 24 hr capsule Take 1 capsule (37.5 mg total) by mouth daily with breakfast for 14 days, THEN 2 capsules  (75 mg total) daily with breakfast for 14 days. 75 capsule 0  . Vitamin D, Ergocalciferol, (DRISDOL) 50000 units CAPS capsule Take 1 capsule (50,000 Units total) by mouth every 7 (seven) days. 12 capsule 4  . bisoprolol (ZEBETA) 5 MG tablet Take 1 tablet (5 mg total) by mouth daily. 90 tablet 3   No current facility-administered medications for this visit.     Past Medical History:  Diagnosis Date  . Asthma   . Chronic systolic heart failure ([place])    Echo (08/26/13):  Mild LVH. EF 25% to 30%. Diffuse HK. Aortic valve: There was trivial regurgitation. Mitral valve: There was mild regurgitation. Left atrium: The atrium was mildly dilated.  Marland Kitchen GERD (gastroesophageal reflux disease)   . Headache(784.0)   . History of nephrolithiasis   . Hypertension   . NICM (nonischemic cardiomyopathy) ([place])    a. LHC (08/2013):  no CAD  . Stroke ([place])   . Type II or unspecified type diabetes mellitus without mention of complication, uncontrolled    borderline    Past Surgical History:  Procedure Laterality Date  . ABDOMINAL HYSTERECTOMY    . LEFT AND RIGHT HEART CATHETERIZATION WITH CORONARY ANGIOGRAM N/A 09/03/2013   Procedure: LEFT AND RIGHT HEART CATHETERIZATION WITH CORONARY ANGIOGRAM;  Surgeon: [name], MD;  Location:  [facility] CATH [facility];  Service: Cardiovascular;  Laterality: N/A;  . OOPHORECTOMY     removed 1 ovary and 1 ovary remains.  . ureteral extraction of kidney stone      ROS:   As stated in the HPI and negative for all other systems.   PHYSICAL EXAM BP (!) 160/80   Pulse (!) 54   Ht 5\' 4"  (1.626 m)   Wt 175 lb (79.4 kg)   LMP 01/22/1981 (LMP Unknown)   BMI 30.04 kg/m   GENERAL:  Well appearing NECK:  No jugular venous distention, waveform within normal limits, carotid upstroke brisk and symmetric, no bruits, no thyromegaly LUNGS:  Clear to auscultation bilaterally CHEST:  Unremarkable HEART:  PMI not displaced or sustained,S1 and S2 within normal limits, no S3, no S4,  no clicks, no rubs, no murmurs ABD:  Flat, positive bowel sounds normal in frequency in pitch, no bruits, no rebound, no guarding, no midline pulsatile mass, no hepatomegaly, no splenomegaly EXT:  2 plus pulses throughout, no edema, no cyanosis no clubbing    EKG:  Sinus rhythm, rate 54, left axis deviation, left anterior fascicular block, premature ventricular contraction, anterolateral T wave inversions slightly more pronounced than previous, poor anterior R wave progression.  Interestingly, although this T wave inversion pattern was not evident last year he was there the year before.  03/12/2019  ASSESSMENT AND PLAN  CARDIOMYOPATHY:    EF was normal on echo last year but with the T wave inversions I will check another one this year.   DIZZINESS: Because of the dizziness and the low heart rate have been to reduce her bisoprolol to 5 mg and see if she improves.  If not I suggested an ENT follow-up given the fact that she has had problems with her ear and cerumen in the past.  HTN:  The blood pressure is running slightly high but she thinks that time usually significant for blood pressure diary as I go down on this bisoprolol.  I might have to go up on the amlodipine.   SLEEP APNEA:   I do not see that she ever had this.  I will talk to her about this when I see her back in 6 weeks.   COVID EDUCATION: We had a discussion about this and I suggested that she be more vigilant about not being around family but she does have a daughter and young granddaughter.

## 2019-03-12 ENCOUNTER — Ambulatory Visit (INDEPENDENT_AMBULATORY_CARE_PROVIDER_SITE_OTHER): Payer: Medicare Other | Admitting: Cardiology

## 2019-03-12 ENCOUNTER — Other Ambulatory Visit: Payer: Self-pay

## 2019-03-12 ENCOUNTER — Encounter: Payer: Self-pay | Admitting: Cardiology

## 2019-03-12 VITALS — BP 160/80 | HR 54 | Ht 64.0 in | Wt 175.0 lb

## 2019-03-12 DIAGNOSIS — I429 Cardiomyopathy, unspecified: Secondary | ICD-10-CM

## 2019-03-12 DIAGNOSIS — R9431 Abnormal electrocardiogram [ECG] [EKG]: Secondary | ICD-10-CM | POA: Diagnosis not present

## 2019-03-12 DIAGNOSIS — I10 Essential (primary) hypertension: Secondary | ICD-10-CM

## 2019-03-12 DIAGNOSIS — Z7189 Other specified counseling: Secondary | ICD-10-CM | POA: Diagnosis not present

## 2019-03-12 MED ORDER — BISOPROLOL FUMARATE 5 MG PO TABS: 5.0000 mg | ORAL_TABLET | Freq: Every day | ORAL | 3 refills | Status: DC

## 2019-03-12 NOTE — Patient Instructions (Signed)
Medication Instructions:  Please decrease Bisoprolol to 5 mg daily. Continue all other medications as listed.  *If you need a refill on your cardiac medications before your next appointment, please call your pharmacy*  Testing/Procedures: Your physician has requested that you have an echocardiogram. Echocardiography is a painless test that uses sound waves to create images of your heart. It provides your doctor with information about the size and shape of your heart and how well your heart's chambers and valves are working. This procedure takes approximately one hour. There are no restrictions for this procedure.  Follow-Up: At [facility], you and your health needs are our priority.  As part of our continuing mission to provide you with exceptional heart care, we have created designated Provider Care Teams.  These Care Teams include your primary Cardiologist (physician) and Advanced Practice Providers (APPs -  Physician Assistants and Nurse Practitioners) who all work together to provide you with the care you need, when you need it.  Your next appointment:   6 week(s)  The format for your next appointment:   In Person  Provider:   Minus Breeding, MD  Thank you for choosing [facility]!!

## 2019-03-14 ENCOUNTER — Other Ambulatory Visit: Payer: Self-pay

## 2019-03-14 NOTE — Patient Outreach (Signed)
[facility] [facility]) Care Management  03/14/2019  [name] [birth year] [id number]   Medication Adherence call to [name] HIPPA Compliant Voice message left with a call back number. [name] is showing past due on Metformin Er 500 mg under [place].   [facility] Management Direct Dial [phone]  Fax [phone] [email or web address]

## 2019-03-17 ENCOUNTER — Telehealth: Payer: Self-pay | Admitting: Cardiology

## 2019-03-17 ENCOUNTER — Other Ambulatory Visit: Payer: Self-pay

## 2019-03-17 ENCOUNTER — Ambulatory Visit: Payer: Medicare Other

## 2019-03-17 DIAGNOSIS — I10 Essential (primary) hypertension: Secondary | ICD-10-CM

## 2019-03-17 NOTE — Progress Notes (Cosign Needed)
Patient here today to check her blood pressure and compare it to her home monitor. Blood pressure reading with our machine was 196/81, pulse 58.  Blood pressure reading with patient's machine was 192/141, pulse 63.  Patient was instructed to contact Dr. Rosezella [place] office for instruction of what she should do next.

## 2019-03-17 NOTE — Telephone Encounter (Signed)
New Message  Pt c/o BP issue: STAT if pt c/o blurred vision, one-sided weakness or slurred speech  1. What are your last 5 BP readings? 209/100 hr 50, 179/132 hr 55, 221/158 hr 70, 224/100 hr 61  2. Are you having any other symptoms (ex. Dizziness, headache, blurred vision, passed out)? Dizziness  3. What is your BP issue? BP elevated

## 2019-03-17 NOTE — Telephone Encounter (Addendum)
Returned the call to the patient. She was calling to give her blood pressure readings and heart rates since her bisoprolol was decreased to 5 mg daily on 12/2 at her office visit with Dr. Percival Spanish.  The patient bought a new blood pressure cuff that is a wrist machine.   While on the phone her blood pressure was 232/118 and heart rate was 57.  Last week readings were  209/100 HR 50 179/132 HR 55 221/158 HR 70 224/100 HR 61  The patient denies any symptoms other than dizziness at times. She has been advised that she may need to go to the ED to get the blood pressure down but she would rather not.  Message routed to the provider for further recommendations.

## 2019-03-17 NOTE — Telephone Encounter (Signed)
The patient has been called back and advised to go ahead and go to the ED due to her high blood pressures. She stated that she would rather go to her PCP and have them take it to make sure it is accurate. If they state that it is that high then she will go to the ED.   She has been advised that if she cannot get in today then she needs to go ahead and go to the ED and has been educated on the dangers of having elevated blood pressures. She has verbalized her understanding.

## 2019-03-18 ENCOUNTER — Ambulatory Visit (HOSPITAL_COMMUNITY)
Admission: RE | Admit: 2019-03-18 | Discharge: 2019-03-18 | Disposition: A | Discharge status: Home / Self Care | Payer: Medicare Other | Source: Ambulatory Visit | Attending: Family | Admitting: Family

## 2019-03-18 DIAGNOSIS — I10 Essential (primary) hypertension: Secondary | ICD-10-CM | POA: Insufficient documentation

## 2019-03-18 DIAGNOSIS — I429 Cardiomyopathy, unspecified: Secondary | ICD-10-CM | POA: Insufficient documentation

## 2019-03-18 DIAGNOSIS — R9431 Abnormal electrocardiogram [ECG] [EKG]: Secondary | ICD-10-CM | POA: Diagnosis not present

## 2019-03-18 NOTE — Progress Notes (Signed)
*  PRELIMINARY RESULTS* Echocardiogram 2D Echocardiogram has been performed.  Samuel Germany 03/18/2019, 3:08 PM

## 2019-03-18 NOTE — Telephone Encounter (Signed)
Has an ECHO scheduled for today, scheduled appt with K Lawrence@1045  pt will arrive at 1030 to check in wearing a mask. She will bring her BP cuff to review BP readings

## 2019-03-18 NOTE — Telephone Encounter (Signed)
My last understanding was that she was going to the ED.  It doesn't look like this happened.  Need to understand what her BP is doing at home.  She will at least need a virtual visit with one of the APPs today or tomorrow pending her BP readings.

## 2019-03-18 NOTE — Progress Notes (Signed)
Cardiology Office Note   Date:  03/19/2019   ID:  SEQUOYAH Newton, DOB April 05, 1946, MRN [id number]  PCP:  Sharion Balloon, FNP  Cardiologist:  Dr. Percival Spanish  No chief complaint on file.    History of Present Illness: [name] is a 73 y.o. female who presents for ongoing assessment and management of hypertension, chronic dyspnea, nonischemic cardiomyopathy, with probable OSA.  When last seen by Dr. Percival Spanish on 03/12/2019 she had complaints of shortness of breath and was hypertensive.  An echocardiogram was scheduled to evaluate for LV function.  Bisoprolol was decreased to 5 mg in the setting of some mild dizziness.  He also referred her to ENT concerning some complaints of dizziness as she had had serum impaction in the past.   The patient called our office on 03/18/2019 with reports of elevated blood pressure of 232/118 with a heart rate of 57 on a wrist blood pressure cuff, after reduction of bisoprolol.  Blood pressure readings remained elevated during the last week according to her records ranging from 179/132 to a highest of 221/158.  She was advised to be seen in the emergency room due to hypertension.  She chose to wait to see her PCP and let them check her blood pressure in their office.  If she was unable to be seen by PCP, then she would proceed to ED.  Echocardiogram completed on 03/19/2019 revealed a normal LVEF of 60% to 65%.  Grade 1 diastolic dysfunction.  There was no evidence of pulmonary hypertension.  She had no valvular abnormalities.  She comes today with continued complaints of dizziness.  Mostly with position change.  Blood pressure is not much better with change in bisoprolol dosing.  She has not yet seen ENT.  Past Medical History:  Diagnosis Date  . Asthma   . Chronic systolic heart failure ([place])    Echo (08/26/13):  Mild LVH. EF 25% to 30%. Diffuse HK. Aortic valve: There was trivial regurgitation. Mitral valve: There was mild regurgitation. Left atrium: The  atrium was mildly dilated.  Marland Kitchen GERD (gastroesophageal reflux disease)   . Headache(784.0)   . History of nephrolithiasis   . Hypertension   . NICM (nonischemic cardiomyopathy) ([place])    a. LHC (08/2013):  no CAD  . Stroke ([place])   . Type II or unspecified type diabetes mellitus without mention of complication, uncontrolled    borderline    Past Surgical History:  Procedure Laterality Date  . ABDOMINAL HYSTERECTOMY    . LEFT AND RIGHT HEART CATHETERIZATION WITH CORONARY ANGIOGRAM N/A 09/03/2013   Procedure: LEFT AND RIGHT HEART CATHETERIZATION WITH CORONARY ANGIOGRAM;  Surgeon: [name], MD;  Location: [facility] CATH LAB;  Service: Cardiovascular;  Laterality: N/A;  . OOPHORECTOMY     removed 1 ovary and 1 ovary remains.  . ureteral extraction of kidney stone       Current Outpatient Medications  Medication Sig Dispense Refill  . albuterol (PROVENTIL HFA;VENTOLIN HFA) 108 (90 Base) MCG/ACT inhaler Inhale 2 puffs into the lungs every 6 (six) hours as needed. 1 Inhaler 6  . amLODipine (NORVASC) 5 MG tablet Take 1 tablet (5 mg total) by mouth daily. 90 tablet 3  . atorvastatin (LIPITOR) 20 MG tablet Take 1 tablet (20 mg total) by mouth daily. 90 tablet 3  . bisoprolol (ZEBETA) 5 MG tablet Take 1 tablet (5 mg total) by mouth daily. 90 tablet 3  . escitalopram (LEXAPRO) 20 MG tablet Take 1 tablet (20 mg total) by  mouth daily. 30 tablet 1  . glucose blood (ONETOUCH VERIO) test strip Use as instructed 100 each 12  . metFORMIN (GLUCOPHAGE-XR) 500 MG 24 hr tablet Take 2 tablets (1,000 mg total) by mouth daily with breakfast. 180 tablet 0  . ONETOUCH DELICA LANCETS FINE MISC Use to check BG once daily 100 each 2  . risedronate (ACTONEL) 150 MG tablet Take 1 tablet (150 mg total) by mouth every 30 (thirty) days. with water on empty stomach, nothing by mouth or lie down for next 30 minutes. 3 tablet 4  . sacubitril-valsartan (ENTRESTO) 97-103 MG Take 1 tablet by mouth 2 (two) times daily. 60  tablet 5  . Vitamin D, Ergocalciferol, (DRISDOL) 50000 units CAPS capsule Take 1 capsule (50,000 Units total) by mouth every 7 (seven) days. 12 capsule 4  . venlafaxine XR (EFFEXOR XR) 37.5 MG 24 hr capsule Take 1 capsule (37.5 mg total) by mouth daily with breakfast for 14 days, THEN 2 capsules (75 mg total) daily with breakfast for 14 days. 75 capsule 0   No current facility-administered medications for this visit.     Allergies:   Levaquin [levofloxacin in d5w] and Penicillins    Social History:  The patient  reports that she has never smoked. She has never used smokeless tobacco. She reports current alcohol use. She reports that she does not use drugs.   Family History:  The patient's family history includes Arthritis in her mother; CAD (age of onset: 51) in her mother; COPD in her father; Cancer in her sister; Crohn's disease in her sister; Diabetes in her maternal grandmother; Early death (age of onset: 61) in her brother; Hip fracture (age of onset: 42) in her mother; Hypertension in her brother, mother, and sister.    ROS: All other systems are reviewed and negative. Unless otherwise mentioned in H&P    PHYSICAL EXAM: VS:  BP (!) 154/69   Pulse 63   Ht 5\' 4"  (1.626 m)   Wt 177 lb 6.4 oz (80.5 kg)   LMP 01/22/1981 (LMP Unknown)   SpO2 97%   BMI 30.45 kg/m  , BMI Body mass index is 30.45 kg/m. GEN: Well nourished, well developed, in no acute distress HEENT: normal Neck: no JVD, carotid bruits, or masses Cardiac:RRR; no murmurs, rubs, or gallops,no edema  Respiratory:  Clear to auscultation bilaterally, normal work of breathing GI: soft, nontender, nondistended, + BS MS: no deformity or atrophy Skin: warm and dry, no rash Neuro:  Strength and sensation are intact Psych: euthymic mood, full affect   EKG: Not completed today.  Recent Labs: 02/07/2019: ALT 15; BUN 18; Creatinine, Ser 0.95; Hemoglobin 13.6; Platelets 244; Potassium 3.9; Sodium 144; TSH 2.170    Lipid  Panel    Component Value Date/Time   CHOL 203 (H) 02/07/2019 0939   TRIG 193 (H) 02/07/2019 0939   HDL 40 02/07/2019 0939   CHOLHDL 5.1 (H) 02/07/2019 0939   CHOLHDL 4.6 12/17/2013 0353   VLDL 69 (H) 12/17/2013 0353   LDLCALC 129 (H) 02/07/2019 0939      Wt Readings from Last 3 Encounters:  04/08/19 177 lb 6.4 oz (80.5 kg)  03/12/19 175 lb (79.4 kg)  02/07/19 177 lb (80.3 kg)      Other studies Reviewed: Echocardiogram 2019-04-08 . Left ventricular ejection fraction, by visual estimation, is 60 to 65%. The left ventricle has normal function. There is mildly increased left ventricular hypertrophy.  2. Elevated left ventricular end-diastolic pressure.  3. Left ventricular diastolic parameters  are consistent with Grade I diastolic dysfunction (impaired relaxation).  4. The left ventricle has no regional wall motion abnormalities.  5. Global right ventricle has normal systolic function.The right ventricular size is normal. No increase in right ventricular wall thickness.  6. Left atrial size was normal.  7. Right atrial size was normal.  8. The mitral valve is grossly normal. Mild mitral valve regurgitation.  9. The tricuspid valve is grossly normal. Tricuspid valve regurgitation is trivial. 10. The aortic valve is tricuspid. Aortic valve regurgitation is not visualized. 11. The pulmonic valve was grossly normal. Pulmonic valve regurgitation is not visualized. 12. The inferior vena cava is normal in size with greater than 50% respiratory variability, suggesting right atrial pressure of 3 mmHg.  ASSESSMENT AND PLAN:  1.  Hypertension: Blood pressure remains elevated but she has not taken her medication prior to coming in today.  We will continue amlodipine 5 mg and bisoprolol 5 mg.  As directed.  She does not have a home blood pressure cuff that is working correctly.  We have provided her with a blood pressure machine from our office.  She is to take her blood pressure daily and  recorded at the same time each day.  We will see her on follow-up in 1 month to see how she is doing, orthostatics were negative  2.  Nonischemic cardiomyopathy: Echocardiogram reveals significant improvement and LVEF to normal range of 60% to 65%.  She will continue on Entresto as directed as this is helped her considerably return to normal LV function.  For now we will continue her on her current medications with follow-up after taking them.  3.  Chronic dizziness: Should follow-up with ENT as she was not found to be orthostatic on our office visit today.  She verbalizes understanding.  Current medicines are reviewed at length with the patient today.    Labs/ tests ordered today include: None Phill Myron. West Pugh, ANP, AACC   03/19/2019 12:17 PM    [facility] [place] [address] Office [phone] Fax [phone]  Notice: This dictation was prepared with Dragon dictation along with smaller phrase technology. Any transcriptional errors that result from this process are unintentional and may not be corrected upon review.

## 2019-03-19 ENCOUNTER — Ambulatory Visit: Payer: Medicare Other | Admitting: Adult Health

## 2019-03-19 ENCOUNTER — Other Ambulatory Visit: Payer: Self-pay

## 2019-03-19 ENCOUNTER — Encounter: Payer: Self-pay | Admitting: Adult Health

## 2019-03-19 VITALS — BP 154/69 | HR 63 | Ht 64.0 in | Wt 177.4 lb

## 2019-03-19 DIAGNOSIS — I10 Essential (primary) hypertension: Secondary | ICD-10-CM | POA: Diagnosis not present

## 2019-03-19 DIAGNOSIS — R42 Dizziness and giddiness: Secondary | ICD-10-CM | POA: Diagnosis not present

## 2019-03-19 DIAGNOSIS — I43 Cardiomyopathy in diseases classified elsewhere: Secondary | ICD-10-CM | POA: Diagnosis not present

## 2019-03-19 NOTE — Patient Instructions (Signed)
Medication Instructions:  Continue current medications  If you need a refill on your cardiac medications before your next appointment, please call your pharmacy.  Labwork: None Ordered   Testing/Procedures: None Ordered  Follow-Up: 1 Month In Person Minus Breeding, MD.    At [facility], you and your health needs are our priority.  As part of our continuing mission to provide you with exceptional heart care, we have created designated Provider Care Teams.  These Care Teams include your primary Cardiologist (physician) and Advanced Practice Providers (APPs -  Physician Assistants and Nurse Practitioners) who all work together to provide you with the care you need, when you need it.  Thank you for choosing [facility]!!      Happy Holidays!!

## 2019-03-21 ENCOUNTER — Ambulatory Visit: Payer: Medicare Other | Admitting: Gastroenterology

## 2019-03-25 NOTE — Progress Notes (Addendum)
REVIEWED-NO ADDITIONAL RECOMMENDATIONS.  Referring Provider:  Sharion Balloon, FNP Primary Care Physician:  Sharion Balloon, FNP Primary Gastroenterologist:  Dr. Oneida Alar  Chief Complaint  Patient presents with  . Colonoscopy    doing ok, due for tcs    HPI:   [name] is a 73 y.o. female presenting today at the request of   Sharion Balloon, FNP for colon cancer screening.  Past medical history of type 2 diabetes, stroke, nonischemic cardiomyopathy, systolic heart failure although most recent echocardiogram in December 2020 with EF improved to 60-65%, hypertension, and asthma.   Today:  Reports last colonoscopy many years ago, 10-20. Thinks it was at [facility]. Not sure of the physician. Doesn't think she had polyps. No regular abdominal. Occasionally right before she has a BM. Resolves after BM. No constipation or diarrhea. BMs daily. Soft and formed. No blood in the stool. No black stools.   Admits to heartburn. This is daily. Has never been on anything regularly for this. Has taken over the counter prevacid as needed. Admits to eating fried foods worsening heartburn. Drinks coke regularly. No chocolate. No spicy foods. Doesn't eat within 3 hours of laying down. Symptoms are worse during the day. No reflux. No dysphagia.   No fever, chills. Admits to lightheadedness but none lately. Cardiology has referred her to ENT due to concerns for inner ear trouble. Cardiology also adjusted BP medications. Wonders if this also helped as her BP is now under better control. No pre-syncope or syncope. Intermittent chronic chest pain. Has been present for years. No significant change. Not specifically exertional. Cardiology aware. They are following along. Recently saw cardiology on 03/19/19. No palpitations. No shortness of breath at rest, but some with exertion. No regular cough.   She has not started Effexor yet but will be starting soon. Not sure if she will remain on Lexapro long term with  this or not.  May have 1 alcoholic drink a week.  Reports last colonoscopy she was not sedated enough and was in a lot of pain and wants to be sure she is well sedated for this procedure.   Past Medical History:  Diagnosis Date  . Asthma   . Chronic systolic heart failure ([place])    Echo (08/26/13):  Mild LVH. EF 25% to 30%. Diffuse HK. Aortic valve: There was trivial regurgitation. Mitral valve: There was mild regurgitation. Left atrium: The atrium was mildly dilated.  Marland Kitchen GERD (gastroesophageal reflux disease)   . Headache(784.0)   . History of nephrolithiasis   . Hypertension   . NICM (nonischemic cardiomyopathy) ([place])    a. LHC (08/2013):  no CAD  . Stroke ([place])   . Type II or unspecified type diabetes mellitus without mention of complication, uncontrolled    borderline    Past Surgical History:  Procedure Laterality Date  . ABDOMINAL HYSTERECTOMY    . LEFT AND RIGHT HEART CATHETERIZATION WITH CORONARY ANGIOGRAM N/A 09/03/2013   Procedure: LEFT AND RIGHT HEART CATHETERIZATION WITH CORONARY ANGIOGRAM;  Surgeon: [name], MD;  Location: [facility] CATH LAB;  Service: Cardiovascular;  Laterality: N/A;  . OOPHORECTOMY     removed 1 ovary and 1 ovary remains.  . ureteral extraction of kidney stone      Current Outpatient Medications  Medication Sig Dispense Refill  . albuterol (PROVENTIL HFA;VENTOLIN HFA) 108 (90 Base) MCG/ACT inhaler Inhale 2 puffs into the lungs every 6 (six) hours as needed. 1 Inhaler 6  . amLODipine (NORVASC) 5 MG  tablet Take 1 tablet (5 mg total) by mouth daily. 90 tablet 3  . atorvastatin (LIPITOR) 20 MG tablet Take 1 tablet (20 mg total) by mouth daily. 90 tablet 3  . bisoprolol (ZEBETA) 5 MG tablet Take 1 tablet (5 mg total) by mouth daily. 90 tablet 3  . escitalopram (LEXAPRO) 20 MG tablet Take 1 tablet (20 mg total) by mouth daily. 30 tablet 1  . glucose blood (ONETOUCH VERIO) test strip Use as instructed 100 each 12  . metFORMIN (GLUCOPHAGE-XR) 500 MG  24 hr tablet Take 2 tablets (1,000 mg total) by mouth daily with breakfast. 180 tablet 0  . ONETOUCH DELICA LANCETS FINE MISC Use to check BG once daily 100 each 2  . risedronate (ACTONEL) 150 MG tablet Take 1 tablet (150 mg total) by mouth every 30 (thirty) days. with water on empty stomach, nothing by mouth or lie down for next 30 minutes. 3 tablet 4  . sacubitril-valsartan (ENTRESTO) 97-103 MG Take 1 tablet by mouth 2 (two) times daily. 60 tablet 5  . Vitamin D, Ergocalciferol, (DRISDOL) 50000 units CAPS capsule Take 1 capsule (50,000 Units total) by mouth every 7 (seven) days. 12 capsule 4  . polyethylene glycol-electrolytes (NULYTELY/GOLYTELY) 420 g solution As directed 4000 mL 0  . venlafaxine XR (EFFEXOR XR) 37.5 MG 24 hr capsule Take 1 capsule (37.5 mg total) by mouth daily with breakfast for 14 days, THEN 2 capsules (75 mg total) daily with breakfast for 14 days. 75 capsule 0   No current facility-administered medications for this visit.    Allergies as of 03/26/2019 - Review Complete 03/26/2019  Allergen Reaction Noted  . Levaquin [levofloxacin in d5w] Diarrhea 06/08/2014  . Penicillins Rash 07/30/2013    Family History  Problem Relation Age of Onset  . Hypertension Mother   . Arthritis Mother   . CAD Mother 48  . Hip fracture Mother 44  . COPD Father   . Crohn's disease Sister   . Cancer Sister        lung  . Early death Brother 52       house fire  . Diabetes Maternal Grandmother   . Hypertension Sister   . Hypertension Brother   . Colon cancer Neg Hx     Social History   Socioeconomic History  . Marital status: Divorced    Spouse name: Not on file  . Number of children: 2  . Years of education: Not on file  . Highest education level: Not on file  Occupational History  . 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 Smoking status: Never Smoker  . Smokeless tobacco: Never Used  Substance and Sexual Activity  . Alcohol use:  Yes    Comment: rare  . Drug use: No  . Sexual activity: Never    Birth control/protection: Post-menopausal, Surgical  Other Topics Concern  . Not on file  Social History Narrative   Married "67-'8, divorced; remained single   1 son and 1 daughter   2 grandchildren   Left handed   No caffiene         Social Determinants of Health   Financial Resource Strain:   . Difficulty of Paying Living Expenses: Not on file  Food Insecurity:   . Worried About Charity fundraiser in the Last Year: Not on file  . Ran Out of Food in the Last Year: Not on file  Transportation Needs:   . Lack of  Transportation (Medical): Not on file  . Lack of Transportation (Non-Medical): Not on file  Physical Activity:   . Days of Exercise per Week: Not on file  . Minutes of Exercise per Session: Not on file  Stress:   . Feeling of Stress : Not on file  Social Connections:   . Frequency of Communication with Friends and Family: Not on file  . Frequency of Social Gatherings with Friends and Family: Not on file  . Attends Religious Services: Not on file  . Active Member of Clubs or Organizations: Not on file  . Attends Archivist Meetings: Not on file  . Marital Status: Not on file  Intimate Partner Violence:   . Fear of Current or Ex-Partner: Not on file  . Emotionally Abused: Not on file  . Physically Abused: Not on file  . Sexually Abused: Not on file    Review of Systems: Gen: See HPI HEENT: Denies nasal congestion or sore throat. CV: See HPI Resp: See HPI GI: See HPI GU : Denies urinary burning,, frequency, or hesitancy. Admits to some urge incontinence.  MS: Admits to bilateral knee and wrist pain.  Derm: Denies rash Psych: Denies depression, anxiety. Medications keep this well controlled.  Heme: Denies bruising, bleeding  Physical Exam: BP (!) 161/73   Pulse 73   Temp (!) 95.9 F (35.5 C) (Temporal)   Ht 5\' 4"  (1.626 m)   Wt 176 lb 12.8 oz (80.2 kg)   LMP 01/22/1981 (LMP  Unknown)   BMI 30.35 kg/m  General: Alert and oriented. Pleasant and cooperative. Well-nourished and well-developed.  Head:  Normocephalic and atraumatic. Eyes:  Without icterus, sclera clear and conjunctiva pink.  Ears:  Normal auditory acuity. Lungs:  Clear to auscultation bilaterally. No wheezes, rales, or rhonchi. No distress.  Heart:  S1, S2 present without murmurs appreciated.  Abdomen:  +BS, soft, non-tender and non-distended. No HSM noted. No guarding or rebound. No masses appreciated.  Rectal:  Deferred  Msk:  Symmetrical without gross deformities. Normal posture. Extremities:  Without edema. Neurologic:  Alert and  oriented x4;  grossly normal neurologically. Skin:  Intact without significant lesions or rashes. Psych: Normal mood and affect.

## 2019-03-26 ENCOUNTER — Encounter: Payer: Self-pay | Admitting: *Deleted

## 2019-03-26 ENCOUNTER — Encounter: Payer: Self-pay | Admitting: Gastroenterology

## 2019-03-26 ENCOUNTER — Ambulatory Visit: Payer: Medicare Other | Admitting: Gastroenterology

## 2019-03-26 ENCOUNTER — Other Ambulatory Visit: Payer: Self-pay

## 2019-03-26 ENCOUNTER — Other Ambulatory Visit: Payer: Self-pay | Admitting: *Deleted

## 2019-03-26 DIAGNOSIS — Z1211 Encounter for screening for malignant neoplasm of colon: Secondary | ICD-10-CM | POA: Diagnosis not present

## 2019-03-26 DIAGNOSIS — R12 Heartburn: Secondary | ICD-10-CM | POA: Insufficient documentation

## 2019-03-26 MED ORDER — PEG 3350-KCL-NA BICARB-NACL 420 G PO SOLR: ORAL | 0 refills | Status: DC

## 2019-03-26 NOTE — Assessment & Plan Note (Signed)
73 y.o. female with reporting chronic history of heartburn.  Symptoms occurring daily.  Takes over-the-counter Prevacid as needed which does help.  Has never been on any medications chronically.  Denies acid reflux, dysphagia, abdominal pain.  No alarm symptoms.  Suspect heartburn symptoms are likely related to dietary habits as she admits to eating fried foods and drinking throat fairly frequently.  We discussed a GERD diet/lifestyle.  Advised she focus on significant dietary changes over the next 4 weeks and call with a progress report.  If no improvement, would consider starting patient on daily PPI.  Patient was agreeable to this.

## 2019-03-26 NOTE — Patient Instructions (Addendum)
We will get you scheduled for a colonosocpy in the near future with Dr. Oneida Alar.   For your heartburn:  Avoid fried, fatty, greasy, spicy, and citrus foods.  Avoid caffeine, carbonated beverages, and chocolate.  Do not eat within 3 hours of laying down.  You may try eating 4-6 smaller meals a day rather than 3 large meals.  See handout below. Call us in 4 weeks with an update on your symptoms.  We will plan to follow-up with you in the office after your colonoscopy.  Call if you have questions or concerns prior.  Aliene Altes, PA-C [facility]    Food Choices for Gastroesophageal Reflux Disease, Adult When you have gastroesophageal reflux disease (GERD), the foods you eat and your eating habits are very important. Choosing the right foods can help ease your discomfort. Think about working with a nutrition specialist (dietitian) to help you make good choices. What are tips for following this plan?  Meals  Choose healthy foods that are low in fat, such as fruits, vegetables, whole grains, low-fat dairy products, and lean meat, fish, and poultry.  Eat small meals often instead of 3 large meals a day. Eat your meals slowly, and in a place where you are relaxed. Avoid bending over or lying down until 2-3 hours after eating.  Avoid eating meals 2-3 hours before bed.  Avoid drinking a lot of liquid with meals.  Cook foods using methods other than frying. Bake, grill, or broil food instead.  Avoid or limit: ? Chocolate. ? Peppermint or spearmint. ? Alcohol. ? Pepper. ? Black and decaffeinated coffee. ? Black and decaffeinated tea. ? Bubbly (carbonated) soft drinks. ? Caffeinated energy drinks and soft drinks.  Limit high-fat foods such as: ? Fatty meat or fried foods. ? Whole milk, cream, butter, or ice cream. ? Nuts and nut butters. ? Pastries, donuts, and sweets made with butter or shortening.  Avoid foods that cause symptoms. These foods may be different for  everyone. Common foods that cause symptoms include: ? Tomatoes. ? Oranges, lemons, and limes. ? Peppers. ? Spicy food. ? Onions and garlic. ? Vinegar. Lifestyle  Maintain a healthy weight. Ask your doctor what weight is healthy for you. If you need to lose weight, work with your doctor to do so safely.  Exercise for at least 30 minutes for 5 or more days each week, or as told by your doctor.  Wear loose-fitting clothes.  Do not smoke. If you need help quitting, ask your doctor.  Sleep with the head of your bed higher than your feet. Use a wedge under the mattress or blocks under the bed frame to raise the head of the bed. Summary  When you have gastroesophageal reflux disease (GERD), food and lifestyle choices are very important in easing your symptoms.  Eat small meals often instead of 3 large meals a day. Eat your meals slowly, and in a place where you are relaxed.  Limit high-fat foods such as fatty meat or fried foods.  Avoid bending over or lying down until 2-3 hours after eating.  Avoid peppermint and spearmint, caffeine, alcohol, and chocolate. This information is not intended to replace advice given to you by your health care provider. Make sure you discuss any questions you have with your health care provider. Document Released: 09/26/2011 Document Revised: 07/18/2018 Document Reviewed: 05/02/2016 Elsevier Patient Education  2020 [employer].

## 2019-03-26 NOTE — Assessment & Plan Note (Signed)
73 y.o. female with medical history of type 2 diabetes, stroke, nonischemic cardiomyopathy, systolic heart failure although most recent echocardiogram in December 2020 with EF improved to 60-65%, hypertension, and asthma presenting to schedule her colonoscopy. Last colonoscopy 10-20 years ago. Per patient, no polyps. She does state she was not sedated enough and was in a lot of pain during last procedure. Wants to be sure she is well sedated. No significant lower GI symptoms at this time. No alarm symptoms. She does have heartburn as discussed above. No family history of colon cancer.   Proceed with TCS with propofol with Dr. Oneida Alar. The risks, benefits, and alternatives have been discussed in detail with patient. They have stated understanding and desire to proceed.  Propofol due to medications (Lexapro and Effexor) and concerns by patient for inadequate sedation many years ago.  Follow-up after TCS.

## 2019-04-16 ENCOUNTER — Ambulatory Visit: Payer: Medicare Other | Admitting: Cardiology

## 2019-04-29 DIAGNOSIS — R42 Dizziness and giddiness: Secondary | ICD-10-CM | POA: Insufficient documentation

## 2019-04-29 NOTE — Progress Notes (Deleted)
Cardiology Office Note   Date:  04/29/2019   ID:  [name], DOB 29-Oct-1945, MRN [id number]  PCP:  Sharion Balloon, FNP  Cardiologist:   No primary care provider on file. Referring:  ***  No chief complaint on file.     History of Present Illness: [name] is a 74 y.o. female who presents for ongoing assessment and management of hypertension, chronic dyspnea, nonischemic cardiomyopathy, with probable OSA.  Echocardiogram completed on 03/19/2019 revealed a normal LVEF of 60% to 65%.  Grade 1 diastolic dysfunction.  There was no evidence of pulmonary hypertension.  She had no valvular abnormalities.  ***  ***  She comes today with continued complaints of dizziness.  Mostly with position change.  Blood pressure is not much better with change in bisoprolol dosing.  She has not yet seen ENT.   Past Medical History:  Diagnosis Date  . Asthma   . Chronic systolic heart failure ([place])    Echo (08/26/13):  Mild LVH. EF 25% to 30%. Diffuse HK. Aortic valve: There was trivial regurgitation. Mitral valve: There was mild regurgitation. Left atrium: The atrium was mildly dilated.  Marland Kitchen GERD (gastroesophageal reflux disease)   . Headache(784.0)   . History of nephrolithiasis   . Hypertension   . NICM (nonischemic cardiomyopathy) ([place])    a. LHC (08/2013):  no CAD  . Stroke ([place])   . Type II or unspecified type diabetes mellitus without mention of complication, uncontrolled    borderline    Past Surgical History:  Procedure Laterality Date  . ABDOMINAL HYSTERECTOMY    . LEFT AND RIGHT HEART CATHETERIZATION WITH CORONARY ANGIOGRAM N/A 09/03/2013   Procedure: LEFT AND RIGHT HEART CATHETERIZATION WITH CORONARY ANGIOGRAM;  Surgeon: [name], MD;  Location: [facility] CATH LAB;  Service: Cardiovascular;  Laterality: N/A;  . OOPHORECTOMY     removed 1 ovary and 1 ovary remains.  . ureteral extraction of kidney stone       Current Outpatient Medications  Medication Sig  Dispense Refill  . albuterol (PROVENTIL HFA;VENTOLIN HFA) 108 (90 Base) MCG/ACT inhaler Inhale 2 puffs into the lungs every 6 (six) hours as needed. 1 Inhaler 6  . amLODipine (NORVASC) 5 MG tablet Take 1 tablet (5 mg total) by mouth daily. 90 tablet 3  . atorvastatin (LIPITOR) 20 MG tablet Take 1 tablet (20 mg total) by mouth daily. 90 tablet 3  . bisoprolol (ZEBETA) 5 MG tablet Take 1 tablet (5 mg total) by mouth daily. 90 tablet 3  . escitalopram (LEXAPRO) 20 MG tablet Take 1 tablet (20 mg total) by mouth daily. 30 tablet 1  . glucose blood (ONETOUCH VERIO) test strip Use as instructed 100 each 12  . metFORMIN (GLUCOPHAGE-XR) 500 MG 24 hr tablet Take 2 tablets (1,000 mg total) by mouth daily with breakfast. 180 tablet 0  . ONETOUCH DELICA LANCETS FINE MISC Use to check BG once daily 100 each 2  . polyethylene glycol-electrolytes (NULYTELY/GOLYTELY) 420 g solution As directed 4000 mL 0  . risedronate (ACTONEL) 150 MG tablet Take 1 tablet (150 mg total) by mouth every 30 (thirty) days. with water on empty stomach, nothing by mouth or lie down for next 30 minutes. 3 tablet 4  . sacubitril-valsartan (ENTRESTO) 97-103 MG Take 1 tablet by mouth 2 (two) times daily. 60 tablet 5  . venlafaxine XR (EFFEXOR XR) 37.5 MG 24 hr capsule Take 1 capsule (37.5 mg total) by mouth daily with breakfast for 14 days, THEN  2 capsules (75 mg total) daily with breakfast for 14 days. 75 capsule 0  . Vitamin D, Ergocalciferol, (DRISDOL) 50000 units CAPS capsule Take 1 capsule (50,000 Units total) by mouth every 7 (seven) days. 12 capsule 4   No current facility-administered medications for this visit.    Allergies:   Levaquin [levofloxacin in d5w] and Penicillins    ROS:  Please see the history of present illness.   Otherwise, review of systems are positive for {NONE DEFAULTED:18576::"none"}.   All other systems are reviewed and negative.    PHYSICAL EXAM: VS:  LMP 01/22/1981 (LMP Unknown)  , BMI There is no height  or weight on file to calculate BMI. GENERAL:  Well appearing NECK:  No jugular venous distention, waveform within normal limits, carotid upstroke brisk and symmetric, no bruits, no thyromegaly LUNGS:  Clear to auscultation bilaterally CHEST:  Unremarkable HEART:  PMI not displaced or sustained,S1 and S2 within normal limits, no S3, no S4, no clicks, no rubs, *** murmurs ABD:  Flat, positive bowel sounds normal in frequency in pitch, no bruits, no rebound, no guarding, no midline pulsatile mass, no hepatomegaly, no splenomegaly EXT:  2 plus pulses throughout, no edema, no cyanosis no clubbing   EKG:  EKG {ACTION; IS/IS [id number] ordered today. The ekg ordered today demonstrates ***   Recent Labs: 02/07/2019: ALT 15; BUN 18; Creatinine, Ser 0.95; Hemoglobin 13.6; Platelets 244; Potassium 3.9; Sodium 144; TSH 2.170    Lipid Panel    Component Value Date/Time   CHOL 203 (H) 02/07/2019 0939   TRIG 193 (H) 02/07/2019 0939   HDL 40 02/07/2019 0939   CHOLHDL 5.1 (H) 02/07/2019 0939   CHOLHDL 4.6 12/17/2013 0353   VLDL 69 (H) 12/17/2013 0353   LDLCALC 129 (H) 02/07/2019 0939      Wt Readings from Last 3 Encounters:  03/26/19 176 lb 12.8 oz (80.2 kg)  03/19/19 177 lb 6.4 oz (80.5 kg)  03/12/19 175 lb (79.4 kg)      Other studies Reviewed: Additional studies/ records that were reviewed today include: ***. Review of the above records demonstrates:  Please see elsewhere in the note.  ***   ASSESSMENT AND PLAN:  NONISCHEMIC CARDIOMYOPATHY:  EF was normal at last echo.  ***  HTN:  ***  DIZZINESS:  ***  COVID EDUCATION:  ***    Current medicines are reviewed at length with the patient today.  The patient {ACTIONS; HAS/DOES NOT HAVE:19233} concerns regarding medicines.  The following changes have been made:  {PLAN; NO CHANGE:13088:s}  Labs/ tests ordered today include: *** No orders of the defined types were placed in this encounter.    Disposition:   FU with ***     Signed, Minus Breeding, MD  04/29/2019 11:29 AM    [facility]

## 2019-04-30 ENCOUNTER — Ambulatory Visit: Payer: Medicare Other | Admitting: Cardiology

## 2019-05-26 ENCOUNTER — Encounter (HOSPITAL_COMMUNITY): Payer: Self-pay | Admitting: *Deleted

## 2019-05-26 ENCOUNTER — Emergency Department (HOSPITAL_COMMUNITY): Payer: Medicare Other

## 2019-05-26 ENCOUNTER — Emergency Department (HOSPITAL_COMMUNITY)
Admission: EM | Admit: 2019-05-26 | Discharge: 2019-05-26 | Disposition: A | Discharge status: Home / Self Care | Payer: Medicare Other | Attending: Emergency Medicine | Admitting: Emergency Medicine

## 2019-05-26 DIAGNOSIS — Y999 Unspecified external cause status: Secondary | ICD-10-CM | POA: Diagnosis not present

## 2019-05-26 DIAGNOSIS — J45909 Unspecified asthma, uncomplicated: Secondary | ICD-10-CM | POA: Diagnosis not present

## 2019-05-26 DIAGNOSIS — Y93K1 Activity, walking an animal: Secondary | ICD-10-CM | POA: Insufficient documentation

## 2019-05-26 DIAGNOSIS — E119 Type 2 diabetes mellitus without complications: Secondary | ICD-10-CM | POA: Insufficient documentation

## 2019-05-26 DIAGNOSIS — Z7984 Long term (current) use of oral hypoglycemic drugs: Secondary | ICD-10-CM | POA: Diagnosis not present

## 2019-05-26 DIAGNOSIS — I10 Essential (primary) hypertension: Secondary | ICD-10-CM | POA: Diagnosis not present

## 2019-05-26 DIAGNOSIS — I110 Hypertensive heart disease with heart failure: Secondary | ICD-10-CM | POA: Diagnosis not present

## 2019-05-26 DIAGNOSIS — Y9289 Other specified places as the place of occurrence of the external cause: Secondary | ICD-10-CM | POA: Diagnosis not present

## 2019-05-26 DIAGNOSIS — W19XXXA Unspecified fall, initial encounter: Secondary | ICD-10-CM

## 2019-05-26 DIAGNOSIS — R079 Chest pain, unspecified: Secondary | ICD-10-CM | POA: Diagnosis not present

## 2019-05-26 DIAGNOSIS — S299XXA Unspecified injury of thorax, initial encounter: Secondary | ICD-10-CM | POA: Diagnosis present

## 2019-05-26 DIAGNOSIS — S20212A Contusion of left front wall of thorax, initial encounter: Secondary | ICD-10-CM

## 2019-05-26 DIAGNOSIS — E1165 Type 2 diabetes mellitus with hyperglycemia: Secondary | ICD-10-CM | POA: Diagnosis not present

## 2019-05-26 DIAGNOSIS — Z79899 Other long term (current) drug therapy: Secondary | ICD-10-CM | POA: Insufficient documentation

## 2019-05-26 DIAGNOSIS — R0789 Other chest pain: Secondary | ICD-10-CM | POA: Diagnosis not present

## 2019-05-26 DIAGNOSIS — I5022 Chronic systolic (congestive) heart failure: Secondary | ICD-10-CM | POA: Diagnosis not present

## 2019-05-26 DIAGNOSIS — W01198A Fall on same level from slipping, tripping and stumbling with subsequent striking against other object, initial encounter: Secondary | ICD-10-CM | POA: Insufficient documentation

## 2019-05-26 DIAGNOSIS — Z743 Need for continuous supervision: Secondary | ICD-10-CM | POA: Diagnosis not present

## 2019-05-26 MED ORDER — HYDROCODONE-ACETAMINOPHEN 5-325 MG PO TABS
1.0000 | ORAL_TABLET | Freq: Once | ORAL | Status: AC
  Administered 2019-05-26: 1 via ORAL
  Filled 2019-05-26: qty 1

## 2019-05-26 NOTE — ED Notes (Signed)
Pt transported to xray 

## 2019-05-26 NOTE — ED Triage Notes (Signed)
[place] this am while walking the dog , c/o pain in chest .

## 2019-05-26 NOTE — Discharge Instructions (Addendum)
Apply ice packs on and off to your chest wall.  You may take Tylenol 500 mg every 4-6 hours if needed for pain.  Follow-up with your primary provider for recheck.  Return to the emergency department if you develop any worsening symptoms such as increasing chest pain, vomiting or shortness of breath.

## 2019-05-26 NOTE — ED Provider Notes (Signed)
Medical screening examination/treatment/procedure(s) were conducted as a shared visit with non-physician practitioner(s) and myself.  I personally evaluated the patient during the encounter.  EKG Interpretation  Date/Time:  Monday May 26 2019 13:45:25 EST Ventricular Rate:  66 PR Interval:  174 QRS Duration: 106 QT Interval:  452 QTC Calculation: 473 R Axis:   -98 Text Interpretation: Sinus rhythm with occasional Premature ventricular complexes Right superior axis deviation Incomplete right bundle branch block Right ventricular hypertrophy Possible Anterior infarct , age undetermined Abnormal ECG Confirmed by Milton Ferguson [phone]) on 05/26/2019 4:41:35 PM   Patient states dog made her fall.  Patient has chest pain.  Physical exam tender left-sided chest   Milton Ferguson, MD 05/26/19 1656

## 2019-05-26 NOTE — ED Provider Notes (Signed)
[facility] Provider Note   CSN: [id number] Arrival date & time: 05/26/19  1319     History Chief Complaint  Patient presents with  . Fall  . Chest Pain    [name] is a 74 y.o. female.  HPI      [name] is a 74 y.o. female who presents to the Emergency Department complaining of central to left anterior chest wall pain secondary to mechanical fall that occurred this morning.  She states she was walking her dog when the dog pulled her down causing her to fall forward.  She states she struck her chest on the sidewalk and steps.  She complains of pain along her left breast area.  Pain is reproducible to palpation and she states is worse when she raises her left arm or takes a deep breath.  Symptoms associated with nausea at onset, but has since improved.  She denies shortness of breath, head injury,neck pain,  LOC, dizziness, abdominal pain, or vomiting.   Past Medical History:  Diagnosis Date  . Asthma   . Chronic systolic heart failure ([place])    Echo (08/26/13):  Mild LVH. EF 25% to 30%. Diffuse HK. Aortic valve: There was trivial regurgitation. Mitral valve: There was mild regurgitation. Left atrium: The atrium was mildly dilated.  Marland Kitchen GERD (gastroesophageal reflux disease)   . Headache(784.0)   . History of nephrolithiasis   . Hypertension   . NICM (nonischemic cardiomyopathy) ([place])    a. LHC (08/2013):  no CAD  . Stroke ([place])   . Type II or unspecified type diabetes mellitus without mention of complication, uncontrolled    borderline    Patient Active Problem List   Diagnosis Date Noted  . Dizziness 04/29/2019  . Heartburn 03/26/2019  . Colon cancer screening 03/26/2019  . Prolonged QT interval 03/11/2019  . Educated about COVID-19 virus infection 03/11/2019  . Hypertriglyceridemia 02/07/2019  . Snoring 02/06/2018  . Vitamin D deficiency 07/30/2014  . Osteopenia with high risk of fracture 07/30/2014  . Left-sided Bell's palsy  01/16/2014  . CVA (cerebral infarction) 12/16/2013  . NICM (nonischemic cardiomyopathy), Echo 08/26/13-EF 25-30%  09/17/2013  . Chronic systolic heart failure ([place]) 09/17/2013  . Uncontrolled type 2 diabetes mellitus ([place]) 05/05/2010  . TRANSIENT ISCHEMIC ATTACK 05/05/2010  . Essential hypertension 11/12/2007  . Asthma 11/12/2007  . GERD 11/12/2007  . Headache(784.0) 11/12/2007  . NEPHROLITHIASIS, HX OF 11/12/2007    Past Surgical History:  Procedure Laterality Date  . ABDOMINAL HYSTERECTOMY    . LEFT AND RIGHT HEART CATHETERIZATION WITH CORONARY ANGIOGRAM N/A 09/03/2013   Procedure: LEFT AND RIGHT HEART CATHETERIZATION WITH CORONARY ANGIOGRAM;  Surgeon: [name], MD;  Location: [facility] CATH LAB;  Service: Cardiovascular;  Laterality: N/A;  . OOPHORECTOMY     removed 1 ovary and 1 ovary remains.  . ureteral extraction of kidney stone       OB History   No obstetric history on file.     Family History  Problem Relation Age of Onset  . Hypertension Mother   . Arthritis Mother   . CAD Mother 34  . Hip fracture Mother 78  . COPD Father   . Crohn's disease Sister   . Cancer Sister        lung  . Early death Brother 52       house fire  . Diabetes Maternal Grandmother   . Hypertension Sister   . Hypertension Brother   . Colon cancer Neg  Hx     Social History   Tobacco Use  . Smoking status: Never Smoker  . Smokeless tobacco: Never Used  Substance Use Topics  . Alcohol use: Yes    Comment: rare  . Drug use: No    Home Medications Prior to Admission medications   Medication Sig Start Date End Date Taking? Authorizing Provider  albuterol (PROVENTIL HFA;VENTOLIN HFA) 108 (90 Base) MCG/ACT inhaler Inhale 2 puffs into the lungs every 6 (six) hours as needed. 07/05/18   Evelina Dun A, FNP  amLODipine (NORVASC) 5 MG tablet Take 1 tablet (5 mg total) by mouth daily. 01/22/18   Sharion Balloon, FNP  atorvastatin (LIPITOR) 20 MG tablet Take 1 tablet (20 mg  total) by mouth daily. 02/13/19 02/13/20  Evelina Dun A, FNP  bisoprolol (ZEBETA) 5 MG tablet Take 1 tablet (5 mg total) by mouth daily. 03/12/19   Minus Breeding, MD  escitalopram (LEXAPRO) 20 MG tablet Take 1 tablet (20 mg total) by mouth daily. 02/07/19   Evelina Dun A, FNP  glucose blood (ONETOUCH VERIO) test strip Use as instructed 12/24/13   Sharion Balloon, FNP  metFORMIN (GLUCOPHAGE-XR) 500 MG 24 hr tablet Take 2 tablets (1,000 mg total) by mouth daily with breakfast. 02/13/19   Sharion Balloon, FNP  [facility] DELICA LANCETS FINE MISC Use to check BG once daily 01/19/14   Cherre Robins, PharmD  polyethylene glycol-electrolytes (NULYTELY/GOLYTELY) 420 g solution As directed 03/26/19   Danie Binder, MD  risedronate (ACTONEL) 150 MG tablet Take 1 tablet (150 mg total) by mouth every 30 (thirty) days. with water on empty stomach, nothing by mouth or lie down for next 30 minutes. 02/07/19   Hawks, Christy A, FNP  sacubitril-valsartan (ENTRESTO) 97-103 MG Take 1 tablet by mouth 2 (two) times daily. 09/25/18   Minus Breeding, MD  venlafaxine XR (EFFEXOR XR) 37.5 MG 24 hr capsule Take 1 capsule (37.5 mg total) by mouth daily with breakfast for 14 days, THEN 2 capsules (75 mg total) daily with breakfast for 14 days. 02/07/19 03/12/19  Sharion Balloon, FNP  Vitamin D, Ergocalciferol, (DRISDOL) 50000 units CAPS capsule Take 1 capsule (50,000 Units total) by mouth every 7 (seven) days. 01/31/18   Sharion Balloon, FNP    Allergies    Levaquin [levofloxacin in d5w] and Penicillins  Review of Systems   Review of Systems  Constitutional: Negative for chills and fever.  Eyes: Negative for visual disturbance.  Respiratory: Negative for cough, shortness of breath and wheezing.   Cardiovascular: Positive for chest pain.  Gastrointestinal: Positive for nausea. Negative for abdominal pain and vomiting.  Skin: Negative for wound.  Neurological: Negative for dizziness, syncope, speech difficulty,  weakness, numbness and headaches.    Physical Exam Updated Vital Signs BP (!) 185/68 (BP Location: Right Arm)   Pulse 65   Temp 98.5 F (36.9 C) (Oral)   Resp 16   LMP 01/22/1981 (LMP Unknown)   SpO2 99%   Physical Exam Vitals and nursing note reviewed.  Constitutional:      Appearance: Normal appearance. She is not ill-appearing or toxic-appearing.  HENT:     Head: Atraumatic.     Mouth/Throat:     Mouth: Mucous membranes are moist.  Eyes:     Extraocular Movements: Extraocular movements intact.     Pupils: Pupils are equal, round, and reactive to light.  Cardiovascular:     Rate and Rhythm: Normal rate and regular rhythm.     Pulses: Normal  pulses.  Pulmonary:     Effort: Pulmonary effort is normal.     Breath sounds: Normal breath sounds.  Chest:     Chest wall: Tenderness (focal ttp of the anterior left chest and medial left breast.  no crepitus or ecchymosis) present.  Abdominal:     General: There is no distension.     Palpations: Abdomen is soft.     Tenderness: There is no abdominal tenderness. There is no guarding.  Musculoskeletal:        General: Normal range of motion.     Cervical back: Normal range of motion. No tenderness.  Skin:    General: Skin is warm.     Capillary Refill: Capillary refill takes less than 2 seconds.     Findings: No erythema or rash.  Neurological:     General: No focal deficit present.     Mental Status: She is alert.     Sensory: Sensation is intact.     Motor: Motor function is intact.     Coordination: Coordination is intact.     Comments: CN II-XII grossly intact.  Speech clear.       ED Results / Procedures / Treatments   Labs (all labs ordered are listed, but only abnormal results are displayed) Labs Reviewed - No data to display  EKG EKG Interpretation  Date/Time:  Monday May 26 2019 13:45:25 EST Ventricular Rate:  66 PR Interval:  174 QRS Duration: 106 QT Interval:  452 QTC Calculation: 473 R  Axis:   -98 Text Interpretation: Sinus rhythm with occasional Premature ventricular complexes Right superior axis deviation Incomplete right bundle branch block Right ventricular hypertrophy Possible Anterior infarct , age undetermined Abnormal ECG Confirmed by Milton Ferguson [id number]) on 05/26/2019 4:41:35 PM   Radiology No results found.  Procedures Procedures (including critical care time)  Medications Ordered in ED Medications  HYDROcodone-acetaminophen (NORCO/VICODIN) 5-325 MG per tablet 1 tablet (has no administration in time range)    ED Course  I have reviewed the triage vital signs and the nursing notes.  Pertinent labs & imaging results that were available during my care of the patient were reviewed by me and considered in my medical decision making (see chart for details).    MDM Rules/Calculators/A&P                     Patient with mechanical fall this morning landing on her chest.  She complains of anterior chest wall pain that is reproducible with palpation.  Likely musculoskeletal injury.  Vital signs reassuring.  EKG is without acute changes and CXR is w/o acute bony changes.  Patient agrees to Tylenol and application of ice.  She will follow-up with PCP.  Return precautions discussed.  Pt also seen by Dr. Roderic Palau and care plan discussed.    Final Clinical Impression(s) / ED Diagnoses Final diagnoses:  Contusion of left chest wall, initial encounter  Fall, initial encounter    Rx / DC Orders ED Discharge Orders    None       Bufford Lope 05/28/19 2259    Milton Ferguson, MD 05/31/19 [phone]

## 2019-06-02 NOTE — Progress Notes (Signed)
Cardiology Office Note   Date:  06/04/2019   ID:  KENSLY FARIN, DOB 1945/06/12, MRN [id number]  PCP:  Sharion Balloon, FNP  Cardiologist:   No primary care provider on file.  Chief Complaint  Patient presents with  . Cardiomyopathy      History of Present Illness: [name] is a 74 y.o. female who presents  for ongoing assessment and management of hypertension, chronic dyspnea, nonischemic cardiomyopathy, with probable OSA.  When last seen by Dr. Percival Spanish on 03/12/2019 she had complaints of shortness of breath and was hypertensive.  An echocardiogram was scheduled to evaluate for LV function.  Bisoprolol was decreased to 5 mg in the setting of some mild dizziness.  He also referred her to ENT concerning some complaints of dizziness as she had had cerumen impaction in the past.  She subsquently had hypertension.  Echocardiogram completed on 03/19/2019 revealed a normal LVEF of 60% to 65%.  Grade 1 diastolic dysfunction.  There was no evidence of pulmonary hypertension.  She had no valvular abnormalities.  At the time that her blood pressure was significantly elevated when she called late year she was not really taking her medicines consistently.  She has not started taking them consistently and her blood pressure is well controlled.  She has had no new cardiovascular complaints.  She did have an injury because she was pulled to the ground when she was trying to walk her daughters [place].  She has some muscle soreness related to this.   Past Medical History:  Diagnosis Date  . Asthma   . Chronic systolic heart failure ([place])    Echo (08/26/13):  Mild LVH. EF 25% to 30%. Diffuse HK. Aortic valve: There was trivial regurgitation. Mitral valve: There was mild regurgitation. Left atrium: The atrium was mildly dilated.  Marland Kitchen GERD (gastroesophageal reflux disease)   . Headache(784.0)   . History of nephrolithiasis   . Hypertension   . NICM (nonischemic cardiomyopathy) ([place])    a.  LHC (08/2013):  no CAD  . Stroke ([place])   . Type II or unspecified type diabetes mellitus without mention of complication, uncontrolled    borderline    Past Surgical History:  Procedure Laterality Date  . ABDOMINAL HYSTERECTOMY    . LEFT AND RIGHT HEART CATHETERIZATION WITH CORONARY ANGIOGRAM N/A 09/03/2013   Procedure: LEFT AND RIGHT HEART CATHETERIZATION WITH CORONARY ANGIOGRAM;  Surgeon: [name], MD;  Location: [facility] CATH LAB;  Service: Cardiovascular;  Laterality: N/A;  . OOPHORECTOMY     removed 1 ovary and 1 ovary remains.  . ureteral extraction of kidney stone       Current Outpatient Medications  Medication Sig Dispense Refill  . albuterol (PROVENTIL HFA;VENTOLIN HFA) 108 (90 Base) MCG/ACT inhaler Inhale 2 puffs into the lungs every 6 (six) hours as needed. 1 Inhaler 6  . amLODipine (NORVASC) 5 MG tablet Take 1 tablet (5 mg total) by mouth daily. 90 tablet 3  . bisoprolol (ZEBETA) 5 MG tablet Take 1 tablet (5 mg total) by mouth daily. 90 tablet 3  . escitalopram (LEXAPRO) 20 MG tablet Take 1 tablet (20 mg total) by mouth daily. 30 tablet 1  . metFORMIN (GLUCOPHAGE-XR) 500 MG 24 hr tablet Take 2 tablets (1,000 mg total) by mouth daily with breakfast. 180 tablet 0  . sacubitril-valsartan (ENTRESTO) 97-103 MG Take 1 tablet by mouth 2 (two) times daily. 60 tablet 5  . Vitamin D, Ergocalciferol, (DRISDOL) 50000 units CAPS capsule Take  1 capsule (50,000 Units total) by mouth every 7 (seven) days. 12 capsule 4  . atorvastatin (LIPITOR) 20 MG tablet Take 1 tablet (20 mg total) by mouth daily. (Patient not taking: Reported on 06/04/2019) 90 tablet 3  . glucose blood (ONETOUCH VERIO) test strip Use as instructed 100 each 12  . ONETOUCH DELICA LANCETS FINE MISC Use to check BG once daily 100 each 2  . polyethylene glycol-electrolytes (NULYTELY/GOLYTELY) 420 g solution As directed 4000 mL 0   No current facility-administered medications for this visit.    Allergies:   Levaquin  [levofloxacin in d5w] and Penicillins    ROS:  Please see the history of present illness.   Otherwise, review of systems are positive for none.   All other systems are reviewed and negative.    PHYSICAL EXAM: VS:  BP 140/70   Pulse (!) 52   Ht 5\' 4"  (1.626 m)   Wt 175 lb (79.4 kg)   LMP 01/22/1981 (LMP Unknown)   BMI 30.04 kg/m  , BMI Body mass index is 30.04 kg/m. GENERAL:  Well appearing NECK:  No jugular venous distention, waveform within normal limits, carotid upstroke brisk and symmetric, no bruits, no thyromegaly LUNGS:  Clear to auscultation bilaterally CHEST:  Unremarkable HEART:  PMI not displaced or sustained,S1 and S2 within normal limits, no S3, no S4, no clicks, no rubs, no murmurs ABD:  Flat, positive bowel sounds normal in frequency in pitch, no bruits, no rebound, no guarding, no midline pulsatile mass, no hepatomegaly, no splenomegaly EXT:  2 plus pulses throughout, no edema, no cyanosis no clubbing   EKG:  EKG is not ordered today.    Recent Labs: 02/07/2019: ALT 15; BUN 18; Creatinine, Ser 0.95; Hemoglobin 13.6; Platelets 244; Potassium 3.9; Sodium 144; TSH 2.170    Lipid Panel    Component Value Date/Time   CHOL 203 (H) 02/07/2019 0939   TRIG 193 (H) 02/07/2019 0939   HDL 40 02/07/2019 0939   CHOLHDL 5.1 (H) 02/07/2019 0939   CHOLHDL 4.6 12/17/2013 0353   VLDL 69 (H) 12/17/2013 0353   LDLCALC 129 (H) 02/07/2019 0939      Wt Readings from Last 3 Encounters:  06/04/19 175 lb (79.4 kg)  03/26/19 176 lb 12.8 oz (80.2 kg)  03/19/19 177 lb 6.4 oz (80.5 kg)      Other studies Reviewed: Additional studies/ records that were reviewed today include: None Review of the above records demonstrates:  Please see elsewhere in the note.     ASSESSMENT AND PLAN:  Hypertension:    Her blood pressure is well controlled.  It is slightly elevated today but she says is never this high at home now that she is taking her medications.  Therefore, no change in  therapy.   Nonischemic cardiomyopathy:   Her ejection fraction most recently was back to 60 to 65% from a low of 30%.  No change in therapy.   Chronic dizziness:     She has some mild orthostatic symptoms but otherwise no change in this complaint presyncope or syncope.  No further work-up.   Covid education: She is on a waiting list for the vaccine.   Current medicines are reviewed at length with the patient today.  The patient does not have concerns regarding medicines.  The following changes have been made:  no change  Labs/ tests ordered today include: None No orders of the defined types were placed in this encounter.    Disposition:   FU with me  in 12 months.     Signed, Minus Breeding, MD  06/04/2019 2:02 PM    [place]

## 2019-06-04 ENCOUNTER — Encounter: Payer: Self-pay | Admitting: Cardiology

## 2019-06-04 ENCOUNTER — Other Ambulatory Visit: Payer: Self-pay

## 2019-06-04 ENCOUNTER — Ambulatory Visit (INDEPENDENT_AMBULATORY_CARE_PROVIDER_SITE_OTHER): Payer: Medicare Other | Admitting: Cardiology

## 2019-06-04 VITALS — BP 140/70 | HR 52 | Ht 64.0 in | Wt 175.0 lb

## 2019-06-04 DIAGNOSIS — I428 Other cardiomyopathies: Secondary | ICD-10-CM

## 2019-06-04 DIAGNOSIS — R42 Dizziness and giddiness: Secondary | ICD-10-CM | POA: Diagnosis not present

## 2019-06-04 DIAGNOSIS — Z7189 Other specified counseling: Secondary | ICD-10-CM | POA: Diagnosis not present

## 2019-06-04 DIAGNOSIS — I10 Essential (primary) hypertension: Secondary | ICD-10-CM

## 2019-06-04 NOTE — Patient Instructions (Signed)
Medication Instructions:  The current medical regimen is effective;  continue present plan and medications.  *If you need a refill on your cardiac medications before your next appointment, please call your pharmacy*  Follow-Up: At [facility], you and your health needs are our priority.  As part of our continuing mission to provide you with exceptional heart care, we have created designated Provider Care Teams.  These Care Teams include your primary Cardiologist (physician) and Advanced Practice Providers (APPs -  Physician Assistants and Nurse Practitioners) who all work together to provide you with the care you need, when you need it.  Your next appointment:   12 month(s)  The format for your next appointment:   In Person  Provider:   James Hochrein, MD  Thank you for choosing [facility]!!     

## 2019-06-08 ENCOUNTER — Ambulatory Visit: Payer: Medicare Other | Attending: Internal Medicine

## 2019-06-08 DIAGNOSIS — Z23 Encounter for immunization: Secondary | ICD-10-CM

## 2019-06-08 NOTE — Progress Notes (Signed)
   Covid-19 Vaccination Clinic  Name:  [name]    MRN: [id number] DOB: 04/04/1946  06/08/2019  [name] was observed post Covid-19 immunization for 15 minutes without incidence. She was provided with Vaccine Information Sheet and instruction to access the V-Safe system.   [name] was instructed to call 911 with any severe reactions post vaccine: Marland Kitchen Difficulty breathing  . Swelling of your face and throat  . A fast heartbeat  . A bad rash all over your body  . Dizziness and weakness    Immunizations Administered    Name Date Dose VIS Date Route   Pfizer COVID-19 Vaccine 06/08/2019  1:12 PM 0.3 mL 03/21/2019 Intramuscular   Manufacturer: [place]   Lot: [id number]   [place]: [id number]

## 2019-06-25 NOTE — Patient Instructions (Signed)
[name]  06/25/2019     @PREFPERIOPPHARMACY @   Your procedure is scheduled on  07/01/2019   Report to [facility] at  1245  P.M.  Call this number if you have problems the morning of surgery:  [phone]   Remember:  Do not eat or drink after midnight.                        Take these medicines the morning of surgery with A SIP OF WATER  Amlodipine, bisprolol, lexapro, entresto. Use your inhaler before you come.    Do not wear jewelry, make-up or nail polish.  Do not wear lotions, powders, or perfumes. Please wear deodorant and brush your teeth.  Do not shave 48 hours prior to surgery.  Men may shave face and neck.  Do not bring valuables to the hospital.  [facility] is not responsible for any belongings or valuables.  Contacts, dentures or bridgework may not be worn into surgery.  Leave your suitcase in the car.  After surgery it may be brought to your room.  For patients admitted to the hospital, discharge time will be determined by your treatment team.  Patients discharged the day of surgery will not be allowed to drive home.   Name and phone number of your driver:   family Special instructions:  DO NOT smoke the morning of your procedure.  Please read over the following fact sheets that you were given. Anesthesia Post-op Instructions and Care and Recovery After Surgery       Colonoscopy, Adult, Care After This sheet gives you information about how to care for yourself after your procedure. Your health care provider may also give you more specific instructions. If you have problems or questions, contact your health care provider. What can I expect after the procedure? After the procedure, it is common to have:  A small amount of blood in your stool for 24 hours after the procedure.  Some gas.  Mild cramping or bloating of your abdomen. Follow these instructions at home: Eating and drinking   Drink enough fluid to keep your urine pale  yellow.  Follow instructions from your health care provider about eating or drinking restrictions.  Resume your normal diet as instructed by your health care provider. Avoid heavy or fried foods that are hard to digest. Activity  Rest as told by your health care provider.  Avoid sitting for a long time without moving. Get up to take short walks every 1-2 hours. This is important to improve blood flow and breathing. Ask for help if you feel weak or unsteady.  Return to your normal activities as told by your health care provider. Ask your health care provider what activities are safe for you. Managing cramping and bloating   Try walking around when you have cramps or feel bloated.  Apply heat to your abdomen as told by your health care provider. Use the heat source that your health care provider recommends, such as a moist heat pack or a heating pad. ? Place a towel between your skin and the heat source. ? Leave the heat on for 20-30 minutes. ? Remove the heat if your skin turns bright red. This is especially important if you are unable to feel pain, heat, or cold. You may have a greater risk of getting burned. General instructions  For the first 24 hours after the procedure: ? Do not drive or use  machinery. ? Do not sign important documents. ? Do not drink alcohol. ? Do your regular daily activities at a slower pace than normal. ? Eat soft foods that are easy to digest.  Take over-the-counter and prescription medicines only as told by your health care provider.  Keep all follow-up visits as told by your health care provider. This is important. Contact a health care provider if:  You have blood in your stool 2-3 days after the procedure. Get help right away if you have:  More than a small spotting of blood in your stool.  Large blood clots in your stool.  Swelling of your abdomen.  Nausea or vomiting.  A fever.  Increasing pain in your abdomen that is not relieved with  medicine. Summary  After the procedure, it is common to have a small amount of blood in your stool. You may also have mild cramping and bloating of your abdomen.  For the first 24 hours after the procedure, do not drive or use machinery, sign important documents, or drink alcohol.  Get help right away if you have a lot of blood in your stool, nausea or vomiting, a fever, or increased pain in your abdomen. This information is not intended to replace advice given to you by your health care provider. Make sure you discuss any questions you have with your health care provider. Document Revised: 10/21/2018 Document Reviewed: 10/21/2018 Elsevier Patient Education  [place] [facility] These instructions provide you with information about caring for yourself after your procedure. Your health care provider may also give you more specific instructions. Your treatment has been planned according to current medical practices, but problems sometimes occur. Call your health care provider if you have any problems or questions after your procedure. What can I expect after the procedure? After your procedure, you may:  Feel sleepy for several hours.  Feel clumsy and have poor balance for several hours.  Feel forgetful about what happened after the procedure.  Have poor judgment for several hours.  Feel nauseous or vomit.  Have a sore throat if you had a breathing tube during the procedure. Follow these instructions at home: For at least 24 hours after the procedure:      Have a responsible adult stay with you. It is important to have someone help care for you until you are awake and alert.  Rest as needed.  Do not: ? Participate in activities in which you could fall or become injured. ? Drive. ? Use heavy machinery. ? Drink alcohol. ? Take sleeping pills or medicines that cause drowsiness. ? Make important decisions or sign legal documents. ? Take care  of children on your own. Eating and drinking  Follow the diet that is recommended by your health care provider.  If you vomit, drink water, juice, or soup when you can drink without vomiting.  Make sure you have little or no nausea before eating solid foods. General instructions  Take over-the-counter and prescription medicines only as told by your health care provider.  If you have sleep apnea, surgery and certain medicines can increase your risk for breathing problems. Follow instructions from your health care provider about wearing your sleep device: ? Anytime you are sleeping, including during daytime naps. ? While taking prescription pain medicines, sleeping medicines, or medicines that make you drowsy.  If you smoke, do not smoke without supervision.  Keep all follow-up visits as told by your health care provider. This is important. Contact  a health care provider if:  You keep feeling nauseous or you keep vomiting.  You feel light-headed.  You develop a rash.  You have a fever. Get help right away if:  You have trouble breathing. Summary  For several hours after your procedure, you may feel sleepy and have poor judgment.  Have a responsible adult stay with you for at least 24 hours or until you are awake and alert. This information is not intended to replace advice given to you by your health care provider. Make sure you discuss any questions you have with your health care provider. Document Revised: 06/25/2017 Document Reviewed: 07/18/2015 Elsevier Patient Education  [place].

## 2019-06-27 ENCOUNTER — Other Ambulatory Visit: Payer: Self-pay

## 2019-06-27 ENCOUNTER — Encounter (HOSPITAL_COMMUNITY): Payer: Self-pay

## 2019-06-27 ENCOUNTER — Other Ambulatory Visit (HOSPITAL_COMMUNITY)
Admission: RE | Admit: 2019-06-27 | Discharge: 2019-06-27 | Disposition: A | Discharge status: Home / Self Care | Payer: Medicare Other | Source: Ambulatory Visit | Attending: Internal Medicine | Admitting: Internal Medicine

## 2019-06-27 ENCOUNTER — Encounter (HOSPITAL_COMMUNITY)
Admission: RE | Admit: 2019-06-27 | Discharge: 2019-06-27 | Disposition: A | Discharge status: Home / Self Care | Payer: Medicare Other | Source: Ambulatory Visit | Attending: Gastroenterology | Admitting: Gastroenterology

## 2019-06-27 DIAGNOSIS — Z01812 Encounter for preprocedural laboratory examination: Secondary | ICD-10-CM | POA: Insufficient documentation

## 2019-06-27 DIAGNOSIS — Z20822 Contact with and (suspected) exposure to covid-19: Secondary | ICD-10-CM | POA: Diagnosis not present

## 2019-06-27 HISTORY — DX: Sleep apnea, unspecified: G47.30

## 2019-06-27 LAB — BASIC METABOLIC PANEL
Anion gap: 6 (ref 5–15)
BUN: 25 mg/dL — ABNORMAL HIGH (ref 8–23)
CO2: 25 mmol/L (ref 22–32)
Calcium: 9 mg/dL (ref 8.9–10.3)
Chloride: 111 mmol/L (ref 98–111)
Creatinine, Ser: 1.09 mg/dL — ABNORMAL HIGH (ref 0.44–1.00)
GFR calc Af Amer: 58 mL/min — ABNORMAL LOW (ref >60)
GFR calc non Af Amer: 50 mL/min — ABNORMAL LOW (ref >60)
Glucose, Bld: 216 mg/dL — ABNORMAL HIGH (ref 70–99)
Potassium: 3.4 mmol/L — ABNORMAL LOW (ref 3.5–5.1)
Sodium: 142 mmol/L (ref 135–145)

## 2019-06-28 LAB — SARS CORONAVIRUS 2 (TAT 6-24 HRS): SARS Coronavirus 2: NEGATIVE

## 2019-06-30 ENCOUNTER — Telehealth: Payer: Self-pay

## 2019-06-30 NOTE — Telephone Encounter (Signed)
Called pt to see if she could arrive earlier for TCS tomorrow. Pt doesn't want to change procedure time. Endo scheduler informed.

## 2019-07-01 ENCOUNTER — Other Ambulatory Visit: Payer: Self-pay

## 2019-07-01 ENCOUNTER — Encounter (HOSPITAL_COMMUNITY)
Admission: RE | Disposition: A | Discharge status: Home / Self Care | Payer: Self-pay | Source: Home / Self Care | Attending: Gastroenterology

## 2019-07-01 ENCOUNTER — Ambulatory Visit (HOSPITAL_COMMUNITY)
Admission: RE | Admit: 2019-07-01 | Discharge: 2019-07-01 | Disposition: A | Discharge status: Home / Self Care | Payer: Medicare Other | Attending: Gastroenterology | Admitting: Gastroenterology

## 2019-07-01 ENCOUNTER — Ambulatory Visit (HOSPITAL_COMMUNITY): Payer: Medicare Other | Admitting: Certified Registered Nurse Anesthetist

## 2019-07-01 ENCOUNTER — Encounter (HOSPITAL_COMMUNITY): Payer: Self-pay | Admitting: Gastroenterology

## 2019-07-01 DIAGNOSIS — Z8261 Family history of arthritis: Secondary | ICD-10-CM | POA: Insufficient documentation

## 2019-07-01 DIAGNOSIS — K635 Polyp of colon: Secondary | ICD-10-CM | POA: Diagnosis not present

## 2019-07-01 DIAGNOSIS — Z8249 Family history of ischemic heart disease and other diseases of the circulatory system: Secondary | ICD-10-CM | POA: Diagnosis not present

## 2019-07-01 DIAGNOSIS — I5022 Chronic systolic (congestive) heart failure: Secondary | ICD-10-CM | POA: Diagnosis not present

## 2019-07-01 DIAGNOSIS — Z888 Allergy status to other drugs, medicaments and biological substances status: Secondary | ICD-10-CM | POA: Insufficient documentation

## 2019-07-01 DIAGNOSIS — D123 Benign neoplasm of transverse colon: Secondary | ICD-10-CM | POA: Insufficient documentation

## 2019-07-01 DIAGNOSIS — I428 Other cardiomyopathies: Secondary | ICD-10-CM | POA: Diagnosis not present

## 2019-07-01 DIAGNOSIS — E119 Type 2 diabetes mellitus without complications: Secondary | ICD-10-CM | POA: Diagnosis not present

## 2019-07-01 DIAGNOSIS — J45909 Unspecified asthma, uncomplicated: Secondary | ICD-10-CM | POA: Diagnosis not present

## 2019-07-01 DIAGNOSIS — Z90710 Acquired absence of both cervix and uterus: Secondary | ICD-10-CM | POA: Insufficient documentation

## 2019-07-01 DIAGNOSIS — K644 Residual hemorrhoidal skin tags: Secondary | ICD-10-CM | POA: Diagnosis not present

## 2019-07-01 DIAGNOSIS — K648 Other hemorrhoids: Secondary | ICD-10-CM | POA: Insufficient documentation

## 2019-07-01 DIAGNOSIS — Z79899 Other long term (current) drug therapy: Secondary | ICD-10-CM | POA: Insufficient documentation

## 2019-07-01 DIAGNOSIS — D128 Benign neoplasm of rectum: Secondary | ICD-10-CM | POA: Diagnosis not present

## 2019-07-01 DIAGNOSIS — Q438 Other specified congenital malformations of intestine: Secondary | ICD-10-CM | POA: Diagnosis not present

## 2019-07-01 DIAGNOSIS — G4730 Sleep apnea, unspecified: Secondary | ICD-10-CM | POA: Insufficient documentation

## 2019-07-01 DIAGNOSIS — Z87442 Personal history of urinary calculi: Secondary | ICD-10-CM | POA: Insufficient documentation

## 2019-07-01 DIAGNOSIS — Z833 Family history of diabetes mellitus: Secondary | ICD-10-CM | POA: Insufficient documentation

## 2019-07-01 DIAGNOSIS — Z8379 Family history of other diseases of the digestive system: Secondary | ICD-10-CM | POA: Insufficient documentation

## 2019-07-01 DIAGNOSIS — K5730 Diverticulosis of large intestine without perforation or abscess without bleeding: Secondary | ICD-10-CM | POA: Diagnosis not present

## 2019-07-01 DIAGNOSIS — R519 Headache, unspecified: Secondary | ICD-10-CM | POA: Insufficient documentation

## 2019-07-01 DIAGNOSIS — K621 Rectal polyp: Secondary | ICD-10-CM | POA: Insufficient documentation

## 2019-07-01 DIAGNOSIS — I110 Hypertensive heart disease with heart failure: Secondary | ICD-10-CM | POA: Diagnosis not present

## 2019-07-01 DIAGNOSIS — Z1211 Encounter for screening for malignant neoplasm of colon: Secondary | ICD-10-CM | POA: Diagnosis not present

## 2019-07-01 DIAGNOSIS — Z90721 Acquired absence of ovaries, unilateral: Secondary | ICD-10-CM | POA: Insufficient documentation

## 2019-07-01 DIAGNOSIS — Z825 Family history of asthma and other chronic lower respiratory diseases: Secondary | ICD-10-CM | POA: Insufficient documentation

## 2019-07-01 DIAGNOSIS — Z880 Allergy status to penicillin: Secondary | ICD-10-CM | POA: Diagnosis not present

## 2019-07-01 DIAGNOSIS — Z801 Family history of malignant neoplasm of trachea, bronchus and lung: Secondary | ICD-10-CM | POA: Insufficient documentation

## 2019-07-01 DIAGNOSIS — D122 Benign neoplasm of ascending colon: Secondary | ICD-10-CM | POA: Insufficient documentation

## 2019-07-01 DIAGNOSIS — Z7984 Long term (current) use of oral hypoglycemic drugs: Secondary | ICD-10-CM | POA: Insufficient documentation

## 2019-07-01 DIAGNOSIS — K219 Gastro-esophageal reflux disease without esophagitis: Secondary | ICD-10-CM | POA: Diagnosis not present

## 2019-07-01 DIAGNOSIS — D125 Benign neoplasm of sigmoid colon: Secondary | ICD-10-CM | POA: Diagnosis not present

## 2019-07-01 DIAGNOSIS — Z8673 Personal history of transient ischemic attack (TIA), and cerebral infarction without residual deficits: Secondary | ICD-10-CM | POA: Insufficient documentation

## 2019-07-01 HISTORY — PX: COLONOSCOPY WITH PROPOFOL: SHX5780

## 2019-07-01 HISTORY — PX: POLYPECTOMY: SHX5525

## 2019-07-01 LAB — GLUCOSE, CAPILLARY: Glucose-Capillary: 133 mg/dL — ABNORMAL HIGH (ref 70–99)

## 2019-07-01 SURGERY — COLONOSCOPY WITH PROPOFOL
Anesthesia: General

## 2019-07-01 MED ORDER — LACTATED RINGERS IV SOLN: INTRAVENOUS | Status: DC

## 2019-07-01 MED ORDER — KETAMINE HCL 50 MG/5ML IJ SOSY
PREFILLED_SYRINGE | INTRAMUSCULAR | Status: AC
  Filled 2019-07-01: qty 5

## 2019-07-01 MED ORDER — CHLORHEXIDINE GLUCONATE CLOTH 2 % EX PADS: 6.0000 | MEDICATED_PAD | Freq: Once | CUTANEOUS | Status: DC

## 2019-07-01 MED ORDER — STERILE WATER FOR IRRIGATION IR SOLN
Status: DC | PRN
  Administered 2019-07-01: 100 mL

## 2019-07-01 MED ORDER — LIDOCAINE HCL (CARDIAC) PF 100 MG/5ML IV SOSY
PREFILLED_SYRINGE | INTRAVENOUS | Status: DC | PRN
  Administered 2019-07-01: 50 mg via INTRATRACHEAL

## 2019-07-01 MED ORDER — PROPOFOL 500 MG/50ML IV EMUL
INTRAVENOUS | Status: DC | PRN
  Administered 2019-07-01: 125 ug/kg/min via INTRAVENOUS

## 2019-07-01 MED ORDER — PROPOFOL 10 MG/ML IV BOLUS
INTRAVENOUS | Status: DC | PRN
  Administered 2019-07-01: 60 mg via INTRAVENOUS
  Administered 2019-07-01: 40 mg via INTRAVENOUS

## 2019-07-01 MED ORDER — KETAMINE HCL 10 MG/ML IJ SOLN
INTRAMUSCULAR | Status: DC | PRN
  Administered 2019-07-01: 20 mg via INTRAVENOUS

## 2019-07-01 NOTE — Anesthesia Preprocedure Evaluation (Signed)
Anesthesia Evaluation  Patient identified by MRN, date of birth, ID band Patient awake    Reviewed: Allergy & Precautions, H&P , NPO status , Patient's Chart, lab work & pertinent test results, reviewed documented beta blocker date and time   Airway Mallampati: II  TM Distance: >3 FB Neck ROM: full    Dental no notable dental hx.    Pulmonary asthma , sleep apnea ,    Pulmonary exam normal        Cardiovascular hypertension, Normal cardiovascular exam     Neuro/Psych  Headaches,  Neuromuscular disease CVA    GI/Hepatic GERD  Medicated,  Endo/Other  diabetes, Poorly Controlled, Type 2  Renal/GU      Musculoskeletal   Abdominal   Peds  Hematology negative hematology ROS (+)   Anesthesia Other Findings   Reproductive/Obstetrics                             Anesthesia Physical Anesthesia Plan  ASA: III  Anesthesia Plan: General   Post-op Pain Management:    Induction:   PONV Risk Score and Plan: 2 and Propofol infusion  Airway Management Planned:   Additional Equipment:   Intra-op Plan:   Post-operative Plan:   Informed Consent: I have reviewed the patients History and Physical, chart, labs and discussed the procedure including the risks, benefits and alternatives for the proposed anesthesia with the patient or authorized representative who has indicated his/her understanding and acceptance.       Plan Discussed with: CRNA  Anesthesia Plan Comments:         Anesthesia Quick Evaluation

## 2019-07-01 NOTE — Op Note (Signed)
[facility] Patient Name: [name] Procedure Date: 07/01/2019 12:51 PM MRN: [id number] Date of Birth: 12-12-1945 Attending MD: Barney Drain MD, MD CSN: [id number] Age: 74 Admit Type: Outpatient Procedure:                Colonoscopy WITH COLD SNARE/SNARE CAUTERY                            POLYPECTOMY Indications:              Screening for colorectal malignant neoplasm Providers:                Barney Drain MD, MD, Rosina Lowenstein, RN, Aram Candela Referring MD:             Theador Hawthorne. Hawks Medicines:                Propofol per Anesthesia Complications:            No immediate complications. Estimated Blood Loss:     Estimated blood loss was minimal. Procedure:                Pre-Anesthesia Assessment:                           - Prior to the procedure, a History and Physical                            was performed, and patient medications and                            allergies were reviewed. The patient's tolerance of                            previous anesthesia was also reviewed. The risks                            and benefits of the procedure and the sedation                            options and risks were discussed with the patient.                            All questions were answered, and informed consent                            was obtained. Prior Anticoagulants: The patient has                            taken no previous anticoagulant or antiplatelet                            agents. ASA Grade Assessment: II - A patient with                            mild systemic disease. After reviewing the risks  and benefits, the patient was deemed in                            satisfactory condition to undergo the procedure.                            After obtaining informed consent, the colonoscope                            was passed under direct vision. Throughout the                            procedure, the patient's blood pressure,  pulse, and                            oxygen saturations were monitored continuously. The                            [id number]-[id number] scope was introduced through                            the anus and advanced to the the cecum, identified                            by appendiceal orifice and ileocecal valve. The                            colonoscopy was somewhat difficult due to                            restricted mobility of the colon and significant                            looping. Successful completion of the procedure was                            aided by increasing the dose of sedation                            medication, straightening and shortening the scope                            to obtain bowel loop reduction and COLOWRAP. The                            patient tolerated the procedure fairly well. The                            quality of the bowel preparation was good. The                            ileocecal valve, appendiceal orifice, and rectum  were photographed. Scope In: 1:36:12 PM Scope Out: 2:01:10 PM Scope Withdrawal Time: 0 hours 20 minutes 59 seconds  Total Procedure Duration: 0 hours 24 minutes 58 seconds  Findings:      Four sessile polyps were found in the rectum, sigmoid colon, hepatic       flexure and mid ascending colon. The polyps were 3 to 5 mm in size.       These polyps were removed with a cold snare. Resection and retrieval       were complete.      A 8 mm polyp was found in the mid ascending colon. The polyp was       sessile. The polyp was removed with a hot snare. Resection and retrieval       were complete.      Multiple small and large-mouthed diverticula were found in the       recto-sigmoid colon and sigmoid colon.      External and internal hemorrhoids were found.      The recto-sigmoid colon, sigmoid colon and descending colon were       moderately tortuous. Impression:               - Four 3 to 5  mm polyps in the rectum, in the                            sigmoid colon, at the hepatic flexure(BTL1) and in                            the mid ascending colon(BTL1), removed with a cold                            snare. Resected and retrieved.                           - One 8 mm polyp in the mid ascending colon(BTL1),                            removed with a hot snare. Resected and retrieved.                           - MODERATE Diverticulosis in the recto-sigmoid                            colon and in the sigmoid colon.                           - External and internal hemorrhoids.                           - Tortuous colon. Moderate Sedation:      Per Anesthesia Care Recommendation:           - Patient has a contact number available for                            emergencies. The signs and symptoms of potential  delayed complications were discussed with the                            patient. Return to normal activities tomorrow.                            Written discharge instructions were provided to the                            patient.                           - High fiber diet.                           - Continue present medications.                           - Await pathology results.                           - Repeat colonoscopy is not recommended due to                            current age (35 years or older) for surveillance. Procedure Code(s):        --- Professional ---                           [phone], Colonoscopy, flexible; with removal of                            tumor(s), polyp(s), or other lesion(s) by snare                            technique Diagnosis Code(s):        --- Professional ---                           Z12.11, Encounter for screening for malignant                            neoplasm of colon                           K62.1, Rectal polyp                           K63.5, Polyp of colon                            K64.8, Other hemorrhoids                           K57.30, Diverticulosis of large intestine without                            perforation or abscess without bleeding  Q43.8, Other specified congenital malformations of                            intestine CPT copyright 2019 American Medical Association. All rights reserved. The codes documented in this report are preliminary and upon coder review may  be revised to meet current compliance requirements. Barney Drain, MD Barney Drain MD, MD 07/01/2019 2:20:06 PM This report has been signed electronically. Number of Addenda: 0

## 2019-07-01 NOTE — Anesthesia Postprocedure Evaluation (Signed)
Anesthesia Post Note  Patient: [name]  Procedure(s) Performed: COLONOSCOPY WITH PROPOFOL (N/A ) POLYPECTOMY  Patient location during evaluation: Endoscopy Anesthesia Type: General Level of consciousness: awake and alert Pain management: pain level controlled Vital Signs Assessment: post-procedure vital signs reviewed and stable Respiratory status: spontaneous breathing, nonlabored ventilation, respiratory function stable and patient connected to nasal cannula oxygen Cardiovascular status: blood pressure returned to baseline and stable Postop Assessment: no apparent nausea or vomiting Anesthetic complications: no     Last Vitals:  Vitals:   07/01/19 1308 07/01/19 1408  BP: (!) 163/84 (!) 158/74  Pulse: 82 82  Resp: 14 15  Temp: 36.9 C 36.6 C  SpO2: 98% 99%    Last Pain:  Vitals:   07/01/19 1402  TempSrc:   PainSc: 0-No pain                 Talitha Givens

## 2019-07-01 NOTE — H&P (Signed)
Primary Care Physician:  Sharion Balloon, FNP Primary Gastroenterologist:  Dr. Oneida Alar  Pre-Procedure History & Physical: HPI:  [name] is a 74 y.o. female here for [place].  Past Medical History:  Diagnosis Date  . Asthma   . Chronic systolic heart failure ([place])    Echo (08/26/13):  Mild LVH. EF 25% to 30%. Diffuse HK. Aortic valve: There was trivial regurgitation. Mitral valve: There was mild regurgitation. Left atrium: The atrium was mildly dilated.  Marland Kitchen GERD (gastroesophageal reflux disease)   . Headache(784.0)   . History of nephrolithiasis   . Hypertension   . NICM (nonischemic cardiomyopathy) ([place])    a. LHC (08/2013):  no CAD  . Sleep apnea    does not use machine  . Stroke ([place])   . Type II or unspecified type diabetes mellitus without mention of complication, uncontrolled    borderline    Past Surgical History:  Procedure Laterality Date  . ABDOMINAL HYSTERECTOMY    . LEFT AND RIGHT HEART CATHETERIZATION WITH CORONARY ANGIOGRAM N/A 09/03/2013   Procedure: LEFT AND RIGHT HEART CATHETERIZATION WITH CORONARY ANGIOGRAM;  Surgeon: [name], MD;  [facility]: [facility] CATH LAB;  Service: Cardiovascular;  Laterality: N/A;  . OOPHORECTOMY     removed 1 ovary and 1 ovary remains.  . ureteral extraction of kidney stone      Prior to Admission medications   Medication Sig Start Date End Date Taking? Authorizing Provider  amLODipine (NORVASC) 5 MG tablet Take 1 tablet (5 mg total) by mouth daily. 01/22/18  Yes Hawks, Christy A, FNP  atorvastatin (LIPITOR) 20 MG tablet Take 1 tablet (20 mg total) by mouth daily. 02/13/19 02/13/20 Yes Hawks, Christy A, FNP  bisoprolol (ZEBETA) 5 MG tablet Take 1 tablet (5 mg total) by mouth daily. 03/12/19  Yes Minus Breeding, MD  escitalopram (LEXAPRO) 20 MG tablet Take 1 tablet (20 mg total) by mouth daily. 02/07/19  Yes Hawks, Alyse Low A, FNP  glucose blood (ONETOUCH VERIO) test strip Use as instructed 12/24/13  Yes Hawks,  Alyse Low A, FNP  metFORMIN (GLUCOPHAGE-XR) 500 MG 24 hr tablet Take 2 tablets (1,000 mg total) by mouth daily with breakfast. 02/13/19  Yes Hawks, [place] A, FNP  ONETOUCH DELICA LANCETS FINE MISC Use to check BG once daily 01/19/14  Yes Eckard, Tammy, PharmD  polyethylene glycol-electrolytes (NULYTELY/GOLYTELY) 420 g solution As directed 03/26/19  Yes Brantly Kalman L, MD  sacubitril-valsartan (ENTRESTO) 97-103 MG Take 1 tablet by mouth 2 (two) times daily. 09/25/18  Yes Minus Breeding, MD  Vitamin D, Ergocalciferol, (DRISDOL) 50000 units CAPS capsule Take 1 capsule (50,000 Units total) by mouth every 7 (seven) days. 01/31/18  Yes Hawks, Christy A, FNP  albuterol (PROVENTIL HFA;VENTOLIN HFA) 108 (90 Base) MCG/ACT inhaler Inhale 2 puffs into the lungs every 6 (six) hours as needed. 07/05/18   Sharion Balloon, FNP    Allergies as of 03/26/2019 - Review Complete 03/26/2019  Allergen Reaction Noted  . Levaquin [levofloxacin in d5w] Diarrhea 06/08/2014  . Penicillins Rash 07/30/2013    Family History  Problem Relation Age of Onset  . Hypertension Mother   . Arthritis Mother   . CAD Mother 76  . Hip fracture Mother 65  . COPD Father   . Crohn's disease Sister   . Cancer Sister        lung  . Early death Brother 48       house fire  . Diabetes Maternal Grandmother   . Hypertension Sister   .  Hypertension Brother   . Colon cancer Neg Hx     Social History   Socioeconomic History  . Marital status: Divorced    Spouse name: Not on file  . Number of children: 2  . Years of education: Not on file  . Highest education level: Not on file  Occupational History  . 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 Smoking status: Never Smoker  . Smokeless tobacco: Never Used  Substance and Sexual Activity  . Alcohol use: Yes    Comment: rare  . Drug use: No  . Sexual activity: Never    Birth control/protection: Post-menopausal, Surgical  Other Topics  Concern  . Not on file  Social History Narrative   Married "67-'8, divorced; remained single   1 son and 1 daughter   2 grandchildren   Left handed   No caffiene         Social Determinants of Health   Financial Resource Strain:   . Difficulty of Paying Living Expenses:   Food Insecurity:   . Worried About Charity fundraiser in the Last Year:   . Arboriculturist in the Last Year:   Transportation Needs:   . Film/video editor (Medical):   Marland Kitchen Lack of Transportation (Non-Medical):   Physical Activity:   . Days of Exercise per Week:   . Minutes of Exercise per Session:   Stress:   . Feeling of Stress :   Social Connections:   . Frequency of Communication with Friends and Family:   . Frequency of Social Gatherings with Friends and Family:   . Attends Religious Services:   . Active Member of Clubs or Organizations:   . Attends Archivist Meetings:   Marland Kitchen Marital Status:   Intimate Partner Violence:   . Fear of Current or Ex-Partner:   . Emotionally Abused:   Marland Kitchen Physically Abused:   . Sexually Abused:     Review of Systems: See HPI, otherwise negative ROS   Physical Exam: LMP 01/22/1981 (LMP Unknown)  General:   Alert,  pleasant and cooperative in NAD Head:  Normocephalic and atraumatic. Neck:  Supple; Lungs:  Clear throughout to auscultation.    Heart:  Regular rate and rhythm. Abdomen:  Soft, nontender and nondistended. Normal bowel sounds, without guarding, and without rebound.   Neurologic:  Alert and  oriented x4;  grossly normal neurologically.  Impression/Plan:    SCREENING  Plan:  1. TCS TODAY DISCUSSED PROCEDURE, BENEFITS, & RISKS: < 1% chance of medication reaction, bleeding, perforation, ASPIRATION, or rupture of spleen/liver requiring surgery to fix it and missed polyps < 1 cm 10-20% of the time.

## 2019-07-01 NOTE — Transfer of Care (Signed)
Immediate Anesthesia Transfer of Care Note  Patient: [name]  Procedure(s) Performed: COLONOSCOPY WITH PROPOFOL (N/A ) POLYPECTOMY  Patient Location: PACU  Anesthesia Type:General  Level of Consciousness: awake, alert  and oriented  Airway & Oxygen Therapy: Patient Spontanous Breathing  Post-op Assessment: Report given to RN, Post -op Vital signs reviewed and stable and Patient moving all extremities X 4  Post vital signs: Reviewed and stable  Last Vitals:  Vitals Value Taken Time  BP 158/74 07/01/19 1408  Temp    Pulse 78 07/01/19 1411  Resp 15 07/01/19 1411  SpO2 98 % 07/01/19 1411  Vitals shown include unvalidated device data.  Last Pain:  Vitals:   07/01/19 1402  TempSrc:   PainSc: 0-No pain         Complications: No apparent anesthesia complications

## 2019-07-02 LAB — SURGICAL PATHOLOGY

## 2019-07-03 NOTE — Discharge Instructions (Signed)
You have internal and external hemorrhoids and diverticulosis IN YOUR LEFT COLON. YOU HAD FIVE POLYPS REMOVED.    DRINK WATER TO KEEP YOUR URINE LIGHT YELLOW.  FOLLOW A HIGH FIBER DIET. AVOID ITEMS THAT CAUSE BLOATING. See info below.   USE PREPARATION H FOUR TIMES  A DAY IF NEEDED TO RELIEVE RECTAL PAIN/PRESSURE/BLEEDING.  YOUR BIOPSY RESULTS WILL BE BACK IN 5 BUSINESS DAYS.  We do not routinely screen for polyps after the age of 78.  Colonoscopy Care After Read the instructions outlined below and refer to this sheet in the next week. These discharge instructions provide you with general information on caring for yourself after you leave the hospital. While your treatment has been planned according to the most current medical practices available, unavoidable complications occasionally occur. If you have any problems or questions after discharge, call DR. Timmey Lamba, [phone].  ACTIVITY  You may resume your regular activity, but move at a slower pace for the next 24 hours.   Take frequent rest periods for the next 24 hours.   Walking will help get rid of the air and reduce the bloated feeling in your belly (abdomen).   No driving for 24 hours (because of the medicine (anesthesia) used during the test).   You may shower.   Do not sign any important legal documents or operate any machinery for 24 hours (because of the anesthesia used during the test).    NUTRITION  Drink plenty of fluids.   You may resume your normal diet as instructed by your doctor.   Begin with a light meal and progress to your normal diet. Heavy or fried foods are harder to digest and may make you feel sick to your stomach (nauseated).   Avoid alcoholic beverages for 24 hours or as instructed.    MEDICATIONS  You may resume your normal medications.   WHAT YOU CAN EXPECT TODAY  Some feelings of bloating in the abdomen.   Passage of more gas than usual.   Spotting of blood in your stool or on  the toilet paper  .  IF YOU HAD POLYPS REMOVED DURING THE COLONOSCOPY:  Eat a soft diet IF YOU HAVE NAUSEA, BLOATING, ABDOMINAL PAIN, OR VOMITING.    FINDING OUT THE RESULTS OF YOUR TEST Not all test results are available during your visit. DR. Oneida Alar WILL CALL YOU WITHIN 14 DAYS OF YOUR PROCEDUE WITH YOUR RESULTS. Do not assume everything is normal if you have not heard from DR. Tiwana Chavis, CALL HER OFFICE AT [phone].  SEEK IMMEDIATE MEDICAL ATTENTION AND CALL THE OFFICE: [phone] IF:  You have more than a spotting of blood in your stool.   Your belly is swollen (abdominal distention).   You are nauseated or vomiting.   You have a temperature over 101F.   You have abdominal pain or discomfort that is severe or gets worse throughout the day.  High-Fiber Diet A high-fiber diet changes your normal diet to include more whole grains, legumes, fruits, and vegetables. Changes in the diet involve replacing refined carbohydrates with unrefined foods. The calorie level of the diet is essentially unchanged. The Dietary Reference Intake (recommended amount) for adult males is 38 grams per day. For adult females, it is 25 grams per day. Pregnant and lactating women should consume 28 grams of fiber per day. Fiber is the intact part of a plant that is not broken down during digestion. Functional fiber is fiber that has been isolated from the plant to provide a beneficial effect  in the body.  PURPOSE Increase stool bulk.  Ease and regulate bowel movements.  Lower cholesterol.  REDUCE RISK OF COLON CANCER  INDICATIONS THAT YOU NEED MORE FIBER Constipation and hemorrhoids.  Uncomplicated diverticulosis (intestine condition) and irritable bowel syndrome.  Weight management.  As a protective measure against hardening of the arteries (atherosclerosis), diabetes, and cancer.   GUIDELINES FOR INCREASING FIBER IN THE DIET Start adding fiber to the diet slowly. A gradual increase of about 5 more  grams (2 servings of most fruits or vegetables) per day is best. Too rapid an increase in fiber may result in constipation, flatulence, and bloating.  Drink enough water and fluids to keep your urine clear or pale yellow. Water, juice, or caffeine-free drinks are recommended. Not drinking enough fluid may cause constipation.  Eat a variety of high-fiber foods rather than one type of fiber.  Try to increase your intake of fiber through using high-fiber foods rather than fiber pills or supplements that contain small amounts of fiber.  The goal is to change the types of food eaten. Do not supplement your present diet with high-fiber foods, but replace foods in your present diet.    Polyps, Colon  A polyp is extra tissue that grows inside your body. Colon polyps grow in the large intestine. The large intestine, also called the colon, is part of your digestive system. It is a long, hollow tube at the end of your digestive tract where your body makes and stores stool. Most polyps are not dangerous. They are benign. This means they are not cancerous. But over time, some types of polyps can turn into cancer. Polyps that are smaller than a pea are usually not harmful. But larger polyps could someday become or may already be cancerous. To be safe, doctors remove all polyps and test them.   PREVENTION There is not one sure way to prevent polyps. You might be able to lower your risk of getting them if you:  Eat more fruits and vegetables and less fatty food.   Do not smoke.   Avoid alcohol.   Exercise every day.   Lose weight if you are overweight.   Eating more calcium and folate can also lower your risk of getting polyps. Some foods that are rich in calcium are milk, cheese, and broccoli. Some foods that are rich in folate are chickpeas, kidney beans, and spinach.    Diverticulosis Diverticulosis is a common condition that develops when small pouches (diverticula) form in the wall of the colon. The  risk of diverticulosis increases with age. It happens more often in people who eat a low-fiber diet. Most individuals with diverticulosis have no symptoms. Those individuals with symptoms usually experience belly (abdominal) pain, constipation, or loose stools (diarrhea).  HOME CARE INSTRUCTIONS  Increase the amount of fiber in your diet as directed by your caregiver or dietician. This may reduce symptoms of diverticulosis.   Drink at least 6 to 8 glasses of water each day to prevent constipation.   Try not to strain when you have a bowel movement.   Avoiding nuts and seeds to prevent complications is NOT NECESSARY.   FOODS HAVING HIGH FIBER CONTENT INCLUDE:  Fruits. Apple, peach, pear, tangerine, raisins, prunes.   Vegetables. Brussels sprouts, asparagus, broccoli, cabbage, carrot, cauliflower, romaine lettuce, spinach, summer squash, tomato, winter squash, zucchini.   Starchy Vegetables. Baked beans, kidney beans, lima beans, split peas, lentils, potatoes (with skin).    SEEK IMMEDIATE MEDICAL CARE IF:  You develop  increasing pain or severe bloating.   You have an oral temperature above 101F.   You develop vomiting or bowel movements that are bloody or black.

## 2019-07-07 ENCOUNTER — Other Ambulatory Visit: Payer: Self-pay | Admitting: Family

## 2019-07-07 DIAGNOSIS — I10 Essential (primary) hypertension: Secondary | ICD-10-CM

## 2019-07-08 ENCOUNTER — Ambulatory Visit: Payer: Medicare Other | Attending: Internal Medicine

## 2019-07-08 DIAGNOSIS — Z23 Encounter for immunization: Secondary | ICD-10-CM

## 2019-07-08 NOTE — Progress Notes (Signed)
   Cov[facility] Vaccination [facility]  Name:  [name]    MRN: [id number] DOB: [birth year]  07/08/2019  [name] was observed post Covid-19 immunization for 15 minutes without incident. She was provided with Vaccine Information Sheet and instruction to access the V-Safe system.   [name] was instructed to call 911 with any severe reactions post vaccine: Marland Kitchen Difficulty breathing  . Swelling of face and throat  . A fast heartbeat  . A bad rash all over body  . Dizziness and weakness   Immunizations Administered    Name Date Dose VIS Date Route   Pfizer COVID-19 Vaccine 07/08/2019  1:09 PM 0.3 mL 03/21/2019 Intramuscular   Manufacturer: [place]   Lot: [id number]   [place]: [id number]

## 2019-07-15 ENCOUNTER — Other Ambulatory Visit: Payer: Self-pay | Admitting: Family

## 2019-07-15 DIAGNOSIS — I10 Essential (primary) hypertension: Secondary | ICD-10-CM

## 2019-09-17 ENCOUNTER — Ambulatory Visit: Payer: Medicare Other | Admitting: Physician Assistant

## 2019-11-25 ENCOUNTER — Other Ambulatory Visit: Payer: Self-pay | Admitting: Cardiology

## 2019-12-23 ENCOUNTER — Emergency Department (HOSPITAL_COMMUNITY)
Admission: EM | Admit: 2019-12-23 | Discharge: 2019-12-23 | Disposition: A | Discharge status: Home / Self Care | Payer: Medicare Other | Attending: Emergency Medicine | Admitting: Emergency Medicine

## 2019-12-23 ENCOUNTER — Emergency Department (HOSPITAL_COMMUNITY): Payer: Medicare Other

## 2019-12-23 ENCOUNTER — Other Ambulatory Visit: Payer: Self-pay

## 2019-12-23 ENCOUNTER — Encounter (HOSPITAL_COMMUNITY): Payer: Self-pay

## 2019-12-23 DIAGNOSIS — G510 Bell's palsy: Secondary | ICD-10-CM | POA: Diagnosis not present

## 2019-12-23 DIAGNOSIS — S0990XA Unspecified injury of head, initial encounter: Secondary | ICD-10-CM | POA: Diagnosis not present

## 2019-12-23 DIAGNOSIS — E119 Type 2 diabetes mellitus without complications: Secondary | ICD-10-CM | POA: Insufficient documentation

## 2019-12-23 DIAGNOSIS — I429 Cardiomyopathy, unspecified: Secondary | ICD-10-CM | POA: Diagnosis not present

## 2019-12-23 DIAGNOSIS — E781 Pure hyperglyceridemia: Secondary | ICD-10-CM | POA: Insufficient documentation

## 2019-12-23 DIAGNOSIS — Z743 Need for continuous supervision: Secondary | ICD-10-CM | POA: Diagnosis not present

## 2019-12-23 DIAGNOSIS — S199XXA Unspecified injury of neck, initial encounter: Secondary | ICD-10-CM | POA: Diagnosis not present

## 2019-12-23 DIAGNOSIS — Z7984 Long term (current) use of oral hypoglycemic drugs: Secondary | ICD-10-CM | POA: Diagnosis not present

## 2019-12-23 DIAGNOSIS — M8580 Other specified disorders of bone density and structure, unspecified site: Secondary | ICD-10-CM | POA: Insufficient documentation

## 2019-12-23 DIAGNOSIS — Z0389 Encounter for observation for other suspected diseases and conditions ruled out: Secondary | ICD-10-CM | POA: Diagnosis not present

## 2019-12-23 DIAGNOSIS — Z79899 Other long term (current) drug therapy: Secondary | ICD-10-CM | POA: Insufficient documentation

## 2019-12-23 DIAGNOSIS — H9319 Tinnitus, unspecified ear: Secondary | ICD-10-CM | POA: Diagnosis not present

## 2019-12-23 DIAGNOSIS — I110 Hypertensive heart disease with heart failure: Secondary | ICD-10-CM | POA: Diagnosis not present

## 2019-12-23 DIAGNOSIS — R0689 Other abnormalities of breathing: Secondary | ICD-10-CM | POA: Diagnosis not present

## 2019-12-23 DIAGNOSIS — Z23 Encounter for immunization: Secondary | ICD-10-CM | POA: Insufficient documentation

## 2019-12-23 DIAGNOSIS — I4581 Long QT syndrome: Secondary | ICD-10-CM | POA: Insufficient documentation

## 2019-12-23 DIAGNOSIS — S022XXA Fracture of nasal bones, initial encounter for closed fracture: Secondary | ICD-10-CM | POA: Diagnosis not present

## 2019-12-23 DIAGNOSIS — I639 Cerebral infarction, unspecified: Secondary | ICD-10-CM | POA: Diagnosis not present

## 2019-12-23 DIAGNOSIS — I5022 Chronic systolic (congestive) heart failure: Secondary | ICD-10-CM | POA: Diagnosis not present

## 2019-12-23 DIAGNOSIS — Z041 Encounter for examination and observation following transport accident: Secondary | ICD-10-CM | POA: Diagnosis not present

## 2019-12-23 DIAGNOSIS — R6889 Other general symptoms and signs: Secondary | ICD-10-CM | POA: Diagnosis not present

## 2019-12-23 DIAGNOSIS — R519 Headache, unspecified: Secondary | ICD-10-CM | POA: Diagnosis not present

## 2019-12-23 DIAGNOSIS — J3489 Other specified disorders of nose and nasal sinuses: Secondary | ICD-10-CM | POA: Diagnosis not present

## 2019-12-23 DIAGNOSIS — K219 Gastro-esophageal reflux disease without esophagitis: Secondary | ICD-10-CM | POA: Diagnosis not present

## 2019-12-23 DIAGNOSIS — R52 Pain, unspecified: Secondary | ICD-10-CM | POA: Diagnosis not present

## 2019-12-23 DIAGNOSIS — G4489 Other headache syndrome: Secondary | ICD-10-CM | POA: Diagnosis not present

## 2019-12-23 LAB — BASIC METABOLIC PANEL
Anion gap: 11 (ref 5–15)
BUN: 25 mg/dL — ABNORMAL HIGH (ref 8–23)
CO2: 24 mmol/L (ref 22–32)
Calcium: 9.3 mg/dL (ref 8.9–10.3)
Chloride: 108 mmol/L (ref 98–111)
Creatinine, Ser: 0.93 mg/dL (ref 0.44–1.00)
GFR calc Af Amer: >60 mL/min (ref >60)
GFR calc non Af Amer: >60 mL/min (ref >60)
Glucose, Bld: 169 mg/dL — ABNORMAL HIGH (ref 70–99)
Potassium: 3.3 mmol/L — ABNORMAL LOW (ref 3.5–5.1)
Sodium: 143 mmol/L (ref 135–145)

## 2019-12-23 LAB — CBC WITH DIFFERENTIAL/PLATELET
Abs Immature Granulocytes: 0.04 10*3/uL (ref 0.00–0.07)
Basophils Absolute: 0.1 10*3/uL (ref 0.0–0.1)
Basophils Relative: 1 %
Eosinophils Absolute: 0.1 10*3/uL (ref 0.0–0.5)
Eosinophils Relative: 1 %
HCT: 41.4 % (ref 36.0–46.0)
Hemoglobin: 13.7 g/dL (ref 12.0–15.0)
Immature Granulocytes: 1 %
Lymphocytes Relative: 23 %
Lymphs Abs: 1.8 10*3/uL (ref 0.7–4.0)
MCH: 29.8 pg (ref 26.0–34.0)
MCHC: 33.1 g/dL (ref 30.0–36.0)
MCV: 90.2 fL (ref 80.0–100.0)
Monocytes Absolute: 0.5 10*3/uL (ref 0.1–1.0)
Monocytes Relative: 6 %
Neutro Abs: 5.3 10*3/uL (ref 1.7–7.7)
Neutrophils Relative %: 68 %
Platelets: 236 10*3/uL (ref 150–400)
RBC: 4.59 MIL/uL (ref 3.87–5.11)
RDW: 12.8 % (ref 11.5–15.5)
WBC: 7.7 10*3/uL (ref 4.0–10.5)
nRBC: 0 % (ref 0.0–0.2)

## 2019-12-23 MED ORDER — HYDROCODONE-ACETAMINOPHEN 5-325 MG PO TABS: 1.0000 | ORAL_TABLET | Freq: Four times a day (QID) | ORAL | 0 refills | Status: DC | PRN

## 2019-12-23 MED ORDER — TETANUS-DIPHTH-ACELL PERTUSSIS 5-2.5-18.5 LF-MCG/0.5 IM SUSP
0.5000 mL | Freq: Once | INTRAMUSCULAR | Status: AC
  Administered 2019-12-23: 0.5 mL via INTRAMUSCULAR
  Filled 2019-12-23: qty 0.5

## 2019-12-23 MED ORDER — DOXYCYCLINE HYCLATE 100 MG PO CAPS: 100.0000 mg | ORAL_CAPSULE | Freq: Two times a day (BID) | ORAL | 0 refills | Status: DC

## 2019-12-23 MED ORDER — IOHEXOL 300 MG/ML  SOLN
75.0000 mL | Freq: Once | INTRAMUSCULAR | Status: AC | PRN
  Administered 2019-12-23: 75 mL via INTRAVENOUS
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
## 2019-12-23 NOTE — ED Notes (Signed)
edp aware of bp.  Instructed pt to take her medications when she gets home.

## 2020-04-28 ENCOUNTER — Other Ambulatory Visit: Payer: Self-pay | Admitting: Family

## 2020-04-28 DIAGNOSIS — R6889 Other general symptoms and signs: Secondary | ICD-10-CM

## 2020-04-28 NOTE — Telephone Encounter (Signed)
Pt's last OV 02/07/2019, explained that she would need a televisit with one of the providers for a refill and the medicaiton she is requesting for covid. appt made for Friday 04/30/20

## 2020-04-28 NOTE — Telephone Encounter (Signed)
  Prescription Request  04/28/2020  What is the name of the medication or equipment? molmupiravir   Have you contacted your pharmacy to request a refill? (if applicable) yes  Which pharmacy would you like this sent to? [facility] is faxing request   Patient notified that their request is being sent to the clinical staff for review and that they should receive a response within 2 business days.

## 2020-04-29 ENCOUNTER — Emergency Department (HOSPITAL_COMMUNITY): Payer: Medicare HMO

## 2020-04-29 ENCOUNTER — Other Ambulatory Visit: Payer: Self-pay

## 2020-04-29 ENCOUNTER — Emergency Department (HOSPITAL_COMMUNITY)
Admission: EM | Admit: 2020-04-29 | Discharge: 2020-04-29 | Disposition: A | Discharge status: Home / Self Care | Payer: Medicare HMO | Attending: Emergency Medicine | Admitting: Emergency Medicine

## 2020-04-29 ENCOUNTER — Telehealth: Payer: Self-pay | Admitting: Cardiology

## 2020-04-29 DIAGNOSIS — I110 Hypertensive heart disease with heart failure: Secondary | ICD-10-CM | POA: Insufficient documentation

## 2020-04-29 DIAGNOSIS — U071 COVID-19: Secondary | ICD-10-CM | POA: Insufficient documentation

## 2020-04-29 DIAGNOSIS — I428 Other cardiomyopathies: Secondary | ICD-10-CM

## 2020-04-29 DIAGNOSIS — Z7984 Long term (current) use of oral hypoglycemic drugs: Secondary | ICD-10-CM | POA: Diagnosis not present

## 2020-04-29 DIAGNOSIS — E119 Type 2 diabetes mellitus without complications: Secondary | ICD-10-CM | POA: Diagnosis not present

## 2020-04-29 DIAGNOSIS — I5022 Chronic systolic (congestive) heart failure: Secondary | ICD-10-CM | POA: Diagnosis not present

## 2020-04-29 DIAGNOSIS — J45909 Unspecified asthma, uncomplicated: Secondary | ICD-10-CM | POA: Insufficient documentation

## 2020-04-29 DIAGNOSIS — K219 Gastro-esophageal reflux disease without esophagitis: Secondary | ICD-10-CM | POA: Diagnosis not present

## 2020-04-29 DIAGNOSIS — E1165 Type 2 diabetes mellitus with hyperglycemia: Secondary | ICD-10-CM

## 2020-04-29 DIAGNOSIS — R0602 Shortness of breath: Secondary | ICD-10-CM | POA: Diagnosis not present

## 2020-04-29 DIAGNOSIS — Z8673 Personal history of transient ischemic attack (TIA), and cerebral infarction without residual deficits: Secondary | ICD-10-CM | POA: Diagnosis not present

## 2020-04-29 DIAGNOSIS — I639 Cerebral infarction, unspecified: Secondary | ICD-10-CM | POA: Diagnosis not present

## 2020-04-29 DIAGNOSIS — Z79899 Other long term (current) drug therapy: Secondary | ICD-10-CM | POA: Insufficient documentation

## 2020-04-29 DIAGNOSIS — R0789 Other chest pain: Secondary | ICD-10-CM | POA: Diagnosis not present

## 2020-04-29 DIAGNOSIS — I10 Essential (primary) hypertension: Secondary | ICD-10-CM | POA: Diagnosis not present

## 2020-04-29 LAB — COMPREHENSIVE METABOLIC PANEL
ALT: 16 U/L (ref 0–44)
AST: 16 U/L (ref 15–41)
Albumin: 3.5 g/dL (ref 3.5–5.0)
Alkaline Phosphatase: 58 U/L (ref 38–126)
Anion gap: 9 (ref 5–15)
BUN: 25 mg/dL — ABNORMAL HIGH (ref 8–23)
CO2: 26 mmol/L (ref 22–32)
Calcium: 9.4 mg/dL (ref 8.9–10.3)
Chloride: 107 mmol/L (ref 98–111)
Creatinine, Ser: 1.32 mg/dL — ABNORMAL HIGH (ref 0.44–1.00)
GFR, Estimated: 42 mL/min — ABNORMAL LOW (ref >60)
Glucose, Bld: 315 mg/dL — ABNORMAL HIGH (ref 70–99)
Potassium: 3.5 mmol/L (ref 3.5–5.1)
Sodium: 142 mmol/L (ref 135–145)
Total Bilirubin: 0.4 mg/dL (ref 0.3–1.2)
Total Protein: 6.3 g/dL — ABNORMAL LOW (ref 6.5–8.1)

## 2020-04-29 LAB — CBC
HCT: 43.5 % (ref 36.0–46.0)
Hemoglobin: 14.2 g/dL (ref 12.0–15.0)
MCH: 29.3 pg (ref 26.0–34.0)
MCHC: 32.6 g/dL (ref 30.0–36.0)
MCV: 89.7 fL (ref 80.0–100.0)
Platelets: 268 10*3/uL (ref 150–400)
RBC: 4.85 MIL/uL (ref 3.87–5.11)
RDW: 12.8 % (ref 11.5–15.5)
WBC: 7.3 10*3/uL (ref 4.0–10.5)
nRBC: 0 % (ref 0.0–0.2)

## 2020-04-29 MED ORDER — SODIUM CHLORIDE 0.9 % IV BOLUS
500.0000 mL | Freq: Once | INTRAVENOUS | Status: AC
  Administered 2020-04-29: 500 mL via INTRAVENOUS

## 2020-04-29 NOTE — Telephone Encounter (Signed)
Spoke to patient she stated she tested positive for covid this past Tuesday.Stated she feels awful,coughing,sneezing,body aches.She has a virtual appointment with PCP tomorrow.She is worried because she has diabetes and a heart condition.[facility] order placed.Advised she will get a call if she qualifies for infusion.I will make Dr.Hochrein aware.

## 2020-04-29 NOTE — ED Provider Notes (Signed)
[facility] EMERGENCY DEPARTMENT Provider Note   CSN: [id number] Arrival date & time: 04/29/20  1417     History Chief Complaint  Patient presents with  . Shortness of Breath    [name] is a 75 y.o. female.  The history is provided by the patient.  Shortness of Breath Severity:  Mild Onset quality:  Gradual Timing:  Intermittent Progression:  Waxing and waning Chronicity:  New Context: URI (covid postive 3 days ago, with body aches, fatigue)   Relieved by:  Nothing Worsened by:  Nothing Associated symptoms: no abdominal pain, no chest pain, no cough, no ear pain, no fever, no rash, no sore throat and no vomiting        Past Medical History:  Diagnosis Date  . Asthma   . Chronic systolic heart failure ([place])    Echo (08/26/13):  Mild LVH. EF 25% to 30%. Diffuse HK. Aortic valve: There was trivial regurgitation. Mitral valve: There was mild regurgitation. Left atrium: The atrium was mildly dilated.  Marland Kitchen GERD (gastroesophageal reflux disease)   . Headache(784.0)   . History of nephrolithiasis   . Hypertension   . NICM (nonischemic cardiomyopathy) ([place])    a. LHC (08/2013):  no CAD  . Sleep apnea    does not use machine  . Stroke ([place])   . Type II or unspecified type diabetes mellitus without mention of complication, uncontrolled    borderline    Patient Active Problem List   Diagnosis Date Noted  . Dizziness 04/29/2019  . Heartburn 03/26/2019  . Colon cancer screening 03/26/2019  . Prolonged QT interval 03/11/2019  . Educated about COVID-19 virus infection 03/11/2019  . Hypertriglyceridemia 02/07/2019  . Snoring 02/06/2018  . Vitamin D deficiency 07/30/2014  . Osteopenia with high risk of fracture 07/30/2014  . Left-sided Bell's palsy 01/16/2014  . CVA (cerebral infarction) 12/16/2013  . NICM (nonischemic cardiomyopathy), Echo 08/26/13-EF 25-30%  09/17/2013  . Chronic systolic heart failure ([place]) 09/17/2013  . Uncontrolled type 2  diabetes mellitus ([place]) 05/05/2010  . TRANSIENT ISCHEMIC ATTACK 05/05/2010  . Essential hypertension 11/12/2007  . Asthma 11/12/2007  . GERD 11/12/2007  . Headache(784.0) 11/12/2007  . NEPHROLITHIASIS, HX OF 11/12/2007    Past Surgical History:  Procedure Laterality Date  . ABDOMINAL HYSTERECTOMY    . COLONOSCOPY WITH PROPOFOL N/A 07/01/2019   Procedure: COLONOSCOPY WITH PROPOFOL;  Surgeon: Danie Binder, MD;  Location: [facility];  Service: Endoscopy;  Laterality: N/A;  2:15pm - pt will not move up  . LEFT AND RIGHT HEART CATHETERIZATION WITH CORONARY ANGIOGRAM N/A 09/03/2013   Procedure: LEFT AND RIGHT HEART CATHETERIZATION WITH CORONARY ANGIOGRAM;  Surgeon: [name], MD;  Location: [facility] CATH LAB;  Service: Cardiovascular;  Laterality: N/A;  . OOPHORECTOMY     removed 1 ovary and 1 ovary remains.  Marland Kitchen POLYPECTOMY  07/01/2019   Procedure: POLYPECTOMY;  Surgeon: Danie Binder, MD;  Location: [facility] SU[facility];  Service: Endoscopy;;  . ureteral extraction of kidney stone       OB History   No obstetric history on file.     Family History  Problem Relation Age of Onset  . Hypertension Mother   . Arthritis Mother   . CAD Mother 26  . Hip fracture Mother 44  . COPD Father   . Crohn's disease Sister   . Cancer Sister        lung  . Early death Brother 3  house fire  . Diabetes Maternal Grandmother   . Hypertension Sister   . Hypertension Brother   . Colon cancer Neg Hx     Social History   Tobacco Use  . Smoking status: Never Smoker  . Smokeless tobacco: Never Used  Vaping Use  . Vaping Use: Never used  Substance Use Topics  . Alcohol use: Yes    Comment: rare  . Drug use: No    Home Medications Prior to Admission medications   Medication Sig Start Date End Date Taking? Authorizing Provider  albuterol (PROVENTIL HFA;VENTOLIN HFA) 108 (90 Base) MCG/ACT inhaler Inhale 2 puffs into the lungs every 6 (six) hours as needed. 07/05/18   Evelina Dun  A, FNP  amLODipine (NORVASC) 5 MG tablet Take 1 tablet (5 mg total) by mouth daily. (Needs to be seen before next refill) 07/15/19   Sharion Balloon, FNP  atorvastatin (LIPITOR) 20 MG tablet Take 1 tablet (20 mg total) by mouth daily. 02/13/19 02/13/20  Evelina Dun A, FNP  bisoprolol (ZEBETA) 5 MG tablet Take 1 tablet (5 mg total) by mouth daily. 03/12/19   Minus Breeding, MD  doxycycline (VIBRAMYCIN) 100 MG capsule Take 1 capsule (100 mg total) by mouth 2 (two) times daily. One po bid x 7 days 12/23/19   Milton Ferguson, MD  ENTRESTO 97-103 MG Take 1 tablet by mouth twice daily 11/25/19   Minus Breeding, MD  escitalopram (LEXAPRO) 20 MG tablet Take 1 tablet (20 mg total) by mouth daily. 02/07/19   Evelina Dun A, FNP  glucose blood (ONETOUCH VERIO) test strip Use as instructed 12/24/13   Evelina Dun A, FNP  HYDROcodone-acetaminophen (NORCO/VICODIN) 5-325 MG tablet Take 1 tablet by mouth every 6 (six) hours as needed. 12/23/19   Milton Ferguson, MD  metFORMIN (GLUCOPHAGE-XR) 500 MG 24 hr tablet Take 2 tablets (1,000 mg total) by mouth daily with breakfast. 02/13/19   Sharion Balloon, FNP  [facility] DELICA LANCETS FINE MISC Use to check BG once daily 01/19/14   Cherre Robins, PharmD  Vitamin D, Ergocalciferol, (DRISDOL) 50000 units CAPS capsule Take 1 capsule (50,000 Units total) by mouth every 7 (seven) days. 01/31/18   Sharion Balloon, FNP    Allergies    Levaquin [levofloxacin in d5w] and Penicillins  Review of Systems   Review of Systems  Constitutional: Negative for chills and fever.  HENT: Negative for ear pain and sore throat.   Eyes: Negative for pain and visual disturbance.  Respiratory: Positive for shortness of breath. Negative for cough.   Cardiovascular: Negative for chest pain and palpitations.  Gastrointestinal: Negative for abdominal pain and vomiting.  Genitourinary: Negative for dysuria and hematuria.  Musculoskeletal: Positive for myalgias. Negative for arthralgias and  back pain.  Skin: Negative for color change and rash.  Neurological: Negative for seizures and syncope.  All other systems reviewed and are negative.   Physical Exam Updated Vital Signs  ED Triage Vitals [04/29/20 1456]  Enc Vitals Group     BP (!) 202/87     Pulse Rate (!) 112     Resp 18     Temp 98.9 F (37.2 C)     Temp Source Oral     SpO2 99 %     Weight 165 lb (74.8 kg)     Height 5\' 4"  (1.626 m)     Head Circumference      Peak Flow      Pain Score      Pain Loc  Pain Edu?      Excl. in [place]?     Physical Exam Vitals and nursing note reviewed.  Constitutional:      General: She is not in acute distress.    Appearance: She is well-developed and well-nourished.  HENT:     Head: Normocephalic and atraumatic.  Eyes:     Conjunctiva/sclera: Conjunctivae normal.  Cardiovascular:     Rate and Rhythm: Normal rate and regular rhythm.     Pulses: Normal pulses.     Heart sounds: Normal heart sounds. No murmur heard.   Pulmonary:     Effort: Pulmonary effort is normal. No respiratory distress.     Breath sounds: Normal breath sounds. No decreased breath sounds, wheezing or rhonchi.  Abdominal:     Palpations: Abdomen is soft.     Tenderness: There is no abdominal tenderness.  Musculoskeletal:        General: No edema.     Cervical back: Neck supple.  Skin:    General: Skin is warm and dry.     Capillary Refill: Capillary refill takes less than 2 seconds.  Neurological:     General: No focal deficit present.     Mental Status: She is alert.  Psychiatric:        Mood and Affect: Mood and affect normal.     ED Results / Procedures / Treatments   Labs (all labs ordered are listed, but only abnormal results are displayed) Labs Reviewed  COMPREHENSIVE METABOLIC PANEL - Abnormal; Notable for the following components:      Result Value   Glucose, Bld 315 (*)    BUN 25 (*)    Creatinine, Ser 1.32 (*)    Total Protein 6.3 (*)    GFR, Estimated 42 (*)     All other components within normal limits  CBC    EKG EKG Interpretation  Date/Time:  Thursday April 29 2020 15:02:09 EST Ventricular Rate:  94 PR Interval:  172 QRS Duration: 100 QT Interval:  390 QTC Calculation: 487 R Axis:   -93 Text Interpretation: Sinus rhythm with occasional Premature ventricular complexes Right superior axis deviation Incomplete right bundle branch block Anterior infarct , age undetermined Abnormal ECG Confirmed by Lennice Sites [phone]) on 04/29/2020 4:29:21 PM   Radiology DG Chest 2 View  Result Date: 04/29/2020 CLINICAL DATA:  Pt COVID + as of 04/27/20 - pt still having increasing SOB and chest tightness - hx of asthma, GERD, diabetes, htn, NICM, stroke, sleep apneasob EXAM: CHEST - 2 VIEW COMPARISON:  None. FINDINGS: Normal mediastinum and cardiac silhouette. Normal pulmonary vasculature. No evidence of effusion, infiltrate, or pneumothorax. No acute bony abnormality. IMPRESSION: No acute cardiopulmonary process. Electronically Signed   By: Suzy Bouchard M.D.   On: 04/29/2020 15:52    Procedures Procedures (including critical care time)  Medications Ordered in ED Medications  sodium chloride 0.9 % bolus 500 mL (500 mLs Intravenous New Bag/Given 04/29/20 1747)    ED Course  I have reviewed the triage vital signs and the nursing notes.  Pertinent labs & imaging results that were available during my care of the patient were reviewed by me and considered in my medical decision making (see chart for details).    MDM Rules/Calculators/A&P                          [name] is a 75 year old female with history of hypertension, heart failure, asthma who presents the ED  with body aches, fatigue, COVID.  Overall unremarkable vitals.  Blood pressure high upon arrival but on recheck is 150/90.  Heart rate in the 80s.  No fever.  Normal room air oxygenation both at rest and with ambulation.  No tachypnea.  Normal work of breathing.  Clear breath  sounds.  Overall appears mildly dehydrated on exam.  Has not had much to eat or drink for the last 3 days.  Is vaccinated fully against COVID.  Lab work showed a mild bump in her creatinine to 1.3 and blood sugar 315 but otherwise unremarkable labs.  Chest x-ray with no pneumonia or signs of inflammation or COVID.  Overall suspect some mild dehydration and will give some gentle hydration with a 500 cc bolus given her history of heart failure.  She appears to have already been arranged for possible outpatient infusion treatments but overall she appears stable for discharge.  Felt better after IV fluids.  Referred her to [facility] outpatient clinic.  Patient understands return precautions.  This chart was dictated using voice recognition software.  Despite best efforts to proofread,  errors can occur which can change the documentation meaning.  [name] was evaluated in Emergency Department on 04/29/2020 for the symptoms described in the history of present illness. She was evaluated in the context of the global COVID-19 pandemic, which necessitated consideration that the patient might be at risk for infection with the SARS-CoV-2 virus that causes COVID-19. Institutional protocols and algorithms that pertain to the evaluation of patients at risk for COVID-19 are in a state of rapid change based on information released by regulatory bodies including the CDC and federal and state organizations. These policies and algorithms were followed during the patient's care in the ED.   Final Clinical Impression(s) / ED Diagnoses Final diagnoses:  [id number]    Rx / DC Orders ED Discharge Orders    None       Lennice Sites, DO 04/29/20 1827

## 2020-04-29 NOTE — Telephone Encounter (Signed)
Pt called in and stated she has tested positive for Covid and would like to know if Dr Percival Spanish has any suggest of meds she needs to take.  She is coughing Sneezing , back hurts and tired. She has VT with pcp tomorrow.     Best number [phone]

## 2020-04-30 ENCOUNTER — Ambulatory Visit (INDEPENDENT_AMBULATORY_CARE_PROVIDER_SITE_OTHER): Payer: Medicare HMO | Admitting: Nurse Practitioner

## 2020-04-30 ENCOUNTER — Encounter: Payer: Self-pay | Admitting: Nurse Practitioner

## 2020-04-30 DIAGNOSIS — U071 COVID-19: Secondary | ICD-10-CM

## 2020-04-30 MED ORDER — PREDNISONE 20 MG PO TABS: 40.0000 mg | ORAL_TABLET | Freq: Every day | ORAL | 0 refills | Status: AC

## 2020-04-30 MED ORDER — ALBUTEROL SULFATE HFA 108 (90 BASE) MCG/ACT IN AERS: 2.0000 | INHALATION_SPRAY | Freq: Four times a day (QID) | RESPIRATORY_TRACT | 0 refills | Status: DC | PRN

## 2020-04-30 MED ORDER — BENZONATATE 100 MG PO CAPS: 100.0000 mg | ORAL_CAPSULE | Freq: Three times a day (TID) | ORAL | 0 refills | Status: DC | PRN

## 2020-04-30 MED ORDER — MOLNUPIRAVIR 200 MG PO CAPS: 800.0000 mg | ORAL_CAPSULE | Freq: Two times a day (BID) | ORAL | 0 refills | Status: DC

## 2020-04-30 NOTE — Progress Notes (Signed)
Virtual Visit via telephone Note Due to COVID-19 pandemic this visit was conducted virtually. This visit type was conducted due to national recommendations for restrictions regarding the COVID-19 Pandemic (e.g. social distancing, sheltering in place) in an effort to limit this patient's exposure and mitigate transmission in our community. All issues noted in this document were discussed and addressed.  A physical exam was not performed with this format.  I connected with Christella Noa on 04/30/20 at 9:30 by telephone and verified that I am speaking with the correct person using two identifiers. KEYLAH DARWISH is currently located at home and no one is currently with her during visit. The provider, Mary-Margaret Hassell Done, FNP is located in their office at time of visit.  I discussed the limitations, risks, security and privacy concerns of performing an evaluation and management service by telephone and the availability of in person appointments. I also discussed with the patient that there may be a patient responsible charge related to this service. The patient expressed understanding and agreed to proceed.   History and Present Illness:   Chief Complaint: Covid Positive   HPI Patient tested positive for Covid at [facility] on Tuesday. Her symptoms started Sunday night. She is still coughing and SOB. She went to the ER yesterday and they told her she did not have pneumonia. Her heart and lungs were good. She still feels really bad. She is requesting a prescription for albuterol and melnupiravir ( the new Antiviral ). Her current symptoms include, SOB, hoarse, cough and general malaise.    Review of Systems  Constitutional: Negative for diaphoresis and weight loss.  Eyes: Negative for blurred vision, double vision and pain.  Respiratory: Negative for shortness of breath.   Cardiovascular: Negative for chest pain, palpitations, orthopnea and leg swelling.  Gastrointestinal: Negative  for abdominal pain.  Skin: Negative for rash.  Neurological: Negative for dizziness, sensory change, loss of consciousness, weakness and headaches.  Endo/Heme/Allergies: Negative for polydipsia. Does not bruise/bleed easily.  Psychiatric/Behavioral: Negative for memory loss. The patient does not have insomnia.   All other systems reviewed and are negative.    Observations/Objective: Alert and oriented- answers all questions appropriately No distress Voice hoarse Dry cough noted   Assessment and Plan: BERNARDETTE WALDRON in today with chief complaint of Covid Positive   1. Lab test positive for detection of COVID-19 virus Force fluids Rest ER note reviewed Meds ordered this encounter  Medications   Molnupiravir 200 MG CAPS    Sig: Take 4 capsules (800 mg total) by mouth in the morning and at bedtime.    Dispense:  40 capsule    Refill:  0    Order Specific Question:   Supervising Provider    Answer:   Caryl Pina A [[id number]]   predniSONE (DELTASONE) 20 MG tablet    Sig: Take 2 tablets (40 mg total) by mouth daily with breakfast for 5 days. 2 po daily for 5 days    Dispense:  10 tablet    Refill:  0    Order Specific Question:   Supervising Provider    Answer:   Caryl Pina A [[id number]]   benzonatate (TESSALON PERLES) 100 MG capsule    Sig: Take 1 capsule (100 mg total) by mouth 3 (three) times daily as needed.    Dispense:  20 capsule    Refill:  0    Order Specific Question:   Supervising Provider    Answer:   Caryl Pina A [id number]  albuterol (VENTOLIN HFA) 108 (90 Base) MCG/ACT inhaler    Sig: Inhale 2 puffs into the lungs every 6 (six) hours as needed for wheezing or shortness of breath.    Dispense:  18 g    Refill:  0    Order Specific Question:   Supervising Provider    Answer:   Caryl Pina A [id number]    * know that may not meet criteria for antiviral- patient understands   Follow Up Instructions: prn    I discussed the  assessment and treatment plan with the patient. The patient was provided an opportunity to ask questions and all were answered. The patient agreed with the plan and demonstrated an understanding of the instructions.   The patient was advised to call back or seek an in-person evaluation if the symptoms worsen or if the condition fails to improve as anticipated.  The above assessment and management plan was discussed with the patient. The patient verbalized understanding of and has agreed to the management plan. Patient is aware to call the clinic if symptoms persist or worsen. Patient is aware when to return to the clinic for a follow-up visit. Patient educated on when it is appropriate to go to the emergency department.   Time call ended:  9:46  I provided 16 minutes of non-face-to-face time during this encounter.    Mary-Margaret Hassell Done, FNP

## 2020-04-30 NOTE — Telephone Encounter (Signed)
She is talking to her PCP.  We should check to see if she gets the instructions below.  Thanks  If you get low O2 sats you would call [phone] to see if you qualify for any of the [facility] Health protocols for IV or oral therapy.  Otherwise  Zinc 100 mg daily Vit C 500 - 1000 mg 2 x daily Vit D3 5000 IU daily ASA 325 mg daily Selenium 200 mcg daily Quercetin 250 mg twice daily  Long acting melatonin at bedtime (5 - 10 mg)  Gargle with mouthwash or use nasal spray 2x daily.    Choices are :  Scope/ACT/Crest or Listerine with essential oils  Or 1% peroxide solution (mix for each use):  1 table spoon of 3% peroxide solution with 2 tablespoons of distilled water.  Squirt in the nose to the back of the nose 1cc and spit out.  Gargle with the rest  Or 1% povidone/iodine solution:  1% betadine solution in syringe.  6oz distilled water, 2 teaspoons iodone 10% solution at least 1 cc in the nose to back of throat and spit out, gargle with remaining.

## 2020-04-30 NOTE — Telephone Encounter (Signed)
Left message for patient to call back  

## 2020-05-06 ENCOUNTER — Telehealth: Payer: Self-pay | Admitting: *Deleted

## 2020-05-06 NOTE — Chronic Care Management (AMB) (Signed)
  Chronic Care Management   Outreach Note  05/06/2020 Name: [name] MRN: [id number] DOB: 28-Sep-1945  [name] is a 75 y.o. year old female who is a primary care patient of Sharion Balloon, FNP. I reached out to [name] by phone today in response to a referral sent by [name]'s health plan.     An unsuccessful telephone outreach was attempted today. The patient was referred to the case management team for assistance with care management and care coordination.   Follow Up Plan: A HIPAA compliant phone message was left for the patient providing contact information and requesting a return call. The care management team will reach out to the patient again over the next 7 days. If patient returns call to provider office, please advise to call [place] at [phone].  [place] Management

## 2020-05-10 ENCOUNTER — Ambulatory Visit: Payer: Medicare HMO

## 2020-05-10 ENCOUNTER — Encounter (HOSPITAL_BASED_OUTPATIENT_CLINIC_OR_DEPARTMENT_OTHER): Payer: Self-pay | Admitting: *Deleted

## 2020-05-10 ENCOUNTER — Other Ambulatory Visit: Payer: Self-pay

## 2020-05-10 ENCOUNTER — Emergency Department (HOSPITAL_BASED_OUTPATIENT_CLINIC_OR_DEPARTMENT_OTHER)
Admission: EM | Admit: 2020-05-10 | Discharge: 2020-05-11 | Disposition: A | Discharge status: Home / Self Care | Payer: Medicare HMO | Attending: Emergency Medicine | Admitting: Emergency Medicine

## 2020-05-10 ENCOUNTER — Encounter: Payer: Self-pay | Admitting: Family Medicine

## 2020-05-10 ENCOUNTER — Ambulatory Visit (INDEPENDENT_AMBULATORY_CARE_PROVIDER_SITE_OTHER): Payer: Medicare HMO | Admitting: Family Medicine

## 2020-05-10 ENCOUNTER — Emergency Department (HOSPITAL_BASED_OUTPATIENT_CLINIC_OR_DEPARTMENT_OTHER): Payer: Medicare HMO

## 2020-05-10 VITALS — BP 188/95 | HR 76 | Temp 98.2°F

## 2020-05-10 DIAGNOSIS — Z79899 Other long term (current) drug therapy: Secondary | ICD-10-CM | POA: Diagnosis not present

## 2020-05-10 DIAGNOSIS — I110 Hypertensive heart disease with heart failure: Secondary | ICD-10-CM | POA: Insufficient documentation

## 2020-05-10 DIAGNOSIS — Z7984 Long term (current) use of oral hypoglycemic drugs: Secondary | ICD-10-CM | POA: Insufficient documentation

## 2020-05-10 DIAGNOSIS — I5022 Chronic systolic (congestive) heart failure: Secondary | ICD-10-CM | POA: Insufficient documentation

## 2020-05-10 DIAGNOSIS — U071 COVID-19: Secondary | ICD-10-CM | POA: Insufficient documentation

## 2020-05-10 DIAGNOSIS — R079 Chest pain, unspecified: Secondary | ICD-10-CM

## 2020-05-10 DIAGNOSIS — U099 Post covid-19 condition, unspecified: Secondary | ICD-10-CM

## 2020-05-10 DIAGNOSIS — E119 Type 2 diabetes mellitus without complications: Secondary | ICD-10-CM | POA: Insufficient documentation

## 2020-05-10 DIAGNOSIS — K449 Diaphragmatic hernia without obstruction or gangrene: Secondary | ICD-10-CM | POA: Diagnosis not present

## 2020-05-10 DIAGNOSIS — J45909 Unspecified asthma, uncomplicated: Secondary | ICD-10-CM | POA: Diagnosis not present

## 2020-05-10 DIAGNOSIS — B948 Sequelae of other specified infectious and parasitic diseases: Secondary | ICD-10-CM

## 2020-05-10 DIAGNOSIS — R0602 Shortness of breath: Secondary | ICD-10-CM | POA: Diagnosis not present

## 2020-05-10 LAB — COMPREHENSIVE METABOLIC PANEL
ALT: 17 U/L (ref 0–44)
AST: 17 U/L (ref 15–41)
Albumin: 3.8 g/dL (ref 3.5–5.0)
Alkaline Phosphatase: 55 U/L (ref 38–126)
Anion gap: 10 (ref 5–15)
BUN: 25 mg/dL — ABNORMAL HIGH (ref 8–23)
CO2: 24 mmol/L (ref 22–32)
Calcium: 9.3 mg/dL (ref 8.9–10.3)
Chloride: 108 mmol/L (ref 98–111)
Creatinine, Ser: 0.95 mg/dL (ref 0.44–1.00)
GFR, Estimated: >60 mL/min (ref >60)
Glucose, Bld: 202 mg/dL — ABNORMAL HIGH (ref 70–99)
Potassium: 3.5 mmol/L (ref 3.5–5.1)
Sodium: 142 mmol/L (ref 135–145)
Total Bilirubin: 0.5 mg/dL (ref 0.3–1.2)
Total Protein: 6.5 g/dL (ref 6.5–8.1)

## 2020-05-10 LAB — CBC WITH DIFFERENTIAL/PLATELET
Abs Immature Granulocytes: 0.06 10*3/uL (ref 0.00–0.07)
Basophils Absolute: 0.1 10*3/uL (ref 0.0–0.1)
Basophils Relative: 0 %
Eosinophils Absolute: 0.1 10*3/uL (ref 0.0–0.5)
Eosinophils Relative: 1 %
HCT: 41.4 % (ref 36.0–46.0)
Hemoglobin: 13.9 g/dL (ref 12.0–15.0)
Immature Granulocytes: 1 %
Lymphocytes Relative: 27 %
Lymphs Abs: 3 10*3/uL (ref 0.7–4.0)
MCH: 29.9 pg (ref 26.0–34.0)
MCHC: 33.6 g/dL (ref 30.0–36.0)
MCV: 89 fL (ref 80.0–100.0)
Monocytes Absolute: 0.8 10*3/uL (ref 0.1–1.0)
Monocytes Relative: 7 %
Neutro Abs: 7.3 10*3/uL (ref 1.7–7.7)
Neutrophils Relative %: 64 %
Platelets: 239 10*3/uL (ref 150–400)
RBC: 4.65 MIL/uL (ref 3.87–5.11)
RDW: 13.2 % (ref 11.5–15.5)
WBC: 11.4 10*3/uL — ABNORMAL HIGH (ref 4.0–10.5)
nRBC: 0 % (ref 0.0–0.2)

## 2020-05-10 LAB — D-DIMER, QUANTITATIVE: D-Dimer, Quant: 1 ug/mL-FEU — ABNORMAL HIGH (ref 0.00–0.50)

## 2020-05-10 LAB — TROPONIN I (HIGH SENSITIVITY): Troponin I (High Sensitivity): 7 ng/L (ref <18)

## 2020-05-10 MED ORDER — ALBUTEROL SULFATE HFA 108 (90 BASE) MCG/ACT IN AERS: 2.0000 | INHALATION_SPRAY | RESPIRATORY_TRACT | Status: DC | PRN

## 2020-05-10 NOTE — ED Notes (Signed)
Pt amb to triage with pulse ox 97% HR 89

## 2020-05-10 NOTE — Progress Notes (Signed)
Established Patient Office Visit  Subjective:  Patient ID: [name], female    DOB: 1945/08/13  Age: 75 y.o. MRN: [id number]  CC:  Chief Complaint  Patient presents with  . Covid Positive    SOB, weakness,     HPI [name] presents for shortness of breath and weakness. She feels like she has no energy. She tested positive for Covid on 04/29/20. She feels like her cough got better but otherwise she feels like she doesn't feel any better. She is having some shortness of breath with exertion and talking, this has gotten worse. She does not have shortness of breath at baseline. She does have some dizziness and blurred vision for the last few days. She reports some chest pain right in the center on her chest that comes and goes. The pain is sharp. It seems to occur at random. It lasts for a few seconds and goes away on it's own. She denies nausea or vomiting. She did take molnupiravir and a steroid burst. She finished both of these on Monday. She has been taking tessalon perles for cough, this has helped. She has some chest congestion. Her cough is dry. She has had a stroke in the past, about 5 years ago. She has not been checking her BP at home.   Past Medical History:  Diagnosis Date  . Asthma   . Chronic systolic heart failure ([place])    Echo (08/26/13):  Mild LVH. EF 25% to 30%. Diffuse HK. Aortic valve: There was trivial regurgitation. Mitral valve: There was mild regurgitation. Left atrium: The atrium was mildly dilated.  Marland Kitchen GERD (gastroesophageal reflux disease)   . Headache(784.0)   . History of nephrolithiasis   . Hypertension   . NICM (nonischemic cardiomyopathy) ([place])    a. LHC (08/2013):  no CAD  . Sleep apnea    does not use machine  . Stroke ([place])   . Type II or unspecified type diabetes mellitus without mention of complication, uncontrolled    borderline    Past Surgical History:  Procedure Laterality Date  . ABDOMINAL HYSTERECTOMY    . COLONOSCOPY WITH  PROPOFOL N/A 07/01/2019   Procedure: COLONOSCOPY WITH PROPOFOL;  Surgeon: Danie Binder, MD;  [facility];  Service: Endoscopy;  Laterality: N/A;  2:15pm - pt will not move up  . LEFT AND RIGHT HEART CATHETERIZATION WITH CORONARY ANGIOGRAM N/A 09/03/2013   Procedure: LEFT AND RIGHT HEART CATHETERIZATION WITH CORONARY ANGIOGRAM;  Surgeon: [name], MD;  Location: [facility] CATH LAB;  Service: Cardiovascular;  Laterality: N/A;  . OOPHORECTOMY     removed 1 ovary and 1 ovary remains.  Marland Kitchen POLYPECTOMY  07/01/2019   Procedure: POLYPECTOMY;  Surgeon: Danie Binder, MD;  [facility]: [facility];  Service: Endoscopy;;  . ureteral extraction of kidney stone      Family History  Problem Relation Age of Onset  . Hypertension Mother   . Arthritis Mother   . CAD Mother 72  . Hip fracture Mother 37  . COPD Father   . Crohn's disease Sister   . Cancer Sister        lung  . Early death Brother 60       house fire  . Diabetes Maternal Grandmother   . Hypertension Sister   . Hypertension Brother   . Colon cancer Neg Hx     Social History   Socioeconomic History  . Marital status: Divorced    Spouse name: Not  on file  . Number of children: 2  . Years of education: Not on file  . Highest education level: Not on file  Occupational History  . 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 Smoking status: Never Smoker  . Smokeless tobacco: Never Used  Vaping Use  . Vaping Use: Never used  Substance and Sexual Activity  . Alcohol use: Yes    Comment: rare  . Drug use: No  . Sexual activity: Never    Birth control/protection: Post-menopausal, Surgical  Other Topics Concern  . Not on file  Social History Narrative   Married "67-'8, divorced; remained single   1 son and 1 daughter   2 grandchildren   Left handed   No caffiene         Social Determinants of Health   Financial Resource Strain: Not on file  Food Insecurity: Not on  file  Transportation Needs: Not on file  Physical Activity: Not on file  Stress: Not on file  Social Connections: Not on file  Intimate Partner Violence: Not on file    Outpatient Medications Prior to Visit  Medication Sig Dispense Refill  . albuterol (VENTOLIN HFA) 108 (90 Base) MCG/ACT inhaler Inhale 2 puffs into the lungs every 6 (six) hours as needed for wheezing or shortness of breath. 18 g 0  . amLODipine (NORVASC) 5 MG tablet Take 1 tablet (5 mg total) by mouth daily. (Needs to be seen before next refill) 30 tablet 0  . atorvastatin (LIPITOR) 20 MG tablet Take 1 tablet (20 mg total) by mouth daily. 90 tablet 3  . benzonatate (TESSALON PERLES) 100 MG capsule Take 1 capsule (100 mg total) by mouth 3 (three) times daily as needed. 20 capsule 0  . bisoprolol (ZEBETA) 5 MG tablet Take 1 tablet (5 mg total) by mouth daily. 90 tablet 3  . ENTRESTO 97-103 MG Take 1 tablet by mouth twice daily 60 tablet 4  . glucose blood (ONETOUCH VERIO) test strip Use as instructed 100 each 12  . HYDROcodone-acetaminophen (NORCO/VICODIN) 5-325 MG tablet Take 1 tablet by mouth every 6 (six) hours as needed. 12 tablet 0  . metFORMIN (GLUCOPHAGE-XR) 500 MG 24 hr tablet Take 2 tablets (1,000 mg total) by mouth daily with breakfast. 180 tablet 0  . ONETOUCH DELICA LANCETS FINE MISC Use to check BG once daily 100 each 2  . Vitamin D, Ergocalciferol, (DRISDOL) 50000 units CAPS capsule Take 1 capsule (50,000 Units total) by mouth every 7 (seven) days. 12 capsule 4  . escitalopram (LEXAPRO) 20 MG tablet Take 1 tablet (20 mg total) by mouth daily. (Patient not taking: Reported on 05/10/2020) 30 tablet 1  . Molnupiravir 200 MG CAPS Take 4 capsules (800 mg total) by mouth in the morning and at bedtime. 40 capsule 0   No facility-administered medications prior to visit.    Allergies  Allergen Reactions  . Levaquin [Levofloxacin In D5w] Diarrhea  . Penicillins Rash    ROS Review of Systems As per HPI.     Objective:    Physical Exam Vitals and nursing note reviewed.  Constitutional:      Appearance: She is not toxic-appearing or diaphoretic.  HENT:     Head: Normocephalic and atraumatic.     Nose: Congestion present.     Mouth/Throat:     Mouth: Mucous membranes are moist.     Pharynx: Oropharynx is clear.  Eyes:     Conjunctiva/sclera: Conjunctivae normal.  Pupils: Pupils are equal, round, and reactive to light.  Cardiovascular:     Rate and Rhythm: Normal rate and regular rhythm.     Heart sounds: Normal heart sounds. No murmur heard.   Pulmonary:     Breath sounds: Examination of the right-lower field reveals decreased breath sounds. Examination of the left-lower field reveals decreased breath sounds. Decreased breath sounds present. No wheezing, rhonchi or rales.     Comments: Shortness of breath noted while patient is talking.  Chest:     Chest wall: No tenderness.  Musculoskeletal:     Cervical back: Neck supple.  Skin:    General: Skin is warm and dry.  Neurological:     General: No focal deficit present.     Mental Status: She is alert and oriented to person, place, and time.  Psychiatric:        Mood and Affect: Mood normal.        Behavior: Behavior normal. Behavior is cooperative.     BP (!) 188/95   Pulse 76   Temp 98.2 F (36.8 C) (Temporal)   LMP 01/22/1981 (LMP Unknown)   SpO2 96%  Wt Readings from Last 3 Encounters:  04/29/20 165 lb (74.8 kg)  12/23/19 173 lb (78.5 kg)  06/27/19 173 lb (78.5 kg)     Health Maintenance Due  Topic Date Due  . OPHTHALMOLOGY EXAM  07/03/2015  . HEMOGLOBIN A1C  08/08/2019  . INFLUENZA VACCINE  11/09/2019  . DEXA SCAN  01/25/2020  . FOOT EXAM  02/07/2020  . URINE MICROALBUMIN  02/07/2020    There are no preventive care reminders to display for this patient.  Lab Results  Component Value Date   TSH 2.170 02/07/2019   Lab Results  Component Value Date   WBC 7.3 04/29/2020   HGB 14.2 04/29/2020   HCT  43.5 04/29/2020   MCV 89.7 04/29/2020   PLT 268 04/29/2020   Lab Results  Component Value Date   NA 142 04/29/2020   K 3.5 04/29/2020   CO2 26 04/29/2020   GLUCOSE 315 (H) 04/29/2020   BUN 25 (H) 04/29/2020   CREATININE 1.32 (H) 04/29/2020   BILITOT 0.4 04/29/2020   ALKPHOS 58 04/29/2020   AST 16 04/29/2020   ALT 16 04/29/2020   PROT 6.3 (L) 04/29/2020   ALBUMIN 3.5 04/29/2020   CALCIUM 9.4 04/29/2020   ANIONGAP 9 04/29/2020   GFR 54.05 (L) 10/07/2013   Lab Results  Component Value Date   CHOL 203 (H) 02/07/2019   Lab Results  Component Value Date   HDL 40 02/07/2019   Lab Results  Component Value Date   LDLCALC 129 (H) 02/07/2019   Lab Results  Component Value Date   TRIG 193 (H) 02/07/2019   Lab Results  Component Value Date   CHOLHDL 5.1 (H) 02/07/2019   Lab Results  Component Value Date   HGBA1C 8.0 (H) 02/07/2019      Assessment & Plan:   [place] was seen today for covid positive.  Diagnoses and all orders for this visit:  COVID-19/Shortness of breath/Chest pain, unspecified type Positive on 04/29/20, patient reports worsening shortness of breath and chest pain. BP is elevated in office at 190/86, also reports burred vision and dizziness. Discussed concerns for pneumonia, PE, stroke, and MI with chest pain, shortness and breath, and Covid infection and the need for imaging and further testing to rule these out. Patient instructed to go to ED. She agree and will have a family  member drive her.    Follow-up: Patient instructed to go to ED.  The patient indicates understanding of these issues and agrees with the plan.   Gwenlyn Perking, FNP

## 2020-05-10 NOTE — ED Provider Notes (Signed)
[facility] DEPT [facility] Provider Note: Georgena Spurling, MD, FACEP  CSN: [id number] MRN: [id number] ARRIVAL: 05/10/20 at [place]: [place] Positive and Shortness of Breath   HISTORY OF PRESENT ILLNESS  05/10/20 11:13 PM [name] is a 75 y.o. female who developed Covid symptoms about 3 weeks ago.  Specifically she had body aches, sore throat, cough, stuffy nose, shortness of breath and pain in her chest.  She was placed on an unspecified antiviral course by her PCP which she completed a week ago.  Despite treatment and time she continues to have shortness of breath, worse with exertion.  She also has lower substernal chest pain with cough or deep breathing.  Symptoms are moderate (she rates her pain as an 8 out of 10 at its worst).  She was not noted to be hypoxic on arrival.  She was seen at her PCPs office today and they sent her to the ED for further evaluation.  She states they were concerned about her blood pressure being elevated.  It is been as low as 170/89 here and as high as 199/94 presently.  She has been using her albuterol inhaler with some relief.   Past Medical History:  Diagnosis Date  . Asthma   . Chronic systolic heart failure ([place])    Echo (08/26/13):  Mild LVH. EF 25% to 30%. Diffuse HK. Aortic valve: There was trivial regurgitation. Mitral valve: There was mild regurgitation. Left atrium: The atrium was mildly dilated.  Marland Kitchen GERD (gastroesophageal reflux disease)   . Headache(784.0)   . History of nephrolithiasis   . Hypertension   . NICM (nonischemic cardiomyopathy) ([place])    a. LHC (08/2013):  no CAD  . Sleep apnea    does not use machine  . Stroke ([place])   . Type II or unspecified type diabetes mellitus without mention of complication, uncontrolled    borderline    Past Surgical History:  Procedure Laterality Date  . ABDOMINAL HYSTERECTOMY    . COLONOSCOPY WITH PROPOFOL N/A 07/01/2019   Procedure: COLONOSCOPY WITH PROPOFOL;   Surgeon: Danie Binder, MD;  [facility];  Service: Endoscopy;  Laterality: N/A;  2:15pm - pt will not move up  . LEFT AND RIGHT HEART CATHETERIZATION WITH CORONARY ANGIOGRAM N/A 09/03/2013   Procedure: LEFT AND RIGHT HEART CATHETERIZATION WITH CORONARY ANGIOGRAM;  Surgeon: [name], MD;  Location: [facility] CATH LAB;  Service: Cardiovascular;  Laterality: N/A;  . OOPHORECTOMY     removed 1 ovary and 1 ovary remains.  Marland Kitchen POLYPECTOMY  07/01/2019   Procedure: POLYPECTOMY;  Surgeon: Danie Binder, MD;  [facility];  Service: Endoscopy;;  . ureteral extraction of kidney stone      Family History  Problem Relation Age of Onset  . Hypertension Mother   . Arthritis Mother   . CAD Mother 74  . Hip fracture Mother 58  . COPD Father   . Crohn's disease Sister   . Cancer Sister        lung  . Early death Brother 80       house fire  . Diabetes Maternal Grandmother   . Hypertension Sister   . Hypertension Brother   . Colon cancer Neg Hx     Social History   Tobacco Use  . Smoking status: Never Smoker  . Smokeless tobacco: Never Used  Vaping Use  . Vaping Use: Never used  Substance Use Topics  . Alcohol  use: Yes    Comment: rare  . Drug use: No    Prior to Admission medications   Medication Sig Start Date End Date Taking? Authorizing Provider  albuterol (VENTOLIN HFA) 108 (90 Base) MCG/ACT inhaler Inhale 2 puffs into the lungs every 6 (six) hours as needed for wheezing or shortness of breath. 04/30/20   Hassell Done, Mary-Margaret, FNP  amLODipine (NORVASC) 5 MG tablet Take 1 tablet (5 mg total) by mouth daily. (Needs to be seen before next refill) 07/15/19   Sharion Balloon, FNP  atorvastatin (LIPITOR) 20 MG tablet Take 1 tablet (20 mg total) by mouth daily. 02/13/19 02/13/20  Sharion Balloon, FNP  benzonatate (TESSALON PERLES) 100 MG capsule Take 1 capsule (100 mg total) by mouth 3 (three) times daily as needed. 04/30/20   Hassell Done, Mary-Margaret, FNP   bisoprolol (ZEBETA) 5 MG tablet Take 1 tablet (5 mg total) by mouth daily. 03/12/19   Minus Breeding, MD  ENTRESTO 97-103 MG Take 1 tablet by mouth twice daily 11/25/19   Minus Breeding, MD  glucose blood [facility] VERIO) test strip Use as instructed 12/24/13   Evelina Dun A, FNP  HYDROcodone-acetaminophen (NORCO/VICODIN) 5-325 MG tablet Take 1 tablet by mouth every 6 (six) hours as needed. 12/23/19   Milton Ferguson, MD  metFORMIN (GLUCOPHAGE-XR) 500 MG 24 hr tablet Take 2 tablets (1,000 mg total) by mouth daily with breakfast. 02/13/19   Sharion Balloon, FNP  [facility] DELICA LANCETS FINE MISC Use to check BG once daily 01/19/14   Cherre Robins, PharmD  Vitamin D, Ergocalciferol, (DRISDOL) 50000 units CAPS capsule Take 1 capsule (50,000 Units total) by mouth every 7 (seven) days. 01/31/18   Evelina Dun A, FNP  escitalopram (LEXAPRO) 20 MG tablet Take 1 tablet (20 mg total) by mouth daily. Patient not taking: Reported on 05/10/2020 02/07/19 05/11/20  Sharion Balloon, FNP    Allergies Levaquin [levofloxacin in d5w] and Penicillins   REVIEW OF SYSTEMS  Negative except as noted here or in the History of Present Illness.   PHYSICAL EXAMINATION  Initial Vital Signs Blood pressure (!) 197/105, pulse 67, temperature 98.2 F (36.8 C), resp. rate 18, height 5\' 4"  (1.626 m), weight 75.3 kg, last menstrual period 01/22/1981, SpO2 98 %.  Examination General: Well-developed, well-nourished female in no acute distress; appearance consistent with age of record HENT: normocephalic; atraumatic Eyes: pupils equal, round and reactive to light; extraocular muscles intact Neck: supple Heart: regular rate and rhythm; occasional PVCs Lungs: Mildly decreased air movement bilateral Abdomen: soft; nondistended; nontender; bowel sounds present Extremities: No deformity; full range of motion; pulses normal Neurologic: Awake, alert and oriented; motor function intact in all extremities and symmetric; no  facial droop Skin: Warm and dry Psychiatric: Normal mood and affect   RESULTS  Summary of this visit's results, reviewed and interpreted by myself:   EKG Interpretation  Date/Time:  Monday May 10 2020 18:41:57 EST Ventricular Rate:  70 PR Interval:  164 QRS Duration: 104 QT Interval:  424 QTC Calculation: 457 R Axis:   -84 Text Interpretation: Sinus rhythm with occasional Premature ventricular complexes Left axis deviation Incomplete right bundle branch block Anterior infarct , age undetermined Abnormal ECG No significant change was found Confirmed by Yassin Scales, Jenny Reichmann [phone]) on 05/10/2020 11:14:55 PM      Laboratory Studies: Results for orders placed or performed during the hospital encounter of 05/10/20 (from the past 24 hour(s))  CBC with Differential     Status: Abnormal   Collection Time: 05/10/20  6:51 PM  Result Value Ref Range   WBC 11.4 (H) 4.0 - 10.5 K/uL   RBC 4.65 3.87 - 5.11 MIL/uL   Hemoglobin 13.9 12.0 - 15.0 g/dL   HCT 41.4 36.0 - 46.0 %   MCV 89.0 80.0 - 100.0 fL   MCH 29.9 26.0 - 34.0 pg   MCHC 33.6 30.0 - 36.0 g/dL   RDW 13.2 11.5 - 15.5 %   Platelets 239 150 - 400 K/uL   nRBC 0.0 0.0 - 0.2 %   Neutrophils Relative % 64 %   Neutro Abs 7.3 1.7 - 7.7 K/uL   Lymphocytes Relative 27 %   Lymphs Abs 3.0 0.7 - 4.0 K/uL   Monocytes Relative 7 %   Monocytes Absolute 0.8 0.1 - 1.0 K/uL   Eosinophils Relative 1 %   Eosinophils Absolute 0.1 0.0 - 0.5 K/uL   Basophils Relative 0 %   Basophils Absolute 0.1 0.0 - 0.1 K/uL   Immature Granulocytes 1 %   Abs Immature Granulocytes 0.06 0.00 - 0.07 K/uL  Comprehensive metabolic panel     Status: Abnormal   Collection Time: 05/10/20  6:51 PM  Result Value Ref Range   Sodium 142 135 - 145 mmol/L   Potassium 3.5 3.5 - 5.1 mmol/L   Chloride 108 98 - 111 mmol/L   CO2 24 22 - 32 mmol/L   Glucose, Bld 202 (H) 70 - 99 mg/dL   BUN 25 (H) 8 - 23 mg/dL   Creatinine, Ser 0.95 0.44 - 1.00 mg/dL   Calcium 9.3 8.9 - 10.3  mg/dL   Total Protein 6.5 6.5 - 8.1 g/dL   Albumin 3.8 3.5 - 5.0 g/dL   AST 17 15 - 41 U/L   ALT 17 0 - 44 U/L   Alkaline Phosphatase 55 38 - 126 U/L   Total Bilirubin 0.5 0.3 - 1.2 mg/dL   GFR, Estimated >60 >60 mL/min   Anion gap 10 5 - 15  Troponin I (High Sensitivity)     Status: None   Collection Time: 05/10/20  6:51 PM  Result Value Ref Range   Troponin I (High Sensitivity) 7 <18 ng/L  D-dimer, quantitative (not at [facility])     Status: Abnormal   Collection Time: 05/10/20 11:39 PM  Result Value Ref Range   D-Dimer, Quant 1.00 (H) 0.00 - 0.50 ug/mL-FEU   Imaging Studies: CT Angio Chest PE W and/or Wo Contrast  Result Date: 05/11/2020 CLINICAL DATA:  Chest pain and shortness of breath for several days EXAM: CT ANGIOGRAPHY CHEST WITH CONTRAST TECHNIQUE: Multidetector CT imaging of the chest was performed using the standard protocol during bolus administration of intravenous contrast. Multiplanar CT image reconstructions and MIPs were obtained to evaluate the vascular anatomy. CONTRAST:  126mL OMNIPAQUE IOHEXOL 350 MG/ML SOLN COMPARISON:  Chest x-ray from the previous day, CT from 12/23/2019 FINDINGS: Cardiovascular: Thoracic aorta and its branches demonstrate atherosclerotic calcifications. No aneurysmal dilatation or dissection is noted. No cardiac enlargement is seen. The pulmonary artery shows a normal branching pattern. No filling defects are identified to suggest pulmonary emboli. Mild coronary calcifications are noted. Mediastinum/Nodes: Thoracic inlet is within normal limits. No sizable hilar or mediastinal adenopathy is noted. The esophagus is within normal limits. Small sliding-type hiatal hernia is seen. Lungs/Pleura: Lungs are well aerated bilaterally. Mild scarring is noted in the right lung base stable from the prior study. No focal infiltrate or sizable effusion is seen. No pneumothorax is noted. Upper Abdomen: Visualized upper abdomen is otherwise  within normal limits.  Musculoskeletal: Degenerative changes of the thoracic spine are seen. No acute rib abnormality is noted. Review of the MIP images confirms the above findings. IMPRESSION: No evidence of pulmonary emboli. No acute abnormality is seen. Aortic Atherosclerosis (ICD10-I70.0). Electronically Signed   By: Inez Catalina M.D.   On: 05/11/2020 00:35   DG Chest Portable 1 View  Result Date: 05/10/2020 CLINICAL DATA:  COVID-19 positivity with shortness of breath EXAM: PORTABLE CHEST 1 VIEW COMPARISON:  04/29/2020 FINDINGS: Cardiac shadow is stable. Aortic calcifications are again seen. The lungs are well aerated bilaterally. No focal infiltrate or effusion is seen. No bony abnormality is noted. IMPRESSION: No acute abnormality noted. Electronically Signed   By: Inez Catalina M.D.   On: 05/10/2020 19:28    ED COURSE and MDM  Nursing notes, initial and subsequent vitals signs, including pulse oximetry, reviewed and interpreted by myself.  Vitals:   05/10/20 2138 05/10/20 2200 05/10/20 2300 05/11/20 0000  BP: (!) 197/105 (!) 181/89 (!) 199/94 (!) 213/87  Pulse: 67 62 61 61  Resp: 18 18 16 18   Temp:      SpO2: 98% 98% 100% 97%  Weight:      Height:       Medications  iohexol (OMNIPAQUE) 350 MG/ML injection 100 mL (100 mLs Intravenous Contrast Given 05/11/20 0019)   Patient advised of reassuring CT scan and lab studies.  I suspect this represents "long COVID".  She was advised to continue using her albuterol inhaler and continue taking her vitamin supplements.  There is no specific treatment available at this time.  She is outside the window for any further antiviral treatments and I do not believe steroids would be beneficial for her.   PROCEDURES  Procedures   ED DIAGNOSES     ICD-10-CM   1. Persistent shortness of breath after COVID-19  R06.00    B94.8        Eathel Pajak, Jenny Reichmann, MD 05/11/20 [phone]

## 2020-05-10 NOTE — ED Triage Notes (Signed)
covid + 1/20 , seen BY PMD today and sent here for  eval CP and SOB x 4 days

## 2020-05-11 ENCOUNTER — Emergency Department (HOSPITAL_BASED_OUTPATIENT_CLINIC_OR_DEPARTMENT_OTHER): Payer: Medicare HMO

## 2020-05-11 DIAGNOSIS — R0602 Shortness of breath: Secondary | ICD-10-CM | POA: Diagnosis not present

## 2020-05-11 DIAGNOSIS — K449 Diaphragmatic hernia without obstruction or gangrene: Secondary | ICD-10-CM | POA: Diagnosis not present

## 2020-05-11 DIAGNOSIS — R079 Chest pain, unspecified: Secondary | ICD-10-CM | POA: Diagnosis not present

## 2020-05-11 MED ORDER — IOHEXOL 350 MG/ML SOLN
100.0000 mL | Freq: Once | INTRAVENOUS | Status: AC | PRN
  Administered 2020-05-11: 100 mL via INTRAVENOUS

## 2020-05-11 NOTE — Chronic Care Management (AMB) (Signed)
  Chronic Care Management   Outreach Note  05/11/2020 Name: [name] MRN: [id number] DOB: Jan 28, 1946  [name] is a 75 y.o. year old female who is a primary care patient of Sharion Balloon, FNP. I reached out to [name] by phone today in response to a referral sent by Ms. Randall An Schamp's health plan.     A second unsuccessful telephone outreach was attempted today. The patient was referred to the case management team for assistance with care management and care coordination.   Follow Up Plan: A HIPAA compliant phone message was left for the patient providing contact information and requesting a return call. The care management team will reach out to the patient again over the next 7 days. If patient returns call to provider office, please advise to call [facility] at [phone].  [facility]

## 2020-05-19 NOTE — Chronic Care Management (AMB) (Signed)
  Chronic Care Management   Outreach Note  05/19/2020 Name: [name] MRN: [id number] DOB: 02-Aug-1945  [name] is a 75 y.o. year old female who is a primary care patient of Sharion Balloon, FNP. I reached out to [name] by phone today in response to a referral sent by [name]'s health plan.     Third unsuccessful telephone outreach was attempted today. The patient was referred to the case management team for assistance with care management and care coordination. The patient's primary care provider has been notified of our unsuccessful attempts to make or maintain contact with the patient. The care management team is pleased to engage with this patient at any time in the future should he/she be interested in assistance from the care management team.   Follow Up Plan: If patient returns call to provider office, please advise to call [facility] at [phone].  [place] Management  Direct Dial: [phone]

## 2020-06-04 ENCOUNTER — Other Ambulatory Visit: Payer: Self-pay | Admitting: Family

## 2020-07-06 NOTE — Progress Notes (Signed)
Cardiology Office Note   Date:  07/07/2020   ID:  Felicia, Newton 22-Jul-1945, MRN [id number]  PCP:  Sharion Balloon, FNP  Cardiologist:   No primary care provider on file.  Chief Complaint  Patient presents with  . Cardiomyopathy      History of Present Illness: CADY Newton is a 75 y.o. female who presents  for ongoing assessment and management of hypertension, chronic dyspnea, nonischemic cardiomyopathy, with probable OSA.  She was seen 03/12/2019 she had complaints of shortness of breath and was hypertensive.  An echocardiogram was scheduled to evaluate for LV function.  Bisoprolol was decreased to 5 mg in the setting of some mild dizziness.  He also referred her to ENT concerning some complaints of dizziness as she had had cerumen impaction in the past.  Echocardiogram completed on 03/19/2019 revealed a normal LVEF of 60% to 65%.  Grade 1 diastolic dysfunction.  There was no evidence of pulmonary hypertension.  She had no valvular abnormalities.  Since I last saw her she has done well.  She has had none of the dizziness she was having.  She thinks her breathing is good.  She is not describing any PND or orthopnea.  She is not having any palpitations, presyncope or syncope.  She is stressed because she has to find a new place to rent.   Past Medical History:  Diagnosis Date  . Asthma   . Chronic systolic heart failure ([place])    Echo (08/26/13):  Mild LVH. EF 25% to 30%. Diffuse HK. Aortic valve: There was trivial regurgitation. Mitral valve: There was mild regurgitation. Left atrium: The atrium was mildly dilated.  Marland Kitchen GERD (gastroesophageal reflux disease)   . Headache(784.0)   . History of nephrolithiasis   . Hypertension   . NICM (nonischemic cardiomyopathy) ([place])    a. LHC (08/2013):  no CAD  . Sleep apnea    does not use machine  . Stroke ([place])   . Type II or unspecified type diabetes mellitus without mention of complication, uncontrolled    borderline    Past  Surgical History:  Procedure Laterality Date  . ABDOMINAL HYSTERECTOMY    . COLONOSCOPY WITH PROPOFOL N/A 07/01/2019   Procedure: COLONOSCOPY WITH PROPOFOL;  Surgeon: Danie Binder, MD;  Location: [facility];  Service: Endoscopy;  Laterality: N/A;  2:15pm - pt will not move up  . LEFT AND RIGHT HEART CATHETERIZATION WITH CORONARY ANGIOGRAM N/A 09/03/2013   Procedure: LEFT AND RIGHT HEART CATHETERIZATION WITH CORONARY ANGIOGRAM;  Surgeon: [name], MD;  Location: [facility] CATH LAB;  Service: Cardiovascular;  Laterality: N/A;  . OOPHORECTOMY     removed 1 ovary and 1 ovary remains.  Marland Kitchen POLYPECTOMY  07/01/2019   Procedure: POLYPECTOMY;  Surgeon: Danie Binder, MD;  Location: [facility];  Service: Endoscopy;;  . ureteral extraction of kidney stone       Current Outpatient Medications  Medication Sig Dispense Refill  . albuterol (VENTOLIN HFA) 108 (90 Base) MCG/ACT inhaler Inhale 2 puffs into the lungs every 6 (six) hours as needed for wheezing or shortness of breath. 18 g 0  . atorvastatin (LIPITOR) 20 MG tablet Take 1 tablet (20 mg total) by mouth daily. 90 tablet 3  . bisoprolol (ZEBETA) 5 MG tablet Take 1 tablet (5 mg total) by mouth daily. 90 tablet 3  . ENTRESTO 97-103 MG Take 1 tablet by mouth twice daily 60 tablet 4  . metFORMIN (GLUCOPHAGE-XR) 500 MG  24 hr tablet Take 2 tablets (1,000 mg total) by mouth daily with breakfast. 180 tablet 0  . VITAMIN D, ERGOCALCIFEROL, PO Take by mouth daily.    Marland Kitchen amLODipine (NORVASC) 5 MG tablet Take 1 tablet (5 mg total) by mouth daily. 90 tablet 3  . glucose blood (ONETOUCH VERIO) test strip Use as instructed 100 each 12  . ONETOUCH DELICA LANCETS FINE MISC Use to check BG once daily 100 each 2   No current facility-administered medications for this visit.    Allergies:   Levaquin [levofloxacin in d5w] and Penicillins    ROS:  Please see the history of present illness.   Otherwise, review of systems are positive for none.   All  other systems are reviewed and negative.    PHYSICAL EXAM: VS:  BP (!) 162/94   Pulse (!) 52   Ht 5\' 4"  (1.626 m)   Wt 165 lb (74.8 kg)   LMP 01/22/1981 (LMP Unknown)   BMI 28.32 kg/m  , BMI Body mass index is 28.32 kg/m. GENERAL:  Well appearing NECK:  No jugular venous distention, waveform within normal limits, carotid upstroke brisk and symmetric, no bruits, no thyromegaly LUNGS:  Clear to auscultation bilaterally CHEST:  Unremarkable HEART:  PMI not displaced or sustained,S1 and S2 within normal limits, no S3, no S4, no clicks, no rubs, soft brief right upper sternal border systolic murmur, no diastolic murmurs ABD:  Flat, positive bowel sounds normal in frequency in pitch, no bruits, no rebound, no guarding, no midline pulsatile mass, no hepatomegaly, no splenomegaly EXT:  2 plus pulses throughout, no edema, no cyanosis no clubbing   EKG:  EKG is not ordered today.    Recent Labs: 05/10/2020: ALT 17; BUN 25; Creatinine, Ser 0.95; Hemoglobin 13.9; Platelets 239; Potassium 3.5; Sodium 142    Lipid Panel    Component Value Date/Time   CHOL 203 (H) 02/07/2019 0939   TRIG 193 (H) 02/07/2019 0939   HDL 40 02/07/2019 0939   CHOLHDL 5.1 (H) 02/07/2019 0939   CHOLHDL 4.6 12/17/2013 0353   VLDL 69 (H) 12/17/2013 0353   LDLCALC 129 (H) 02/07/2019 0939      Wt Readings from Last 3 Encounters:  07/07/20 165 lb (74.8 kg)  05/10/20 166 lb (75.3 kg)  04/29/20 165 lb (74.8 kg)      Other studies Reviewed: Additional studies/ records that were reviewed today include: None Review of the above records demonstrates:  Please see elsewhere in the note.     ASSESSMENT AND PLAN:  Hypertension:    Her blood pressure is elevated but she did not take her Norvasc today as she was out.  She is going to start that back along with the other medications to keep a blood pressure diary.  If her pressures are elevated I will start spironolactone.   Nonischemic cardiomyopathy:   Her ejection  fraction most recently was back to 60 to 65% from a low of 30%.   I would not suspect this to be reduced.  She seems to be euvolemic.  No further imaging.     Current medicines are reviewed at length with the patient today.  The patient does not have concerns regarding medicines.  The following changes have been made:  none  Labs/ tests ordered today include: none No orders of the defined types were placed in this encounter.    Disposition:   FU with me in 12 months.     Signed, Minus Breeding, MD  07/07/2020  7:49 PM    [facility]

## 2020-07-07 ENCOUNTER — Other Ambulatory Visit: Payer: Self-pay

## 2020-07-07 ENCOUNTER — Encounter: Payer: Self-pay | Admitting: Cardiology

## 2020-07-07 ENCOUNTER — Ambulatory Visit: Payer: Medicare HMO | Admitting: Cardiology

## 2020-07-07 VITALS — BP 162/94 | HR 52 | Ht 64.0 in | Wt 165.0 lb

## 2020-07-07 DIAGNOSIS — I10 Essential (primary) hypertension: Secondary | ICD-10-CM

## 2020-07-07 DIAGNOSIS — I428 Other cardiomyopathies: Secondary | ICD-10-CM | POA: Diagnosis not present

## 2020-07-07 MED ORDER — AMLODIPINE BESYLATE 5 MG PO TABS: 5.0000 mg | ORAL_TABLET | Freq: Every day | ORAL | 3 refills | Status: DC

## 2020-07-07 NOTE — Patient Instructions (Signed)
Medication Instructions:  The current medical regimen is effective;  continue present plan and medications.  *If you need a refill on your cardiac medications before your next appointment, please call your pharmacy*  Follow-Up: At [facility], you and your health needs are our priority.  As part of our continuing mission to provide you with exceptional heart care, we have created designated Provider Care Teams.  These Care Teams include your primary Cardiologist (physician) and Advanced Practice Providers (APPs -  Physician Assistants and Nurse Practitioners) who all work together to provide you with the care you need, when you need it.  We recommend signing up for the patient portal called "MyChart".  Sign up information is provided on this After Visit Summary.  MyChart is used to connect with patients for Virtual Visits (Telemedicine).  Patients are able to view lab/test results, encounter notes, upcoming appointments, etc.  Non-urgent messages can be sent to your provider as well.   To learn more about what you can do with MyChart, go to [email or web address].    Your next appointment:   12 month(s)  The format for your next appointment:   In Person  Provider:   James Hochrein, MD   Thank you for choosing [facility]!!     

## 2020-07-08 ENCOUNTER — Other Ambulatory Visit: Payer: Self-pay | Admitting: Cardiology

## 2020-07-09 ENCOUNTER — Ambulatory Visit (INDEPENDENT_AMBULATORY_CARE_PROVIDER_SITE_OTHER): Payer: Medicare HMO | Admitting: Family Medicine

## 2020-07-09 ENCOUNTER — Encounter: Payer: Self-pay | Admitting: Family Medicine

## 2020-07-09 DIAGNOSIS — M545 Low back pain, unspecified: Secondary | ICD-10-CM

## 2020-07-09 MED ORDER — PREDNISONE 10 MG (21) PO TBPK: ORAL_TABLET | ORAL | 0 refills | Status: DC

## 2020-07-09 NOTE — Progress Notes (Signed)
Virtual Visit via Telephone Note  I connected with [name] on 07/09/20 at 3:47 PM by telephone and verified that I am speaking with the correct person using two identifiers. [name] is currently located at home and nobody is currently with her during this visit. The provider, Loman Brooklyn, FNP is located in their office at time of visit.  I discussed the limitations, risks, security and privacy concerns of performing an evaluation and management service by telephone and the availability of in person appointments. I also discussed with the patient that there may be a patient responsible charge related to this service. The patient expressed understanding and agreed to proceed.  Subjective: PCP: Sharion Balloon, FNP  Chief Complaint  Patient presents with  . Back Pain   Patient reports left-sided low back pain that started several days ago.  She has been picking up and moving things around getting ready to move to a new place.  Pain is worse with getting up and out of bed.  She describes it as excruciating if she turns a certain way.  She has been taking leftover Norco from a previous surgery for the pain.   ROS: Per HPI  Current Outpatient Medications:  .  albuterol (VENTOLIN HFA) 108 (90 Base) MCG/ACT inhaler, Inhale 2 puffs into the lungs every 6 (six) hours as needed for wheezing or shortness of breath., Disp: 18 g, Rfl: 0 .  amLODipine (NORVASC) 5 MG tablet, Take 1 tablet (5 mg total) by mouth daily., Disp: 90 tablet, Rfl: 3 .  atorvastatin (LIPITOR) 20 MG tablet, Take 1 tablet (20 mg total) by mouth daily., Disp: 90 tablet, Rfl: 3 .  bisoprolol (ZEBETA) 5 MG tablet, Take 1 tablet by mouth once daily, Disp: 90 tablet, Rfl: 0 .  ENTRESTO 97-103 MG, Take 1 tablet by mouth twice daily, Disp: 60 tablet, Rfl: 4 .  glucose blood (ONETOUCH VERIO) test strip, Use as instructed, Disp: 100 each, Rfl: 12 .  metFORMIN (GLUCOPHAGE-XR) 500 MG 24 hr tablet, Take 2 tablets (1,000  mg total) by mouth daily with breakfast., Disp: 180 tablet, Rfl: 0 .  ONETOUCH DELICA LANCETS FINE MISC, Use to check BG once daily, Disp: 100 each, Rfl: 2 .  VITAMIN D, ERGOCALCIFEROL, PO, Take by mouth daily., Disp: , Rfl:   Allergies  Allergen Reactions  . Levaquin [Levofloxacin In D5w] Diarrhea  . Penicillins Rash   Past Medical History:  Diagnosis Date  . Asthma   . Chronic systolic heart failure ([place])    Echo (08/26/13):  Mild LVH. EF 25% to 30%. Diffuse HK. Aortic valve: There was trivial regurgitation. Mitral valve: There was mild regurgitation. Left atrium: The atrium was mildly dilated.  Marland Kitchen GERD (gastroesophageal reflux disease)   . Headache(784.0)   . History of nephrolithiasis   . Hypertension   . NICM (nonischemic cardiomyopathy) ([place])    a. LHC (08/2013):  no CAD  . Sleep apnea    does not use machine  . Stroke ([place])   . Type II or unspecified type diabetes mellitus without mention of complication, uncontrolled    borderline    Observations/Objective: A&O  No respiratory distress or wheezing audible over the phone Mood, judgement, and thought processes all WNL   Assessment and Plan: 1. Acute left-sided low back pain without sciatica Heating pad.  Muscle rub.  Prednisone taper. - predniSONE (STERAPRED UNI-PAK 21 TAB) 10 MG (21) TBPK tablet; As directed x 6 days  Dispense: 21 tablet; Refill:  0   Follow Up Instructions:  I discussed the assessment and treatment plan with the patient. The patient was provided an opportunity to ask questions and all were answered. The patient agreed with the plan and demonstrated an understanding of the instructions.   The patient was advised to call back or seek an in-person evaluation if the symptoms worsen or if the condition fails to improve as anticipated.  The above assessment and management plan was discussed with the patient. The patient verbalized understanding of and has agreed to the management plan. Patient is aware to  call the clinic if symptoms persist or worsen. Patient is aware when to return to the clinic for a follow-up visit. Patient educated on when it is appropriate to go to the emergency department.   Time call ended: 3:54 PM  I provided 7 minutes of non-face-to-face time during this encounter.  Hendricks Limes, MSN, APRN, FNP-C [facility] 07/09/20

## 2020-08-26 ENCOUNTER — Encounter: Payer: Self-pay | Admitting: Family Medicine

## 2020-08-26 ENCOUNTER — Other Ambulatory Visit: Payer: Self-pay

## 2020-08-26 ENCOUNTER — Ambulatory Visit (INDEPENDENT_AMBULATORY_CARE_PROVIDER_SITE_OTHER): Payer: Medicare HMO

## 2020-08-26 ENCOUNTER — Ambulatory Visit (INDEPENDENT_AMBULATORY_CARE_PROVIDER_SITE_OTHER): Payer: Medicare HMO | Admitting: Family Medicine

## 2020-08-26 VITALS — BP 189/78 | HR 75 | Temp 97.8°F | Ht 64.0 in | Wt 161.8 lb

## 2020-08-26 DIAGNOSIS — M25561 Pain in right knee: Secondary | ICD-10-CM

## 2020-08-26 DIAGNOSIS — I10 Essential (primary) hypertension: Secondary | ICD-10-CM

## 2020-08-26 DIAGNOSIS — F419 Anxiety disorder, unspecified: Secondary | ICD-10-CM | POA: Diagnosis not present

## 2020-08-26 DIAGNOSIS — F331 Major depressive disorder, recurrent, moderate: Secondary | ICD-10-CM | POA: Diagnosis not present

## 2020-08-26 DIAGNOSIS — R69 Illness, unspecified: Secondary | ICD-10-CM | POA: Diagnosis not present

## 2020-08-26 MED ORDER — AMLODIPINE BESYLATE 10 MG PO TABS: 10.0000 mg | ORAL_TABLET | Freq: Every day | ORAL | 2 refills | Status: DC

## 2020-08-26 MED ORDER — DICLOFENAC SODIUM 1 % EX GEL: 2.0000 g | Freq: Four times a day (QID) | CUTANEOUS | 1 refills | Status: DC

## 2020-08-26 NOTE — Progress Notes (Signed)
Assessment & Plan:  1. Acute pain of right knee Uncontrolled. Knee injection today. Tylenol 1,000 mg TID PRN. Voltaren gel. Advised against use of Ibuprofen.  - DG Knee 1-2 Views Right - diclofenac Sodium (VOLTAREN) 1 % GEL; Apply 2 g topically 4 (four) times daily.  Dispense: 150 g; Refill: 1 - Joint Injection/Arthrocentesis  2. Essential hypertension Uncontrolled.  Amlodipine increased from 5 mg to 10 mg once daily. Advised to follow-up with PCP.  - amLODipine (NORVASC) 10 MG tablet; Take 1 tablet (10 mg total) by mouth daily.  Dispense: 30 tablet; Refill: 2  3. Recurrent moderate major depressive disorder with anxiety (HCC) Uncontrolled. Advised to follow-up with PCP.    Follow up plan: Return in about 6 weeks (around 10/07/2020) for follow-up of chronic medication conditions with PCP.  Hendricks Limes, MSN, APRN, FNP-C [facility]  Subjective:   Patient ID: [name], female    DOB: 1945/11/21, 75 y.o.   MRN: [id number]  HPI: [name] is a 75 y.o. female presenting on 08/26/2020 for Knee Pain (Patient states she has been having right knee pain for a few weeks. )  Patient reports right knee pain x1 week.  She has been moving and has to be out of the house by tomorrow.  Pain is so bad it is waking her up from sleep.  She has been taking Tylenol 500 mg every 3 hours and ibuprofen 400 mg every 4 hours.  Blood pressure is elevated today and has been for some time.  Depression screen [facility] 2/9 08/26/2020 02/07/2019 02/07/2019  Decreased Interest 0 3 0  Down, Depressed, Hopeless 2 1 0  PHQ - 2 Score 2 4 0  Altered sleeping 3 3 -  Tired, decreased energy 3 3 -  Change in appetite 3 0 -  Feeling bad or failure about yourself  3 3 -  Trouble concentrating 0 0 -  Moving slowly or fidgety/restless 0 1 -  Suicidal thoughts 0 0 -  PHQ-9 Score 14 14 -  Difficult doing work/chores Somewhat difficult Somewhat difficult -  Some recent data might be  hidden   GAD 7 : Generalized Anxiety Score 08/26/2020  Nervous, Anxious, on Edge 3  Control/stop worrying 3  Worry too much - different things 3  Trouble relaxing 3  Restless 0  Easily annoyed or irritable 2  Afraid - awful might happen 2  Total GAD 7 Score 16  Anxiety Difficulty Somewhat difficult    ROS: Negative unless specifically indicated above in HPI.   Relevant past medical history reviewed and updated as indicated.   Allergies and medications reviewed and updated.   Current Outpatient Medications:  .  albuterol (VENTOLIN HFA) 108 (90 Base) MCG/ACT inhaler, Inhale 2 puffs into the lungs every 6 (six) hours as needed for wheezing or shortness of breath., Disp: 18 g, Rfl: 0 .  amLODipine (NORVASC) 5 MG tablet, Take 1 tablet (5 mg total) by mouth daily., Disp: 90 tablet, Rfl: 3 .  bisoprolol (ZEBETA) 5 MG tablet, Take 1 tablet by mouth once daily, Disp: 90 tablet, Rfl: 0 .  ENTRESTO 97-103 MG, Take 1 tablet by mouth twice daily, Disp: 60 tablet, Rfl: 4 .  glucose blood (ONETOUCH VERIO) test strip, Use as instructed, Disp: 100 each, Rfl: 12 .  metFORMIN (GLUCOPHAGE-XR) 500 MG 24 hr tablet, Take 2 tablets (1,000 mg total) by mouth daily with breakfast., Disp: 180 tablet, Rfl: 0 .  ONETOUCH DELICA LANCETS FINE MISC, Use  to check BG once daily, Disp: 100 each, Rfl: 2 .  VITAMIN D, ERGOCALCIFEROL, PO, Take by mouth daily., Disp: , Rfl:  .  atorvastatin (LIPITOR) 20 MG tablet, Take 1 tablet (20 mg total) by mouth daily., Disp: 90 tablet, Rfl: 3  Allergies  Allergen Reactions  . Levaquin [Levofloxacin In D5w] Diarrhea  . Penicillins Rash    Objective:   BP (!) 189/78   Pulse 75   Temp 97.8 F (36.6 C) (Temporal)   Ht 5\' 4"  (1.626 m)   Wt 161 lb 12.8 oz (73.4 kg)   LMP 01/22/1981 (LMP Unknown)   SpO2 96%   BMI 27.77 kg/m    Physical Exam Vitals reviewed.  Constitutional:      General: She is not in acute distress.    Appearance: Normal appearance. She is not  ill-appearing, toxic-appearing or diaphoretic.  HENT:     Head: Normocephalic and atraumatic.  Eyes:     General: No scleral icterus.       Right eye: No discharge.        Left eye: No discharge.     Conjunctiva/sclera: Conjunctivae normal.  Cardiovascular:     Rate and Rhythm: Normal rate.  Pulmonary:     Effort: Pulmonary effort is normal. No respiratory distress.  Musculoskeletal:        General: Normal range of motion.     Cervical back: Normal range of motion.     Right knee: No swelling, deformity, effusion, erythema, ecchymosis, lacerations, bony tenderness or crepitus. Tenderness present over the medial joint line and patellar tendon. No LCL laxity, MCL laxity, ACL laxity or PCL laxity. Normal alignment, normal meniscus and normal patellar mobility. Normal pulse.     Instability Tests: Anterior drawer test negative. Posterior drawer test negative. Anterior Lachman test negative. Medial McMurray test negative and lateral McMurray test negative.  Skin:    General: Skin is warm and dry.     Capillary Refill: Capillary refill takes less than 2 seconds.  Neurological:     General: No focal deficit present.     Mental Status: She is alert and oriented to person, place, and time. Mental status is at baseline.  Psychiatric:        Mood and Affect: Mood normal.        Behavior: Behavior normal.        Thought Content: Thought content normal.        Judgment: Judgment normal.    Joint Injection/Arthrocentesis  Date/Time: 08/26/2020 12:24 PM Performed by: Loman Brooklyn, FNP Authorized by: Loman Brooklyn, FNP  Indications: pain  Body area: knee Joint: right knee Local anesthesia used: yes  Anesthesia: Local anesthesia used: yes Local anesthetic: ethyl chloride spray.  Sedation: Patient sedated: no  Preparation: Patient was prepped and draped in the usual sterile fashion. Needle gauge: 23G. Ultrasound guidance: no Approach: medial Aspirate amount: 0  mL Methylprednisolone amount: 80 mg Lidocaine 2% amount: 1 mL Patient tolerance: patient tolerated the procedure well with no immediate complications

## 2020-10-06 ENCOUNTER — Ambulatory Visit: Payer: Medicare HMO | Admitting: Family

## 2020-10-07 ENCOUNTER — Encounter: Payer: Self-pay | Admitting: Family

## 2020-12-29 ENCOUNTER — Telehealth: Payer: Self-pay | Admitting: Family

## 2020-12-29 NOTE — Telephone Encounter (Signed)
Left message for patient to call back and schedule Medicare Annual Wellness Visit (AWV)    Last AWV 02/27/14 ; please schedule at anytime with health coach

## 2021-01-13 ENCOUNTER — Other Ambulatory Visit: Payer: Self-pay | Admitting: Cardiology

## 2021-02-08 ENCOUNTER — Telehealth: Payer: Self-pay | Admitting: Family

## 2021-02-08 NOTE — Telephone Encounter (Signed)
LVM for pt to rtn my call to schedule AWV with [facility].  

## 2021-03-16 ENCOUNTER — Telehealth: Payer: Self-pay | Admitting: Family

## 2021-03-16 NOTE — Telephone Encounter (Signed)
No answer unable to leave a message for patient to call back and schedule Medicare Annual Wellness Visit (AWV) to be completed by video or phone.     Please schedule at anytime with [facility]  45 minute appointment  Any questions, please contact me at [phone]

## 2021-04-26 ENCOUNTER — Telehealth: Payer: Self-pay | Admitting: Family

## 2021-04-26 NOTE — Telephone Encounter (Signed)
Left message for patient to call back and schedule Medicare Annual Wellness Visit (AWV) to be completed by video or phone.   Last AWV: 11/2//2017  Please schedule at anytime with [facility]  45 minute appointment  Any questions, please contact me at [phone]

## 2021-05-05 ENCOUNTER — Ambulatory Visit (INDEPENDENT_AMBULATORY_CARE_PROVIDER_SITE_OTHER): Payer: Medicare HMO

## 2021-05-05 ENCOUNTER — Ambulatory Visit (INDEPENDENT_AMBULATORY_CARE_PROVIDER_SITE_OTHER): Payer: Medicare HMO | Admitting: Family

## 2021-05-05 ENCOUNTER — Encounter: Payer: Self-pay | Admitting: Family

## 2021-05-05 VITALS — BP 172/70 | HR 78 | Temp 97.5°F | Ht 64.0 in | Wt 165.6 lb

## 2021-05-05 DIAGNOSIS — E785 Hyperlipidemia, unspecified: Secondary | ICD-10-CM

## 2021-05-05 DIAGNOSIS — Z0000 Encounter for general adult medical examination without abnormal findings: Secondary | ICD-10-CM

## 2021-05-05 DIAGNOSIS — Z0001 Encounter for general adult medical examination with abnormal findings: Secondary | ICD-10-CM | POA: Diagnosis not present

## 2021-05-05 DIAGNOSIS — E559 Vitamin D deficiency, unspecified: Secondary | ICD-10-CM

## 2021-05-05 DIAGNOSIS — K219 Gastro-esophageal reflux disease without esophagitis: Secondary | ICD-10-CM

## 2021-05-05 DIAGNOSIS — E1169 Type 2 diabetes mellitus with other specified complication: Secondary | ICD-10-CM | POA: Diagnosis not present

## 2021-05-05 DIAGNOSIS — S46211A Strain of muscle, fascia and tendon of other parts of biceps, right arm, initial encounter: Secondary | ICD-10-CM

## 2021-05-05 DIAGNOSIS — Z780 Asymptomatic menopausal state: Secondary | ICD-10-CM | POA: Diagnosis not present

## 2021-05-05 DIAGNOSIS — S46212A Strain of muscle, fascia and tendon of other parts of biceps, left arm, initial encounter: Secondary | ICD-10-CM | POA: Diagnosis not present

## 2021-05-05 DIAGNOSIS — S46219A Strain of muscle, fascia and tendon of other parts of biceps, unspecified arm, initial encounter: Secondary | ICD-10-CM

## 2021-05-05 DIAGNOSIS — J4520 Mild intermittent asthma, uncomplicated: Secondary | ICD-10-CM

## 2021-05-05 DIAGNOSIS — R32 Unspecified urinary incontinence: Secondary | ICD-10-CM | POA: Diagnosis not present

## 2021-05-05 DIAGNOSIS — I5022 Chronic systolic (congestive) heart failure: Secondary | ICD-10-CM

## 2021-05-05 DIAGNOSIS — R6889 Other general symptoms and signs: Secondary | ICD-10-CM | POA: Diagnosis not present

## 2021-05-05 DIAGNOSIS — I10 Essential (primary) hypertension: Secondary | ICD-10-CM | POA: Diagnosis not present

## 2021-05-05 DIAGNOSIS — M810 Age-related osteoporosis without current pathological fracture: Secondary | ICD-10-CM | POA: Diagnosis not present

## 2021-05-05 LAB — URINALYSIS, COMPLETE
Bilirubin, UA: NEGATIVE
Ketones, UA: NEGATIVE
Nitrite, UA: NEGATIVE
Specific Gravity, UA: 1.02 (ref 1.005–1.030)
Urobilinogen, Ur: 0.2 mg/dL (ref 0.2–1.0)
pH, UA: 6 (ref 5.0–7.5)

## 2021-05-05 LAB — BAYER DCA HB A1C WAIVED: HB A1C (BAYER DCA - WAIVED): 7.1 % — ABNORMAL HIGH (ref 4.8–5.6)

## 2021-05-05 MED ORDER — DICLOFENAC SODIUM 75 MG PO TBEC: 75.0000 mg | DELAYED_RELEASE_TABLET | Freq: Two times a day (BID) | ORAL | 0 refills | Status: DC

## 2021-05-05 MED ORDER — DICLOFENAC SODIUM 1 % EX GEL: 2.0000 g | Freq: Four times a day (QID) | CUTANEOUS | 1 refills | Status: DC

## 2021-05-05 NOTE — Progress Notes (Signed)
Subjective:    Patient ID: [name], female    DOB: Mar 13, 1946, 76 y.o.   MRN: [id number]  Chief Complaint  Patient presents with   Medical Management of Chronic Issues   Urinary Incontinence   Arm Pain   Pt presets to the office today for CPE and chronic follow up. Has hx of CVA approx 2011.   She is complaining of bilateral bicep pain that started in her right arm and is now both. She reports aching pain of 8 out 10 that is worse when she raises either arm Denies any trauma.   She has not taken several of her medications, because she "forgets".   She is also complaining of urinary incontinence that has started over the last few weeks.  Arm Pain  The incident occurred more than 1 week ago. The quality of the pain is described as aching. The pain is at a severity of 8/10.  Hypertension This is a chronic problem. The current episode started more than 1 year ago. The problem has been waxing and waning since onset. The problem is uncontrolled. Associated symptoms include blurred vision and malaise/fatigue. Pertinent negatives include no peripheral edema or shortness of breath. Risk factors for coronary artery disease include dyslipidemia, diabetes mellitus and sedentary lifestyle. The current treatment provides moderate improvement.  Diabetes She presents for her follow-up diabetic visit. She has type 2 diabetes mellitus. Associated symptoms include blurred vision and foot paresthesias. Symptoms are stable. Diabetic complications include heart disease and peripheral neuropathy. Risk factors for coronary artery disease include dyslipidemia, diabetes mellitus, hypertension, sedentary lifestyle and post-menopausal. She is following a generally unhealthy diet. (Not checking BS at home)  Gastroesophageal Reflux She complains of belching, heartburn and wheezing. She reports no coughing. This is a chronic problem. The current episode started more than 1 year ago. The problem occurs  occasionally. Risk factors include obesity. She has tried a PPI for the symptoms. The treatment provided moderate relief.  Hyperlipidemia This is a chronic problem. The current episode started more than 1 year ago. The problem is uncontrolled. Exacerbating diseases include obesity. Pertinent negatives include no shortness of breath. Current antihyperlipidemic treatment includes statins. The current treatment provides mild improvement of lipids. Risk factors for coronary artery disease include dyslipidemia, hypertension, a sedentary lifestyle and post-menopausal.  Asthma She complains of wheezing. There is no cough or shortness of breath. This is a chronic problem. The current episode started more than 1 year ago. The problem occurs intermittently. Associated symptoms include heartburn and malaise/fatigue. Her symptoms are alleviated by rest and beta-agonist. She reports moderate improvement on treatment. Her past medical history is significant for asthma.     Review of Systems  Constitutional:  Positive for malaise/fatigue.  Eyes:  Positive for blurred vision.  Respiratory:  Positive for wheezing. Negative for cough and shortness of breath.   Gastrointestinal:  Positive for heartburn.  All other systems reviewed and are negative.  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    Employer: [employer]  Tobacco Use   Smoking status:  Never   Smokeless tobacco: Never  Vaping Use   Vaping Use: Never used  Substance and Sexual Activity   Alcohol use: Yes    Comment: rare   Drug use: No   Sexual activity: Never    Birth control/protection: Post-menopausal, Surgical  Other Topics Concern   Not on file  Social History Narrative   Married "67-'8, divorced; remained single   1 son and 1 daughter   2 grandchildren   Left handed   No caffiene         Social Determinants of Health   Financial Resource Strain: Not on file  Food Insecurity: Not on file  Transportation Needs: Not on file  Physical Activity: Not on file  Stress: Not on file  Social Connections: Not on file       Objective:   Physical Exam Vitals reviewed.  Constitutional:      General: She is not in acute distress.    Appearance: She is well-developed. She is obese.  HENT:     Head: Normocephalic and atraumatic.     Right Ear: Tympanic membrane normal.     Left Ear: Tympanic membrane normal.  Eyes:     Pupils: Pupils are equal, round, and reactive to light.  Neck:     Thyroid: No thyromegaly.  Cardiovascular:     Rate and Rhythm: Normal rate and regular rhythm.     Heart sounds: Normal heart sounds. No murmur heard. Pulmonary:     Effort: Pulmonary effort is normal. No respiratory distress.     Breath sounds: Normal breath sounds. No wheezing.  Abdominal:     General: Bowel sounds are normal. There is no distension.     Palpations: Abdomen is soft.     Tenderness: There is no abdominal tenderness.  Musculoskeletal:        General: No tenderness.     Cervical back: Normal range of motion and neck supple.     Comments: Full ROM of bilateral arms, pain in bilateral biceps with abduction.   Skin:    General: Skin is warm and dry.  Neurological:     Mental Status: She is alert and oriented to person, place, and time.     Cranial Nerves: No cranial nerve deficit.     Deep Tendon Reflexes: Reflexes are normal and symmetric.   Psychiatric:        Behavior: Behavior normal.        Thought Content: Thought content normal.        Judgment: Judgment normal.      BP (!) 172/70    Pulse 78    Temp (!) 97.5 F (36.4 C) (Temporal)    Ht _0  (1.626 m)    Wt 165 lb 9.6 oz (75.1 kg)    LMP 01/22/1981 (LMP Unknown)    SpO2 94%    BMI 28.43 kg/m      Assessment & Plan:  MARKESIA CRILLY comes in today with chief complaint of Medical Management of Chronic Issues, Urinary Incontinence, and Arm Pain   Diagnosis and orders addressed:  1. Chronic systolic heart failure (HCC) - CMP14+EGFR  2. Essential  hypertension Pt will restart medications Follow up in 2-4 weeks - CMP14+EGFR  3. Mild intermittent asthma, unspecified whether complicated - NEY97+QOYP  4. Gastroesophageal reflux disease, unspecified whether esophagitis present - CMP14+EGFR  5. Type 2 diabetes mellitus with other specified complication, without long-term current use of insulin ([place]) Labs pending I'm sure will be elevated, has been off medication.  - Bayer DCA Hb A1c Waived - CMP14+EGFR - Microalbumin / creatinine urine ratio  6. Vitamin D deficiency - CMP14+EGFR  7. Hyperlipidemia associated with type 2 diabetes mellitus (HCC) - CMP14+EGFR  8. Annual physical exam - Bayer DCA Hb A1c Waived - CMP14+EGFR - Anemia Profile B - Microalbumin / creatinine urine ratio - Lipid panel - TSH - VITAMIN D 25 Hydroxy (Vit-D Deficiency, Fractures)  9. Post-menopause - CMP14+EGFR - DG [facility] DEXA  10. Strain of biceps muscle, unspecified laterality, initial encounter Start diclofenac BID with food No other NSAID's  Voltaren gel - diclofenac (VOLTAREN) 75 MG EC tablet; Take 1 tablet (75 mg total) by mouth 2 (two) times daily.  Dispense: 30 tablet; Refill: 0 - diclofenac Sodium (VOLTAREN) 1 % GEL; Apply 2 g topically 4 (four) times daily.  Dispense: 150 g; Refill: 1  11. Urinary incontinence, unspecified type - Urinalysis, Complete - Urine  Culture   Labs pending Health Maintenance reviewed Diet and exercise encouraged  Follow up plan: 2-4 weeks to recheck HTN, DM, arm pain, and urinary incontinence.   Evelina Dun, FNP

## 2021-05-05 NOTE — Patient Instructions (Signed)
Health Maintenance After Age 77 After age 77, you are at a higher risk for certain long-term diseases and infections as well as injuries from falls. Falls are a major cause of broken bones and head injuries in people who are older than age 77. Getting regular preventive care can help to keep you healthy and well. Preventive care includes getting regular testing and making lifestyle changes as recommended by your health care provider. Talk with your health care provider about: Which screenings and tests you should have. A screening is a test that checks for a disease when you have no symptoms. A diet and exercise plan that is right for you. What should I know about screenings and tests to prevent falls? Screening and testing are the best ways to find a health problem early. Early diagnosis and treatment give you the best chance of managing medical conditions that are common after age 77. Certain conditions and lifestyle choices may make you more likely to have a fall. Your health care provider may recommend: Regular vision checks. Poor vision and conditions such as cataracts can make you more likely to have a fall. If you wear glasses, make sure to get your prescription updated if your vision changes. Medicine review. Work with your health care provider to regularly review all of the medicines you are taking, including over-the-counter medicines. Ask your health care provider about any side effects that may make you more likely to have a fall. Tell your health care provider if any medicines that you take make you feel dizzy or sleepy. Strength and balance checks. Your health care provider may recommend certain tests to check your strength and balance while standing, walking, or changing positions. Foot health exam. Foot pain and numbness, as well as not wearing proper footwear, can make you more likely to have a fall. Screenings, including: Osteoporosis screening. Osteoporosis is a condition that causes  the bones to get weaker and break more easily. Blood pressure screening. Blood pressure changes and medicines to control blood pressure can make you feel dizzy. Depression screening. You may be more likely to have a fall if you have a fear of falling, feel depressed, or feel unable to do activities that you used to do. Alcohol use screening. Using too much alcohol can affect your balance and may make you more likely to have a fall. Follow these instructions at home: Lifestyle Do not drink alcohol if: Your health care provider tells you not to drink. If you drink alcohol: Limit how much you have to: 0-1 drink a day for women. 0-2 drinks a day for men. Know how much alcohol is in your drink. In the [place] one drink equals one 12 oz bottle of beer (355 mL), one 5 oz glass of wine (148 mL), or one 1 oz glass of hard liquor (44 mL). Do not use any products that contain nicotine or tobacco. These products include cigarettes, chewing tobacco, and vaping devices, such as e-cigarettes. If you need help quitting, ask your health care provider. Activity  Follow a regular exercise program to stay fit. This will help you maintain your balance. Ask your health care provider what types of exercise are appropriate for you. If you need a cane or walker, use it as recommended by your health care provider. Wear supportive shoes that have nonskid soles. Safety  Remove any tripping hazards, such as rugs, cords, and clutter. Install safety equipment such as grab bars in bathrooms and safety rails on stairs. Keep rooms and walkways   well-lit. General instructions Talk with your health care provider about your risks for falling. Tell your health care provider if: You fall. Be sure to tell your health care provider about all falls, even ones that seem minor. You feel dizzy, tiredness (fatigue), or off-balance. Take over-the-counter and prescription medicines only as told by your health care provider. These include  supplements. Eat a healthy diet and maintain a healthy weight. A healthy diet includes low-fat dairy products, low-fat (lean) meats, and fiber from whole grains, beans, and lots of fruits and vegetables. Stay current with your vaccines. Schedule regular health, dental, and eye exams. Summary Having a healthy lifestyle and getting preventive care can help to protect your health and wellness after age 77. Screening and testing are the best way to find a health problem early and help you avoid having a fall. Early diagnosis and treatment give you the best chance for managing medical conditions that are more common for people who are older than age 77. Falls are a major cause of broken bones and head injuries in people who are older than age 77. Take precautions to prevent a fall at home. Work with your health care provider to learn what changes you can make to improve your health and wellness and to prevent falls. This information is not intended to replace advice given to you by your health care provider. Make sure you discuss any questions you have with your health care provider. Document Revised: 08/16/2020 Document Reviewed: 08/16/2020 Elsevier Patient Education  2022 Elsevier Inc.  

## 2021-05-06 ENCOUNTER — Other Ambulatory Visit: Payer: Self-pay | Admitting: Family

## 2021-05-06 ENCOUNTER — Other Ambulatory Visit: Payer: Self-pay | Admitting: Family Medicine

## 2021-05-06 ENCOUNTER — Other Ambulatory Visit: Payer: Self-pay | Admitting: Cardiology

## 2021-05-06 DIAGNOSIS — I10 Essential (primary) hypertension: Secondary | ICD-10-CM

## 2021-05-06 LAB — MICROALBUMIN / CREATININE URINE RATIO
Creatinine, Urine: 86.2 mg/dL
Microalb/Creat Ratio: 145 mg/g creat — ABNORMAL HIGH (ref 0–29)
Microalbumin, Urine: 125.1 ug/mL

## 2021-05-06 LAB — ANEMIA PROFILE B
Basophils Absolute: 0.1 10*3/uL (ref 0.0–0.2)
Basos: 1 %
EOS (ABSOLUTE): 0.1 10*3/uL (ref 0.0–0.4)
Eos: 2 %
Ferritin: 166 ng/mL — ABNORMAL HIGH (ref 15–150)
Folate: 9.6 ng/mL (ref >3.0)
Hematocrit: 43.6 % (ref 34.0–46.6)
Hemoglobin: 14.5 g/dL (ref 11.1–15.9)
Immature Grans (Abs): 0 10*3/uL (ref 0.0–0.1)
Immature Granulocytes: 0 %
Iron Saturation: 22 % (ref 15–55)
Iron: 65 ug/dL (ref 27–139)
Lymphocytes Absolute: 2.3 10*3/uL (ref 0.7–3.1)
Lymphs: 28 %
MCH: 28.9 pg (ref 26.6–33.0)
MCHC: 33.3 g/dL (ref 31.5–35.7)
MCV: 87 fL (ref 79–97)
Monocytes Absolute: 0.6 10*3/uL (ref 0.1–0.9)
Monocytes: 8 %
Neutrophils Absolute: 5.1 10*3/uL (ref 1.4–7.0)
Neutrophils: 61 %
Platelets: 268 10*3/uL (ref 150–450)
RBC: 5.01 x10E6/uL (ref 3.77–5.28)
RDW: 13.4 % (ref 11.7–15.4)
Retic Ct Pct: 1.4 % (ref 0.6–2.6)
Total Iron Binding Capacity: 293 ug/dL (ref 250–450)
UIBC: 228 ug/dL (ref 118–369)
Vitamin B-12: 333 pg/mL (ref 232–1245)
WBC: 8.2 10*3/uL (ref 3.4–10.8)

## 2021-05-06 LAB — CMP14+EGFR
ALT: 12 IU/L (ref 0–32)
AST: 14 IU/L (ref 0–40)
Albumin/Globulin Ratio: 1.9 (ref 1.2–2.2)
Albumin: 4.2 g/dL (ref 3.7–4.7)
Alkaline Phosphatase: 73 IU/L (ref 44–121)
BUN/Creatinine Ratio: 23 (ref 12–28)
BUN: 22 mg/dL (ref 8–27)
Bilirubin Total: 0.3 mg/dL (ref 0.0–1.2)
CO2: 24 mmol/L (ref 20–29)
Calcium: 9.3 mg/dL (ref 8.7–10.3)
Chloride: 109 mmol/L — ABNORMAL HIGH (ref 96–106)
Creatinine, Ser: 0.97 mg/dL (ref 0.57–1.00)
Globulin, Total: 2.2 g/dL (ref 1.5–4.5)
Glucose: 142 mg/dL — ABNORMAL HIGH (ref 70–99)
Potassium: 3.9 mmol/L (ref 3.5–5.2)
Sodium: 146 mmol/L — ABNORMAL HIGH (ref 134–144)
Total Protein: 6.4 g/dL (ref 6.0–8.5)
eGFR: 61 mL/min/{1.73_m2} (ref >59)

## 2021-05-06 LAB — LIPID PANEL
Chol/HDL Ratio: 5.2 ratio — ABNORMAL HIGH (ref 0.0–4.4)
Cholesterol, Total: 208 mg/dL — ABNORMAL HIGH (ref 100–199)
HDL: 40 mg/dL (ref >39)
LDL Chol Calc (NIH): 125 mg/dL — ABNORMAL HIGH (ref 0–99)
Triglycerides: 240 mg/dL — ABNORMAL HIGH (ref 0–149)
VLDL Cholesterol Cal: 43 mg/dL — ABNORMAL HIGH (ref 5–40)

## 2021-05-06 LAB — TSH: TSH: 1.28 u[IU]/mL (ref 0.450–4.500)

## 2021-05-06 LAB — VITAMIN D 25 HYDROXY (VIT D DEFICIENCY, FRACTURES): Vit D, 25-Hydroxy: 20.8 ng/mL — ABNORMAL LOW (ref 30.0–100.0)

## 2021-05-06 MED ORDER — ROSUVASTATIN CALCIUM 10 MG PO TABS: 10.0000 mg | ORAL_TABLET | Freq: Every day | ORAL | 3 refills | Status: DC

## 2021-05-06 MED ORDER — ALENDRONATE SODIUM 70 MG PO TABS: 70.0000 mg | ORAL_TABLET | ORAL | 11 refills | Status: AC

## 2021-05-06 MED ORDER — VITAMIN D (ERGOCALCIFEROL) 1.25 MG (50000 UNIT) PO CAPS: 50000.0000 [IU] | ORAL_CAPSULE | ORAL | 3 refills | Status: DC

## 2021-05-07 LAB — URINE CULTURE

## 2021-05-09 ENCOUNTER — Other Ambulatory Visit: Payer: Self-pay | Admitting: Family

## 2021-05-09 DIAGNOSIS — I10 Essential (primary) hypertension: Secondary | ICD-10-CM

## 2021-05-09 MED ORDER — AMLODIPINE BESYLATE 10 MG PO TABS: 10.0000 mg | ORAL_TABLET | Freq: Every day | ORAL | 2 refills | Status: AC

## 2021-05-09 MED ORDER — BISOPROLOL FUMARATE 5 MG PO TABS: 5.0000 mg | ORAL_TABLET | Freq: Every day | ORAL | 2 refills | Status: DC

## 2021-05-10 ENCOUNTER — Other Ambulatory Visit: Payer: Self-pay | Admitting: Family

## 2021-05-10 MED ORDER — OXYBUTYNIN CHLORIDE ER 10 MG PO TB24: 10.0000 mg | ORAL_TABLET | Freq: Every day | ORAL | 1 refills | Status: AC

## 2021-05-11 ENCOUNTER — Telehealth: Payer: Self-pay | Admitting: Family

## 2021-05-11 DIAGNOSIS — R32 Unspecified urinary incontinence: Secondary | ICD-10-CM

## 2021-05-12 NOTE — Telephone Encounter (Signed)
Called and spoke with patient she is wanting a referral put in for urinary incontinence. Per last visit mention will place referral.

## 2021-05-16 MED ORDER — SOLIFENACIN SUCCINATE 5 MG PO TABS: 5.0000 mg | ORAL_TABLET | Freq: Every day | ORAL | 1 refills | Status: DC

## 2021-05-17 ENCOUNTER — Telehealth: Payer: Self-pay | Admitting: Family

## 2021-05-17 NOTE — Telephone Encounter (Signed)
Left message for patient to call back and schedule Medicare Annual Wellness Visit (AWV) to be completed by video or phone.   Last AWV: 02/27/2014  Please schedule at anytime with [place]  45 minute appointment  Any questions, please contact me at [phone]

## 2021-05-18 ENCOUNTER — Other Ambulatory Visit: Payer: Self-pay

## 2021-05-18 ENCOUNTER — Encounter: Payer: Self-pay | Admitting: Physician Assistant

## 2021-05-18 ENCOUNTER — Ambulatory Visit (INDEPENDENT_AMBULATORY_CARE_PROVIDER_SITE_OTHER): Payer: Medicare HMO | Admitting: Physician Assistant

## 2021-05-18 VITALS — BP 165/71 | HR 54 | Wt 164.0 lb

## 2021-05-18 DIAGNOSIS — R310 Gross hematuria: Secondary | ICD-10-CM

## 2021-05-18 DIAGNOSIS — N3001 Acute cystitis with hematuria: Secondary | ICD-10-CM

## 2021-05-18 DIAGNOSIS — R32 Unspecified urinary incontinence: Secondary | ICD-10-CM | POA: Diagnosis not present

## 2021-05-18 DIAGNOSIS — R3129 Other microscopic hematuria: Secondary | ICD-10-CM | POA: Diagnosis not present

## 2021-05-18 LAB — URINALYSIS, ROUTINE W REFLEX MICROSCOPIC
Bilirubin, UA: NEGATIVE
Glucose, UA: NEGATIVE
Ketones, UA: NEGATIVE
Nitrite, UA: NEGATIVE
Specific Gravity, UA: 1.025 (ref 1.005–1.030)
Urobilinogen, Ur: 0.2 mg/dL (ref 0.2–1.0)
pH, UA: 5.5 (ref 5.0–7.5)

## 2021-05-18 LAB — MICROSCOPIC EXAMINATION
RBC, Urine: >30 /hpf — AB (ref 0–2)
Renal Epithel, UA: NONE SEEN /hpf

## 2021-05-18 LAB — BLADDER SCAN AMB NON-IMAGING: Scan Result: 1

## 2021-05-18 MED ORDER — NITROFURANTOIN MONOHYD MACRO 100 MG PO CAPS: 100.0000 mg | ORAL_CAPSULE | Freq: Two times a day (BID) | ORAL | 0 refills | Status: AC

## 2021-05-18 NOTE — Progress Notes (Signed)
Assessment: 1. Urinary incontinence, unspecified type   2. Microscopic hematuria   3. Gross hematuria   4. Acute cystitis with hematuria      Plan: Today I had a discussion with the patient regarding the findings of gross hematuria including the implications and differential diagnoses associated with it.  I also discussed recommendations for further evaluation including the rationale for upper tract imaging and cystoscopy.  I discussed the nature of these procedures including potential risk and complications.  The patient expressed an understanding of these issues. Urine cx ordered and Rx for Macrobid sent to pharmacy. FU after CT study for cystoscopy. Will consider meds for continence issues after hematuria workup and tx of current UTI    Chief Complaint:  Incontinence  History of Present Illness:  [name] is a 76 y.o. year old female with a history of chronic systolic heart failure, hypertension, asthma, GERD, type 2 diabetes mellitus, and hyperlipidemia who is seen in consultation from Evelina Dun A, FNP for evaluation of new onset urinary incontinence. The pt reports a 3-week history of sudden onset urge and stress urinary incontinence.  The patient was evaluated by her primary care provider 05/05/2021 for her symptoms and was noted to have a urine dip with 2+ leukocytes.  Urine culture grew 10-25,000 colonies of mixed urogenital flora.  No antibiotics prescribed.  Patient was prescribed oxybutynin which has not improved her incontinence symptoms.  Today, she complains of urinary frequency, nocturia 3-5 times per night, and persistent urgency.  She is wearing up to 3 pads per day for her symptoms and has been doing so consistently for the last 3 weeks.  She reports no significant incontinence prior to this.  She denies history of urinary tract infections in the past, but does recall treatment for kidney stenting approximately 20 years ago.  Patient also reports 1 episode of gross  hematuria several years ago for which she never sought evaluation.  No gross hematuria at present.  She also denies burning, dysuria.  No abdominal pain at present.  No fever, chills, nausea or vomiting.  Medical records personally reviewed including office notes and laboratory studies.  Patient is noted to have microscopic hematuria ongoing for approximately 6 years. No imaging studies have been done. Pt has no smoking hx. UA 11-30WBC, >30RBC, many bacteria PVR 74ml  Urine Cx 05/05/20 10-25K mixed urogenital flora 2016 No growth  Past Medical History:  Past Medical History:  Diagnosis Date   Asthma    Chronic systolic heart failure ([place])    Echo (08/26/13):  Mild LVH. EF 25% to 30%. Diffuse HK. Aortic valve: There was trivial regurgitation. Mitral valve: There was mild regurgitation. Left atrium: The atrium was mildly dilated.   GERD (gastroesophageal reflux disease)    Headache(784.0)    History of nephrolithiasis    Hypertension    NICM (nonischemic cardiomyopathy) ([place])    a. LHC (08/2013):  no CAD   Sleep apnea    does not use machine   Stroke (HCC)    Type II or unspecified type diabetes mellitus without mention of complication, uncontrolled    borderline    Past Surgical History:  Past Surgical History:  Procedure Laterality Date   ABDOMINAL HYSTERECTOMY     COLONOSCOPY WITH PROPOFOL N/A 07/01/2019   Procedure: COLONOSCOPY WITH PROPOFOL;  Surgeon: Danie Binder, MD;  Location: [facility];  Service: Endoscopy;  Laterality: N/A;  2:15pm - pt will not move up   LEFT AND RIGHT HEART  CATHETERIZATION WITH CORONARY ANGIOGRAM N/A 09/03/2013   Procedure: LEFT AND RIGHT HEART CATHETERIZATION WITH CORONARY ANGIOGRAM;  Surgeon: [name], MD;  Location: [facility] CATH LAB;  Service: Cardiovascular;  Laterality: N/A;   OOPHORECTOMY     removed 1 ovary and 1 ovary remains.   POLYPECTOMY  07/01/2019   Procedure: POLYPECTOMY;  Surgeon: Danie Binder, MD;  Location: [facility];   Service: Endoscopy;;   ureteral extraction of kidney stone      Allergies:  Allergies  Allergen Reactions   Levaquin [Levofloxacin In D5w] Diarrhea   Penicillins Rash    Family History:  Family History  Problem Relation Age of Onset   Hypertension Mother    Arthritis Mother    CAD Mother 3   Hip fracture Mother 55   COPD Father    Crohn's disease Sister    Cancer Sister        lung   Early death Brother 43       house fire   Diabetes Maternal Grandmother    Hypertension Sister    Hypertension Brother    Colon cancer Neg Hx     Social History:  Social History   Tobacco Use   Smoking status: Never   Smokeless tobacco: Never  Vaping Use   Vaping Use: Never used  Substance Use Topics   Alcohol use: Yes    Comment: rare   Drug use: No    Review of symptoms:  Constitutional:  Negative for unexplained weight loss, night sweats, fever, chills ENT:  Negative for nose bleeds, sinus pain, painful swallowing CV:  Negative for chest pain, shortness of breath, exercise intolerance, palpitations, loss of consciousness Resp:  Negative for cough, wheezing, shortness of breath GI:  Negative for nausea, vomiting, diarrhea, bloody stools GU:  Positives noted in HPI Neuro:  Negative for seizures, poor balance, limb weakness, slurred speech Psych:  Negative fordepression, anxiety Endocrine:  Negative for polydipsia, polyuria, symptoms of hypoglycemia (dizziness, hunger, sweating) Hematologic:  Negative for anemia, purpura, petechia, prolonged or excessive bleeding, use of anticoagulants  Allergic:  Negative for difficulty breathing or choking as a result of exposure to anything; no shellfish allergy; no allergic response (rash/itch) to materials, foods  Physical exam: BP (!) 165/71    Pulse (!) 54    Wt 164 lb (74.4 kg)    LMP 01/22/1981 (LMP Unknown)    BMI 28.15 kg/m  GENERAL APPEARANCE:  Well appearing, well developed, well nourished, NAD HEENT: Atraumatic, Normocephalic,  oropharynx clear. NECK: Supple without lymphadenopathy or thyromegaly. LUNGS: Clear to auscultation bilaterally. HEART: Regular Rate and Rhythm without murmurs, gallops, or rubs. ABDOMEN: Soft, non-tender, No Masses. EXTREMITIES: Moves all extremities well.  Without clubbing, cyanosis, or edema. NEUROLOGIC:  Alert and oriented x 3, normal gait, CN II-XII grossly intact.  MENTAL STATUS:  Appropriate. BACK:  Non-tender to palpation.  No CVAT SKIN:  Warm, dry and intact.    Results: Results for orders placed or performed in visit on 05/18/21 (from the past 24 hour(s))  BLADDER SCAN AMB NON-IMAGING   Collection Time: 05/18/21  2:16 PM  Result Value Ref Range   Scan Result 1 ml   Microscopic Examination   Collection Time: 05/18/21  3:11 PM   Urine  Result Value Ref Range   WBC, UA 11-30 (A) 0 - 5 /hpf   RBC >30 (A) 0 - 2 /hpf   Epithelial Cells (non renal) 0-10 0 - 10 /hpf   Renal Epithel, UA None seen  None seen /hpf   Mucus, UA Present Not Estab.   Bacteria, UA Many (A) None seen/Few  Urinalysis, Routine w reflex microscopic   Collection Time: 05/18/21  3:11 PM  Result Value Ref Range   Specific Gravity, UA 1.025 1.005 - 1.030   pH, UA 5.5 5.0 - 7.5   Color, UA Amber (A) Yellow   Appearance Ur Clear Clear   Leukocytes,UA 2+ (A) Negative   Protein,UA 2+ (A) Negative/Trace   Glucose, UA Negative Negative   Ketones, UA Negative Negative   RBC, UA 3+ (A) Negative   Bilirubin, UA Negative Negative   Urobilinogen, Ur 0.2 0.2 - 1.0 mg/dL   Nitrite, UA Negative Negative   Microscopic Examination See below:

## 2021-05-18 NOTE — Progress Notes (Signed)
post void residual =1ml 

## 2021-05-20 LAB — URINE CULTURE

## 2021-05-23 ENCOUNTER — Telehealth: Payer: Self-pay

## 2021-05-23 ENCOUNTER — Other Ambulatory Visit: Payer: Self-pay

## 2021-05-23 ENCOUNTER — Ambulatory Visit (HOSPITAL_COMMUNITY)
Admission: RE | Admit: 2021-05-23 | Discharge: 2021-05-23 | Disposition: A | Discharge status: Home / Self Care | Payer: Medicare HMO | Source: Ambulatory Visit | Attending: Physician Assistant | Admitting: Physician Assistant

## 2021-05-23 DIAGNOSIS — R3129 Other microscopic hematuria: Secondary | ICD-10-CM | POA: Diagnosis not present

## 2021-05-23 DIAGNOSIS — I700 Atherosclerosis of aorta: Secondary | ICD-10-CM | POA: Diagnosis not present

## 2021-05-23 DIAGNOSIS — N200 Calculus of kidney: Secondary | ICD-10-CM | POA: Diagnosis not present

## 2021-05-23 DIAGNOSIS — R310 Gross hematuria: Secondary | ICD-10-CM | POA: Diagnosis not present

## 2021-05-23 MED ORDER — IOHEXOL 300 MG/ML  SOLN
100.0000 mL | Freq: Once | INTRAMUSCULAR | Status: AC | PRN
  Administered 2021-05-23: 100 mL via INTRAVENOUS

## 2021-05-23 NOTE — Telephone Encounter (Signed)
-----   Message from Reynaldo Minium, [place] sent at 05/23/2021  3:04 PM EST ----- Please let pt know that her CT shows bilateral kidney stones. Dr. Alyson Ingles would like to go over the CT with her and see her sooner than her current appt on 3/3. Please work her in on his schedule as soon as possible. Thanks! ----- Message ----- From: Interface, Rad Results In Sent: 05/23/2021   2:19 PM EST To: Reynaldo Minium, PA-C

## 2021-05-23 NOTE — Telephone Encounter (Signed)
Called patient. No answer. Left message to return call to office.

## 2021-05-26 NOTE — Telephone Encounter (Signed)
Scheduled with patient for 05/27/2021

## 2021-05-26 NOTE — Telephone Encounter (Signed)
Called x 2 to inform patient of results. Left message.

## 2021-05-27 ENCOUNTER — Encounter: Payer: Self-pay | Admitting: Urology

## 2021-05-27 ENCOUNTER — Ambulatory Visit (INDEPENDENT_AMBULATORY_CARE_PROVIDER_SITE_OTHER): Payer: Medicare HMO | Admitting: Urology

## 2021-05-27 ENCOUNTER — Other Ambulatory Visit: Payer: Self-pay

## 2021-05-27 VITALS — BP 198/84 | HR 52 | Wt 157.0 lb

## 2021-05-27 DIAGNOSIS — R32 Unspecified urinary incontinence: Secondary | ICD-10-CM

## 2021-05-27 DIAGNOSIS — N200 Calculus of kidney: Secondary | ICD-10-CM

## 2021-05-27 NOTE — Progress Notes (Signed)
[facility] Surgery Posting Form    Surgery Date/Time: 05/31/2021   Surgeon: Dr. Nicolette Bang  Surgery Location: [facility]  Diagnosis: left renal calculus   CPT: 50590   Surgery: left ESWL  Stop Anticoagulations: n/a   Cardiac/Medical/Pulmonary Clearance needed: n/a   Orders entered into EPIC  Date: 05/27/2021    Case booked in [place]  Date: 05/27/2021   Notified pt of Surgery: Date: 05/27/2021   Placed into Prior Authorization Work Fabio Bering Date: no precert needed     Assistant/laser/rep:none    I spoke with [place]. We have discussed possible surgery dates and 05/31/2021 was agreed upon by all parties. Patient given information about surgery date, what to expect pre-operatively and post operatively.    We discussed that a pre-op nurse will be calling to set up the pre-op visit that will take place prior to surgery. Informed patient that our office will communicate any additional care to be provided after surgery.    Patients questions or concerns were discussed during our call. Advised to call our office should there be any additional information, questions or concerns that arise. Patient verbalized understanding.

## 2021-05-27 NOTE — H&P (View-Only) (Signed)
05/27/2021 10:53 AM   [name] 1945-05-07 [id number]  Referring provider: Sharion Balloon, [address],  [place] [address]  nephrolithiasis  HPI: [name] is a 76OK here for evaluation of nephrolithiasis. She developed new onset urge incontinence and gross hematuria 3-4 weeks ago. She presented to our office and underwent CT hematuria protocol which showed bilateral large renal calculi.  The hematuria has since resolved. She has a 2cm right UPJ calculus and a left 1.7cm renal pelvis calculus. She has chronic bilateral back/flank pain. Urine culture 2/8 was negative.    PMH: Past Medical History:  Diagnosis Date   Asthma    Chronic systolic heart failure ([place])    Echo (08/26/13):  Mild LVH. EF 25% to 30%. Diffuse HK. Aortic valve: There was trivial regurgitation. Mitral valve: There was mild regurgitation. Left atrium: The atrium was mildly dilated.   GERD (gastroesophageal reflux disease)    Headache(784.0)    History of nephrolithiasis    Hypertension    NICM (nonischemic cardiomyopathy) ([place])    a. LHC (08/2013):  no CAD   Sleep apnea    does not use machine   Stroke ([facility])    Type II or unspecified type diabetes mellitus without mention of complication, uncontrolled    borderline    Surgical History: Past Surgical History:  Procedure Laterality Date   ABDOMINAL HYSTERECTOMY     COLONOSCOPY WITH PROPOFOL N/A 07/01/2019   Procedure: COLONOSCOPY WITH PROPOFOL;  Surgeon: Danie Binder, MD;  Location: [facility];  Service: Endoscopy;  Laterality: N/A;  2:15pm - pt will not move up   LEFT AND RIGHT HEART CATHETERIZATION WITH CORONARY ANGIOGRAM N/A 09/03/2013   Procedure: LEFT AND RIGHT HEART CATHETERIZATION WITH CORONARY ANGIOGRAM;  Surgeon: [name], MD;  Location: [facility] CATH LAB;  Service: Cardiovascular;  Laterality: N/A;   OOPHORECTOMY     removed 1 ovary and 1 ovary remains.   POLYPECTOMY  07/01/2019   Procedure: POLYPECTOMY;   Surgeon: Danie Binder, MD;  Location: [facility];  Service: Endoscopy;;   ureteral extraction of kidney stone      Home Medications:  Allergies as of 05/27/2021       Reactions   Levaquin [levofloxacin In D5w] Diarrhea   Penicillins Rash        Medication List        Accurate as of May 27, 2021 10:53 AM. If you have any questions, ask your nurse or doctor.          albuterol 108 (90 Base) MCG/ACT inhaler Commonly known as: VENTOLIN HFA Inhale 2 puffs into the lungs every 6 (six) hours as needed for wheezing or shortness of breath.   alendronate 70 MG tablet Commonly known as: Fosamax Take 1 tablet (70 mg total) by mouth every 7 (seven) days. Take with a full glass of water on an empty stomach.   amLODipine 10 MG tablet Commonly known as: NORVASC Take 1 tablet (10 mg total) by mouth daily.   bisoprolol 5 MG tablet Commonly known as: ZEBETA Take 1 tablet (5 mg total) by mouth daily.   diclofenac 75 MG EC tablet Commonly known as: VOLTAREN Take 1 tablet (75 mg total) by mouth 2 (two) times daily.   diclofenac Sodium 1 % Gel Commonly known as: Voltaren Apply 2 g topically 4 (four) times daily.   Entresto 97-103 MG Generic drug: sacubitril-valsartan Take 1 tablet by mouth twice daily   glucose blood test strip  Commonly known as: Printmaker as instructed   OneTouch Delica Lancets Fine Misc Use to check BG once daily   oxybutynin 10 MG 24 hr tablet Commonly known as: Ditropan XL Take 1 tablet (10 mg total) by mouth daily.   rosuvastatin 10 MG tablet Commonly known as: Crestor Take 1 tablet (10 mg total) by mouth daily.   solifenacin 5 MG tablet Commonly known as: VESICARE Take 1 tablet (5 mg total) by mouth daily.   Vitamin D (Ergocalciferol) 1.25 MG (50000 UNIT) Caps capsule Commonly known as: DRISDOL Take 1 capsule (50,000 Units total) by mouth every 7 (seven) days.        Allergies:  Allergies  Allergen Reactions    Levaquin [Levofloxacin In D5w] Diarrhea   Penicillins Rash    Family History: Family History  Problem Relation Age of Onset   Hypertension Mother    Arthritis Mother    CAD Mother 25   Hip fracture Mother 14   COPD Father    Crohn's disease Sister    Cancer Sister        lung   Early death Brother 28       house fire   Diabetes Maternal Grandmother    Hypertension Sister    Hypertension Brother    Colon cancer Neg Hx     Social History:  reports that she has never smoked. She has never used smokeless tobacco. She reports current alcohol use. She reports that she does not use drugs.  ROS: All other review of systems were reviewed and are negative except what is noted above in HPI  Physical Exam: BP (!) 198/84    Pulse (!) 52    Wt 157 lb (71.2 kg)    LMP 01/22/1981 (LMP Unknown)    BMI 26.95 kg/m   Constitutional:  Alert and oriented, No acute distress. HEENT: [place] AT, moist mucus membranes.  Trachea midline, no masses. Cardiovascular: No clubbing, cyanosis, or edema. Respiratory: Normal respiratory effort, no increased work of breathing. GI: Abdomen is soft, nontender, nondistended, no abdominal masses GU: No CVA tenderness.  Lymph: No cervical or inguinal lymphadenopathy. Skin: No rashes, bruises or suspicious lesions. Neurologic: Grossly intact, no focal deficits, moving all 4 extremities. Psychiatric: Normal mood and affect.  Laboratory Data: Lab Results  Component Value Date   WBC 8.2 05/05/2021   HGB 14.5 05/05/2021   HCT 43.6 05/05/2021   MCV 87 05/05/2021   PLT 268 05/05/2021    Lab Results  Component Value Date   CREATININE 0.97 05/05/2021    No results found for: PSA  No results found for: TESTOSTERONE  Lab Results  Component Value Date   HGBA1C 7.1 (H) 05/05/2021    Urinalysis    Component Value Date/Time   COLORURINE YELLOW 12/16/2013 1120   APPEARANCEUR Clear 05/18/2021 1511   LABSPEC 1.013 12/16/2013 1120   PHURINE 6.5 12/16/2013 1120    GLUCOSEU Negative 05/18/2021 1511   HGBUR MODERATE (A) 12/16/2013 1120   BILIRUBINUR Negative 05/18/2021 1511   KETONESUR NEGATIVE 12/16/2013 1120   PROTEINUR 2+ (A) 05/18/2021 1511   PROTEINUR 30 (A) 12/16/2013 1120   UROBILINOGEN negative 07/24/2014 1519   UROBILINOGEN 0.2 12/16/2013 1120   NITRITE Negative 05/18/2021 1511   NITRITE NEGATIVE 12/16/2013 1120   LEUKOCYTESUR 2+ (A) 05/18/2021 1511    Lab Results  Component Value Date   LABMICR See below: 05/18/2021   WBCUA 11-30 (A) 05/18/2021   LABEPIT 0-10 05/18/2021   MUCUS Present 05/18/2021  BACTERIA Many (A) 05/18/2021    Pertinent Imaging: CT hematuria 05/23/2021: Images reviewed and discussed with the patient  Results for orders placed in visit on 11/13/12  DG Abd 1 View  Narrative *RADIOLOGY REPORT*  Clinical Data: 76 year old female with abdominal pain.  ABDOMEN - 1 VIEW  Comparison: CT abdomen and pelvis 10/04/2001.  Findings: Multiple bilateral renal calculi, bulky centrally and now measuring up to 18-19 mm in size bilaterally.  These have progressed since 2003.  Moderate dextroconvex lumbar scoliosis appears mildly progressed. Visualized bowel gas pattern is nonobstructed.  No acute osseous abnormality identified.  IMPRESSION: Progressed bilateral nephrolithiasis since 2003.  Nonobstructed bowel gas pattern.  Clinically significant discrepancy from primary report, if provided: None   Original Report Authenticated By: Roselyn Reef, M.D.  No results found for this or any previous visit.  No results found for this or any previous visit.  No results found for this or any previous visit.  No results found for this or any previous visit.  No results found for this or any previous visit.  Results for orders placed in visit on 05/18/21  CT HEMATURIA WORKUP  Narrative CLINICAL DATA:  Hematuria over the last 3 weeks. History of diabetes and hypertension. Cystitis. Urinary  incontinence.  EXAM: CT ABDOMEN AND PELVIS WITHOUT AND WITH CONTRAST  TECHNIQUE: Multidetector CT imaging of the abdomen and pelvis was performed following the standard protocol before and following the bolus administration of intravenous contrast.  RADIATION DOSE REDUCTION: This exam was performed according to the departmental dose-optimization program which includes automated exposure control, adjustment of the mA and/or kV according to patient size and/or use of iterative reconstruction technique.  CONTRAST:  148mL OMNIPAQUE IOHEXOL 300 MG/ML  SOLN  COMPARISON:  CT abdomen 10/04/2001  FINDINGS: Lower chest: Mild scarring in the right middle lobe, right lower lobe, and lingula. 4 by 3 mm pleural-based nodule along the left lower lobe on image 61 series 15, not changed from 05/11/2020. Small to moderate-sized hiatal hernia. Mild cardiomegaly. Descending thoracic aortic atherosclerosis.  Hepatobiliary: Contracted gallbladder.  Otherwise unremarkable.  Pancreas: Unremarkable  Spleen: Unremarkable  Adrenals/Urinary Tract: Both adrenal glands appear normal.  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 A branching 2.2 by 1.7 cm collecting system staghorn calculus in the right renal pelvis is associated with lower pole and mid kidney caliectasis  2.5 by 1.8 cm filling defect in the right mid to lower kidney infundibulum and calyx proximal to the stone on image 56 series 16 does not appear to enhance and accordingly favors plan thrombus over tumor. Similar filling defect is observed in much of the anterior kidney collecting system as shown for example on image 37 series 13. Within the blunted calices the presumed clot as outlined by excreted contrast.  There calcifications near the distal ureters but not within the distal ureters them cells. A separate right renal calculus is nonobstructive and measures 3 mm in diameter on image 34 series 2.  Which like the  contralateral side, there is a staghorn calculus in the left mid kidney collecting system abutting the UPJ, measuring 1.7 cm in long axis. Release 5 additional nonobstructive renal calculi are present in the left kidney. There is no overt left hydronephrosis nor do we demonstrate additional casting filling defects in the left kidney. There is some mild left caliectasis particularly in upper pole example on image 32 series 13.  There is abnormal wall thickening in the right proximal ureter extending along 4.3 cm from the stone, possibly  from inflammatory wall thickening, less likely to be from thrombus or tumor. Similar appearance on the left extending along 3.4 cm distal to the collecting system calculus in the left proximal ureter with only a thin channel of contrast in this vicinity.  Urinary bladder unremarkable.  Stomach/Bowel: Hiatal hernia.  Sigmoid colon diverticulosis.  Vascular/Lymphatic: Atherosclerosis is present, including aortoiliac atherosclerotic disease and atherosclerotic calcifications in branches of the abdominal aorta.  Reproductive: Uterus absent.  Adnexa unremarkable.  Other: No supplemental non-categorized findings.  Musculoskeletal: Thoracic and lumbar spondylosis and degenerative disc disease.  IMPRESSION: 1. Bilateral staghorn calculi in the collecting systems extending to the UPJ on both sides. These are associated with abnormal thickening of the proximal ureters bilaterally, as well as mild bilateral hydronephrosis. 2. In the right renal collecting system, the large staghorn calculus is associated with abnormal noncalcified filling defect in anterior infundibula and calices probably from blood clot given the fact that this abnormal density filling the infundibulum and calices is not appreciably enhancing. Difficult to exclude small amounts of tumor in this region given the substantially abnormal appearance. See for example image 37 series 13. 3.  Renal atrophy and scarring with renal sinus lipomatosis. 4. Additional nonobstructive renal calculi are present bilaterally. 5. Stable 4 by 3 mm pleural-based nodule in the left lower lobe, not changed over the last year, thought to likely be benign. 6. Mild bibasilar scarring. 7. Other imaging findings of potential clinical significance: Small to moderate-sized hiatal hernia. Mild cardiomegaly. Sigmoid colon diverticulosis. Aortic Atherosclerosis (ICD10-I70.0). Systemic atherosclerosis. Thoracolumbar spondylosis and degenerative disc disease.   Electronically Signed By: Van Clines M.D. On: 05/23/2021 14:17  No results found for this or any previous visit.   Assessment & Plan:    1. Nephrolithiasis -We discussed the management of kidney stones. These options include observation, ureteroscopy, shockwave lithotripsy (ESWL) and percutaneous nephrolithotomy (PCNL). We discussed which options are relevant to the patient's stone(s). We discussed the natural history of kidney stones as well as the complications of untreated stones and the impact on quality of life without treatment as well as with each of the above listed treatments. We also discussed the efficacy of each treatment in its ability to clear the stone burden. With any of these management options I discussed the signs and symptoms of infection and the need for emergent treatment should these be experienced. For each option we discussed the ability of each procedure to clear the patient of their stone burden.   For observation I described the risks which include but are not limited to silent renal damage, life-threatening infection, need for emergent surgery, failure to pass stone and pain.   For ureteroscopy I described the risks which include bleeding, infection, damage to contiguous structures, positioning injury, ureteral stricture, ureteral avulsion, ureteral injury, need for prolonged ureteral stent, inability to perform  ureteroscopy, need for an interval procedure, inability to clear stone burden, stent discomfort/pain, heart attack, stroke, pulmonary embolus and the inherent risks with general anesthesia.   For shockwave lithotripsy I described the risks which include arrhythmia, kidney contusion, kidney hemorrhage, need for transfusion, pain, inability to adequately break up stone, inability to pass stone fragments, Steinstrasse, infection associated with obstructing stones, need for alternate surgical procedure, need for repeat shockwave lithotripsy, MI, CVA, PE and the inherent risks with anesthesia/conscious sedation.   For PCNL I described the risks including positioning injury, pneumothorax, hydrothorax, need for chest tube, inability to clear stone burden, renal laceration, arterial venous fistula or malformation, need for embolization  of kidney, loss of kidney or renal function, need for repeat procedure, need for prolonged nephrostomy tube, ureteral avulsion, MI, CVA, PE and the inherent risks of general anesthesia.   - The patient would like to proceed with staged ESWL and ureteroscopy  2. Urinary incontinence, unspecified type -Continue vesicare 5mg  daily. If the vesicare fails to improve her OAB symtpoms we wilkl proceed with mirabegron/gemtesa   No follow-ups on file.  Nicolette Bang, MD  [facility] [place]

## 2021-05-27 NOTE — Progress Notes (Signed)
05/27/2021 10:53 AM   Christella Noa [birth year] [id number]  Referring provider: Sharion Balloon, [address] [place],  [place] [address]  nephrolithiasis  HPI: Ms Mctighe is a 76EL here for evaluation of nephrolithiasis. She developed new onset urge incontinence and gross hematuria 3-4 weeks ago. She presented to our office and underwent CT hematuria protocol which showed bilateral large renal calculi.  The hematuria has since resolved. She has a 2cm right UPJ calculus and a left 1.7cm renal pelvis calculus. She has chronic bilateral back/flank pain. Urine culture 2/8 was negative.    PMH: Past Medical History:  Diagnosis Date   Asthma    Chronic systolic heart failure ([place])    Echo (08/26/13):  Mild LVH. EF 25% to 30%. Diffuse HK. Aortic valve: There was trivial regurgitation. Mitral valve: There was mild regurgitation. Left atrium: The atrium was mildly dilated.   GERD (gastroesophageal reflux disease)    Headache(784.0)    History of nephrolithiasis    Hypertension    NICM (nonischemic cardiomyopathy) ([place])    a. LHC (08/2013):  no CAD   Sleep apnea    does not use machine   Stroke (HCC)    Type II or unspecified type diabetes mellitus without mention of complication, uncontrolled    borderline    Surgical History: Past Surgical History:  Procedure Laterality Date   ABDOMINAL HYSTERECTOMY     COLONOSCOPY WITH PROPOFOL N/A 07/01/2019   Procedure: COLONOSCOPY WITH PROPOFOL;  Surgeon: Danie Binder, MD;  Location: [facility];  Service: Endoscopy;  Laterality: N/A;  2:15pm - pt will not move up   LEFT AND RIGHT HEART CATHETERIZATION WITH CORONARY ANGIOGRAM N/A 09/03/2013   Procedure: LEFT AND RIGHT HEART CATHETERIZATION WITH CORONARY ANGIOGRAM;  Surgeon: [name], MD;  Location: [facility] CATH LAB;  Service: Cardiovascular;  Laterality: N/A;   OOPHORECTOMY     removed 1 ovary and 1 ovary remains.   POLYPECTOMY  07/01/2019   Procedure: POLYPECTOMY;   Surgeon: Danie Binder, MD;  Location: [facility];  Service: Endoscopy;;   ureteral extraction of kidney stone      Home Medications:  Allergies as of 05/27/2021       Reactions   Levaquin [levofloxacin In D5w] Diarrhea   Penicillins Rash        Medication List        Accurate as of May 27, 2021 10:53 AM. If you have any questions, ask your nurse or doctor.          albuterol 108 (90 Base) MCG/ACT inhaler Commonly known as: VENTOLIN HFA Inhale 2 puffs into the lungs every 6 (six) hours as needed for wheezing or shortness of breath.   alendronate 70 MG tablet Commonly known as: Fosamax Take 1 tablet (70 mg total) by mouth every 7 (seven) days. Take with a full glass of water on an empty stomach.   amLODipine 10 MG tablet Commonly known as: NORVASC Take 1 tablet (10 mg total) by mouth daily.   bisoprolol 5 MG tablet Commonly known as: ZEBETA Take 1 tablet (5 mg total) by mouth daily.   diclofenac 75 MG EC tablet Commonly known as: VOLTAREN Take 1 tablet (75 mg total) by mouth 2 (two) times daily.   diclofenac Sodium 1 % Gel Commonly known as: Voltaren Apply 2 g topically 4 (four) times daily.   Entresto 97-103 MG Generic drug: sacubitril-valsartan Take 1 tablet by mouth twice daily   glucose blood test strip  Commonly known as: Printmaker as instructed   OneTouch Delica Lancets Fine Misc Use to check BG once daily   oxybutynin 10 MG 24 hr tablet Commonly known as: Ditropan XL Take 1 tablet (10 mg total) by mouth daily.   rosuvastatin 10 MG tablet Commonly known as: Crestor Take 1 tablet (10 mg total) by mouth daily.   solifenacin 5 MG tablet Commonly known as: VESICARE Take 1 tablet (5 mg total) by mouth daily.   Vitamin D (Ergocalciferol) 1.25 MG (50000 UNIT) Caps capsule Commonly known as: DRISDOL Take 1 capsule (50,000 Units total) by mouth every 7 (seven) days.        Allergies:  Allergies  Allergen Reactions    Levaquin [Levofloxacin In D5w] Diarrhea   Penicillins Rash    Family History: Family History  Problem Relation Age of Onset   Hypertension Mother    Arthritis Mother    CAD Mother 3   Hip fracture Mother 69   COPD Father    Crohn's disease Sister    Cancer Sister        lung   Early death Brother 40       house fire   Diabetes Maternal Grandmother    Hypertension Sister    Hypertension Brother    Colon cancer Neg Hx     Social History:  reports that she has never smoked. She has never used smokeless tobacco. She reports current alcohol use. She reports that she does not use drugs.  ROS: All other review of systems were reviewed and are negative except what is noted above in HPI  Physical Exam: BP (!) 198/84    Pulse (!) 52    Wt 157 lb (71.2 kg)    LMP 01/22/1981 (LMP Unknown)    BMI 26.95 kg/m   Constitutional:  Alert and oriented, No acute distress. HEENT: [place] AT, moist mucus membranes.  Trachea midline, no masses. Cardiovascular: No clubbing, cyanosis, or edema. Respiratory: Normal respiratory effort, no increased work of breathing. GI: Abdomen is soft, nontender, nondistended, no abdominal masses GU: No CVA tenderness.  Lymph: No cervical or inguinal lymphadenopathy. Skin: No rashes, bruises or suspicious lesions. Neurologic: Grossly intact, no focal deficits, moving all 4 extremities. Psychiatric: Normal mood and affect.  Laboratory Data: Lab Results  Component Value Date   WBC 8.2 05/05/2021   HGB 14.5 05/05/2021   HCT 43.6 05/05/2021   MCV 87 05/05/2021   PLT 268 05/05/2021    Lab Results  Component Value Date   CREATININE 0.97 05/05/2021    No results found for: PSA  No results found for: TESTOSTERONE  Lab Results  Component Value Date   HGBA1C 7.1 (H) 05/05/2021    Urinalysis    Component Value Date/Time   COLORURINE YELLOW 12/16/2013 1120   APPEARANCEUR Clear 05/18/2021 1511   LABSPEC 1.013 12/16/2013 1120   PHURINE 6.5 12/16/2013 1120    GLUCOSEU Negative 05/18/2021 1511   HGBUR MODERATE (A) 12/16/2013 1120   BILIRUBINUR Negative 05/18/2021 1511   KETONESUR NEGATIVE 12/16/2013 1120   PROTEINUR 2+ (A) 05/18/2021 1511   PROTEINUR 30 (A) 12/16/2013 1120   UROBILINOGEN negative 07/24/2014 1519   UROBILINOGEN 0.2 12/16/2013 1120   NITRITE Negative 05/18/2021 1511   NITRITE NEGATIVE 12/16/2013 1120   LEUKOCYTESUR 2+ (A) 05/18/2021 1511    Lab Results  Component Value Date   LABMICR See below: 05/18/2021   WBCUA 11-30 (A) 05/18/2021   LABEPIT 0-10 05/18/2021   MUCUS Present 05/18/2021  BACTERIA Many (A) 05/18/2021    Pertinent Imaging: CT hematuria 05/23/2021: Images reviewed and discussed with the patient  Results for orders placed in visit on 11/13/12  DG Abd 1 View  Narrative *RADIOLOGY REPORT*  Clinical Data: 76 year old female with abdominal pain.  ABDOMEN - 1 VIEW  Comparison: CT abdomen and pelvis 10/04/2001.  Findings: Multiple bilateral renal calculi, bulky centrally and now measuring up to 18-19 mm in size bilaterally.  These have progressed since 2003.  Moderate dextroconvex lumbar scoliosis appears mildly progressed. Visualized bowel gas pattern is nonobstructed.  No acute osseous abnormality identified.  IMPRESSION: Progressed bilateral nephrolithiasis since 2003.  Nonobstructed bowel gas pattern.  Clinically significant discrepancy from primary report, if provided: None   Original Report Authenticated By: Roselyn Reef, M.D.  No results found for this or any previous visit.  No results found for this or any previous visit.  No results found for this or any previous visit.  No results found for this or any previous visit.  No results found for this or any previous visit.  Results for orders placed in visit on 05/18/21  CT HEMATURIA WORKUP  Narrative CLINICAL DATA:  Hematuria over the last 3 weeks. History of diabetes and hypertension. Cystitis. Urinary  incontinence.  EXAM: CT ABDOMEN AND PELVIS WITHOUT AND WITH CONTRAST  TECHNIQUE: Multidetector CT imaging of the abdomen and pelvis was performed following the standard protocol before and following the bolus administration of intravenous contrast.  RADIATION DOSE REDUCTION: This exam was performed according to the departmental dose-optimization program which includes automated exposure control, adjustment of the mA and/or kV according to patient size and/or use of iterative reconstruction technique.  CONTRAST:  154mL OMNIPAQUE IOHEXOL 300 MG/ML  SOLN  COMPARISON:  CT abdomen 10/04/2001  FINDINGS: Lower chest: Mild scarring in the right middle lobe, right lower lobe, and lingula. 4 by 3 mm pleural-based nodule along the left lower lobe on image 61 series 15, not changed from 05/11/2020. Small to moderate-sized hiatal hernia. Mild cardiomegaly. Descending thoracic aortic atherosclerosis.  Hepatobiliary: Contracted gallbladder.  Otherwise unremarkable.  Pancreas: Unremarkable  Spleen: Unremarkable  Adrenals/Urinary Tract: Both adrenal glands appear normal.  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 A branching 2.2 by 1.7 cm collecting system staghorn calculus in the right renal pelvis is associated with lower pole and mid kidney caliectasis  2.5 by 1.8 cm filling defect in the right mid to lower kidney infundibulum and calyx proximal to the stone on image 56 series 16 does not appear to enhance and accordingly favors plan thrombus over tumor. Similar filling defect is observed in much of the anterior kidney collecting system as shown for example on image 37 series 13. Within the blunted calices the presumed clot as outlined by excreted contrast.  There calcifications near the distal ureters but not within the distal ureters them cells. A separate right renal calculus is nonobstructive and measures 3 mm in diameter on image 34 series 2.  Which like the  contralateral side, there is a staghorn calculus in the left mid kidney collecting system abutting the UPJ, measuring 1.7 cm in long axis. Release 5 additional nonobstructive renal calculi are present in the left kidney. There is no overt left hydronephrosis nor do we demonstrate additional casting filling defects in the left kidney. There is some mild left caliectasis particularly in upper pole example on image 32 series 13.  There is abnormal wall thickening in the right proximal ureter extending along 4.3 cm from the stone, possibly  from inflammatory wall thickening, less likely to be from thrombus or tumor. Similar appearance on the left extending along 3.4 cm distal to the collecting system calculus in the left proximal ureter with only a thin channel of contrast in this vicinity.  Urinary bladder unremarkable.  Stomach/Bowel: Hiatal hernia.  Sigmoid colon diverticulosis.  Vascular/Lymphatic: Atherosclerosis is present, including aortoiliac atherosclerotic disease and atherosclerotic calcifications in branches of the abdominal aorta.  Reproductive: Uterus absent.  Adnexa unremarkable.  Other: No supplemental non-categorized findings.  Musculoskeletal: Thoracic and lumbar spondylosis and degenerative disc disease.  IMPRESSION: 1. Bilateral staghorn calculi in the collecting systems extending to the UPJ on both sides. These are associated with abnormal thickening of the proximal ureters bilaterally, as well as mild bilateral hydronephrosis. 2. In the right renal collecting system, the large staghorn calculus is associated with abnormal noncalcified filling defect in anterior infundibula and calices probably from blood clot given the fact that this abnormal density filling the infundibulum and calices is not appreciably enhancing. Difficult to exclude small amounts of tumor in this region given the substantially abnormal appearance. See for example image 37 series 13. 3.  Renal atrophy and scarring with renal sinus lipomatosis. 4. Additional nonobstructive renal calculi are present bilaterally. 5. Stable 4 by 3 mm pleural-based nodule in the left lower lobe, not changed over the last year, thought to likely be benign. 6. Mild bibasilar scarring. 7. Other imaging findings of potential clinical significance: Small to moderate-sized hiatal hernia. Mild cardiomegaly. Sigmoid colon diverticulosis. Aortic Atherosclerosis (ICD10-I70.0). Systemic atherosclerosis. Thoracolumbar spondylosis and degenerative disc disease.   Electronically Signed By: [place] Clines M.D. On: 05/23/2021 14:17  No results found for this or any previous visit.   Assessment & Plan:    1. Nephrolithiasis -We discussed the management of kidney stones. These options include observation, ureteroscopy, shockwave lithotripsy (ESWL) and percutaneous nephrolithotomy (PCNL). We discussed which options are relevant to the patient's stone(s). We discussed the natural history of kidney stones as well as the complications of untreated stones and the impact on quality of life without treatment as well as with each of the above listed treatments. We also discussed the efficacy of each treatment in its ability to clear the stone burden. With any of these management options I discussed the signs and symptoms of infection and the need for emergent treatment should these be experienced. For each option we discussed the ability of each procedure to clear the patient of their stone burden.   For observation I described the risks which include but are not limited to silent renal damage, life-threatening infection, need for emergent surgery, failure to pass stone and pain.   For ureteroscopy I described the risks which include bleeding, infection, damage to contiguous structures, positioning injury, ureteral stricture, ureteral avulsion, ureteral injury, need for prolonged ureteral stent, inability to perform  ureteroscopy, need for an interval procedure, inability to clear stone burden, stent discomfort/pain, heart attack, stroke, pulmonary embolus and the inherent risks with general anesthesia.   For shockwave lithotripsy I described the risks which include arrhythmia, kidney contusion, kidney hemorrhage, need for transfusion, pain, inability to adequately break up stone, inability to pass stone fragments, Steinstrasse, infection associated with obstructing stones, need for alternate surgical procedure, need for repeat shockwave lithotripsy, MI, CVA, PE and the inherent risks with anesthesia/conscious sedation.   For PCNL I described the risks including positioning injury, pneumothorax, hydrothorax, need for chest tube, inability to clear stone burden, renal laceration, arterial venous fistula or malformation, need for embolization  of kidney, loss of kidney or renal function, need for repeat procedure, need for prolonged nephrostomy tube, ureteral avulsion, MI, CVA, PE and the inherent risks of general anesthesia.   - The patient would like to proceed with staged ESWL and ureteroscopy  2. Urinary incontinence, unspecified type -Continue vesicare 5mg  daily. If the vesicare fails to improve her OAB symtpoms we wilkl proceed with mirabegron/gemtesa   No follow-ups on file.  Nicolette Bang, MD  [facility]

## 2021-05-27 NOTE — Patient Instructions (Signed)
Dear Sunday Spillers,   Thank you for choosing [facility] [place] to assist in your urologic care for your upcoming surgery. The following information below includes specific dates and details related to surgery:   The Surgical Procedure you are scheduled to have performed is Left extracorporeal shockwave lithotripsy   Surgery Date: 05/31/2021   Physician performing the surgery: Nicolette Bang   Do not eat or drink after midnight the day before your surgery.    You will need a driver the day of surgery and will not be able to operate heavy machinery for 24 hours after.    Your surgery will be performed at  [facility] [address], [place] [address]   Enter at the Main Entrance and check in at the [place] desk.     Pre-Admit Testing Info   Pre- Admit appointments are interview with an anesthesiologist or a pre-operative anesthesia nurse. These appointments are typically completed as an in person visit but can take place over the telephone.  You will be contacted to confirm the date and time window.    If you have any questions or concerns, please don't hesitate to call the office at [phone]   Thank you,    Gibson Ramp, RN Clinical Surgery Coordinator [facility]

## 2021-05-30 ENCOUNTER — Encounter (HOSPITAL_COMMUNITY)
Admission: RE | Admit: 2021-05-30 | Discharge: 2021-05-30 | Disposition: A | Discharge status: Home / Self Care | Payer: Medicare HMO | Source: Ambulatory Visit | Attending: Urology | Admitting: Urology

## 2021-05-30 ENCOUNTER — Telehealth: Payer: Self-pay | Admitting: Family

## 2021-05-30 ENCOUNTER — Encounter (HOSPITAL_COMMUNITY): Payer: Self-pay

## 2021-05-31 ENCOUNTER — Encounter (HOSPITAL_COMMUNITY)
Admission: RE | Disposition: A | Discharge status: Home / Self Care | Payer: Self-pay | Source: Home / Self Care | Attending: Urology

## 2021-05-31 ENCOUNTER — Encounter (HOSPITAL_COMMUNITY): Payer: Self-pay | Admitting: Urology

## 2021-05-31 ENCOUNTER — Ambulatory Visit (HOSPITAL_COMMUNITY): Payer: Medicare HMO

## 2021-05-31 ENCOUNTER — Ambulatory Visit (HOSPITAL_COMMUNITY)
Admission: RE | Admit: 2021-05-31 | Discharge: 2021-05-31 | Disposition: A | Discharge status: Home / Self Care | Payer: Medicare HMO | Attending: Urology | Admitting: Urology

## 2021-05-31 ENCOUNTER — Other Ambulatory Visit: Payer: Self-pay

## 2021-05-31 ENCOUNTER — Ambulatory Visit: Payer: Medicare HMO | Admitting: Family

## 2021-05-31 ENCOUNTER — Encounter: Payer: Self-pay | Admitting: Urology

## 2021-05-31 DIAGNOSIS — E119 Type 2 diabetes mellitus without complications: Secondary | ICD-10-CM | POA: Diagnosis not present

## 2021-05-31 DIAGNOSIS — Z8673 Personal history of transient ischemic attack (TIA), and cerebral infarction without residual deficits: Secondary | ICD-10-CM | POA: Insufficient documentation

## 2021-05-31 DIAGNOSIS — N200 Calculus of kidney: Secondary | ICD-10-CM | POA: Diagnosis not present

## 2021-05-31 DIAGNOSIS — M419 Scoliosis, unspecified: Secondary | ICD-10-CM | POA: Diagnosis not present

## 2021-05-31 DIAGNOSIS — I509 Heart failure, unspecified: Secondary | ICD-10-CM | POA: Diagnosis not present

## 2021-05-31 DIAGNOSIS — I110 Hypertensive heart disease with heart failure: Secondary | ICD-10-CM | POA: Insufficient documentation

## 2021-05-31 DIAGNOSIS — G4730 Sleep apnea, unspecified: Secondary | ICD-10-CM | POA: Insufficient documentation

## 2021-05-31 HISTORY — PX: EXTRACORPOREAL SHOCK WAVE LITHOTRIPSY: SHX1557

## 2021-05-31 LAB — GLUCOSE, CAPILLARY: Glucose-Capillary: 176 mg/dL — ABNORMAL HIGH (ref 70–99)

## 2021-05-31 SURGERY — LITHOTRIPSY, ESWL
Anesthesia: LOCAL | Laterality: Left

## 2021-05-31 MED ORDER — DIAZEPAM 5 MG PO TABS
10.0000 mg | ORAL_TABLET | Freq: Once | ORAL | Status: DC
  Filled 2021-05-31: qty 2

## 2021-05-31 MED ORDER — SODIUM CHLORIDE 0.9 % IV SOLN
INTRAVENOUS | Status: DC
Start: 2021-05-31 — End: 2021-05-31

## 2021-05-31 MED ORDER — DIPHENHYDRAMINE HCL 25 MG PO CAPS
25.0000 mg | ORAL_CAPSULE | ORAL | Status: DC
  Filled 2021-05-31: qty 1

## 2021-05-31 MED ORDER — TAMSULOSIN HCL 0.4 MG PO CAPS: 0.4000 mg | ORAL_CAPSULE | Freq: Every day | ORAL | 1 refills | Status: DC

## 2021-05-31 MED ORDER — HYDROCODONE-ACETAMINOPHEN 5-325 MG PO TABS: 1.0000 | ORAL_TABLET | Freq: Four times a day (QID) | ORAL | 0 refills | Status: DC | PRN

## 2021-05-31 NOTE — Interval H&P Note (Signed)
History and Physical Interval Note:  05/31/2021 11:39 AM  [name]  has presented today for surgery, with the diagnosis of left renal calculus.  The various methods of treatment have been discussed with the patient and family. After consideration of risks, benefits and other options for treatment, the patient has consented to  Procedure(s): EXTRACORPOREAL SHOCK WAVE LITHOTRIPSY (ESWL) (Left) as a surgical intervention.  The patient's history has been reviewed, patient examined, no change in status, stable for surgery.  I have reviewed the patient's chart and labs.  Questions were answered to the patient's satisfaction.     Nicolette Bang

## 2021-05-31 NOTE — Telephone Encounter (Signed)
ok 

## 2021-06-01 ENCOUNTER — Encounter (HOSPITAL_COMMUNITY): Payer: Self-pay | Admitting: Urology

## 2021-06-09 ENCOUNTER — Ambulatory Visit (INDEPENDENT_AMBULATORY_CARE_PROVIDER_SITE_OTHER): Payer: Medicare HMO | Admitting: Family

## 2021-06-09 ENCOUNTER — Encounter: Payer: Self-pay | Admitting: Family

## 2021-06-09 VITALS — BP 168/60 | HR 62 | Temp 96.0°F | Ht 64.0 in | Wt 164.0 lb

## 2021-06-09 DIAGNOSIS — R32 Unspecified urinary incontinence: Secondary | ICD-10-CM

## 2021-06-09 DIAGNOSIS — Z87442 Personal history of urinary calculi: Secondary | ICD-10-CM | POA: Diagnosis not present

## 2021-06-09 DIAGNOSIS — I10 Essential (primary) hypertension: Secondary | ICD-10-CM

## 2021-06-09 MED ORDER — BLOOD PRESSURE KIT DEVI: 1.0000 | Freq: Two times a day (BID) | 0 refills | Status: DC

## 2021-06-09 MED ORDER — BISOPROLOL FUMARATE 10 MG PO TABS: 10.0000 mg | ORAL_TABLET | Freq: Every day | ORAL | 1 refills | Status: DC

## 2021-06-09 NOTE — Patient Instructions (Signed)

## 2021-06-09 NOTE — Progress Notes (Signed)
? ?Subjective:  ? ? Patient ID: SHANNIE KONTOS, female    DOB: 03/29/1946, 76 y.o.   MRN: [id number] ? ?Chief Complaint  ?Patient presents with  ? Follow-up  ? ?Pt presets to the office today for chronic follow up. Has hx of CVA approx 2011.   ?  ?She is also complaining of urinary incontinence. She saw Urologists who ordered a CT scan and showed kidney stones. She had  Lithotripsy on 05/31/21 and was given flomax. She is taking oxybutynin 10 mg daily without relief. She has a follow up with Urologists on 06/17/21. ?Hypertension ?This is a chronic problem. The current episode started more than 1 year ago. The problem has been waxing and waning since onset. The problem is uncontrolled. Associated symptoms include malaise/fatigue and peripheral edema (slightly). Pertinent negatives include no shortness of breath. Risk factors for coronary artery disease include dyslipidemia and sedentary lifestyle. Past treatments include calcium channel blockers and beta blockers. The current treatment provides mild improvement.  ? ? ? ?Review of Systems  ?Constitutional:  Positive for malaise/fatigue.  ?Respiratory:  Negative for shortness of breath.   ?All other systems reviewed and are negative. ? ?   ?Objective:  ? Physical Exam ?Vitals reviewed.  ?Constitutional:   ?   General: She is not in acute distress. ?   Appearance: She is well-developed. She is obese.  ?HENT:  ?   Head: Normocephalic and atraumatic.  ?   Right Ear: Tympanic membrane normal.  ?   Left Ear: Tympanic membrane normal.  ?Eyes:  ?   Pupils: Pupils are equal, round, and reactive to light.  ?Neck:  ?   Thyroid: No thyromegaly.  ?Cardiovascular:  ?   Rate and Rhythm: Normal rate and regular rhythm.  ?   Heart sounds: Normal heart sounds. No murmur heard. ?Pulmonary:  ?   Effort: Pulmonary effort is normal. No respiratory distress.  ?   Breath sounds: Normal breath sounds. No wheezing.  ?Abdominal:  ?   General: Bowel sounds are normal. There is no distension.  ?    Palpations: Abdomen is soft.  ?   Tenderness: There is no abdominal tenderness.  ?Musculoskeletal:     ?   General: No tenderness. Normal range of motion.  ?   Cervical back: Normal range of motion and neck supple.  ?   Right lower leg: Edema (trace) present.  ?   Left lower leg: Edema (trace) present.  ?Skin: ?   General: Skin is warm and dry.  ?Neurological:  ?   Mental Status: She is alert and oriented to person, place, and time.  ?   Cranial Nerves: No cranial nerve deficit.  ?   Deep Tendon Reflexes: Reflexes are normal and symmetric.  ?Psychiatric:     ?   Behavior: Behavior normal.     ?   Thought Content: Thought content normal.     ?   Judgment: Judgment normal.  ? ? ? ? ?BP (!) 168/60   Pulse 62   Temp (!) 96 ?F (35.6 ?C) (Temporal)   Ht 5' 4"  (1.626 m)   Wt 164 lb (74.4 kg)   LMP 01/22/1981 (LMP Unknown)   BMI 28.15 kg/m?  ? ?   ?Assessment & Plan:  ?LESSLY STIGLER comes in today with chief complaint of Follow-up ? ? ?Diagnosis and orders addressed: ? ?1. Essential hypertension ?Will increase bisoprolol to 10 mg from 5 mg ?-Dash diet information given ?-Exercise encouraged ?- Stress Management  ?-  Continue current meds ?-RTO in 2-4 weeks  ?- bisoprolol (ZEBETA) 10 MG tablet; Take 1 tablet (10 mg total) by mouth daily.  Dispense: 90 tablet; Refill: 1 ?- CMP14+EGFR ?- Blood Pressure Monitoring (BLOOD PRESSURE KIT) DEVI; 1 Device by Does not apply route 2 (two) times daily.  Dispense: 1 each; Refill: 0 ? ?2. NEPHROLITHIASIS, HX OF ?Keep appt with Urologists ?Force fluids ?Continue Flomax ?- CMP14+EGFR ? ?3. Urinary incontinence, unspecified type ?Continue oxybutynin  ?- CMP14+EGFR ? ? ?Labs pending ?Health Maintenance reviewed ?Diet and exercise encouraged ? ?Follow up plan: ?2-4 weeks to recheck HTN ? ?Evelina Dun, FNP ? ? ? ?

## 2021-06-10 ENCOUNTER — Other Ambulatory Visit: Payer: Medicare HMO | Admitting: Urology

## 2021-06-17 ENCOUNTER — Ambulatory Visit: Payer: Medicare HMO | Admitting: Physician Assistant

## 2021-06-20 ENCOUNTER — Ambulatory Visit (HOSPITAL_COMMUNITY)
Admission: RE | Admit: 2021-06-20 | Discharge: 2021-06-20 | Disposition: A | Discharge status: Home / Self Care | Payer: Medicare HMO | Source: Ambulatory Visit | Attending: Urology | Admitting: Urology

## 2021-06-20 ENCOUNTER — Ambulatory Visit (INDEPENDENT_AMBULATORY_CARE_PROVIDER_SITE_OTHER): Payer: Medicare HMO

## 2021-06-20 ENCOUNTER — Other Ambulatory Visit: Payer: Self-pay

## 2021-06-20 ENCOUNTER — Ambulatory Visit (INDEPENDENT_AMBULATORY_CARE_PROVIDER_SITE_OTHER): Payer: Medicare HMO | Admitting: Physician Assistant

## 2021-06-20 VITALS — Ht 64.0 in | Wt 151.0 lb

## 2021-06-20 VITALS — BP 160/88 | HR 112 | Ht 64.0 in | Wt 151.0 lb

## 2021-06-20 DIAGNOSIS — N200 Calculus of kidney: Secondary | ICD-10-CM

## 2021-06-20 DIAGNOSIS — Z596 Low income: Secondary | ICD-10-CM

## 2021-06-20 DIAGNOSIS — Z1231 Encounter for screening mammogram for malignant neoplasm of breast: Secondary | ICD-10-CM

## 2021-06-20 DIAGNOSIS — Z59868 Other specified financial insecurity: Secondary | ICD-10-CM

## 2021-06-20 DIAGNOSIS — Z0000 Encounter for general adult medical examination without abnormal findings: Secondary | ICD-10-CM

## 2021-06-20 DIAGNOSIS — N3000 Acute cystitis without hematuria: Secondary | ICD-10-CM | POA: Diagnosis not present

## 2021-06-20 DIAGNOSIS — R32 Unspecified urinary incontinence: Secondary | ICD-10-CM | POA: Diagnosis not present

## 2021-06-20 LAB — URINALYSIS, ROUTINE W REFLEX MICROSCOPIC
Bilirubin, UA: NEGATIVE
Glucose, UA: NEGATIVE
Nitrite, UA: NEGATIVE
Specific Gravity, UA: 1.025 (ref 1.005–1.030)
Urobilinogen, Ur: 0.2 mg/dL (ref 0.2–1.0)
pH, UA: 5.5 (ref 5.0–7.5)

## 2021-06-20 LAB — MICROSCOPIC EXAMINATION
Renal Epithel, UA: NONE SEEN /hpf
WBC, UA: >30 /hpf — AB (ref 0–5)

## 2021-06-20 MED ORDER — SULFAMETHOXAZOLE-TRIMETHOPRIM 800-160 MG PO TABS: 1.0000 | ORAL_TABLET | Freq: Two times a day (BID) | ORAL | 0 refills | Status: DC

## 2021-06-20 NOTE — Patient Instructions (Signed)
[name] , Thank you for taking time to come for your Medicare Wellness Visit. I appreciate your ongoing commitment to your health goals. Please review the following plan we discussed and let me know if I can assist you in the future.   Screening recommendations/referrals: Colonoscopy: Done 07/01/2019 Repeat no longer required. Mammogram: Done 01/07/2018. Order placed today for mammogram bus. Bone Density: Done 05/05/2021. Repeat every 2 years  Recommended yearly ophthalmology/optometry visit for glaucoma screening and checkup Recommended yearly dental visit for hygiene and checkup  Vaccinations: Influenza vaccine: Done 02/07/2021 Repeat annually  Pneumococcal vaccine: Done 02/27/2014 and 04/25/2010 Tdap vaccine: Done 12/23/2019 Repeat in 10 years  Shingles vaccine: Discussed.   Covid-19:Done 06/08/2019, 07/08/2019, 03/16/2020 and 03/08/2021.  Advanced directives: Advance directive discussed with you today. Even though you declined this today, please call our office should you change your mind, and we can give you the proper paperwork for you to fill out.   Conditions/risks identified: Aim for 30 minutes of exercise or brisk walking, 6-8 glasses of water, and 5 servings of fruits and vegetables each day.   Next appointment: Follow up in one year for your annual wellness visit 2024.   Preventive Care 30 Years and Older, Female Preventive care refers to lifestyle choices and visits with your health care provider that can promote health and wellness. What does preventive care include? A yearly physical exam. This is also called an annual well check. Dental exams once or twice a year. Routine eye exams. Ask your health care provider how often you should have your eyes checked. Personal lifestyle choices, including: Daily care of your teeth and gums. Regular physical activity. Eating a healthy diet. Avoiding tobacco and drug use. Limiting alcohol use. Practicing safe sex. Taking  low-dose aspirin every day. Taking vitamin and mineral supplements as recommended by your health care provider. What happens during an annual well check? The services and screenings done by your health care provider during your annual well check will depend on your age, overall health, lifestyle risk factors, and family history of disease. Counseling  Your health care provider may ask you questions about your: Alcohol use. Tobacco use. Drug use. Emotional well-being. Home and relationship well-being. Sexual activity. Eating habits. History of falls. Memory and ability to understand (cognition). Work and work Statistician. Reproductive health. Screening  You may have the following tests or measurements: Height, weight, and BMI. Blood pressure. Lipid and cholesterol levels. These may be checked every 5 years, or more frequently if you are over 34 years old. Skin check. Lung cancer screening. You may have this screening every year starting at age 29 if you have a 30-pack-year history of smoking and currently smoke or have quit within the past 15 years. Fecal occult blood test (FOBT) of the stool. You may have this test every year starting at age 74. Flexible sigmoidoscopy or colonoscopy. You may have a sigmoidoscopy every 5 years or a colonoscopy every 10 years starting at age 87. Hepatitis C blood test. Hepatitis B blood test. Sexually transmitted disease (STD) testing. Diabetes screening. This is done by checking your blood sugar (glucose) after you have not eaten for a while (fasting). You may have this done every 1-3 years. Bone density scan. This is done to screen for osteoporosis. You may have this done starting at age 55. Mammogram. This may be done every 1-2 years. Talk to your health care provider about how often you should have regular mammograms. Talk with your health care provider about your  test results, treatment options, and if necessary, the need for more tests. Vaccines   Your health care provider may recommend certain vaccines, such as: Influenza vaccine. This is recommended every year. Tetanus, diphtheria, and acellular pertussis (Tdap, Td) vaccine. You may need a Td booster every 10 years. Zoster vaccine. You may need this after age 64. Pneumococcal 13-valent conjugate (PCV13) vaccine. One dose is recommended after age 38. Pneumococcal polysaccharide (PPSV23) vaccine. One dose is recommended after age 36. Talk to your health care provider about which screenings and vaccines you need and how often you need them. This information is not intended to replace advice given to you by your health care provider. Make sure you discuss any questions you have with your health care provider. Document Released: 04/23/2015 Document Revised: 12/15/2015 Document Reviewed: 01/26/2015 Elsevier Interactive Patient Education  2017 [place] Prevention in the Home Falls can cause injuries. They can happen to people of all ages. There are many things you can do to make your home safe and to help prevent falls. What can I do on the outside of my home? Regularly fix the edges of walkways and driveways and fix any cracks. Remove anything that might make you trip as you walk through a door, such as a raised step or threshold. Trim any bushes or trees on the path to your home. Use bright outdoor lighting. Clear any walking paths of anything that might make someone trip, such as rocks or tools. Regularly check to see if handrails are loose or broken. Make sure that both sides of any steps have handrails. Any raised decks and porches should have guardrails on the edges. Have any leaves, snow, or ice cleared regularly. Use sand or salt on walking paths during winter. Clean up any spills in your garage right away. This includes oil or grease spills. What can I do in the bathroom? Use night lights. Install grab bars by the toilet and in the tub and shower. Do not use towel  bars as grab bars. Use non-skid mats or decals in the tub or shower. If you need to sit down in the shower, use a plastic, non-slip stool. Keep the floor dry. Clean up any water that spills on the floor as soon as it happens. Remove soap buildup in the tub or shower regularly. Attach bath mats securely with double-sided non-slip rug tape. Do not have throw rugs and other things on the floor that can make you trip. What can I do in the bedroom? Use night lights. Make sure that you have a light by your bed that is easy to reach. Do not use any sheets or blankets that are too big for your bed. They should not hang down onto the floor. Have a firm chair that has side arms. You can use this for support while you get dressed. Do not have throw rugs and other things on the floor that can make you trip. What can I do in the kitchen? Clean up any spills right away. Avoid walking on wet floors. Keep items that you use a lot in easy-to-reach places. If you need to reach something above you, use a strong step stool that has a grab bar. Keep electrical cords out of the way. Do not use floor polish or wax that makes floors slippery. If you must use wax, use non-skid floor wax. Do not have throw rugs and other things on the floor that can make you trip. What can I do with my  stairs? Do not leave any items on the stairs. Make sure that there are handrails on both sides of the stairs and use them. Fix handrails that are broken or loose. Make sure that handrails are as long as the stairways. Check any carpeting to make sure that it is firmly attached to the stairs. Fix any carpet that is loose or worn. Avoid having throw rugs at the top or bottom of the stairs. If you do have throw rugs, attach them to the floor with carpet tape. Make sure that you have a light switch at the top of the stairs and the bottom of the stairs. If you do not have them, ask someone to add them for you. What else can I do to help  prevent falls? Wear shoes that: Do not have high heels. Have rubber bottoms. Are comfortable and fit you well. Are closed at the toe. Do not wear sandals. If you use a stepladder: Make sure that it is fully opened. Do not climb a closed stepladder. Make sure that both sides of the stepladder are locked into place. Ask someone to hold it for you, if possible. Clearly mark and make sure that you can see: Any grab bars or handrails. First and last steps. Where the edge of each step is. Use tools that help you move around (mobility aids) if they are needed. These include: Canes. Walkers. Scooters. Crutches. Turn on the lights when you go into a dark area. Replace any light bulbs as soon as they burn out. Set up your furniture so you have a clear path. Avoid moving your furniture around. If any of your floors are uneven, fix them. If there are any pets around you, be aware of where they are. Review your medicines with your doctor. Some medicines can make you feel dizzy. This can increase your chance of falling. Ask your doctor what other things that you can do to help prevent falls. This information is not intended to replace advice given to you by your health care provider. Make sure you discuss any questions you have with your health care provider. Document Released: 01/21/2009 Document Revised: 09/02/2015 Document Reviewed: 05/01/2014 Elsevier Interactive Patient Education  2017 [employer].

## 2021-06-20 NOTE — Progress Notes (Signed)
Subjective:   [name] is a 76 y.o. female who presents for Medicare Annual (Subsequent) preventive examination. Virtual Visit via Telephone Note  I connected with  [name] on 06/20/21 at  9:00 AM EDT by telephone and verified that I am speaking with the correct person using two identifiers.  Location: Patient: HOME Provider: [facility] Persons participating in the virtual visit: patient/Nurse Health Advisor   I discussed the limitations, risks, security and privacy concerns of performing an evaluation and management service by telephone and the availability of in person appointments. The patient expressed understanding and agreed to proceed.  Interactive audio and video telecommunications were attempted between this nurse and patient, however failed, due to patient having technical difficulties OR patient did not have access to video capability.  We continued and completed visit with audio only.  Some vital signs may be absent or patient reported.   Chriss Driver, LPN  Review of Systems     Cardiac Risk Factors include: diabetes mellitus;advanced age (>87mn, >>38women);hypertension;dyslipidemia;sedentary lifestyle     Objective:    Today's Vitals   06/20/21 0909 06/20/21 0910  Weight: 151 lb (68.5 kg)   Height: _0  (1.626 m)   PainSc:  6    Body mass index is 25.92 kg/m.  Advanced Directives 06/20/2021 05/31/2021 05/30/2021 12/23/2019 07/01/2019 06/27/2019 05/26/2019  Does Patient Have a Medical Advance Directive? _1  No No  Would patient like information on creating a medical advance directive? No - Patient declined No - Patient declined No - Patient declined - No - Patient declined No - Patient declined No - Patient declined    Current Medications (verified) Outpatient Encounter Medications as of 06/20/2021  Medication Sig   albuterol (VENTOLIN HFA) 108 (90 Base) MCG/ACT inhaler Inhale 2 puffs into the lungs every 6 (six) hours as needed for  wheezing or shortness of breath.   alendronate (FOSAMAX) 70 MG tablet Take 1 tablet (70 mg total) by mouth every 7 (seven) days. Take with a full glass of water on an empty stomach.   amLODipine (NORVASC) 10 MG tablet Take 1 tablet (10 mg total) by mouth daily.   bisoprolol (ZEBETA) 10 MG tablet Take 1 tablet (10 mg total) by mouth daily.   Blood Pressure Monitoring (BLOOD PRESSURE KIT) DEVI 1 Device by Does not apply route 2 (two) times daily.   diclofenac (VOLTAREN) 75 MG EC tablet Take 1 tablet (75 mg total) by mouth 2 (two) times daily.   diclofenac Sodium (VOLTAREN) 1 % GEL Apply 2 g topically 4 (four) times daily.   glucose blood (ONETOUCH VERIO) test strip Use as instructed   HYDROcodone-acetaminophen (NORCO) 5-325 MG tablet Take 1 tablet by mouth every 6 (six) hours as needed for moderate pain.   MODERNA COVID-19 BIVAL BOOSTER 50 MCG/0.5ML injection    ONETOUCH DELICA LANCETS FINE MISC Use to check BG once daily   oxybutynin (DITROPAN XL) 10 MG 24 hr tablet Take 1 tablet (10 mg total) by mouth daily.   rosuvastatin (CRESTOR) 10 MG tablet Take 1 tablet (10 mg total) by mouth daily.   sacubitril-valsartan (ENTRESTO) 97-103 MG Take 1 tablet by mouth twice daily   tamsulosin (FLOMAX) 0.4 MG CAPS capsule Take 1 capsule (0.4 mg total) by mouth daily after supper.   Vitamin D, Ergocalciferol, (DRISDOL) 1.25 MG (50000 UNIT) CAPS capsule Take 1 capsule (50,000 Units total) by mouth every 7 (seven) days.   [DISCONTINUED] escitalopram (LEXAPRO) 20 MG tablet  Take 1 tablet (20 mg total) by mouth daily. (Patient not taking: Reported on 05/10/2020)   No facility-administered encounter medications on file as of 06/20/2021.    Allergies (verified) Levaquin [levofloxacin in d5w] and Penicillins   History: Past Medical History:  Diagnosis Date   Asthma    Chronic systolic heart failure ([place])    Echo (08/26/13):  Mild LVH. EF 25% to 30%. Diffuse HK. Aortic valve: There was trivial regurgitation. Mitral  valve: There was mild regurgitation. Left atrium: The atrium was mildly dilated.   GERD (gastroesophageal reflux disease)    Headache(784.0)    History of nephrolithiasis    Hypertension    NICM (nonischemic cardiomyopathy) ([place])    a. LHC (08/2013):  no CAD   Sleep apnea    does not use machine   Stroke ([place])    Type II or unspecified type diabetes mellitus without mention of complication, uncontrolled    borderline   Past Surgical History:  Procedure Laterality Date   ABDOMINAL HYSTERECTOMY     COLONOSCOPY WITH PROPOFOL N/A 07/01/2019   Procedure: COLONOSCOPY WITH PROPOFOL;  Surgeon: Danie Binder, MD;  Location: [facility];  Service: Endoscopy;  Laterality: N/A;  2:15pm - pt will not move up   [place] LITHOTRIPSY Left 05/31/2021   Procedure: EXTRACORPOREAL SHOCK WAVE LITHOTRIPSY (ESWL);  Surgeon: Cleon Gustin, MD;  Location: [facility] ORS;  Service: Urology;  Laterality: Left;   LEFT AND RIGHT HEART CATHETERIZATION WITH CORONARY ANGIOGRAM N/A 09/03/2013   Procedure: LEFT AND RIGHT HEART CATHETERIZATION WITH CORONARY ANGIOGRAM;  Surgeon: [name], MD;  Location: [facility] CATH LAB;  Service: Cardiovascular;  Laterality: N/A;   OOPHORECTOMY     removed 1 ovary and 1 ovary remains.   POLYPECTOMY  07/01/2019   Procedure: POLYPECTOMY;  Surgeon: Danie Binder, MD;  Location: [facility];  Service: Endoscopy;;   ureteral extraction of kidney stone     Family History  Problem Relation Age of Onset   Hypertension Mother    Arthritis Mother    CAD Mother 95   Hip fracture Mother 56   COPD Father    Crohn's disease Sister    Cancer Sister        lung   Early death Brother 46       house fire   Diabetes Maternal Grandmother    Hypertension Sister    Hypertension Brother    Colon cancer Neg Hx    Social History   Socioeconomic History   Marital status: Divorced    Spouse name: Not on file   Number of children: 2   Years of education: Not on file    Highest education level: Not on file  Occupational History   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 06/19/21-Currently lives with daughter and is actively looking for a rental home.    1 son and 1 daughter. Son died in 05/29/2015.   2 grandchildren   Left handedNo caffiene   Social Determinants of Health   Financial Resource Strain:  Medium Risk   Difficulty of Paying Living Expenses: Somewhat hard  Food Insecurity: No Food Insecurity   Worried About Running Out of Food in the Last Year: Never true   Ran Out of Food in the Last Year: Never true  Transportation Needs: No Transportation Needs   Lack of Transportation (Medical): No   Lack of Transportation (Non-Medical): No  Physical Activity: Inactive   Days of Exercise per Week: 0 days   Minutes of Exercise per Session: 0 min  Stress: Stress Concern Present   Feeling of Stress : To some extent  Social Connections: Moderately Integrated   Frequency of Communication with Friends and Family: More than three times a week   Frequency of Social Gatherings with Friends and Family: More than three times a week   Attends Religious Services: More than 4 times per year   Active Member of [employer] or Organizations: Yes   Attends Music therapist: More than 4 times per year   Marital Status: Divorced    Tobacco Counseling Counseling given: Not Answered   Clinical Intake:  Pre-visit preparation completed: Yes  Pain : 0-10 Pain Score: 6  Pain Type: Acute pain Pain Location: Back Pain Descriptors / Indicators: Aching, Dull Pain  Onset: 1 to 4 weeks ago Pain Frequency: Intermittent     BMI - recorded: 25.92 Nutritional Status: BMI 25 -29 Overweight Nutritional Risks: None Diabetes: Yes  How often do you need to have someone help you when you read instructions, pamphlets, or other written materials from your doctor or pharmacy?: 1 - Never  Diabetic?Nutrition Risk Assessment:  Has the patient had any N/V/D within the last 2 months?  Yes  Does the patient have any non-healing wounds?  No  Has the patient had any unintentional weight loss or weight gain?  No   Diabetes:  Is the patient diabetic?  Yes  If diabetic, was a CBG obtained today?  No  Did the patient bring in their glucometer from home?  No  How often do you monitor your CBG's? Weekly per pt.   Financial Strains and Diabetes Management:  Are you having any financial strains with the device, your supplies or your medication? No .  Does the patient want to be seen by Chronic Care Management for management of their diabetes?  No  Would the patient like to be referred to a Nutritionist or for Diabetic Management?  No   Diabetic Exams:  Diabetic Eye Exam: Completed 2022. Pt has been advised about the importance in completing this exam.  Diabetic Foot Exam: Completed 05/05/2021. Pt has been advised about the importance in completing this exam.   Interpreter Needed?: No      Activities of Daily Living In your present state of health, do you have any difficulty performing the following activities: 06/20/2021 05/30/2021  Hearing? Y N  Vision? N N  Difficulty concentrating or making decisions? Y N  Comment Due to depression issues. -  Walking or climbing stairs? N -  Dressing or bathing? N N  Doing errands, shopping? N N  Preparing Food and eating ? N -  Using the Toilet? N -  In the past six months, have you accidently leaked urine? Y -  Comment Due to kidney stones. -  Do you have problems with loss of bowel control? N -  Managing your  Medications? N -  Managing your Finances? N -  Housekeeping or managing your Housekeeping? N -  Some recent data might be hidden  Patient Care Team: Sharion Balloon, FNP as PCP - General (Family Medicine) Danie Binder, MD (Inactive) as Consulting Physician (Gastroenterology)  Indicate any recent Medical Services you may have received from other than [facility] providers in the past year (date may be approximate).     Assessment:   This is a routine wellness examination for [name].  Hearing/Vision screen Hearing Screening - Comments:: Has some hearing issues.  Vision Screening - Comments:: Readers. [facility] in [place]. 2022.  Dietary issues and exercise activities discussed: Current Exercise Habits: The patient does not participate in regular exercise at present, Exercise limited by: cardiac condition(s);respiratory conditions(s)   Goals Addressed             This Visit's Progress    Exercise 150 min/wk Moderate Activity       Try to exercise more. Try to find rental home.        Depression Screen PHQ 2/9 Scores 06/20/2021 05/05/2021 08/26/2020 02/07/2019 02/07/2019 01/31/2018 01/22/2018  PHQ - 2 Score _0 0 5 5  PHQ- 9 Score _1 - 16 16    Fall Risk Fall Risk  06/20/2021 05/05/2021 08/26/2020 02/07/2019 06/13/2016  Falls in the past year? _2 No  Number falls in past yr: _3 -  Injury with Fall? 0 0 0 0 -  Comment - - - - -  Risk for fall due to : History of fall(s);Impaired balance/gait Impaired balance/gait - Impaired balance/gait -  Follow up Falls prevention discussed Education provided Falls prevention discussed Falls prevention discussed -    FALL RISK PREVENTION PERTAINING TO THE HOME:  Any stairs in or around the home? Yes  If so, are there any without handrails? No  Home free of loose throw rugs in walkways, pet beds, electrical cords, etc? Yes  Adequate lighting in your home to reduce risk of falls? Yes   ASSISTIVE DEVICES UTILIZED TO  PREVENT FALLS:  Life alert? No  Use of a cane, walker or w/c? No  Grab bars in the bathroom? No  Shower chair or bench in shower? No  Elevated toilet seat or a handicapped toilet? No   TIMED UP AND GO:  Was the test performed? No .  Phone visit.   Cognitive Function: Normal cognitive status assessed by direct observation by this Nurse Health Advisor. No abnormalities found.          Immunizations Immunization History  Administered Date(s) Administered   Fluad Quad(high Dose 65+) 02/07/2019, 02/07/2021   Influenza, High Dose Seasonal PF 01/22/2018   Influenza,inj,Quad PF,6+ Mos 01/13/2015, 12/30/2015   Moderna Sars-Covid-2 Vaccination 03/16/2020   PFIZER(Purple Top)SARS-COV-2 Vaccination 06/08/2019, 07/08/2019   Pneumococcal Conjugate-13 02/27/2014   Pneumococcal Polysaccharide-23 04/25/2010   Tdap 12/23/2019    TDAP status: Up to date  Flu Vaccine status: Up to date  Pneumococcal vaccine status: Up to date  Covid-19 vaccine status: Completed vaccines  Qualifies for Shingles Vaccine? Yes   Zostavax completed No   Shingrix Completed?: No.    Education has been provided regarding the importance of this vaccine. Patient has been advised to call insurance company to determine out of pocket expense if they have not yet received this vaccine. Advised may also receive vaccine at local pharmacy or Health Dept. Verbalized acceptance and understanding.  Screening Tests Health Maintenance  Topic Date Due   OPHTHALMOLOGY EXAM  07/03/2015   COVID-19 Vaccine (4 - Booster for Pfizer series) 05/11/2020   Zoster Vaccines-  Shingrix (1 of 2) 08/03/2021 (Originally 10/21/1994)   HEMOGLOBIN A1C  11/02/2021   FOOT EXAM  05/05/2022   DEXA SCAN  05/06/2023   TETANUS/TDAP  12/22/2029   Pneumonia Vaccine 77+ Years old  Completed   INFLUENZA VACCINE  Completed   Hepatitis C Screening  Completed   HPV VACCINES  Aged Out   COLONOSCOPY (Pts 45-73yr Insurance coverage will need to be  confirmed)  Discontinued    Health Maintenance  Health Maintenance Due  Topic Date Due   OPHTHALMOLOGY EXAM  07/03/2015   COVID-19 Vaccine (4 - Booster for Pfizer series) 05/11/2020    Colorectal cancer screening: No longer required.   Mammogram status: Completed 01/07/2018. Repeat every year  Bone Density status: Completed 05/05/2021. Results reflect: Bone density results: OSTEOPOROSIS. Repeat every 2 years.  Lung Cancer Screening: (Low Dose CT Chest recommended if Age 77-80years, 30 pack-year currently smoking OR have quit w/in 15years.) does not qualify.  Additional Screening:  Hepatitis C Screening: does qualify; Completed 01/22/2018  Vision Screening: Recommended annual ophthalmology exams for early detection of glaucoma and other disorders of the eye. Is the patient up to date with their annual eye exam?  Yes  Who is the provider or what is the name of the office in which the patient attends annual eye exams? [facility] [place] If pt is not established with a provider, would they like to be referred to a provider to establish care? No .   Dental Screening: Recommended annual dental exams for proper oral hygiene  Community Resource Referral / Chronic Care Management: CRR required this visit?  No   CCM required this visit?  Yes      Plan:     I have personally reviewed and noted the following in the patients chart:   Medical and social history Use of alcohol, tobacco or illicit drugs  Current medications and supplements including opioid prescriptions.  Functional ability and status Nutritional status Physical activity Advanced directives List of other physicians Hospitalizations, surgeries, and ER visits in previous 12 months Vitals Screenings to include cognitive, depression, and falls Referrals and appointments  In addition, I have reviewed and discussed with patient certain preventive protocols, quality metrics, and best practice recommendations. A written  personalized care plan for preventive services as well as general preventive health recommendations were provided to patient.     MChriss Driver LPN   [id number]  Nurse Notes: Order placed today for mammogram at patient's request. Discussed Shingrix and how to obtain. Pt c/o issues with affording Entresto. Discussed CCM referral and pt is agreeable. Order placed.

## 2021-06-20 NOTE — Progress Notes (Signed)
Assessment: 1. Acute cystitis without hematuria   2. Nephrolithiasis   3. Urinary incontinence, unspecified type     Plan: Prescription for Bactrim DS twice daily for 7 days sent to the patient's pharmacy.  Urine is sent for culture.  We will adjust treatment as needed if culture results indicate.  Follow-up in 6 weeks after renal ultrasound.  Continue Flomax.  Will bring stone fragments at her follow-up visit.  Chief Complaint: No chief complaint on file.   HPI: [name] is a 76 y.o. female who presents for continued evaluation of nephrolithiasis and acute c/o UTI symptoms.  Patient had EWSL on left-sided renal stones on 05/31/2021.  She has been doing well, but does have some urinary burning and urgency today.  No gross hematuria.  No fever, chills.  She is passing a few stone particles.  Urinary incontinence symptoms unchanged. KUB images reviewed during patient's office visit.  Right-sided renal stones unchanged from previous films.  The large stone previously seen on the left appears to be fragmented compared to previous imaging. UA = greater than 30 WBCs, 3-10 RBCs, many bacteria, nitrite negative  Urine culture on 05/05/2021 and 05/18/2021 = less than 10 K mixed flora  Portions of the above documentation were copied from a prior visit for review purposes only.  Allergies: Allergies  Allergen Reactions   Levaquin [Levofloxacin In D5w] Diarrhea   Penicillins Rash    PMH: Past Medical History:  Diagnosis Date   Asthma    Chronic systolic heart failure (HCC)    Echo (08/26/13):  Mild LVH. EF 25% to 30%. Diffuse HK. Aortic valve: There was trivial regurgitation. Mitral valve: There was mild regurgitation. Left atrium: The atrium was mildly dilated.   GERD (gastroesophageal reflux disease)    Headache(784.0)    History of nephrolithiasis    Hypertension    NICM (nonischemic cardiomyopathy) (HCC)    a. LHC (08/2013):  no CAD   Sleep apnea    does not use machine    Stroke (HCC)    Type II or unspecified type diabetes mellitus without mention of complication, uncontrolled    borderline    PSH: Past Surgical History:  Procedure Laterality Date   ABDOMINAL HYSTERECTOMY     COLONOSCOPY WITH PROPOFOL N/A 07/01/2019   Procedure: COLONOSCOPY WITH PROPOFOL;  Surgeon: West Bali, MD;  Location: [facility];  Service: Endoscopy;  Laterality: N/A;  2:15pm - pt will not move up   EXTRACORPOREAL SHOCK WAVE LITHOTRIPSY Left 05/31/2021   Procedure: EXTRACORPOREAL SHOCK WAVE LITHOTRIPSY (ESWL);  Surgeon: Malen Gauze, MD;  Location: [facility];  Service: Urology;  Laterality: Left;   LEFT AND RIGHT HEART CATHETERIZATION WITH CORONARY ANGIOGRAM N/A 09/03/2013   Procedure: LEFT AND RIGHT HEART CATHETERIZATION WITH CORONARY ANGIOGRAM;  Surgeon: Kathleene Hazel, MD;  Location: [facility];  Service: Cardiovascular;  Laterality: N/A;   OOPHORECTOMY     removed 1 ovary and 1 ovary remains.   POLYPECTOMY  07/01/2019   Procedure: POLYPECTOMY;  Surgeon: West Bali, MD;  Location: [facility];  Service: Endoscopy;;   ureteral extraction of kidney stone      SH: Social History   Tobacco Use   Smoking status: Never   Smokeless tobacco: Never  Vaping Use   Vaping Use: Never used  Substance Use Topics   Alcohol use: Yes    Comment: rare   Drug use: No    ROS: Constitutional:  Negative for fever, chills, weight loss  CV: Negative for chest pain Respiratory:  Negative for shortness of breath, wheezing, sleep apnea, frequent cough GI:  Negative for nausea, vomiting, bloody stool, GERD  PE: BP (!) 160/88   Pulse (!) 112   Ht 5\' 4"  (1.626 m)   Wt 151 lb (68.5 kg)   LMP 01/22/1981 (LMP Unknown)   BMI 25.92 kg/m  GENERAL APPEARANCE:  Well appearing, well developed, well nourished, NAD HEENT:  Atraumatic, normocephalic NECK:  Supple. Trachea midline ABDOMEN:  Soft, non-tender, no masses EXTREMITIES:  Moves all extremities well NEUROLOGIC:   Alert and oriented x 3, normal gait, CN II-XII grossly intact MENTAL STATUS:  appropriate BACK:  Non-tender to palpation, No CVAT SKIN:  Warm, dry, and intact   Results: Laboratory Data: Lab Results  Component Value Date   WBC 8.2 05/05/2021   HGB 14.5 05/05/2021   HCT 43.6 05/05/2021   MCV 87 05/05/2021   PLT 268 05/05/2021    Lab Results  Component Value Date   CREATININE 0.97 05/05/2021    Lab Results  Component Value Date   HGBA1C 7.1 (H) 05/05/2021    Urinalysis    Component Value Date/Time   COLORURINE YELLOW 12/16/2013 1120   APPEARANCEUR Clear 05/18/2021 1511   LABSPEC 1.013 12/16/2013 1120   PHURINE 6.5 12/16/2013 1120   GLUCOSEU Negative 05/18/2021 1511   HGBUR MODERATE (A) 12/16/2013 1120   BILIRUBINUR Negative 05/18/2021 1511   KETONESUR NEGATIVE 12/16/2013 1120   PROTEINUR 2+ (A) 05/18/2021 1511   PROTEINUR 30 (A) 12/16/2013 1120   UROBILINOGEN negative 07/24/2014 1519   UROBILINOGEN 0.2 12/16/2013 1120   NITRITE Negative 05/18/2021 1511   NITRITE NEGATIVE 12/16/2013 1120   LEUKOCYTESUR 2+ (A) 05/18/2021 1511    Lab Results  Component Value Date   LABMICR See below: 05/18/2021   WBCUA 11-30 (A) 05/18/2021   LABEPIT 0-10 05/18/2021   MUCUS Present 05/18/2021   BACTERIA Many (A) 05/18/2021    Pertinent Imaging:  Results for orders placed during the hospital encounter of 05/31/21  DG Abd 1 View  Narrative CLINICAL DATA:  Renal calculi.  Pre lithotripsy.  EXAM: ABDOMEN - 1 VIEW  COMPARISON:  CT scan 05/23/2021  FINDINGS: Stable bilateral renal calculi with a staghorn type calculus in the right renal pelvis and a 17 mm calculus in the left renal pelvis. Other smaller renal calculi are noted. No bladder calculi.  The bowel gas pattern is unremarkable. The bony structures are intact. Moderate thoracolumbar scoliosis again noted.  IMPRESSION: Bilateral renal calculi.   Electronically Signed By: Rudie Meyer M.D. On: 05/31/2021  10:25  No results found for this or any previous visit.  No results found for this or any previous visit.  No results found for this or any previous visit.  No results found for this or any previous visit.  No results found for this or any previous visit.  Results for orders placed in visit on 05/18/21  CT HEMATURIA WORKUP  Narrative CLINICAL DATA:  Hematuria over the last 3 weeks. History of diabetes and hypertension. Cystitis. Urinary incontinence.  EXAM: CT ABDOMEN AND PELVIS WITHOUT AND WITH CONTRAST  TECHNIQUE: Multidetector CT imaging of the abdomen and pelvis was performed following the standard protocol before and following the bolus administration of intravenous contrast.  RADIATION DOSE REDUCTION: This exam was performed according to the departmental dose-optimization program which includes automated exposure control, adjustment of the mA and/or kV according to patient size and/or use of iterative reconstruction technique.  CONTRAST:  OMNIPAQUE  IOHEXOL 300 MG/ML  SOLN  COMPARISON:  CT abdomen 10/04/2001  FINDINGS: Lower chest: Mild scarring in the right middle lobe, right lower lobe, and lingula. 4 by 3 mm pleural-based nodule along the left lower lobe on image 61 series 15, not changed from 05/11/2020. Small to moderate-sized hiatal hernia. Mild cardiomegaly. Descending thoracic aortic atherosclerosis.  Hepatobiliary: Contracted gallbladder.  Otherwise unremarkable.  Pancreas: Unremarkable  Spleen: Unremarkable  Adrenals/Urinary Tract: Both adrenal glands appear normal.  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 A branching 2.2 by 1.7 cm collecting system staghorn calculus in the right renal pelvis is associated with lower pole and mid kidney caliectasis  2.5 by 1.8 cm filling defect in the right mid to lower kidney infundibulum and calyx proximal to the stone on image 56 series 16 does not appear to enhance and accordingly favors  plan thrombus over tumor. Similar filling defect is observed in much of the anterior kidney collecting system as shown for example on image 37 series 13. Within the blunted calices the presumed clot as outlined by excreted contrast.  There calcifications near the distal ureters but not within the distal ureters them cells. A separate right renal calculus is nonobstructive and measures 3 mm in diameter on image 34 series 2.  Which like the contralateral side, there is a staghorn calculus in the left mid kidney collecting system abutting the UPJ, measuring 1.7 cm in long axis. Release 5 additional nonobstructive renal calculi are present in the left kidney. There is no overt left hydronephrosis nor do we demonstrate additional casting filling defects in the left kidney. There is some mild left caliectasis particularly in upper pole example on image 32 series 13.  There is abnormal wall thickening in the right proximal ureter extending along 4.3 cm from the stone, possibly from inflammatory wall thickening, less likely to be from thrombus or tumor. Similar appearance on the left extending along 3.4 cm distal to the collecting system calculus in the left proximal ureter with only a thin channel of contrast in this vicinity.  Urinary bladder unremarkable.  Stomach/Bowel: Hiatal hernia.  Sigmoid colon diverticulosis.  Vascular/Lymphatic: Atherosclerosis is present, including aortoiliac atherosclerotic disease and atherosclerotic calcifications in branches of the abdominal aorta.  Reproductive: Uterus absent.  Adnexa unremarkable.  Other: No supplemental non-categorized findings.  Musculoskeletal: Thoracic and lumbar spondylosis and degenerative disc disease.  IMPRESSION: 1. Bilateral staghorn calculi in the collecting systems extending to the UPJ on both sides. These are associated with abnormal thickening of the proximal ureters bilaterally, as well as mild  bilateral hydronephrosis. 2. In the right renal collecting system, the large staghorn calculus is associated with abnormal noncalcified filling defect in anterior infundibula and calices probably from blood clot given the fact that this abnormal density filling the infundibulum and calices is not appreciably enhancing. Difficult to exclude small amounts of tumor in this region given the substantially abnormal appearance. See for example image 37 series 13. 3. Renal atrophy and scarring with renal sinus lipomatosis. 4. Additional nonobstructive renal calculi are present bilaterally. 5. Stable 4 by 3 mm pleural-based nodule in the left lower lobe, not changed over the last year, thought to likely be benign. 6. Mild bibasilar scarring. 7. Other imaging findings of potential clinical significance: Small to moderate-sized hiatal hernia. Mild cardiomegaly. Sigmoid colon diverticulosis. Aortic Atherosclerosis (ICD10-I70.0). Systemic atherosclerosis. Thoracolumbar spondylosis and degenerative disc disease.   Electronically Signed By: Gaylyn Rong M.D. On: 05/23/2021 14:17  No results found for this or any previous visit.  No results found for this or any previous visit (from the past 24 hour(s)).

## 2021-06-22 ENCOUNTER — Telehealth: Payer: Self-pay

## 2021-06-22 LAB — URINE CULTURE

## 2021-06-22 NOTE — Chronic Care Management (AMB) (Signed)
?  Chronic Care Management  ? ?Outreach Note ? ?06/22/2021 ?Name: [name] MRN: [id number] DOB: [birth year] ? ?[name] is a 76 y.o. year old female who is a primary care patient of Sharion Balloon, FNP. I reached out to [name] by phone today in response to a referral sent by [name]'s primary care provider. ? ?An unsuccessful telephone outreach was attempted today. The patient was referred to the case management team for assistance with care management and care coordination.  ? ?Follow Up Plan: A HIPAA compliant phone message was left for the patient providing contact information and requesting a return call.  ?The care management team will reach out to the patient again over the next 5 days.  ?If patient returns call to provider office, please advise to call [place] * at [phone]* ? ?[name], RMA ?Care Guide, Embedded Care Coordination ?[facility]  ?[place], [place] [address] ?Direct Dial: [phone] ?Museum/gallery [email or web address] ?Website: [email or web address]  ? ?

## 2021-06-24 ENCOUNTER — Other Ambulatory Visit: Payer: Self-pay

## 2021-06-24 ENCOUNTER — Other Ambulatory Visit: Payer: Medicare HMO

## 2021-06-24 DIAGNOSIS — N3001 Acute cystitis with hematuria: Secondary | ICD-10-CM

## 2021-06-24 DIAGNOSIS — N200 Calculus of kidney: Secondary | ICD-10-CM

## 2021-06-24 LAB — URINALYSIS, ROUTINE W REFLEX MICROSCOPIC
Bilirubin, UA: NEGATIVE
Glucose, UA: NEGATIVE
Ketones, UA: NEGATIVE
Nitrite, UA: NEGATIVE
Specific Gravity, UA: 1.02 (ref 1.005–1.030)
Urobilinogen, Ur: 0.2 mg/dL (ref 0.2–1.0)
pH, UA: 6.5 (ref 5.0–7.5)

## 2021-06-24 LAB — MICROSCOPIC EXAMINATION
Renal Epithel, UA: NONE SEEN /hpf
WBC, UA: >30 /hpf — AB (ref 0–5)

## 2021-06-27 ENCOUNTER — Inpatient Hospital Stay (HOSPITAL_COMMUNITY)
Admission: RE | Admit: 2021-06-27 | Discharge: 2021-06-27 | Disposition: A | Discharge status: Home / Self Care | Payer: Medicare HMO | Source: Ambulatory Visit

## 2021-06-27 ENCOUNTER — Other Ambulatory Visit: Payer: Self-pay

## 2021-06-27 ENCOUNTER — Ambulatory Visit: Payer: Medicare HMO

## 2021-06-27 DIAGNOSIS — N200 Calculus of kidney: Secondary | ICD-10-CM

## 2021-06-27 NOTE — Pre-Procedure Instructions (Signed)
Attempted to call patient again with no answer. Left her a second message to please return our call. ?

## 2021-06-27 NOTE — Pre-Procedure Instructions (Signed)
Messaged Gibson Ramp at Dr Ruel Favors office about not being able to reach patient. ?

## 2021-06-27 NOTE — Pre-Procedure Instructions (Signed)
Attempted to do pre-op phone call. Left a voicemail for patient to call back. ?

## 2021-06-28 ENCOUNTER — Ambulatory Visit (HOSPITAL_COMMUNITY): Payer: Medicare HMO

## 2021-06-28 ENCOUNTER — Ambulatory Visit (HOSPITAL_COMMUNITY)
Admission: RE | Admit: 2021-06-28 | Discharge: 2021-06-28 | Disposition: A | Discharge status: Home / Self Care | Payer: Medicare HMO | Attending: Urology | Admitting: Urology

## 2021-06-28 ENCOUNTER — Encounter (HOSPITAL_COMMUNITY): Payer: Self-pay | Admitting: Urology

## 2021-06-28 ENCOUNTER — Encounter (HOSPITAL_COMMUNITY)
Admission: RE | Disposition: A | Discharge status: Home / Self Care | Payer: Self-pay | Source: Home / Self Care | Attending: Urology

## 2021-06-28 DIAGNOSIS — E119 Type 2 diabetes mellitus without complications: Secondary | ICD-10-CM | POA: Diagnosis not present

## 2021-06-28 DIAGNOSIS — N200 Calculus of kidney: Secondary | ICD-10-CM | POA: Insufficient documentation

## 2021-06-28 DIAGNOSIS — I110 Hypertensive heart disease with heart failure: Secondary | ICD-10-CM | POA: Diagnosis not present

## 2021-06-28 DIAGNOSIS — I509 Heart failure, unspecified: Secondary | ICD-10-CM | POA: Diagnosis not present

## 2021-06-28 HISTORY — PX: EXTRACORPOREAL SHOCK WAVE LITHOTRIPSY: SHX1557

## 2021-06-28 LAB — GLUCOSE, CAPILLARY: Glucose-Capillary: 130 mg/dL — ABNORMAL HIGH (ref 70–99)

## 2021-06-28 SURGERY — LITHOTRIPSY, ESWL
Anesthesia: LOCAL | Laterality: Left

## 2021-06-28 MED ORDER — TAMSULOSIN HCL 0.4 MG PO CAPS: 0.4000 mg | ORAL_CAPSULE | Freq: Every day | ORAL | 1 refills | Status: DC

## 2021-06-28 MED ORDER — DIPHENHYDRAMINE HCL 25 MG PO CAPS
25.0000 mg | ORAL_CAPSULE | ORAL | Status: AC
  Administered 2021-06-28: 25 mg via ORAL
  Filled 2021-06-28: qty 1

## 2021-06-28 MED ORDER — SODIUM CHLORIDE 0.9 % IV SOLN
INTRAVENOUS | Status: DC
  Administered 2021-06-28: 1000 mL via INTRAVENOUS

## 2021-06-28 MED ORDER — DIAZEPAM 5 MG PO TABS
10.0000 mg | ORAL_TABLET | Freq: Once | ORAL | Status: AC
  Administered 2021-06-28: 10 mg via ORAL
  Filled 2021-06-28: qty 2

## 2021-06-28 MED ORDER — HYDROCODONE-ACETAMINOPHEN 5-325 MG PO TABS: 1.0000 | ORAL_TABLET | Freq: Four times a day (QID) | ORAL | 0 refills | Status: DC | PRN

## 2021-06-29 ENCOUNTER — Encounter (HOSPITAL_COMMUNITY): Payer: Self-pay | Admitting: Urology

## 2021-07-05 NOTE — Chronic Care Management (AMB) (Signed)
?  Chronic Care Management  ? ?Outreach Note ? ?07/05/2021 ?Name: REBEKAH ZACKERY MRN: [id number] DOB: [birth year] ? ?HANAKO TIPPING is a 76 y.o. year old female who is a primary care patient of Sharion Balloon, FNP. I reached out to Christella Noa by phone today in response to a referral sent by Ms. Randall An Montz's primary care provider. ? ?A second unsuccessful telephone outreach was attempted today. The patient was referred to the case management team for assistance with care management and care coordination.  ? ?Follow Up Plan: The care management team will reach out to the patient again over the next 5 days.  ?If patient returns call to provider office, please advise to call Embedded Care Management Care Guide Noreene Larsson * at [phone]* ? ?Noreene Larsson, RMA ?Care Guide, Embedded Care Coordination ?[facility]  ?[place], [place] [address] ?Direct Dial: [phone] ?Museum/gallery [email or web address] ?Website: [email or web address]  ? ?

## 2021-07-11 ENCOUNTER — Telehealth: Payer: Self-pay | Admitting: Family

## 2021-07-11 NOTE — Telephone Encounter (Signed)
Patient called in regard to procedure done on 3/21 ? ?Wants to know if xray is needed before next visit on 4/5 ? ?Patient would like a call back. ?

## 2021-07-12 NOTE — Telephone Encounter (Signed)
Patient informed to have KUB done prior to tomorrows appt. Patient voiced understanding.  ?

## 2021-07-13 ENCOUNTER — Ambulatory Visit (INDEPENDENT_AMBULATORY_CARE_PROVIDER_SITE_OTHER): Payer: Medicare HMO | Admitting: Urology

## 2021-07-13 ENCOUNTER — Encounter: Payer: Self-pay | Admitting: Urology

## 2021-07-13 ENCOUNTER — Ambulatory Visit (HOSPITAL_COMMUNITY)
Admission: RE | Admit: 2021-07-13 | Discharge: 2021-07-13 | Disposition: A | Discharge status: Home / Self Care | Payer: Medicare HMO | Source: Ambulatory Visit | Attending: Urology | Admitting: Urology

## 2021-07-13 VITALS — BP 172/97 | HR 82

## 2021-07-13 DIAGNOSIS — N200 Calculus of kidney: Secondary | ICD-10-CM | POA: Insufficient documentation

## 2021-07-13 DIAGNOSIS — R109 Unspecified abdominal pain: Secondary | ICD-10-CM | POA: Diagnosis not present

## 2021-07-13 LAB — URINALYSIS, ROUTINE W REFLEX MICROSCOPIC
Bilirubin, UA: NEGATIVE
Glucose, UA: NEGATIVE
Ketones, UA: NEGATIVE
Nitrite, UA: NEGATIVE
Specific Gravity, UA: 1.025 (ref 1.005–1.030)
Urobilinogen, Ur: 0.2 mg/dL (ref 0.2–1.0)
pH, UA: 5.5 (ref 5.0–7.5)

## 2021-07-13 LAB — MICROSCOPIC EXAMINATION: WBC, UA: >30 /hpf — AB (ref 0–5)

## 2021-07-13 NOTE — Patient Instructions (Signed)
Dietary Guidelines to Help Prevent Kidney Stones Kidney stones are deposits of minerals and salts that form inside your kidneys. Your risk of developing kidney stones may be greater depending on your diet, your lifestyle, the medicines you take, and whether you have certain medical conditions. Most people can lower their chances of developing kidney stones by following the instructions below. Your dietitian may give you more specific instructions depending on your overall health and the type of kidney stones you tend to develop. What are tips for following this plan? Reading food labels  Choose foods with "no salt added" or "low-salt" labels. Limit your salt (sodium) intake to less than 1,500 mg a day. Choose foods with calcium for each meal and snack. Try to eat about 300 mg of calcium at each meal. Foods that contain 200-500 mg of calcium a serving include: 8 oz (237 mL) of milk, calcium-fortifiednon-dairy milk, and calcium-fortifiedfruit juice. Calcium-fortified means that calcium has been added to these drinks. 8 oz (237 mL) of kefir, yogurt, and soy yogurt. 4 oz (114 g) of tofu. 1 oz (28 g) of cheese. 1 cup (150 g) of dried figs. 1 cup (91 g) of cooked broccoli. One 3 oz (85 g) can of sardines or mackerel. Most people need 1,000-1,500 mg of calcium a day. Talk to your dietitian about how much calcium is recommended for you. Shopping Buy plenty of fresh fruits and vegetables. Most people do not need to avoid fruits and vegetables, even if these foods contain nutrients that may contribute to kidney stones. When shopping for convenience foods, choose: Whole pieces of fruit. Pre-made salads with dressing on the side. Low-fat fruit and yogurt smoothies. Avoid buying frozen meals or prepared deli foods. These can be high in sodium. Look for foods with live cultures, such as yogurt and kefir. Choose high-fiber grains, such as whole-wheat breads, oat bran, and wheat cereals. Cooking Do not add  salt to food when cooking. Place a salt shaker on the table and allow each person to add his or her own salt to taste. Use vegetable protein, such as beans, textured vegetable protein (TVP), or tofu, instead of meat in pasta, casseroles, and soups. Meal planning Eat less salt, if told by your dietitian. To do this: Avoid eating processed or pre-made food. Avoid eating fast food. Eat less animal protein, including cheese, meat, poultry, or fish, if told by your dietitian. To do this: Limit the number of times you have meat, poultry, fish, or cheese each week. Eat a diet free of meat at least 2 days a week. Eat only one serving each day of meat, poultry, fish, or seafood. When you prepare animal protein, cut pieces into small portion sizes. For most meat and fish, one serving is about the size of the palm of your hand. Eat at least five servings of fresh fruits and vegetables each day. To do this: Keep fruits and vegetables on hand for snacks. Eat one piece of fruit or a handful of berries with breakfast. Have a salad and fruit at lunch. Have two kinds of vegetables at dinner. Limit foods that are high in a substance called oxalate. These include: Spinach (cooked), rhubarb, beets, sweet potatoes, and Swiss chard. Peanuts. Potato chips, french fries, and baked potatoes with skin on. Nuts and nut products. Chocolate. If you regularly take a diuretic medicine, make sure to eat at least 1 or 2 servings of fruits or vegetables that are high in potassium each day. These include: Avocado. Banana. Orange, prune,   carrot, or tomato juice. Baked potato. Cabbage. Beans and split peas. Lifestyle  Drink enough fluid to keep your urine pale yellow. This is the most important thing you can do. Spread your fluid intake throughout the day. If you drink alcohol: Limit how much you use to: 0-1 drink a day for women who are not pregnant. 0-2 drinks a day for men. Be aware of how much alcohol is in your  drink. In the [place] one drink equals one 12 oz bottle of beer (355 mL), one 5 oz glass of wine (148 mL), or one 1 oz glass of hard liquor (44 mL). Lose weight if told by your health care provider. Work with your dietitian to find an eating plan and weight loss strategies that work best for you. General information Talk to your health care provider and dietitian about taking daily supplements. You may be told the following depending on your health and the cause of your kidney stones: Not to take supplements with vitamin C. To take a calcium supplement. To take a daily probiotic supplement. To take other supplements such as magnesium, fish oil, or vitamin B6. Take over-the-counter and prescription medicines only as told by your health care provider. These include supplements. What foods should I limit? Limit your intake of the following foods, or eat them as told by your dietitian. Vegetables Spinach. Rhubarb. Beets. Canned vegetables. Pickles. Olives. Baked potatoes with skin. Grains Wheat bran. Baked goods. Salted crackers. Cereals high in sugar. Meats and other proteins Nuts. Nut butters. Large portions of meat, poultry, or fish. Salted, precooked, or cured meats, such as sausages, meat loaves, and hot dogs. Dairy Cheese. Beverages Regular soft drinks. Regular vegetable juice. Seasonings and condiments Seasoning blends with salt. Salad dressings. Soy sauce. Ketchup. Barbecue sauce. Other foods Canned soups. Canned pasta sauce. Casseroles. Pizza. Lasagna. Frozen meals. Potato chips. French fries. The items listed above may not be a complete list of foods and beverages you should limit. Contact a dietitian for more information. What foods should I avoid? Talk to your dietitian about specific foods you should avoid based on the type of kidney stones you have and your overall health. Fruits Grapefruit. The item listed above may not be a complete list of foods and beverages you should  avoid. Contact a dietitian for more information. Summary Kidney stones are deposits of minerals and salts that form inside your kidneys. You can lower your risk of kidney stones by making changes to your diet. The most important thing you can do is drink enough fluid. Drink enough fluid to keep your urine pale yellow. Talk to your dietitian about how much calcium you should have each day, and eat less salt and animal protein as told by your dietitian. This information is not intended to replace advice given to you by your health care provider. Make sure you discuss any questions you have with your health care provider. Document Revised: 03/20/2019 Document Reviewed: 03/20/2019 Elsevier Patient Education  2022 [employer]  

## 2021-07-13 NOTE — H&P (View-Only) (Signed)
? ?07/13/2021 ?2:36 PM  ? ?[name] ?1945-06-25 ?[id number] ? ?Referring provider: Sharion Balloon, FNP ?[address] ?[place],   [address] ? ?Followup nephrolithiasis ? ? ?HPI: ? ?[name] is a 76yo here for followup for nephrolithiasis. She has passed numerous fragments since her last ESWL. KUB from today shows decreased left renal stone burden. NO LUTS.  ? ?PMH: ?Past Medical History:  ?Diagnosis Date  ? Asthma   ? Chronic systolic heart failure ([place])   ? Echo (08/26/13):  Mild LVH. EF 25% to 30%. Diffuse HK. Aortic valve: There was trivial regurgitation. Mitral valve: There was mild regurgitation. Left atrium: The atrium was mildly dilated.  ? GERD (gastroesophageal reflux disease)   ? Headache(784.0)   ? History of nephrolithiasis   ? Hypertension   ? NICM (nonischemic cardiomyopathy) ([place])   ? a. LHC (08/2013):  no CAD  ? Sleep apnea   ? does not use machine  ? Stroke [facility])   ? Type II or unspecified type diabetes mellitus without mention of complication, uncontrolled   ? borderline  ? ? ?Surgical History: ?Past Surgical History:  ?Procedure Laterality Date  ? ABDOMINAL HYSTERECTOMY    ? COLONOSCOPY WITH PROPOFOL N/A 07/01/2019  ? Procedure: COLONOSCOPY WITH PROPOFOL;  Surgeon: Danie Binder, MD;  [facility];  Service: Endoscopy;  Laterality: N/A;  2:15pm - pt will not move up  ? EXTRACORPOREAL SHOCK WAVE LITHOTRIPSY Left 05/31/2021  ? Procedure: EXTRACORPOREAL SHOCK WAVE LITHOTRIPSY (ESWL);  Surgeon: Cleon Gustin, MD;  Location: [facility] ORS;  Service: Urology;  Laterality: Left;  ? EXTRACORPOREAL SHOCK WAVE LITHOTRIPSY Left 06/28/2021  ? Procedure: EXTRACORPOREAL SHOCK WAVE LITHOTRIPSY (ESWL);  Surgeon: Cleon Gustin, MD;  Location: [facility] ORS;  Service: Urology;  Laterality: Left;  ? LEFT AND RIGHT HEART CATHETERIZATION WITH CORONARY ANGIOGRAM N/A 09/03/2013  ? Procedure: LEFT AND RIGHT HEART CATHETERIZATION WITH CORONARY ANGIOGRAM;  Surgeon: [name], MD;   Location: [facility] CATH LAB;  Service: Cardiovascular;  Laterality: N/A;  ? OOPHORECTOMY    ? removed 1 ovary and 1 ovary remains.  ? POLYPECTOMY  07/01/2019  ? Procedure: POLYPECTOMY;  Surgeon: Danie Binder, MD;  [facility];  Service: Endoscopy;;  ? ureteral extraction of kidney stone    ? ? ?Home Medications:  ?Allergies as of 07/13/2021   ? ?   Reactions  ? Levaquin [levofloxacin In D5w] Diarrhea  ? Penicillins Rash  ? ?  ? ?  ?Medication List  ?  ? ?  ? Accurate as of July 13, 2021  2:36 PM. If you have any questions, ask your nurse or doctor.  ?  ?  ? ?  ? ?albuterol 108 (90 Base) MCG/ACT inhaler ?Commonly known as: VENTOLIN HFA ?Inhale 2 puffs into the lungs every 6 (six) hours as needed for wheezing or shortness of breath. ?  ?alendronate 70 MG tablet ?Commonly known as: Fosamax ?Take 1 tablet (70 mg total) by mouth every 7 (seven) days. Take with a full glass of water on an empty stomach. ?  ?amLODipine 10 MG tablet ?Commonly known as: NORVASC ?Take 1 tablet (10 mg total) by mouth daily. ?  ?bisoprolol 10 MG tablet ?Commonly known as: ZEBETA ?Take 1 tablet (10 mg total) by mouth daily. ?  ?Blood Pressure Kit Devi ?1 Device by Does not apply route 2 (two) times daily. ?  ?diclofenac 75 MG EC tablet ?Commonly known as: VOLTAREN ?Take 1 tablet (75 mg total) by  mouth 2 (two) times daily. ?  ?diclofenac Sodium 1 % Gel ?Commonly known as: Voltaren ?Apply 2 g topically 4 (four) times daily. ?  ?Entresto 97-103 MG ?Generic drug: sacubitril-valsartan ?Take 1 tablet by mouth twice daily ?  ?glucose blood test strip ?Commonly known as: OneTouch Verio ?Use as instructed ?  ?HYDROcodone-acetaminophen 5-325 MG tablet ?Commonly known as: Norco ?Take 1 tablet by mouth every 6 (six) hours as needed for moderate pain. ?  ?Moderna COVID-19 Bival Booster 50 MCG/0.5ML injection ?Generic drug: COVID-19 mRNA bivalent vaccine (Moderna) ?  ?OneTouch Delica Lancets Fine Misc ?Use to check BG once daily ?  ?oxybutynin 10 MG 24  hr tablet ?Commonly known as: Ditropan XL ?Take 1 tablet (10 mg total) by mouth daily. ?  ?rosuvastatin 10 MG tablet ?Commonly known as: Crestor ?Take 1 tablet (10 mg total) by mouth daily. ?  ?sulfamethoxazole-trimethoprim 800-160 MG tablet ?Commonly known as: BACTRIM DS ?Take 1 tablet by mouth 2 (two) times daily. ?  ?tamsulosin 0.4 MG Caps capsule ?Commonly known as: Flomax ?Take 1 capsule (0.4 mg total) by mouth daily after supper. ?  ?Vitamin D (Ergocalciferol) 1.25 MG (50000 UNIT) Caps capsule ?Commonly known as: DRISDOL ?Take 1 capsule (50,000 Units total) by mouth every 7 (seven) days. ?  ? ?  ? ? ?Allergies:  ?Allergies  ?Allergen Reactions  ? Levaquin [Levofloxacin In D5w] Diarrhea  ? Penicillins Rash  ? ? ?Family History: ?Family History  ?Problem Relation Age of Onset  ? Hypertension Mother   ? Arthritis Mother   ? CAD Mother 37  ? Hip fracture Mother 55  ? COPD Father   ? Crohn's disease Sister   ? Cancer Sister   ?     lung  ? Early death Brother 45  ?     house fire  ? Diabetes Maternal Grandmother   ? Hypertension Sister   ? Hypertension Brother   ? Colon cancer Neg Hx   ? ? ?Social History:  reports that she has never smoked. She has never used smokeless tobacco. She reports current alcohol use. She reports that she does not use drugs. ? ?ROS: ?All other review of systems were reviewed and are negative except what is noted above in HPI ? ?Physical Exam: ?BP (!) 172/97   Pulse 82   LMP 01/22/1981 (LMP Unknown)   ?Constitutional:  Alert and oriented, No acute distress. ?HEENT: [place] AT, moist mucus membranes.  Trachea midline, no masses. ?Cardiovascular: No clubbing, cyanosis, or edema. ?Respiratory: Normal respiratory effort, no increased work of breathing. ?GI: Abdomen is soft, nontender, nondistended, no abdominal masses ?GU: No CVA tenderness.  ?Lymph: No cervical or inguinal lymphadenopathy. ?Skin: No rashes, bruises or suspicious lesions. ?Neurologic: Grossly intact, no focal deficits, moving all  4 extremities. ?Psychiatric: Normal mood and affect. ? ?Laboratory Data: ?Lab Results  ?Component Value Date  ? WBC 8.2 05/05/2021  ? HGB 14.5 05/05/2021  ? HCT 43.6 05/05/2021  ? MCV 87 05/05/2021  ? PLT 268 05/05/2021  ? ? ?Lab Results  ?Component Value Date  ? CREATININE 0.97 05/05/2021  ? ? ?No results found for: PSA ? ?No results found for: TESTOSTERONE ? ?Lab Results  ?Component Value Date  ? HGBA1C 7.1 (H) 05/05/2021  ? ? ?Urinalysis ?   ?Component Value Date/Time  ? COLORURINE YELLOW 12/16/2013 1120  ? APPEARANCEUR Clear 06/24/2021 0952  ? LABSPEC 1.013 12/16/2013 1120  ? PHURINE 6.5 12/16/2013 1120  ? GLUCOSEU Negative 06/24/2021 0952  ? HGBUR MODERATE (A)  12/16/2013 1120  ? BILIRUBINUR Negative 06/24/2021 0952  ? KETONESUR NEGATIVE 12/16/2013 1120  ? PROTEINUR 1+ (A) 06/24/2021 0952  ? PROTEINUR 30 (A) 12/16/2013 1120  ? UROBILINOGEN negative 07/24/2014 1519  ? UROBILINOGEN 0.2 12/16/2013 1120  ? NITRITE Negative 06/24/2021 0952  ? NITRITE NEGATIVE 12/16/2013 1120  ? LEUKOCYTESUR 3+ (A) 06/24/2021 0952  ? ? ?Lab Results  ?Component Value Date  ? LABMICR See below: 06/24/2021  ? WBCUA >30 (A) 06/24/2021  ? LABEPIT 0-10 06/24/2021  ? MUCUS Present 06/24/2021  ? BACTERIA Moderate (A) 06/24/2021  ? ? ?Pertinent Imaging: ?KUB today: Images reviewed and discussed with the patient  ?Results for orders placed during the hospital encounter of 06/28/21 ? ?DG Abd 1 View ? ?Narrative ?CLINICAL DATA:  Renal stones ? ?EXAM: ?ABDOMEN - 1 VIEW ? ?COMPARISON:  06/20/2021 ? ?FINDINGS: ?Bowel gas pattern is nonspecific. There is staghorn calculus in the ?right kidney measuring 2.4 cm in maximum diameter. There are ?multiple smaller calculi in both kidneys, more so on the left side. ?Overall, no significant interval changes are noted. ? ?IMPRESSION: ?Multiple bilateral renal stones. ? ? ?Electronically Signed ?By: Elmer Picker M.D. ?On: 06/28/2021 10:58 ? ?No results found for this or any previous visit. ? ?No results  found for this or any previous visit. ? ?No results found for this or any previous visit. ? ?No results found for this or any previous visit. ? ?No results found for this or any previous visit. ? ?Results f

## 2021-07-13 NOTE — Progress Notes (Signed)
? ?07/13/2021 ?2:36 PM  ? ?[name] ?[birth year] ?[id number] ? ?Referring provider: Sharion Balloon, FNP ?[address] ?[place],  [place] [address] ? ?Followup nephrolithiasis ? ? ?HPI: ? ?[name] is a 76yo here for followup for nephrolithiasis. She has passed numerous fragments since her last ESWL. KUB from today shows decreased left renal stone burden. NO LUTS.  ? ?PMH: ?Past Medical History:  ?Diagnosis Date  ? Asthma   ? Chronic systolic heart failure ([place])   ? Echo (08/26/13):  Mild LVH. EF 25% to 30%. Diffuse HK. Aortic valve: There was trivial regurgitation. Mitral valve: There was mild regurgitation. Left atrium: The atrium was mildly dilated.  ? GERD (gastroesophageal reflux disease)   ? Headache(784.0)   ? History of nephrolithiasis   ? Hypertension   ? NICM (nonischemic cardiomyopathy) ([place])   ? a. LHC (08/2013):  no CAD  ? Sleep apnea   ? does not use machine  ? Stroke [facility])   ? Type II or unspecified type diabetes mellitus without mention of complication, uncontrolled   ? borderline  ? ? ?Surgical History: ?Past Surgical History:  ?Procedure Laterality Date  ? ABDOMINAL HYSTERECTOMY    ? COLONOSCOPY WITH PROPOFOL N/A 07/01/2019  ? Procedure: COLONOSCOPY WITH PROPOFOL;  Surgeon: Danie Binder, MD;  [facility];  Service: Endoscopy;  Laterality: N/A;  2:15pm - pt will not move up  ? EXTRACORPOREAL SHOCK WAVE LITHOTRIPSY Left 05/31/2021  ? Procedure: EXTRACORPOREAL SHOCK WAVE LITHOTRIPSY (ESWL);  Surgeon: Cleon Gustin, MD;  Location: [facility];  Service: Urology;  Laterality: Left;  ? EXTRACORPOREAL SHOCK WAVE LITHOTRIPSY Left 06/28/2021  ? Procedure: EXTRACORPOREAL SHOCK WAVE LITHOTRIPSY (ESWL);  Surgeon: Cleon Gustin, MD;  Location: [facility];  Service: Urology;  Laterality: Left;  ? LEFT AND RIGHT HEART CATHETERIZATION WITH CORONARY ANGIOGRAM N/A 09/03/2013  ? Procedure: LEFT AND RIGHT HEART CATHETERIZATION WITH CORONARY ANGIOGRAM;  Surgeon: [name], MD;   Location: [facility] CATH LAB;  Service: Cardiovascular;  Laterality: N/A;  ? OOPHORECTOMY    ? removed 1 ovary and 1 ovary remains.  ? POLYPECTOMY  07/01/2019  ? Procedure: POLYPECTOMY;  Surgeon: Danie Binder, MD;  [facility];  Service: Endoscopy;;  ? ureteral extraction of kidney stone    ? ? ?Home Medications:  ?Allergies as of 07/13/2021   ? ?   Reactions  ? Levaquin [levofloxacin In D5w] Diarrhea  ? Penicillins Rash  ? ?  ? ?  ?Medication List  ?  ? ?  ? Accurate as of July 13, 2021  2:36 PM. If you have any questions, ask your nurse or doctor.  ?  ?  ? ?  ? ?albuterol 108 (90 Base) MCG/ACT inhaler ?Commonly known as: VENTOLIN HFA ?Inhale 2 puffs into the lungs every 6 (six) hours as needed for wheezing or shortness of breath. ?  ?alendronate 70 MG tablet ?Commonly known as: Fosamax ?Take 1 tablet (70 mg total) by mouth every 7 (seven) days. Take with a full glass of water on an empty stomach. ?  ?amLODipine 10 MG tablet ?Commonly known as: NORVASC ?Take 1 tablet (10 mg total) by mouth daily. ?  ?bisoprolol 10 MG tablet ?Commonly known as: ZEBETA ?Take 1 tablet (10 mg total) by mouth daily. ?  ?Blood Pressure Kit Devi ?1 Device by Does not apply route 2 (two) times daily. ?  ?diclofenac 75 MG EC tablet ?Commonly known as: VOLTAREN ?Take 1 tablet (75 mg total) by  mouth 2 (two) times daily. ?  ?diclofenac Sodium 1 % Gel ?Commonly known as: Voltaren ?Apply 2 g topically 4 (four) times daily. ?  ?Entresto 97-103 MG ?Generic drug: sacubitril-valsartan ?Take 1 tablet by mouth twice daily ?  ?glucose blood test strip ?Commonly known as: OneTouch Verio ?Use as instructed ?  ?HYDROcodone-acetaminophen 5-325 MG tablet ?Commonly known as: Norco ?Take 1 tablet by mouth every 6 (six) hours as needed for moderate pain. ?  ?Moderna COVID-19 Bival Booster 50 MCG/0.5ML injection ?Generic drug: COVID-19 mRNA bivalent vaccine (Moderna) ?  ?OneTouch Delica Lancets Fine Misc ?Use to check BG once daily ?  ?oxybutynin 10 MG 24  hr tablet ?Commonly known as: Ditropan XL ?Take 1 tablet (10 mg total) by mouth daily. ?  ?rosuvastatin 10 MG tablet ?Commonly known as: Crestor ?Take 1 tablet (10 mg total) by mouth daily. ?  ?sulfamethoxazole-trimethoprim 800-160 MG tablet ?Commonly known as: BACTRIM DS ?Take 1 tablet by mouth 2 (two) times daily. ?  ?tamsulosin 0.4 MG Caps capsule ?Commonly known as: Flomax ?Take 1 capsule (0.4 mg total) by mouth daily after supper. ?  ?Vitamin D (Ergocalciferol) 1.25 MG (50000 UNIT) Caps capsule ?Commonly known as: DRISDOL ?Take 1 capsule (50,000 Units total) by mouth every 7 (seven) days. ?  ? ?  ? ? ?Allergies:  ?Allergies  ?Allergen Reactions  ? Levaquin [Levofloxacin In D5w] Diarrhea  ? Penicillins Rash  ? ? ?Family History: ?Family History  ?Problem Relation Age of Onset  ? Hypertension Mother   ? Arthritis Mother   ? CAD Mother 67  ? Hip fracture Mother 17  ? COPD Father   ? Crohn's disease Sister   ? Cancer Sister   ?     lung  ? Early death Brother 64  ?     house fire  ? Diabetes Maternal Grandmother   ? Hypertension Sister   ? Hypertension Brother   ? Colon cancer Neg Hx   ? ? ?Social History:  reports that she has never smoked. She has never used smokeless tobacco. She reports current alcohol use. She reports that she does not use drugs. ? ?ROS: ?All other review of systems were reviewed and are negative except what is noted above in HPI ? ?Physical Exam: ?BP (!) 172/97   Pulse 82   LMP 01/22/1981 (LMP Unknown)   ?Constitutional:  Alert and oriented, No acute distress. ?HEENT: [place] AT, moist mucus membranes.  Trachea midline, no masses. ?Cardiovascular: No clubbing, cyanosis, or edema. ?Respiratory: Normal respiratory effort, no increased work of breathing. ?GI: Abdomen is soft, nontender, nondistended, no abdominal masses ?GU: No CVA tenderness.  ?Lymph: No cervical or inguinal lymphadenopathy. ?Skin: No rashes, bruises or suspicious lesions. ?Neurologic: Grossly intact, no focal deficits, moving all  4 extremities. ?Psychiatric: Normal mood and affect. ? ?Laboratory Data: ?Lab Results  ?Component Value Date  ? WBC 8.2 05/05/2021  ? HGB 14.5 05/05/2021  ? HCT 43.6 05/05/2021  ? MCV 87 05/05/2021  ? PLT 268 05/05/2021  ? ? ?Lab Results  ?Component Value Date  ? CREATININE 0.97 05/05/2021  ? ? ?No results found for: PSA ? ?No results found for: TESTOSTERONE ? ?Lab Results  ?Component Value Date  ? HGBA1C 7.1 (H) 05/05/2021  ? ? ?Urinalysis ?   ?Component Value Date/Time  ? COLORURINE YELLOW 12/16/2013 1120  ? APPEARANCEUR Clear 06/24/2021 0952  ? LABSPEC 1.013 12/16/2013 1120  ? PHURINE 6.5 12/16/2013 1120  ? GLUCOSEU Negative 06/24/2021 0952  ? HGBUR MODERATE (A)  12/16/2013 1120  ? BILIRUBINUR Negative 06/24/2021 0952  ? KETONESUR NEGATIVE 12/16/2013 1120  ? PROTEINUR 1+ (A) 06/24/2021 0952  ? PROTEINUR 30 (A) 12/16/2013 1120  ? UROBILINOGEN negative 07/24/2014 1519  ? UROBILINOGEN 0.2 12/16/2013 1120  ? NITRITE Negative 06/24/2021 0952  ? NITRITE NEGATIVE 12/16/2013 1120  ? LEUKOCYTESUR 3+ (A) 06/24/2021 0952  ? ? ?Lab Results  ?Component Value Date  ? LABMICR See below: 06/24/2021  ? WBCUA >30 (A) 06/24/2021  ? LABEPIT 0-10 06/24/2021  ? MUCUS Present 06/24/2021  ? BACTERIA Moderate (A) 06/24/2021  ? ? ?Pertinent Imaging: ?KUB today: Images reviewed and discussed with the patient  ?Results for orders placed during the hospital encounter of 06/28/21 ? ?DG Abd 1 View ? ?Narrative ?CLINICAL DATA:  Renal stones ? ?EXAM: ?ABDOMEN - 1 VIEW ? ?COMPARISON:  06/20/2021 ? ?FINDINGS: ?Bowel gas pattern is nonspecific. There is staghorn calculus in the ?right kidney measuring 2.4 cm in maximum diameter. There are ?multiple smaller calculi in both kidneys, more so on the left side. ?Overall, no significant interval changes are noted. ? ?IMPRESSION: ?Multiple bilateral renal stones. ? ? ?Electronically Signed ?By: Elmer Picker M.D. ?On: 06/28/2021 10:58 ? ?No results found for this or any previous visit. ? ?No results  found for this or any previous visit. ? ?No results found for this or any previous visit. ? ?No results found for this or any previous visit. ? ?No results found for this or any previous visit. ? ?Results f

## 2021-07-14 ENCOUNTER — Telehealth: Payer: Self-pay

## 2021-07-14 NOTE — Telephone Encounter (Signed)
I spoke with [name]. We have discussed possible surgery dates and 08/11/2021 was agreed upon by all parties. Patient given information about surgery date, what to expect pre-operatively and post operatively.  ?  ?We discussed that a pre-op nurse will be calling to set up the pre-op visit that will take place prior to surgery. Informed patient that our office will communicate any additional care to be provided after surgery.  ?  ?Patients questions or concerns were discussed during our call. Advised to call our office should there be any additional information, questions or concerns that arise. Patient verbalized understanding.   ?

## 2021-08-04 NOTE — Chronic Care Management (AMB) (Signed)
?  Care Management  ? ?Note ? ?08/04/2021 ?Name: [name] MRN: [id number] DOB: Dec 10, 1945 ? ?[name] is a 76 y.o. year old female who is a primary care patient of Sharion Balloon, FNP. I reached out to Christella Noa by phone today offer care coordination services.  ? ?[name] was given information about care management services today including:  ?Care management services include personalized support from designated clinical staff supervised by her physician, including individualized plan of care and coordination with other care providers ?24/7 contact phone numbers for assistance for urgent and routine care needs. ?The patient may stop care management services at any time by phone call to the office staff. ? ?Patient did not agree to enrollment in care management services and does not wish to consider at this time. ? ?Follow up plan: ?Telephone appointment with care management team member scheduled for:08/26/2021 ? ?Noreene Larsson, RMA ?Care Guide, Embedded Care Coordination ?[facility]  ?[place],  [address] ?Direct Dial: [phone] ?Museum/gallery [email or web address] ?Website: [email or web address]  ? ?

## 2021-08-08 ENCOUNTER — Encounter (HOSPITAL_COMMUNITY): Payer: Self-pay

## 2021-08-08 ENCOUNTER — Encounter (HOSPITAL_COMMUNITY)
Admission: RE | Admit: 2021-08-08 | Discharge: 2021-08-08 | Disposition: A | Discharge status: Home / Self Care | Payer: Medicare HMO | Source: Ambulatory Visit | Attending: Urology | Admitting: Urology

## 2021-08-08 VITALS — BP 192/68 | HR 54 | Temp 97.8°F | Resp 18 | Ht 64.0 in | Wt 151.0 lb

## 2021-08-08 DIAGNOSIS — E119 Type 2 diabetes mellitus without complications: Secondary | ICD-10-CM | POA: Diagnosis not present

## 2021-08-08 DIAGNOSIS — Z01818 Encounter for other preprocedural examination: Secondary | ICD-10-CM | POA: Diagnosis not present

## 2021-08-08 DIAGNOSIS — E1169 Type 2 diabetes mellitus with other specified complication: Secondary | ICD-10-CM

## 2021-08-08 LAB — BASIC METABOLIC PANEL
Anion gap: 6 (ref 5–15)
BUN: 33 mg/dL — ABNORMAL HIGH (ref 8–23)
CO2: 24 mmol/L (ref 22–32)
Calcium: 9.2 mg/dL (ref 8.9–10.3)
Chloride: 113 mmol/L — ABNORMAL HIGH (ref 98–111)
Creatinine, Ser: 0.9 mg/dL (ref 0.44–1.00)
GFR, Estimated: >60 mL/min (ref >60)
Glucose, Bld: 187 mg/dL — ABNORMAL HIGH (ref 70–99)
Potassium: 3.7 mmol/L (ref 3.5–5.1)
Sodium: 143 mmol/L (ref 135–145)

## 2021-08-08 LAB — HEMOGLOBIN A1C
Hgb A1c MFr Bld: 6.9 % — ABNORMAL HIGH (ref 4.8–5.6)
Mean Plasma Glucose: 151.33 mg/dL

## 2021-08-08 NOTE — Patient Instructions (Signed)
? ? ? ? ? ? [name] ? 08/08/2021  ?  ? '@PREFPERIOPPHARMACY'$ @ ? ? Your procedure is scheduled on  08/11/2021. ? ? Report to [facility] at  0930  A.M. ? ? Call this number if you have problems the morning of surgery: ? [phone] ? ? Remember: ? Do not eat or drink after midnight. ?  ? ?  Use your inhaler before you come and bring your rescue inhaler with you. ? ?   DO NOT take any medications for diabetes the morning of your procedure. ?  ? Take these medicines the morning of surgery with A SIP OF WATER  ? ?      amlodipine, bisoprolol, hydrocodone(if needed), entresto, flomax. ?  ? ? Do not wear jewelry, make-up or nail polish. ? Do not wear lotions, powders, or perfumes, or deodorant. ? Do not shave 48 hours prior to surgery.  Men may shave face and neck. ? Do not bring valuables to the hospital. ? [facility] is not responsible for any belongings or valuables. ? ?Contacts, dentures or bridgework may not be worn into surgery.  Leave your suitcase in the car.  After surgery it may be brought to your room. ? ?For patients admitted to the hospital, discharge time will be determined by your treatment team. ? ?Patients discharged the day of surgery will not be allowed to drive home and must have someone with them for 24 hours.  ? ? ?Special instructions:   DO NOT smoke tobacco or vape fore 24 hours before your procedure. ? ?Please read over the following fact sheets that you were given. ?Coughing and Deep Breathing, Surgical Site Infection Prevention, Anesthesia Post-op Instructions, and Care and Recovery After Surgery ?  ? ? ? Ureteral Stent Implantation, Care After ?This sheet gives you information about how to care for yourself after your procedure. Your health care provider may also give you more specific instructions. If you have problems or questions, contact your health care provider. ?What can I expect after the procedure? ?After the procedure, it is common to have: ?Nausea. ?Mild pain when you urinate.  You may feel this pain in your lower back or lower abdomen. The pain should stop within a few minutes after you urinate. This may last for up to 1 week. ?A small amount of blood in your urine for several days. ?Follow these instructions at home: ?Medicines ?Take over-the-counter and prescription medicines only as told by your health care provider. ?If you were prescribed an antibiotic medicine, take it as told by your health care provider. Do not stop taking the antibiotic even if you start to feel better. ?Do not drive for 24 hours if you were given a sedative during your procedure. ?Ask your health care provider if the medicine prescribed to you requires you to avoid driving or using heavy machinery. ?Activity ?Rest as told by your health care provider. ?Avoid sitting for a long time without moving. Get up to take short walks every 1-2 hours. This is important to improve blood flow and breathing. Ask for help if you feel weak or unsteady. ?Return to your normal activities as told by your health care provider. Ask your health care provider what activities are safe for you. ?General instructions ? ?Watch for any blood in your urine. Call your health care provider if the amount of blood in your urine increases. ?If you have a catheter: ?Follow instructions from your health care provider about taking care of your catheter and  collection bag. ?Do not take baths, swim, or use a hot tub until your health care provider approves. Ask your health care provider if you may take showers. You may only be allowed to take sponge baths. ?Drink enough fluid to keep your urine pale yellow. ?Do not use any products that contain nicotine or tobacco, such as cigarettes, e-cigarettes, and chewing tobacco. These can delay healing after surgery. If you need help quitting, ask your health care provider. ?Keep all follow-up visits as told by your health care provider. This is important. ?Contact a health care provider if: ?You have pain  that gets worse or does not get better with medicine, especially pain when you urinate. ?You have difficulty urinating. ?You feel nauseous or you vomit repeatedly during a period of more than 2 days after the procedure. ?Get help right away if: ?Your urine is dark red or has blood clots in it. ?You are leaking urine (have incontinence). ?The end of the stent comes out of your urethra. ?You cannot urinate. ?You have sudden, sharp, or severe pain in your abdomen or lower back. ?You have a fever. ?You have swelling or pain in your legs. ?You have difficulty breathing. ?Summary ?After the procedure, it is common to have mild pain when you urinate that goes away within a few minutes after you urinate. This may last for up to 1 week. ?Watch for any blood in your urine. Call your health care provider if the amount of blood in your urine increases. ?Take over-the-counter and prescription medicines only as told by your health care provider. ?Drink enough fluid to keep your urine pale yellow. ?This information is not intended to replace advice given to you by your health care provider. Make sure you discuss any questions you have with your health care provider. ?Document Revised: 02/21/2021 Document Reviewed: 01/31/2021 ?Elsevier Patient Education ? [employer]. ?General Anesthesia, Adult, Care After ?This sheet gives you information about how to care for yourself after your procedure. Your health care provider may also give you more specific instructions. If you have problems or questions, contact your health care provider. ?What can I expect after the procedure? ?After the procedure, the following side effects are common: ?Pain or discomfort at the IV site. ?Nausea. ?Vomiting. ?Sore throat. ?Trouble concentrating. ?Feeling cold or chills. ?Feeling weak or tired. ?Sleepiness and fatigue. ?Soreness and body aches. These side effects can affect parts of the body that were not involved in surgery. ?Follow these  instructions at home: ?For the time period you were told by your health care provider: ? ?Rest. ?Do not participate in activities where you could fall or become injured. ?Do not drive or use machinery. ?Do not drink alcohol. ?Do not take sleeping pills or medicines that cause drowsiness. ?Do not make important decisions or sign legal documents. ?Do not take care of children on your own. ?Eating and drinking ?Follow any instructions from your health care provider about eating or drinking restrictions. ?When you feel hungry, start by eating small amounts of foods that are soft and easy to digest (bland), such as toast. Gradually return to your regular diet. ?Drink enough fluid to keep your urine pale yellow. ?If you vomit, rehydrate by drinking water, juice, or clear broth. ?General instructions ?If you have sleep apnea, surgery and certain medicines can increase your risk for breathing problems. Follow instructions from your health care provider about wearing your sleep device: ?Anytime you are sleeping, including during daytime naps. ?While taking prescription pain medicines, sleeping medicines,  or medicines that make you drowsy. ?Have a responsible adult stay with you for the time you are told. It is important to have someone help care for you until you are awake and alert. ?Return to your normal activities as told by your health care provider. Ask your health care provider what activities are safe for you. ?Take over-the-counter and prescription medicines only as told by your health care provider. ?If you smoke, do not smoke without supervision. ?Keep all follow-up visits as told by your health care provider. This is important. ?Contact a health care provider if: ?You have nausea or vomiting that does not get better with medicine. ?You cannot eat or drink without vomiting. ?You have pain that does not get better with medicine. ?You are unable to pass urine. ?You develop a skin rash. ?You have a fever. ?You have  redness around your IV site that gets worse. ?Get help right away if: ?You have difficulty breathing. ?You have chest pain. ?You have blood in your urine or stool, or you vomit blood. ?Summary ?After the procedure, i

## 2021-08-11 ENCOUNTER — Ambulatory Visit (HOSPITAL_BASED_OUTPATIENT_CLINIC_OR_DEPARTMENT_OTHER): Payer: Medicare HMO | Admitting: Certified Registered"

## 2021-08-11 ENCOUNTER — Ambulatory Visit (HOSPITAL_COMMUNITY)
Admission: RE | Admit: 2021-08-11 | Discharge: 2021-08-11 | Disposition: A | Discharge status: Home / Self Care | Payer: Medicare HMO | Attending: Urology | Admitting: Urology

## 2021-08-11 ENCOUNTER — Ambulatory Visit (HOSPITAL_COMMUNITY): Payer: Medicare HMO | Admitting: Certified Registered"

## 2021-08-11 ENCOUNTER — Ambulatory Visit (HOSPITAL_COMMUNITY): Payer: Medicare HMO

## 2021-08-11 ENCOUNTER — Other Ambulatory Visit: Payer: Self-pay

## 2021-08-11 ENCOUNTER — Encounter (HOSPITAL_COMMUNITY)
Admission: RE | Disposition: A | Discharge status: Home / Self Care | Payer: Self-pay | Source: Home / Self Care | Attending: Urology

## 2021-08-11 DIAGNOSIS — E119 Type 2 diabetes mellitus without complications: Secondary | ICD-10-CM | POA: Insufficient documentation

## 2021-08-11 DIAGNOSIS — N200 Calculus of kidney: Secondary | ICD-10-CM | POA: Insufficient documentation

## 2021-08-11 DIAGNOSIS — G4730 Sleep apnea, unspecified: Secondary | ICD-10-CM | POA: Diagnosis not present

## 2021-08-11 DIAGNOSIS — J45909 Unspecified asthma, uncomplicated: Secondary | ICD-10-CM | POA: Diagnosis not present

## 2021-08-11 DIAGNOSIS — I5022 Chronic systolic (congestive) heart failure: Secondary | ICD-10-CM | POA: Diagnosis not present

## 2021-08-11 DIAGNOSIS — I10 Essential (primary) hypertension: Secondary | ICD-10-CM | POA: Insufficient documentation

## 2021-08-11 DIAGNOSIS — N132 Hydronephrosis with renal and ureteral calculous obstruction: Secondary | ICD-10-CM | POA: Diagnosis not present

## 2021-08-11 DIAGNOSIS — Z8673 Personal history of transient ischemic attack (TIA), and cerebral infarction without residual deficits: Secondary | ICD-10-CM | POA: Diagnosis not present

## 2021-08-11 DIAGNOSIS — E118 Type 2 diabetes mellitus with unspecified complications: Secondary | ICD-10-CM | POA: Diagnosis not present

## 2021-08-11 DIAGNOSIS — N1330 Unspecified hydronephrosis: Secondary | ICD-10-CM | POA: Diagnosis not present

## 2021-08-11 DIAGNOSIS — I110 Hypertensive heart disease with heart failure: Secondary | ICD-10-CM | POA: Diagnosis not present

## 2021-08-11 HISTORY — PX: CYSTOSCOPY WITH RETROGRADE PYELOGRAM, URETEROSCOPY AND STENT PLACEMENT: SHX5789

## 2021-08-11 HISTORY — PX: HOLMIUM LASER APPLICATION: SHX5852

## 2021-08-11 LAB — GLUCOSE, CAPILLARY
Glucose-Capillary: 154 mg/dL — ABNORMAL HIGH (ref 70–99)
Glucose-Capillary: 148 mg/dL — ABNORMAL HIGH (ref 70–99)

## 2021-08-11 SURGERY — CYSTOURETEROSCOPY, WITH RETROGRADE PYELOGRAM AND STENT INSERTION
Anesthesia: General | Site: Renal | Laterality: Left

## 2021-08-11 MED ORDER — WATER FOR IRRIGATION, STERILE IR SOLN
Status: DC | PRN
Start: 2021-08-11 — End: 2021-08-11
  Administered 2021-08-11: 500 mL

## 2021-08-11 MED ORDER — FENTANYL CITRATE PF 50 MCG/ML IJ SOSY: 25.0000 ug | PREFILLED_SYRINGE | INTRAMUSCULAR | Status: DC | PRN

## 2021-08-11 MED ORDER — FENTANYL CITRATE (PF) 100 MCG/2ML IJ SOLN
INTRAMUSCULAR | Status: AC
  Filled 2021-08-11: qty 2

## 2021-08-11 MED ORDER — FENTANYL CITRATE (PF) 100 MCG/2ML IJ SOLN
INTRAMUSCULAR | Status: DC | PRN
  Administered 2021-08-11: 50 ug via INTRAVENOUS
  Administered 2021-08-11 (×2): 25 ug via INTRAVENOUS

## 2021-08-11 MED ORDER — CEFAZOLIN SODIUM-DEXTROSE 2-4 GM/100ML-% IV SOLN
2.0000 g | INTRAVENOUS | Status: AC
  Administered 2021-08-11: 2 g via INTRAVENOUS

## 2021-08-11 MED ORDER — ONDANSETRON HCL 4 MG/2ML IJ SOLN
INTRAMUSCULAR | Status: AC
  Filled 2021-08-11: qty 2

## 2021-08-11 MED ORDER — CHLORHEXIDINE GLUCONATE 0.12 % MT SOLN: 15.0000 mL | Freq: Once | OROMUCOSAL | Status: DC

## 2021-08-11 MED ORDER — KETOROLAC TROMETHAMINE 30 MG/ML IJ SOLN
INTRAMUSCULAR | Status: DC | PRN
  Administered 2021-08-11: 30 mg via INTRAVENOUS

## 2021-08-11 MED ORDER — DIATRIZOATE MEGLUMINE 30 % UR SOLN
URETHRAL | Status: AC
  Filled 2021-08-11: qty 100

## 2021-08-11 MED ORDER — SODIUM CHLORIDE 0.9 % IR SOLN
Status: DC | PRN
Start: 2021-08-11 — End: 2021-08-11
  Administered 2021-08-11 (×2): 3000 mL

## 2021-08-11 MED ORDER — PROPOFOL 10 MG/ML IV BOLUS
INTRAVENOUS | Status: DC | PRN
  Administered 2021-08-11: 130 mg via INTRAVENOUS
  Administered 2021-08-11: 70 mg via INTRAVENOUS

## 2021-08-11 MED ORDER — ONDANSETRON HCL 4 MG/2ML IJ SOLN
INTRAMUSCULAR | Status: DC | PRN
  Administered 2021-08-11: 4 mg via INTRAVENOUS

## 2021-08-11 MED ORDER — GLYCOPYRROLATE PF 0.2 MG/ML IJ SOSY
PREFILLED_SYRINGE | INTRAMUSCULAR | Status: AC
  Filled 2021-08-11: qty 1

## 2021-08-11 MED ORDER — EPHEDRINE 5 MG/ML INJ
INTRAVENOUS | Status: AC
  Filled 2021-08-11: qty 5

## 2021-08-11 MED ORDER — LACTATED RINGERS IV SOLN: INTRAVENOUS | Status: DC

## 2021-08-11 MED ORDER — LIDOCAINE 2% (20 MG/ML) 5 ML SYRINGE
INTRAMUSCULAR | Status: DC | PRN
Start: 2021-08-11 — End: 2021-08-11
  Administered 2021-08-11: 80 mg via INTRAVENOUS

## 2021-08-11 MED ORDER — LACTATED RINGERS IV SOLN: INTRAVENOUS | Status: DC | PRN

## 2021-08-11 MED ORDER — ORAL CARE MOUTH RINSE: 15.0000 mL | Freq: Once | OROMUCOSAL | Status: DC

## 2021-08-11 MED ORDER — TAMSULOSIN HCL 0.4 MG PO CAPS: 0.4000 mg | ORAL_CAPSULE | Freq: Every day | ORAL | 1 refills | Status: DC

## 2021-08-11 MED ORDER — PHENYLEPHRINE 80 MCG/ML (10ML) SYRINGE FOR IV PUSH (FOR BLOOD PRESSURE SUPPORT)
PREFILLED_SYRINGE | INTRAVENOUS | Status: DC | PRN
  Administered 2021-08-11 (×3): 80 ug via INTRAVENOUS

## 2021-08-11 MED ORDER — EPHEDRINE SULFATE-NACL 50-0.9 MG/10ML-% IV SOSY
PREFILLED_SYRINGE | INTRAVENOUS | Status: DC | PRN
  Administered 2021-08-11 (×2): 5 mg via INTRAVENOUS

## 2021-08-11 MED ORDER — LIDOCAINE HCL (PF) 2 % IJ SOLN
INTRAMUSCULAR | Status: AC
  Filled 2021-08-11: qty 5

## 2021-08-11 MED ORDER — DIATRIZOATE MEGLUMINE 30 % UR SOLN
URETHRAL | Status: DC | PRN
  Administered 2021-08-11: 8 mL via URETHRAL

## 2021-08-11 MED ORDER — KETOROLAC TROMETHAMINE 30 MG/ML IJ SOLN
INTRAMUSCULAR | Status: AC
  Filled 2021-08-11: qty 1

## 2021-08-11 MED ORDER — HYDROCODONE-ACETAMINOPHEN 5-325 MG PO TABS: 1.0000 | ORAL_TABLET | Freq: Four times a day (QID) | ORAL | 0 refills | Status: DC | PRN

## 2021-08-11 MED ORDER — DEXAMETHASONE SODIUM PHOSPHATE 4 MG/ML IJ SOLN
INTRAMUSCULAR | Status: DC | PRN
  Administered 2021-08-11: 5 mg via INTRAVENOUS

## 2021-08-11 MED ORDER — GLYCOPYRROLATE PF 0.2 MG/ML IJ SOSY
PREFILLED_SYRINGE | INTRAMUSCULAR | Status: DC | PRN
Start: 2021-08-11 — End: 2021-08-11
  Administered 2021-08-11 (×2): .1 mg via INTRAVENOUS

## 2021-08-11 SURGICAL SUPPLY — 27 items
BAG DRAIN URO TABLE W/ADPT NS (BAG) ×2 IMPLANT
BAG DRN 8 ADPR NS SKTRN CSTL (BAG) ×1
BAG HAMPER (MISCELLANEOUS) ×2 IMPLANT
CATH INTERMIT  6FR 70CM (CATHETERS) ×2 IMPLANT
CLOTH BEACON ORANGE TIMEOUT ST (SAFETY) ×2 IMPLANT
DECANTER SPIKE VIAL GLASS SM (MISCELLANEOUS) ×2 IMPLANT
EXTRACTOR STONE NITINOL NGAGE (UROLOGICAL SUPPLIES) ×3 IMPLANT
GLOVE BIO SURGEON STRL SZ8 (GLOVE) ×2 IMPLANT
GLOVE BIOGEL PI IND STRL 7.0 (GLOVE) ×2 IMPLANT
GLOVE BIOGEL PI INDICATOR 7.0 (GLOVE) ×2
GOWN STRL REUS W/TWL LRG LVL3 (GOWN DISPOSABLE) ×2 IMPLANT
GOWN STRL REUS W/TWL XL LVL3 (GOWN DISPOSABLE) ×2 IMPLANT
GUIDEWIRE STR DUAL SENSOR (WIRE) ×2 IMPLANT
GUIDEWIRE STR ZIPWIRE 035X150 (MISCELLANEOUS) ×2 IMPLANT
IV NS IRRIG 3000ML ARTHROMATIC (IV SOLUTION) ×4 IMPLANT
KIT TURNOVER CYSTO (KITS) ×2 IMPLANT
MANIFOLD NEPTUNE II (INSTRUMENTS) ×2 IMPLANT
PACK CYSTO (CUSTOM PROCEDURE TRAY) ×2 IMPLANT
PAD ARMBOARD 7.5X6 YLW CONV (MISCELLANEOUS) ×2 IMPLANT
SHEATH URETERAL 12FRX35CM (MISCELLANEOUS) ×1 IMPLANT
STENT URET 6FRX24 CONTOUR (STENTS) ×1 IMPLANT
SYR 10ML LL (SYRINGE) ×2 IMPLANT
SYR CONTROL 10ML LL (SYRINGE) ×2 IMPLANT
TOWEL OR 17X26 4PK STRL BLUE (TOWEL DISPOSABLE) ×2 IMPLANT
TRACTIP FLEXIVA PULS ID 200XHI (Laser) IMPLANT
TRACTIP FLEXIVA PULSE ID 200 (Laser) ×2
WATER STERILE IRR 500ML POUR (IV SOLUTION) ×2 IMPLANT

## 2021-08-11 NOTE — Anesthesia Postprocedure Evaluation (Signed)
Anesthesia Post Note ? ?Patient: [name] ? ?Procedure(s) Performed: CYSTOSCOPY WITH RETROGRADE PYELOGRAM, URETEROSCOPY AND STENT PLACEMENT (Left: Renal) ?HOLMIUM LASER APPLICATION (Left: Renal) ? ?Patient location during evaluation: Phase II ?Anesthesia Type: General ?Level of consciousness: awake ?Pain management: pain level controlled ?Vital Signs Assessment: post-procedure vital signs reviewed and stable ?Respiratory status: spontaneous breathing and respiratory function stable ?Cardiovascular status: blood pressure returned to baseline and stable ?Postop Assessment: no headache and no apparent nausea or vomiting ?Anesthetic complications: no ?Comments: Late entry ? ? ?No notable events documented. ? ? ?Last Vitals:  ?Vitals:  ? 08/11/21 1330 08/11/21 1345  ?BP: (!) 152/63 (!) 149/82  ?Pulse: 65 62  ?Resp: 10 18  ?Temp:  36.6 ?C  ?SpO2: 99% 98%  ?  ?Last Pain:  ?Vitals:  ? 08/11/21 1345  ?TempSrc: Oral  ?PainSc: 0-No pain  ? ? ?  ?  ?  ?  ?  ?  ? ?Louann Sjogren ? ? ? ? ?

## 2021-08-11 NOTE — Anesthesia Procedure Notes (Signed)
Procedure Name: LMA Insertion ?Date/Time: 08/11/2021 11:48 AM ?Performed by: Orlie Dakin, CRNA ?Pre-anesthesia Checklist: Patient identified, Emergency Drugs available, Suction available and Patient being monitored ?Patient Re-evaluated:Patient Re-evaluated prior to induction ?Oxygen Delivery Method: Circle system utilized ?Preoxygenation: Pre-oxygenation with 100% oxygen ?Induction Type: IV induction ?LMA: LMA inserted ?LMA Size: 4.0 ?Tube type: Oral ?Number of attempts: 1 ?Placement Confirmation: positive ETCO2 ? ? ? ? ?

## 2021-08-11 NOTE — Op Note (Signed)
.  Preoperative diagnosis: Left renal calculi ? ?Postoperative diagnosis: Same ? ?Procedure: 1 cystoscopy ?2. Left retrograde pyelography ?3.  Intraoperative fluoroscopy, under one hour, with interpretation ?4.  Left ureteroscopic stone manipulation with laser lithotripsy ?5.  Left 6 x 26 JJ stent placement ? ?Attending: Rosie Fate ? ?Anesthesia: General ? ?Estimated blood loss: None ? ?Drains: Left 6 x 26 JJ ureteral stent without tether ? ?Specimens: stone for analysis ? ?Antibiotics: ancef ? ?Findings: left UPJ and mid/lower pole stone. mild hydronephrosis. No masses/lesions in the bladder. Ureteral orifices in normal anatomic location. ? ?Indications: Patient is a 76 year old female with a history of left renal stone and who has failed to pass the fragments following ESWL.  After discussing treatment options, she decided proceed with left ureteroscopic stone manipulation. ? ?Procedure in detail: The patient was brought to the operating room and a brief timeout was done to ensure correct patient, correct procedure, correct site.  General anesthesia was administered patient was placed in dorsal lithotomy position.  Her genitalia was then prepped and draped in usual sterile fashion.  A rigid 78 French cystoscope was passed in the urethra and the bladder.  Bladder was inspected free masses or lesions.  the ureteral orifices were in the normal orthotopic locations.  a 6 french ureteral catheter was then instilled into the left ureteral orifice.  a gentle retrograde was obtained and findings noted above.  we then placed a zip wire through the ureteral catheter and advanced up to the renal pelvis.  we then removed the cystoscope and cannulated the left ureteral orifice with a semirigid ureteroscope.  No stone was found in the ureter. Once we reached the UPJ a sensor wire was advanced in to the renal pelvis. We then removed the ureteroscope and advanced am 12/14 x 38cm access sheath up to the renal pelvis. We then  used the flexible ureteroscope to perform nephroscopy. We encountered the stones at the UPJ and in the mid/lower pole. Using a 242nm laser fiber the stones were fragments   the fragments were then removed with a Ngage basket.  we then removed the access sheath under direct vision and noted no injury to the ureter. We then placed a 6 x 26 double-j ureteral stent over the original zip wire.  We then removed the wire and good coil was noted in the the renal pelvis under fluoroscopy and the bladder under direct vision. the bladder was then drained and this concluded the procedure which was well tolerated by patient. ? ?Complications: None ? ?Condition: Stable, extubated, transferred to PACU ? ?Plan: Patient is to be discharged home as to follow-up in 2 weeks for repeat ureteroscopic stone extraction ?

## 2021-08-11 NOTE — Transfer of Care (Signed)
Immediate Anesthesia Transfer of Care Note ? ?Patient: [name] ? ?Procedure(s) Performed: CYSTOSCOPY WITH RETROGRADE PYELOGRAM, URETEROSCOPY AND STENT PLACEMENT (Left: Renal) ?HOLMIUM LASER APPLICATION (Left: Renal) ? ?Patient Location: PACU ? ?Anesthesia Type:General ? ?Level of Consciousness: drowsy ? ?Airway & Oxygen Therapy: Patient Spontanous Breathing and Patient connected to face mask oxygen ? ?Post-op Assessment: Report given to RN and Post -op Vital signs reviewed and stable ? ?Post vital signs: Reviewed and stable ? ?Last Vitals:  ?Vitals Value Taken Time  ?BP    ?Temp    ?Pulse 59 08/11/21 1303  ?Resp    ?SpO2 99 % 08/11/21 1303  ?Vitals shown include unvalidated device data. ? ?Last Pain:  ?Vitals:  ? 08/11/21 1029  ?TempSrc: Oral  ?PainSc: 0-No pain  ?   ? ?Patients Stated Pain Goal: 7 (08/11/21 1029) ? ?Complications: No notable events documented. ?

## 2021-08-11 NOTE — Anesthesia Preprocedure Evaluation (Signed)
Anesthesia Evaluation  Patient identified by MRN, date of birth, ID band Patient awake    Reviewed: Allergy & Precautions, H&P , NPO status , Patient's Chart, lab work & pertinent test results, reviewed documented beta blocker date and time   Airway Mallampati: II  TM Distance: >3 FB Neck ROM: full    Dental no notable dental hx.    Pulmonary asthma , sleep apnea ,    Pulmonary exam normal breath sounds clear to auscultation       Cardiovascular Exercise Tolerance: Good hypertension, negative cardio ROS   Rhythm:regular Rate:Normal     Neuro/Psych  Headaches,  Neuromuscular disease CVA negative psych ROS   GI/Hepatic Neg liver ROS, GERD  ,  Endo/Other  negative endocrine ROSdiabetes, Type 2  Renal/GU negative Renal ROS  negative genitourinary   Musculoskeletal   Abdominal   Peds  Hematology negative hematology ROS (+)   Anesthesia Other Findings 1. Left ventricular ejection fraction, by visual estimation, is 60 to  65%. The left ventricle has normal function. There is mildly increased  left ventricular hypertrophy.  2. Elevated left ventricular end-diastolic pressure.  3. Left ventricular diastolic parameters are consistent with Grade I  diastolic dysfunction (impaired relaxation).  4. The left ventricle has no regional wall motion abnormalities.  5. Global right ventricle has normal systolic function.The right  ventricular size is normal. No increase in right ventricular wall  thickness.  6. Left atrial size was normal.  7. Right atrial size was normal.  8. The mitral valve is grossly normal. Mild mitral valve regurgitation.  9. The tricuspid valve is grossly normal. Tricuspid valve regurgitation  is trivial.  10. The aortic valve is tricuspid. Aortic valve regurgitation is not  visualized.  11. The pulmonic valve was grossly normal. Pulmonic valve regurgitation is  not visualized.  12. The  inferior vena cava is normal in size with greater than 50%  respiratory variability, suggesting right atrial pressure of 3 mmHg.   Reproductive/Obstetrics negative OB ROS                             Anesthesia Physical  Anesthesia Plan  ASA: 3  Anesthesia Plan: General and General LMA   Post-op Pain Management:    Induction:   PONV Risk Score and Plan: Ondansetron  Airway Management Planned:   Additional Equipment:   Intra-op Plan:   Post-operative Plan:   Informed Consent: I have reviewed the patients History and Physical, chart, labs and discussed the procedure including the risks, benefits and alternatives for the proposed anesthesia with the patient or authorized representative who has indicated his/her understanding and acceptance.     Dental Advisory Given  Plan Discussed with: CRNA  Anesthesia Plan Comments:         Anesthesia Quick Evaluation  

## 2021-08-11 NOTE — Interval H&P Note (Signed)
History and Physical Interval Note: ? ?08/11/2021 ?11:27 AM ? ?[name]  has presented today for surgery, with the diagnosis of left renal calculus.  The various methods of treatment have been discussed with the patient and family. After consideration of risks, benefits and other options for treatment, the patient has consented to  Procedure(s): ?CYSTOSCOPY WITH RETROGRADE PYELOGRAM, URETEROSCOPY AND STENT PLACEMENT (Left) ?HOLMIUM LASER APPLICATION (Left) as a surgical intervention.  The patient's history has been reviewed, patient examined, no change in status, stable for surgery.  I have reviewed the patient's chart and labs.  Questions were answered to the patient's satisfaction.   ? ? ?Nicolette Bang ? ? ?

## 2021-08-15 ENCOUNTER — Encounter (HOSPITAL_COMMUNITY): Payer: Self-pay | Admitting: Urology

## 2021-08-16 ENCOUNTER — Telehealth: Payer: Self-pay

## 2021-08-16 NOTE — Telephone Encounter (Signed)
Patient called to set up an apt to have a nurse help her remove her stent.  Returned her call with no answer, left a voicemail informing her that she has an apt tomorrow that she can discuss with Dr. Alyson Ingles at that time.  ?

## 2021-08-17 ENCOUNTER — Ambulatory Visit: Payer: Medicare HMO | Admitting: Urology

## 2021-08-17 ENCOUNTER — Other Ambulatory Visit: Payer: Self-pay

## 2021-08-17 NOTE — Telephone Encounter (Signed)
Patient called to ask for more pain medication due to kidney stones. ? ?Message sent to MD.  ?

## 2021-08-17 NOTE — Telephone Encounter (Signed)
I spoke with Ms. Wittler. We have discussed possible surgery dates and 09/08/2021 was agreed upon by all parties. Patient given information about surgery date, what to expect pre-operatively and post operatively.  ?  ?We discussed that a pre-op nurse will be calling to set up the pre-op visit that will take place prior to surgery. Informed patient that our office will communicate any additional care to be provided after surgery.  ?  ?Patients questions or concerns were discussed during our call. Advised to call our office should there be any additional information, questions or concerns that arise. Patient verbalized understanding.   ?

## 2021-08-18 LAB — CALCULI, WITH PHOTOGRAPH (CLINICAL LAB)
Calcium Oxalate Monohydrate: 100 %
Weight Calculi: 295 mg

## 2021-08-18 MED ORDER — HYDROCODONE-ACETAMINOPHEN 5-325 MG PO TABS: 1.0000 | ORAL_TABLET | Freq: Four times a day (QID) | ORAL | 0 refills | Status: DC | PRN

## 2021-08-26 ENCOUNTER — Telehealth: Payer: Self-pay | Admitting: Pharmacist

## 2021-08-26 ENCOUNTER — Telehealth: Payer: Medicare HMO

## 2021-08-26 NOTE — Telephone Encounter (Signed)
  Care Management   Follow Up Note   08/26/2021 Name: [name] MRN: [id number] DOB: 04-18-1945   Referred by: Sharion Balloon, FNP Reason for referral : Appointment   An unsuccessful telephone outreach was attempted today. The patient was referred to the case management team for assistance with care management and care coordination.   Follow Up Plan: The care management team will reach out to the patient again over the next 5 days.     Regina Eck, PharmD, BCPS Clinical Pharmacist, [facility]  [facility] Phone [phone]

## 2021-08-29 ENCOUNTER — Ambulatory Visit (INDEPENDENT_AMBULATORY_CARE_PROVIDER_SITE_OTHER): Payer: Medicare HMO | Admitting: Urology

## 2021-08-29 VITALS — BP 158/64 | HR 69

## 2021-08-29 DIAGNOSIS — N3000 Acute cystitis without hematuria: Secondary | ICD-10-CM | POA: Diagnosis not present

## 2021-08-29 LAB — URINALYSIS, ROUTINE W REFLEX MICROSCOPIC
Bilirubin, UA: NEGATIVE
Glucose, UA: NEGATIVE
Nitrite, UA: NEGATIVE
Specific Gravity, UA: 1.025 (ref 1.005–1.030)
Urobilinogen, Ur: 0.2 mg/dL (ref 0.2–1.0)
pH, UA: 6 (ref 5.0–7.5)

## 2021-08-29 LAB — MICROSCOPIC EXAMINATION

## 2021-08-29 MED ORDER — DOXYCYCLINE HYCLATE 100 MG PO CAPS: 100.0000 mg | ORAL_CAPSULE | Freq: Two times a day (BID) | ORAL | 0 refills | Status: DC

## 2021-08-30 ENCOUNTER — Telehealth: Payer: Self-pay

## 2021-08-30 NOTE — Progress Notes (Unsigned)
Cardiology Office Note   Date:  08/31/2021   ID:  [name], DOB 02/15/1946, MRN [id number]  PCP:  Sharion Balloon, FNP  Cardiologist:   None  Chief Complaint  Patient presents with   Cardiomyopathy      History of Present Illness: [name] is a 76 y.o. female who presents  for ongoing assessment and management of hypertension, chronic dyspnea, nonischemic cardiomyopathy, with probable OSA.  She was seen 03/12/2019 she had complaints of shortness of breath and was hypertensive.  An echocardiogram was scheduled to evaluate for LV function.  Bisoprolol was decreased to 5 mg in the setting of some mild dizziness.  He also referred her to ENT concerning some complaints of dizziness as she had had cerumen impaction in the past.  Echocardiogram completed on 03/19/2019 revealed a normal LVEF of 60% to 65%.  Grade 1 diastolic dysfunction.  There was no evidence of pulmonary hypertension.  She had no valvular abnormalities.  Since I last saw her she has had no new cardiovascular complaints.  She is currently having problems with kidney stones and has had lithotripsy and is apparently going to have extraction. The patient denies any new symptoms such as chest discomfort, neck or arm discomfort. There has been no new shortness of breath, PND or orthopnea. There have been no reported palpitations, presyncope or syncope.  She does stay active doing yard work.   Past Medical History:  Diagnosis Date   Asthma    Chronic systolic heart failure ([place])    Echo (08/26/13):  Mild LVH. EF 25% to 30%. Diffuse HK. Aortic valve: There was trivial regurgitation. Mitral valve: There was mild regurgitation. Left atrium: The atrium was mildly dilated.   GERD (gastroesophageal reflux disease)    Headache(784.0)    History of nephrolithiasis    Hypertension    NICM (nonischemic cardiomyopathy) ([place])    a. LHC (08/2013):  no CAD   Sleep apnea    does not use machine   Stroke ([place])    Type II or  unspecified type diabetes mellitus without mention of complication, uncontrolled    borderline    Past Surgical History:  Procedure Laterality Date   ABDOMINAL HYSTERECTOMY     COLONOSCOPY WITH PROPOFOL N/A 07/01/2019   Procedure: COLONOSCOPY WITH PROPOFOL;  Surgeon: Danie Binder, MD;  [facility];  Service: Endoscopy;  Laterality: N/A;  2:15pm - pt will not move up   [place], URETEROSCOPY AND STENT PLACEMENT Left 08/11/2021   Procedure: [place], URETEROSCOPY AND STENT PLACEMENT;  Surgeon: Cleon Gustin, MD;  Location: [facility];  Service: Urology;  Laterality: Left;   EXTRACORPOREAL SHOCK WAVE LITHOTRIPSY Left 05/31/2021   Procedure: EXTRACORPOREAL SHOCK WAVE LITHOTRIPSY (ESWL);  Surgeon: Cleon Gustin, MD;  Location: [facility];  Service: Urology;  Laterality: Left;   EXTRACORPOREAL SHOCK WAVE LITHOTRIPSY Left 06/28/2021   Procedure: EXTRACORPOREAL SHOCK WAVE LITHOTRIPSY (ESWL);  Surgeon: Cleon Gustin, MD;  Location: [facility];  Service: Urology;  Laterality: Left;   HOLMIUM LASER APPLICATION Left 08/13/9739   Procedure: HOLMIUM LASER APPLICATION;  Surgeon: Cleon Gustin, MD;  Location: [facility];  Service: Urology;  Laterality: Left;   LEFT AND RIGHT HEART CATHETERIZATION WITH CORONARY ANGIOGRAM N/A 09/03/2013   Procedure: LEFT AND RIGHT HEART CATHETERIZATION WITH CORONARY ANGIOGRAM;  Surgeon: [name], MD;  Location: [facility] CATH LAB;  Service: Cardiovascular;  Laterality: N/A;   OOPHORECTOMY  removed 1 ovary and 1 ovary remains.   POLYPECTOMY  07/01/2019   Procedure: POLYPECTOMY;  Surgeon: Danie Binder, MD;  [facility];  Service: Endoscopy;;   ureteral extraction of kidney stone       Current Outpatient Medications  Medication Sig Dispense Refill   acetaminophen (TYLENOL) 500 MG tablet Take 1,000 mg by mouth every 6 (six) hours as needed for moderate pain or headache.      albuterol (VENTOLIN HFA) 108 (90 Base) MCG/ACT inhaler Inhale 2 puffs into the lungs every 6 (six) hours as needed for wheezing or shortness of breath. 18 g 0   amLODipine (NORVASC) 10 MG tablet Take 1 tablet (10 mg total) by mouth daily. 90 tablet 2   doxycycline (VIBRAMYCIN) 100 MG capsule Take 1 capsule (100 mg total) by mouth every 12 (twelve) hours. 14 capsule 0   glucose blood (ONETOUCH VERIO) test strip Use as instructed 100 each 12   HYDROcodone-acetaminophen (NORCO) 5-325 MG tablet Take 1 tablet by mouth every 6 (six) hours as needed for moderate pain. 30 tablet 0   ONETOUCH DELICA LANCETS FINE MISC Use to check BG once daily 100 each 2   sacubitril-valsartan (ENTRESTO) 97-103 MG Take 1 tablet by mouth twice daily 90 tablet 3   Vitamin D, Ergocalciferol, (DRISDOL) 1.25 MG (50000 UNIT) CAPS capsule Take 1 capsule (50,000 Units total) by mouth every 7 (seven) days. 12 capsule 3   alendronate (FOSAMAX) 70 MG tablet Take 1 tablet (70 mg total) by mouth every 7 (seven) days. Take with a full glass of water on an empty stomach. (Patient not taking: Reported on 08/03/2021) 4 tablet 11   bisoprolol (ZEBETA) 10 MG tablet Take 1 tablet (10 mg total) by mouth daily. (Patient not taking: Reported on 08/31/2021) 90 tablet 1   diclofenac (VOLTAREN) 75 MG EC tablet Take 1 tablet (75 mg total) by mouth 2 (two) times daily. (Patient not taking: Reported on 08/03/2021) 30 tablet 0   diclofenac Sodium (VOLTAREN) 1 % GEL Apply 2 g topically 4 (four) times daily. (Patient not taking: Reported on 08/03/2021) 150 g 1   oxybutynin (DITROPAN XL) 10 MG 24 hr tablet Take 1 tablet (10 mg total) by mouth daily. (Patient not taking: Reported on 08/31/2021) 90 tablet 1   rosuvastatin (CRESTOR) 10 MG tablet Take 1 tablet (10 mg total) by mouth daily. (Patient not taking: Reported on 08/31/2021) 90 tablet 3   tamsulosin (FLOMAX) 0.4 MG CAPS capsule Take 1 capsule (0.4 mg total) by mouth daily after supper. (Patient not taking:  Reported on 08/31/2021) 30 capsule 1   No current facility-administered medications for this visit.    Allergies:   Levaquin [levofloxacin in d5w] and Penicillins    ROS:  Please see the history of present illness.   Otherwise, review of systems are positive for none.   All other systems are reviewed and negative.    PHYSICAL EXAM: VS:  BP (!) 142/62   Pulse 67   Ht '5\' 4"'$  (1.626 m)   Wt 160 lb 6.4 oz (72.8 kg)   LMP 01/22/1981 (LMP Unknown)   SpO2 98%   BMI 27.53 kg/m  , BMI Body mass index is 27.53 kg/m. GENERAL:  Well appearing NECK:  No jugular venous distention, waveform within normal limits, carotid upstroke brisk and symmetric, no bruits, no thyromegaly LUNGS:  Clear to auscultation bilaterally CHEST:  Unremarkable HEART:  PMI not displaced or sustained,S1 and S2 within normal limits, no S3, no S4,  no clicks, no rubs, 3 out of 6 apical systolic murmur early peaking and radiating slightly at the aortic outflow tract, no diastolic murmurs ABD:  Flat, positive bowel sounds normal in frequency in pitch, no bruits, no rebound, no guarding, no midline pulsatile mass, no hepatomegaly, no splenomegaly EXT:  2 plus pulses throughout, no edema, no cyanosis no clubbing   EKG:  EKG is not ordered today. 08/11/2021 sinus rhythm, rate 48, left axis deviation, left anterior fascicular block, anterolateral T wave inversions unchanged from previous.   Recent Labs: 05/05/2021: ALT 12; Hemoglobin 14.5; Platelets 268; TSH 1.280 08/08/2021: BUN 33; Creatinine, Ser 0.90; Potassium 3.7; Sodium 143    Lipid Panel    Component Value Date/Time   CHOL 208 (H) 05/05/2021 1526   TRIG 240 (H) 05/05/2021 1526   HDL 40 05/05/2021 1526   CHOLHDL 5.2 (H) 05/05/2021 1526   CHOLHDL 4.6 12/17/2013 0353   VLDL 69 (H) 12/17/2013 0353   LDLCALC 125 (H) 05/05/2021 1526      Wt Readings from Last 3 Encounters:  08/31/21 160 lb 6.4 oz (72.8 kg)  08/11/21 151 lb (68.5 kg)  08/08/21 151 lb (68.5 kg)       Other studies Reviewed: Additional studies/ records that were reviewed today include: Labs Review of the above records demonstrates:  Please see elsewhere in the note.     ASSESSMENT AND PLAN:  Hypertension:    Her blood pressure is well controlled.  No change in therapy.  She says it is lower than it is today at home.  Nonischemic cardiomyopathy:   EF improved from 30 to 65%.  No change in therapy.  Murmur: This is unchanged.  There is no dynamic component.  Preop: The patient has high functional level.  She is not going for high risk procedure.  She has no unstable signs or symptoms.  No further cardiovascular testing is suggested.   Current medicines are reviewed at length with the patient today.  The patient does not have concerns regarding medicines.  The following changes have been made:  none  Labs/ tests ordered today include: none No orders of the defined types were placed in this encounter.    Disposition:   FU with me in 12 months.     Signed, Minus Breeding, MD  08/31/2021 3:22 PM    [facility]

## 2021-08-30 NOTE — Chronic Care Management (AMB) (Signed)
  Care Coordination Note  08/30/2021 Name: [name] MRN: [id number] DOB: Jul 29, 1945  [name] is a 76 y.o. year old female who is a primary care patient of Sharion Balloon, FNP and is actively engaged with the care management team. I reached out to Christella Noa by phone today to assist with re-scheduling an initial visit with the Pharmacist  Follow up plan: Unsuccessful telephone outreach attempt made. A HIPAA compliant phone message was left for the patient providing contact information and requesting a return call.  The care management team will reach out to the patient again over the next 7 days.  If patient returns call to provider office, please advise to call [facility]  at [place], [place], [place], [place] [address] Direct Dial: [phone] [email or web address]: [email or web address]

## 2021-08-31 ENCOUNTER — Ambulatory Visit: Payer: Medicare HMO | Admitting: Cardiology

## 2021-08-31 ENCOUNTER — Encounter: Payer: Self-pay | Admitting: Cardiology

## 2021-08-31 VITALS — BP 142/62 | HR 67 | Ht 64.0 in | Wt 160.4 lb

## 2021-08-31 DIAGNOSIS — I428 Other cardiomyopathies: Secondary | ICD-10-CM | POA: Diagnosis not present

## 2021-08-31 DIAGNOSIS — I10 Essential (primary) hypertension: Secondary | ICD-10-CM | POA: Diagnosis not present

## 2021-08-31 LAB — URINE CULTURE

## 2021-08-31 NOTE — Patient Instructions (Signed)

## 2021-09-06 ENCOUNTER — Encounter (HOSPITAL_COMMUNITY)
Admission: RE | Admit: 2021-09-06 | Discharge: 2021-09-06 | Disposition: A | Discharge status: Home / Self Care | Payer: Medicare HMO | Source: Ambulatory Visit | Attending: Urology | Admitting: Urology

## 2021-09-06 NOTE — Patient Instructions (Signed)
Your procedure is scheduled on: 09/08/2021  Report to [facility] at 8:15    AM.  Call this number if you have problems the morning of surgery: [phone]   Remember:   Do not Eat or Drink after midnight         No Smoking the morning of surgery  :  Take these medicines the morning of surgery with A SIP OF WATER: Amlodopine   Do not wear jewelry, make-up or nail polish.  Do not wear lotions, powders, or perfumes. You may wear deodorant.  Do not shave 48 hours prior to surgery. Men may shave face and neck.  Do not bring valuables to the hospital.  Contacts, dentures or bridgework may not be worn into surgery.  Leave suitcase in the car. After surgery it may be brought to your room.  For patients admitted to the hospital, checkout time is 11:00 AM the day of discharge.   Patients discharged the day of surgery will not be allowed to drive home.    Special Instructions: Shower using CHG night before surgery and shower the day of surgery use CHG.  Use special wash - you have one bottle of CHG for all showers.  You should use approximately 1/2 of the bottle for each shower.  Cystoscopy Cystoscopy is a procedure that is used to help diagnose and sometimes treat conditions that affect the lower urinary tract. The lower urinary tract includes the bladder and the urethra. The urethra is the tube that drains urine from the bladder. Cystoscopy is done using a thin, tube-shaped instrument with a light and camera at the end (cystoscope). The cystoscope may be hard or flexible, depending on the goal of the procedure. The cystoscope is inserted through the urethra, into the bladder. Cystoscopy may be recommended if you have: Urinary tract infections that keep coming back. Blood in the urine (hematuria). An inability to control when you urinate (urinary incontinence) or an overactive bladder. Unusual cells found in a urine sample. A blockage in the urethra, such as a urinary  stone. Painful urination. An abnormality in the bladder found during an intravenous pyelogram (IVP) or CT scan. Cystoscopy may also be done to remove a sample of tissue to be examined under a microscope (biopsy). Tell a health care provider about: Any allergies you have. All medicines you are taking, including vitamins, herbs, eye drops, creams, and over-the-counter medicines. Any problems you or family members have had with anesthetic medicines. Any blood disorders you have. Any surgeries you have had. Any medical conditions you have. Whether you are pregnant or may be pregnant. What are the risks? Generally, this is a safe procedure. However, problems may occur, including: Infection. Bleeding. Allergic reactions to medicines. Damage to other structures or organs. What happens before the procedure? Medicines Ask your health care provider about: Changing or stopping your regular medicines. This is especially important if you are taking diabetes medicines or blood thinners. Taking medicines such as aspirin and ibuprofen. These medicines can thin your blood. Do not take these medicines unless your health care provider tells you to take them. Taking over-the-counter medicines, vitamins, herbs, and supplements. Tests You may have an exam or testing, such as: X-rays of the bladder, urethra, or kidneys. CT scan of the abdomen or pelvis. Urine tests to check for signs of infection. General instructions Follow instructions from your health care provider about eating or drinking restrictions. Ask your health care provider what steps will be taken to help prevent  infection. These steps may include: Washing skin with a germ-killing soap. Taking antibiotic medicine. Plan to have a responsible adult take you home from the hospital or clinic. What happens during the procedure?  You will be given one or more of the following: A medicine to help you relax (sedative). A medicine to numb the  area (local anesthetic). The area around the opening of your urethra will be cleaned. The cystoscope will be passed through your urethra into your bladder. Germ-free (sterile) fluid will flow through the cystoscope to fill your bladder. The fluid will stretch your bladder so that your health care provider can clearly examine your bladder walls. Your doctor will look at the urethra and bladder. Your doctor may take a biopsy or remove stones. The cystoscope will be removed, and your bladder will be emptied. The procedure may vary among health care providers and hospitals. What can I expect after the procedure? After the procedure, it is common to have: Some soreness or pain in your abdomen and urethra. Urinary symptoms. These include: Mild pain or burning when you urinate. Pain should stop within a few minutes after you urinate. This may last for up to 1 week. A small amount of blood in your urine for several days. Feeling like you need to urinate but producing only a small amount of urine. Follow these instructions at home: Medicines Take over-the-counter and prescription medicines only as told by your health care provider. If you were prescribed an antibiotic medicine, take it as told by your health care provider. Do not stop taking the antibiotic even if you start to feel better. General instructions Return to your normal activities as told by your health care provider. Ask your health care provider what activities are safe for you. If you were given a sedative during the procedure, it can affect you for several hours. Do not drive or operate machinery until your health care provider says that it is safe. Watch for any blood in your urine. If the amount of blood in your urine increases, call your health care provider. Follow instructions from your health care provider about eating or drinking restrictions. If a tissue sample was removed for testing (biopsy) during your procedure, it is up to  you to get your test results. Ask your health care provider, or the department that is doing the test, when your results will be ready. Drink enough fluid to keep your urine pale yellow. Keep all follow-up visits. This is important. Contact a health care provider if: You have pain that gets worse or does not get better with medicine, especially pain when you urinate. You have trouble urinating. You have more blood in your urine. Get help right away if: You have blood clots in your urine. You have abdominal pain. You have a fever or chills. You are unable to urinate. Summary Cystoscopy is a procedure that is used to help diagnose and sometimes treat conditions that affect the lower urinary tract. Cystoscopy is done using a thin, tube-shaped instrument with a light and camera at the end. After the procedure, it is common to have some soreness or pain in your abdomen and urethra. Watch for any blood in your urine. If the amount of blood in your urine increases, call your health care provider. If you were prescribed an antibiotic medicine, take it as told by your health care provider. Do not stop taking the antibiotic even if you start to feel better. This information is not intended to replace advice given  to you by your health care provider. Make sure you discuss any questions you have with your health care provider. Document Revised: 12/08/2020 Document Reviewed: 11/07/2019 Elsevier Patient Education  [place] Anesthesia, Adult, Care After This sheet gives you information about how to care for yourself after your procedure. Your health care provider may also give you more specific instructions. If you have problems or questions, contact your health care provider. What can I expect after the procedure? After the procedure, the following side effects are common: Pain or discomfort at the IV site. Nausea. Vomiting. Sore throat. Trouble concentrating. Feeling cold or  chills. Feeling weak or tired. Sleepiness and fatigue. Soreness and body aches. These side effects can affect parts of the body that were not involved in surgery. Follow these instructions at home: For the time period you were told by your health care provider:  Rest. Do not participate in activities where you could fall or become injured. Do not drive or use machinery. Do not drink alcohol. Do not take sleeping pills or medicines that cause drowsiness. Do not make important decisions or sign legal documents. Do not take care of children on your own. Eating and drinking Follow any instructions from your health care provider about eating or drinking restrictions. When you feel hungry, start by eating small amounts of foods that are soft and easy to digest (bland), such as toast. Gradually return to your regular diet. Drink enough fluid to keep your urine pale yellow. If you vomit, rehydrate by drinking water, juice, or clear broth. General instructions If you have sleep apnea, surgery and certain medicines can increase your risk for breathing problems. Follow instructions from your health care provider about wearing your sleep device: Anytime you are sleeping, including during daytime naps. While taking prescription pain medicines, sleeping medicines, or medicines that make you drowsy. Have a responsible adult stay with you for the time you are told. It is important to have someone help care for you until you are awake and alert. Return to your normal activities as told by your health care provider. Ask your health care provider what activities are safe for you. Take over-the-counter and prescription medicines only as told by your health care provider. If you smoke, do not smoke without supervision. Keep all follow-up visits as told by your health care provider. This is important. Contact a health care provider if: You have nausea or vomiting that does not get better with medicine. You  cannot eat or drink without vomiting. You have pain that does not get better with medicine. You are unable to pass urine. You develop a skin rash. You have a fever. You have redness around your IV site that gets worse. Get help right away if: You have difficulty breathing. You have chest pain. You have blood in your urine or stool, or you vomit blood. Summary After the procedure, it is common to have a sore throat or nausea. It is also common to feel tired. Have a responsible adult stay with you for the time you are told. It is important to have someone help care for you until you are awake and alert. When you feel hungry, start by eating small amounts of foods that are soft and easy to digest (bland), such as toast. Gradually return to your regular diet. Drink enough fluid to keep your urine pale yellow. Return to your normal activities as told by your health care provider. Ask your health care provider what activities are safe for  you. This information is not intended to replace advice given to you by your health care provider. Make sure you discuss any questions you have with your health care provider. Document Revised: 12/11/2019 Document Reviewed: 07/10/2019 Elsevier Patient Education  [place].

## 2021-09-07 ENCOUNTER — Encounter: Payer: Self-pay | Admitting: Urology

## 2021-09-07 NOTE — Progress Notes (Signed)
08/29/2021 9:16 AM   Christella Noa Oct 19, 1945 [id number]  Referring provider: Sharion Balloon, [address] [place],  [place] [address]  dysuria   HPI: Ms Jurek is a 76yo here with new onset dysuria. She was doing well after her ureteroscopy until yesterday when she developed worsening urinary urgency, frequency, and dysuria. She had a temp of 99 at home. She denies nay chills. UA is concerning for infection. No other complaints today   PMH: Past Medical History:  Diagnosis Date   Asthma    Chronic systolic heart failure ([place])    Echo (08/26/13):  Mild LVH. EF 25% to 30%. Diffuse HK. Aortic valve: There was trivial regurgitation. Mitral valve: There was mild regurgitation. Left atrium: The atrium was mildly dilated.   GERD (gastroesophageal reflux disease)    Headache(784.0)    History of nephrolithiasis    Hypertension    NICM (nonischemic cardiomyopathy) ([place])    a. LHC (08/2013):  no CAD   Sleep apnea    does not use machine   Stroke ([facility])    Type II or unspecified type diabetes mellitus without mention of complication, uncontrolled    borderline    Surgical History: Past Surgical History:  Procedure Laterality Date   ABDOMINAL HYSTERECTOMY     COLONOSCOPY WITH PROPOFOL N/A 07/01/2019   Procedure: COLONOSCOPY WITH PROPOFOL;  Surgeon: Danie Binder, MD;  [facility]: [facility];  Service: Endoscopy;  Laterality: N/A;  2:15pm - pt will not move up   [place], URETEROSCOPY AND STENT PLACEMENT Left 08/11/2021   Procedure: [place], URETEROSCOPY AND STENT PLACEMENT;  Surgeon: Cleon Gustin, MD;  Location: [facility];  Service: Urology;  Laterality: Left;   EXTRACORPOREAL SHOCK WAVE LITHOTRIPSY Left 05/31/2021   Procedure: EXTRACORPOREAL SHOCK WAVE LITHOTRIPSY (ESWL);  Surgeon: Cleon Gustin, MD;  Location: [facility];  Service: Urology;  Laterality: Left;   EXTRACORPOREAL SHOCK WAVE LITHOTRIPSY Left  06/28/2021   Procedure: EXTRACORPOREAL SHOCK WAVE LITHOTRIPSY (ESWL);  Surgeon: Cleon Gustin, MD;  Location: [facility];  Service: Urology;  Laterality: Left;   HOLMIUM LASER APPLICATION Left 12/13/7094   Procedure: HOLMIUM LASER APPLICATION;  Surgeon: Cleon Gustin, MD;  Location: [facility];  Service: Urology;  Laterality: Left;   LEFT AND RIGHT HEART CATHETERIZATION WITH CORONARY ANGIOGRAM N/A 09/03/2013   Procedure: LEFT AND RIGHT HEART CATHETERIZATION WITH CORONARY ANGIOGRAM;  Surgeon: [name], MD;  Location: [facility] CATH LAB;  Service: Cardiovascular;  Laterality: N/A;   OOPHORECTOMY     removed 1 ovary and 1 ovary remains.   POLYPECTOMY  07/01/2019   Procedure: POLYPECTOMY;  Surgeon: Danie Binder, MD;  [facility];  Service: Endoscopy;;   ureteral extraction of kidney stone      Home Medications:  Allergies as of 08/29/2021       Reactions   Levaquin [levofloxacin In D5w] Diarrhea   Penicillins Rash        Medication List        Accurate as of Aug 29, 2021 11:59 PM. If you have any questions, ask your nurse or doctor.          acetaminophen 500 MG tablet Commonly known as: TYLENOL Take 1,000 mg by mouth every 6 (six) hours as needed for moderate pain or headache.   albuterol 108 (90 Base) MCG/ACT inhaler Commonly known as: VENTOLIN HFA Inhale 2 puffs into the lungs every 6 (six) hours as needed for wheezing  or shortness of breath.   alendronate 70 MG tablet Commonly known as: Fosamax Take 1 tablet (70 mg total) by mouth every 7 (seven) days. Take with a full glass of water on an empty stomach.   amLODipine 10 MG tablet Commonly known as: NORVASC Take 1 tablet (10 mg total) by mouth daily.   bisoprolol 10 MG tablet Commonly known as: ZEBETA Take 1 tablet (10 mg total) by mouth daily.   Blood Pressure Kit Devi 1 Device by Does not apply route 2 (two) times daily.   diclofenac 75 MG EC tablet Commonly known as: VOLTAREN Take 1  tablet (75 mg total) by mouth 2 (two) times daily.   diclofenac Sodium 1 % Gel Commonly known as: Voltaren Apply 2 g topically 4 (four) times daily.   doxycycline 100 MG capsule Commonly known as: VIBRAMYCIN Take 1 capsule (100 mg total) by mouth every 12 (twelve) hours.   Entresto 97-103 MG Generic drug: sacubitril-valsartan Take 1 tablet by mouth twice daily   glucose blood test strip Commonly known as: OneTouch Verio Use as instructed   HYDROcodone-acetaminophen 5-325 MG tablet Commonly known as: Norco Take 1 tablet by mouth every 6 (six) hours as needed for moderate pain.   OneTouch Delica Lancets Fine Misc Use to check BG once daily   oxybutynin 10 MG 24 hr tablet Commonly known as: Ditropan XL Take 1 tablet (10 mg total) by mouth daily.   rosuvastatin 10 MG tablet Commonly known as: Crestor Take 1 tablet (10 mg total) by mouth daily.   sulfamethoxazole-trimethoprim 800-160 MG tablet Commonly known as: BACTRIM DS Take 1 tablet by mouth 2 (two) times daily.   tamsulosin 0.4 MG Caps capsule Commonly known as: Flomax Take 1 capsule (0.4 mg total) by mouth daily after supper.   Vitamin D (Ergocalciferol) 1.25 MG (50000 UNIT) Caps capsule Commonly known as: DRISDOL Take 1 capsule (50,000 Units total) by mouth every 7 (seven) days.        Allergies:  Allergies  Allergen Reactions   Levaquin [Levofloxacin In D5w] Diarrhea   Penicillins Rash    Family History: Family History  Problem Relation Age of Onset   Hypertension Mother    Arthritis Mother    CAD Mother 70   Hip fracture Mother 76   COPD Father    Crohn's disease Sister    Cancer Sister        lung   Early death Brother 28       house fire   Diabetes Maternal Grandmother    Hypertension Sister    Hypertension Brother    Colon cancer Neg Hx     Social History:  reports that she has never smoked. She has never used smokeless tobacco. She reports current alcohol use. She reports that she  does not use drugs.  ROS: All other review of systems were reviewed and are negative except what is noted above in HPI  Physical Exam: BP (!) 158/64   Pulse 69   LMP 01/22/1981 (LMP Unknown)   Constitutional:  Alert and oriented, No acute distress. HEENT: [place] AT, moist mucus membranes.  Trachea midline, no masses. Cardiovascular: No clubbing, cyanosis, or edema. Respiratory: Normal respiratory effort, no increased work of breathing. GI: Abdomen is soft, nontender, nondistended, no abdominal masses GU: No CVA tenderness.  Lymph: No cervical or inguinal lymphadenopathy. Skin: No rashes, bruises or suspicious lesions. Neurologic: Grossly intact, no focal deficits, moving all 4 extremities. Psychiatric: Normal mood and affect.  Laboratory Data: Lab   Results  Component Value Date   WBC 8.2 05/05/2021   HGB 14.5 05/05/2021   HCT 43.6 05/05/2021   MCV 87 05/05/2021   PLT 268 05/05/2021    Lab Results  Component Value Date   CREATININE 0.90 08/08/2021    No results found for: PSA  No results found for: TESTOSTERONE  Lab Results  Component Value Date   HGBA1C 6.9 (H) 08/08/2021    Urinalysis    Component Value Date/Time   COLORURINE YELLOW 12/16/2013 1120   APPEARANCEUR Hazy (A) 08/29/2021 1547   LABSPEC 1.013 12/16/2013 1120   PHURINE 6.5 12/16/2013 1120   GLUCOSEU Negative 08/29/2021 1547   HGBUR MODERATE (A) 12/16/2013 1120   BILIRUBINUR Negative 08/29/2021 1547   KETONESUR NEGATIVE 12/16/2013 1120   PROTEINUR 3+ (A) 08/29/2021 1547   PROTEINUR 30 (A) 12/16/2013 1120   UROBILINOGEN negative 07/24/2014 1519   UROBILINOGEN 0.2 12/16/2013 1120   NITRITE Negative 08/29/2021 1547   NITRITE NEGATIVE 12/16/2013 1120   LEUKOCYTESUR 3+ (A) 08/29/2021 1547    Lab Results  Component Value Date   LABMICR See below: 08/29/2021   WBCUA 11-30 (A) 08/29/2021   LABEPIT 0-10 08/29/2021   MUCUS Present 08/29/2021   BACTERIA Moderate (A) 08/29/2021    Pertinent  Imaging:  Results for orders placed during the hospital encounter of 07/13/21  DG Abd 1 View  Narrative CLINICAL DATA:  Post lithotripsy with continued left flank pain.  EXAM: ABDOMEN - 1 VIEW  COMPARISON:  None.  FINDINGS: The bowel gas pattern is normal.  Right renal calculi are unchanged largest measuring 2.4 cm in transverse dimension.  Numerous left renal calculi are again seen measuring up to 1 cm. The majority are unchanged and measuring up to 1 cm. Overall, slightly decreased in size and number.  Osseous structures are stable.  IMPRESSION: 1. Left renal calculi have mildly decreased in size and number. 2. Stable right renal calculi.   Electronically Signed By: Ronney Asters M.D. On: 07/14/2021 17:12  No results found for this or any previous visit.  No results found for this or any previous visit.  No results found for this or any previous visit.  No results found for this or any previous visit.  No results found for this or any previous visit.  Results for orders placed in visit on 05/18/21  CT HEMATURIA WORKUP  Narrative CLINICAL DATA:  Hematuria over the last 3 weeks. History of diabetes and hypertension. Cystitis. Urinary incontinence.  EXAM: CT ABDOMEN AND PELVIS WITHOUT AND WITH CONTRAST  TECHNIQUE: Multidetector CT imaging of the abdomen and pelvis was performed following the standard protocol before and following the bolus administration of intravenous contrast.  RADIATION DOSE REDUCTION: This exam was performed according to the departmental dose-optimization program which includes automated exposure control, adjustment of the mA and/or kV according to patient size and/or use of iterative reconstruction technique.  CONTRAST:  142m OMNIPAQUE IOHEXOL 300 MG/ML  SOLN  COMPARISON:  CT abdomen 10/04/2001  FINDINGS: Lower chest: Mild scarring in the right middle lobe, right lower lobe, and lingula. 4 by 3 mm pleural-based nodule along  the left lower lobe on image 61 series 15, not changed from 05/11/2020. Small to moderate-sized hiatal hernia. Mild cardiomegaly. Descending thoracic aortic atherosclerosis.  Hepatobiliary: Contracted gallbladder.  Otherwise unremarkable.  Pancreas: Unremarkable  Spleen: Unremarkable  Adrenals/Urinary Tract: Both adrenal glands appear normal.  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 A branching 2.2 by 1.7 cm collecting system staghorn calculus in the  right renal pelvis is associated with lower pole and mid kidney caliectasis  2.5 by 1.8 cm filling defect in the right mid to lower kidney infundibulum and calyx proximal to the stone on image 56 series 16 does not appear to enhance and accordingly favors plan thrombus over tumor. Similar filling defect is observed in much of the anterior kidney collecting system as shown for example on image 37 series 13. Within the blunted calices the presumed clot as outlined by excreted contrast.  There calcifications near the distal ureters but not within the distal ureters them cells. A separate right renal calculus is nonobstructive and measures 3 mm in diameter on image 34 series 2.  Which like the contralateral side, there is a staghorn calculus in the left mid kidney collecting system abutting the UPJ, measuring 1.7 cm in long axis. Release 5 additional nonobstructive renal calculi are present in the left kidney. There is no overt left hydronephrosis nor do we demonstrate additional casting filling defects in the left kidney. There is some mild left caliectasis particularly in upper pole example on image 32 series 13.  There is abnormal wall thickening in the right proximal ureter extending along 4.3 cm from the stone, possibly from inflammatory wall thickening, less likely to be from thrombus or tumor. Similar appearance on the left extending along 3.4 cm distal to the collecting system calculus in the left proximal  ureter with only a thin channel of contrast in this vicinity.  Urinary bladder unremarkable.  Stomach/Bowel: Hiatal hernia.  Sigmoid colon diverticulosis.  Vascular/Lymphatic: Atherosclerosis is present, including aortoiliac atherosclerotic disease and atherosclerotic calcifications in branches of the abdominal aorta.  Reproductive: Uterus absent.  Adnexa unremarkable.  Other: No supplemental non-categorized findings.  Musculoskeletal: Thoracic and lumbar spondylosis and degenerative disc disease.  IMPRESSION: 1. Bilateral staghorn calculi in the collecting systems extending to the UPJ on both sides. These are associated with abnormal thickening of the proximal ureters bilaterally, as well as mild bilateral hydronephrosis. 2. In the right renal collecting system, the large staghorn calculus is associated with abnormal noncalcified filling defect in anterior infundibula and calices probably from blood clot given the fact that this abnormal density filling the infundibulum and calices is not appreciably enhancing. Difficult to exclude small amounts of tumor in this region given the substantially abnormal appearance. See for example image 37 series 13. 3. Renal atrophy and scarring with renal sinus lipomatosis. 4. Additional nonobstructive renal calculi are present bilaterally. 5. Stable 4 by 3 mm pleural-based nodule in the left lower lobe, not changed over the last year, thought to likely be benign. 6. Mild bibasilar scarring. 7. Other imaging findings of potential clinical significance: Small to moderate-sized hiatal hernia. Mild cardiomegaly. Sigmoid colon diverticulosis. Aortic Atherosclerosis (ICD10-I70.0). Systemic atherosclerosis. Thoracolumbar spondylosis and degenerative disc disease.   Electronically Signed By: Van Clines M.D. On: 05/23/2021 14:17  No results found for this or any previous visit.   Assessment & Plan:    1. Acute cystitis without  hematuria -doxycycline 159m BID for 7 days - Urine Culture - Urinalysis, Routine w reflex microscopic   No follow-ups on file.  PNicolette Bang MD  [facility] [place]

## 2021-09-07 NOTE — Patient Instructions (Signed)
Urinary Tract Infection, Adult A urinary tract infection (UTI) is an infection of any part of the urinary tract. The urinary tract includes: The kidneys. The ureters. The bladder. The urethra. These organs make, store, and get rid of pee (urine) in the body. What are the causes? This infection is caused by germs (bacteria) in your genital area. These germs grow and cause swelling (inflammation) of your urinary tract. What increases the risk? The following factors may make you more likely to develop this condition: Using a small, thin tube (catheter) to drain pee. Not being able to control when you pee or poop (incontinence). Being female. If you are female, these things can increase the risk: Using these methods to prevent pregnancy: A medicine that kills sperm (spermicide). A device that blocks sperm (diaphragm). Having low levels of a female hormone (estrogen). Being pregnant. You are more likely to develop this condition if: You have genes that add to your risk. You are sexually active. You take antibiotic medicines. You have trouble peeing because of: A prostate that is bigger than normal, if you are female. A blockage in the part of your body that drains pee from the bladder. A kidney stone. A nerve condition that affects your bladder. Not getting enough to drink. Not peeing often enough. You have other conditions, such as: Diabetes. A weak disease-fighting system (immune system). Sickle cell disease. Gout. Injury of the spine. What are the signs or symptoms? Symptoms of this condition include: Needing to pee right away. Peeing small amounts often. Pain or burning when peeing. Blood in the pee. Pee that smells bad or not like normal. Trouble peeing. Pee that is cloudy. Fluid coming from the vagina, if you are female. Pain in the belly or lower back. Other symptoms include: Vomiting. Not feeling hungry. Feeling mixed up (confused). This may be the first symptom in  older adults. Being tired and grouchy (irritable). A fever. Watery poop (diarrhea). How is this treated? Taking antibiotic medicine. Taking other medicines. Drinking enough water. In some cases, you may need to see a specialist. Follow these instructions at home:  Medicines Take over-the-counter and prescription medicines only as told by your doctor. If you were prescribed an antibiotic medicine, take it as told by your doctor. Do not stop taking it even if you start to feel better. General instructions Make sure you: Pee until your bladder is empty. Do not hold pee for a long time. Empty your bladder after sex. Wipe from front to back after peeing or pooping if you are a female. Use each tissue one time when you wipe. Drink enough fluid to keep your pee pale yellow. Keep all follow-up visits. Contact a doctor if: You do not get better after 1-2 days. Your symptoms go away and then come back. Get help right away if: You have very bad back pain. You have very bad pain in your lower belly. You have a fever. You have chills. You feeling like you will vomit or you vomit. Summary A urinary tract infection (UTI) is an infection of any part of the urinary tract. This condition is caused by germs in your genital area. There are many risk factors for a UTI. Treatment includes antibiotic medicines. Drink enough fluid to keep your pee pale yellow. This information is not intended to replace advice given to you by your health care provider. Make sure you discuss any questions you have with your health care provider. Document Revised: 11/07/2019 Document Reviewed: 11/07/2019 Elsevier Patient Education    2023 [employer]  

## 2021-09-07 NOTE — H&P (View-Only) (Signed)
08/29/2021 9:16 AM   [name] Oct 19, 1945 [id number]  Referring provider: Sharion Balloon, [address] [place],  [place] [address]  dysuria   HPI: Ms Jurek is a 76yo here with new onset dysuria. She was doing well after her ureteroscopy until yesterday when she developed worsening urinary urgency, frequency, and dysuria. She had a temp of 99 at home. She denies nay chills. UA is concerning for infection. No other complaints today   PMH: Past Medical History:  Diagnosis Date   Asthma    Chronic systolic heart failure ([place])    Echo (08/26/13):  Mild LVH. EF 25% to 30%. Diffuse HK. Aortic valve: There was trivial regurgitation. Mitral valve: There was mild regurgitation. Left atrium: The atrium was mildly dilated.   GERD (gastroesophageal reflux disease)    Headache(784.0)    History of nephrolithiasis    Hypertension    NICM (nonischemic cardiomyopathy) ([place])    a. LHC (08/2013):  no CAD   Sleep apnea    does not use machine   Stroke ([facility])    Type II or unspecified type diabetes mellitus without mention of complication, uncontrolled    borderline    Surgical History: Past Surgical History:  Procedure Laterality Date   ABDOMINAL HYSTERECTOMY     COLONOSCOPY WITH PROPOFOL N/A 07/01/2019   Procedure: COLONOSCOPY WITH PROPOFOL;  Surgeon: Danie Binder, MD;  [facility]: [facility];  Service: Endoscopy;  Laterality: N/A;  2:15pm - pt will not move up   [place], URETEROSCOPY AND STENT PLACEMENT Left 08/11/2021   Procedure: [place], URETEROSCOPY AND STENT PLACEMENT;  Surgeon: Cleon Gustin, MD;  Location: [facility];  Service: Urology;  Laterality: Left;   EXTRACORPOREAL SHOCK WAVE LITHOTRIPSY Left 05/31/2021   Procedure: EXTRACORPOREAL SHOCK WAVE LITHOTRIPSY (ESWL);  Surgeon: Cleon Gustin, MD;  Location: [facility];  Service: Urology;  Laterality: Left;   EXTRACORPOREAL SHOCK WAVE LITHOTRIPSY Left  06/28/2021   Procedure: EXTRACORPOREAL SHOCK WAVE LITHOTRIPSY (ESWL);  Surgeon: Cleon Gustin, MD;  Location: [facility];  Service: Urology;  Laterality: Left;   HOLMIUM LASER APPLICATION Left 12/13/7094   Procedure: HOLMIUM LASER APPLICATION;  Surgeon: Cleon Gustin, MD;  Location: [facility];  Service: Urology;  Laterality: Left;   LEFT AND RIGHT HEART CATHETERIZATION WITH CORONARY ANGIOGRAM N/A 09/03/2013   Procedure: LEFT AND RIGHT HEART CATHETERIZATION WITH CORONARY ANGIOGRAM;  Surgeon: [name], MD;  Location: [facility] CATH LAB;  Service: Cardiovascular;  Laterality: N/A;   OOPHORECTOMY     removed 1 ovary and 1 ovary remains.   POLYPECTOMY  07/01/2019   Procedure: POLYPECTOMY;  Surgeon: Danie Binder, MD;  [facility]: [facility];  Service: Endoscopy;;   ureteral extraction of kidney stone      Home Medications:  Allergies as of 08/29/2021       Reactions   Levaquin [levofloxacin In D5w] Diarrhea   Penicillins Rash        Medication List        Accurate as of Aug 29, 2021 11:59 PM. If you have any questions, ask your nurse or doctor.          acetaminophen 500 MG tablet Commonly known as: TYLENOL Take 1,000 mg by mouth every 6 (six) hours as needed for moderate pain or headache.   albuterol 108 (90 Base) MCG/ACT inhaler Commonly known as: VENTOLIN HFA Inhale 2 puffs into the lungs every 6 (six) hours as needed for wheezing  or shortness of breath.   alendronate 70 MG tablet Commonly known as: Fosamax Take 1 tablet (70 mg total) by mouth every 7 (seven) days. Take with a full glass of water on an empty stomach.   amLODipine 10 MG tablet Commonly known as: NORVASC Take 1 tablet (10 mg total) by mouth daily.   bisoprolol 10 MG tablet Commonly known as: ZEBETA Take 1 tablet (10 mg total) by mouth daily.   Blood Pressure Kit Devi 1 Device by Does not apply route 2 (two) times daily.   diclofenac 75 MG EC tablet Commonly known as: VOLTAREN Take 1  tablet (75 mg total) by mouth 2 (two) times daily.   diclofenac Sodium 1 % Gel Commonly known as: Voltaren Apply 2 g topically 4 (four) times daily.   doxycycline 100 MG capsule Commonly known as: VIBRAMYCIN Take 1 capsule (100 mg total) by mouth every 12 (twelve) hours.   Entresto 97-103 MG Generic drug: sacubitril-valsartan Take 1 tablet by mouth twice daily   glucose blood test strip Commonly known as: OneTouch Verio Use as instructed   HYDROcodone-acetaminophen 5-325 MG tablet Commonly known as: Norco Take 1 tablet by mouth every 6 (six) hours as needed for moderate pain.   OneTouch Delica Lancets Fine Misc Use to check BG once daily   oxybutynin 10 MG 24 hr tablet Commonly known as: Ditropan XL Take 1 tablet (10 mg total) by mouth daily.   rosuvastatin 10 MG tablet Commonly known as: Crestor Take 1 tablet (10 mg total) by mouth daily.   sulfamethoxazole-trimethoprim 800-160 MG tablet Commonly known as: BACTRIM DS Take 1 tablet by mouth 2 (two) times daily.   tamsulosin 0.4 MG Caps capsule Commonly known as: Flomax Take 1 capsule (0.4 mg total) by mouth daily after supper.   Vitamin D (Ergocalciferol) 1.25 MG (50000 UNIT) Caps capsule Commonly known as: DRISDOL Take 1 capsule (50,000 Units total) by mouth every 7 (seven) days.        Allergies:  Allergies  Allergen Reactions   Levaquin [Levofloxacin In D5w] Diarrhea   Penicillins Rash    Family History: Family History  Problem Relation Age of Onset   Hypertension Mother    Arthritis Mother    CAD Mother 76   Hip fracture Mother 39   COPD Father    Crohn's disease Sister    Cancer Sister        lung   Early death Brother 15       house fire   Diabetes Maternal Grandmother    Hypertension Sister    Hypertension Brother    Colon cancer Neg Hx     Social History:  reports that she has never smoked. She has never used smokeless tobacco. She reports current alcohol use. She reports that she  does not use drugs.  ROS: All other review of systems were reviewed and are negative except what is noted above in HPI  Physical Exam: BP (!) 158/64   Pulse 69   LMP 01/22/1981 (LMP Unknown)   Constitutional:  Alert and oriented, No acute distress. HEENT: [place] AT, moist mucus membranes.  Trachea midline, no masses. Cardiovascular: No clubbing, cyanosis, or edema. Respiratory: Normal respiratory effort, no increased work of breathing. GI: Abdomen is soft, nontender, nondistended, no abdominal masses GU: No CVA tenderness.  Lymph: No cervical or inguinal lymphadenopathy. Skin: No rashes, bruises or suspicious lesions. Neurologic: Grossly intact, no focal deficits, moving all 4 extremities. Psychiatric: Normal mood and affect.  Laboratory Data: Lab  Results  Component Value Date   WBC 8.2 05/05/2021   HGB 14.5 05/05/2021   HCT 43.6 05/05/2021   MCV 87 05/05/2021   PLT 268 05/05/2021    Lab Results  Component Value Date   CREATININE 0.90 08/08/2021    No results found for: PSA  No results found for: TESTOSTERONE  Lab Results  Component Value Date   HGBA1C 6.9 (H) 08/08/2021    Urinalysis    Component Value Date/Time   COLORURINE YELLOW 12/16/2013 1120   APPEARANCEUR Hazy (A) 08/29/2021 1547   LABSPEC 1.013 12/16/2013 1120   PHURINE 6.5 12/16/2013 1120   GLUCOSEU Negative 08/29/2021 1547   HGBUR MODERATE (A) 12/16/2013 1120   BILIRUBINUR Negative 08/29/2021 1547   KETONESUR NEGATIVE 12/16/2013 1120   PROTEINUR 3+ (A) 08/29/2021 1547   PROTEINUR 30 (A) 12/16/2013 1120   UROBILINOGEN negative 07/24/2014 1519   UROBILINOGEN 0.2 12/16/2013 1120   NITRITE Negative 08/29/2021 1547   NITRITE NEGATIVE 12/16/2013 1120   LEUKOCYTESUR 3+ (A) 08/29/2021 1547    Lab Results  Component Value Date   LABMICR See below: 08/29/2021   WBCUA 11-30 (A) 08/29/2021   LABEPIT 0-10 08/29/2021   MUCUS Present 08/29/2021   BACTERIA Moderate (A) 08/29/2021    Pertinent  Imaging:  Results for orders placed during the hospital encounter of 07/13/21  DG Abd 1 View  Narrative CLINICAL DATA:  Post lithotripsy with continued left flank pain.  EXAM: ABDOMEN - 1 VIEW  COMPARISON:  None.  FINDINGS: The bowel gas pattern is normal.  Right renal calculi are unchanged largest measuring 2.4 cm in transverse dimension.  Numerous left renal calculi are again seen measuring up to 1 cm. The majority are unchanged and measuring up to 1 cm. Overall, slightly decreased in size and number.  Osseous structures are stable.  IMPRESSION: 1. Left renal calculi have mildly decreased in size and number. 2. Stable right renal calculi.   Electronically Signed By: Ronney Asters M.D. On: 07/14/2021 17:12  No results found for this or any previous visit.  No results found for this or any previous visit.  No results found for this or any previous visit.  No results found for this or any previous visit.  No results found for this or any previous visit.  Results for orders placed in visit on 05/18/21  CT HEMATURIA WORKUP  Narrative CLINICAL DATA:  Hematuria over the last 3 weeks. History of diabetes and hypertension. Cystitis. Urinary incontinence.  EXAM: CT ABDOMEN AND PELVIS WITHOUT AND WITH CONTRAST  TECHNIQUE: Multidetector CT imaging of the abdomen and pelvis was performed following the standard protocol before and following the bolus administration of intravenous contrast.  RADIATION DOSE REDUCTION: This exam was performed according to the departmental dose-optimization program which includes automated exposure control, adjustment of the mA and/or kV according to patient size and/or use of iterative reconstruction technique.  CONTRAST:  142m OMNIPAQUE IOHEXOL 300 MG/ML  SOLN  COMPARISON:  CT abdomen 10/04/2001  FINDINGS: Lower chest: Mild scarring in the right middle lobe, right lower lobe, and lingula. 4 by 3 mm pleural-based nodule along  the left lower lobe on image 61 series 15, not changed from 05/11/2020. Small to moderate-sized hiatal hernia. Mild cardiomegaly. Descending thoracic aortic atherosclerosis.  Hepatobiliary: Contracted gallbladder.  Otherwise unremarkable.  Pancreas: Unremarkable  Spleen: Unremarkable  Adrenals/Urinary Tract: Both adrenal glands appear normal.  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 A branching 2.2 by 1.7 cm collecting system staghorn calculus in the  right renal pelvis is associated with lower pole and mid kidney caliectasis  2.5 by 1.8 cm filling defect in the right mid to lower kidney infundibulum and calyx proximal to the stone on image 56 series 16 does not appear to enhance and accordingly favors plan thrombus over tumor. Similar filling defect is observed in much of the anterior kidney collecting system as shown for example on image 37 series 13. Within the blunted calices the presumed clot as outlined by excreted contrast.  There calcifications near the distal ureters but not within the distal ureters them cells. A separate right renal calculus is nonobstructive and measures 3 mm in diameter on image 34 series 2.  Which like the contralateral side, there is a staghorn calculus in the left mid kidney collecting system abutting the UPJ, measuring 1.7 cm in long axis. Release 5 additional nonobstructive renal calculi are present in the left kidney. There is no overt left hydronephrosis nor do we demonstrate additional casting filling defects in the left kidney. There is some mild left caliectasis particularly in upper pole example on image 32 series 13.  There is abnormal wall thickening in the right proximal ureter extending along 4.3 cm from the stone, possibly from inflammatory wall thickening, less likely to be from thrombus or tumor. Similar appearance on the left extending along 3.4 cm distal to the collecting system calculus in the left proximal  ureter with only a thin channel of contrast in this vicinity.  Urinary bladder unremarkable.  Stomach/Bowel: Hiatal hernia.  Sigmoid colon diverticulosis.  Vascular/Lymphatic: Atherosclerosis is present, including aortoiliac atherosclerotic disease and atherosclerotic calcifications in branches of the abdominal aorta.  Reproductive: Uterus absent.  Adnexa unremarkable.  Other: No supplemental non-categorized findings.  Musculoskeletal: Thoracic and lumbar spondylosis and degenerative disc disease.  IMPRESSION: 1. Bilateral staghorn calculi in the collecting systems extending to the UPJ on both sides. These are associated with abnormal thickening of the proximal ureters bilaterally, as well as mild bilateral hydronephrosis. 2. In the right renal collecting system, the large staghorn calculus is associated with abnormal noncalcified filling defect in anterior infundibula and calices probably from blood clot given the fact that this abnormal density filling the infundibulum and calices is not appreciably enhancing. Difficult to exclude small amounts of tumor in this region given the substantially abnormal appearance. See for example image 37 series 13. 3. Renal atrophy and scarring with renal sinus lipomatosis. 4. Additional nonobstructive renal calculi are present bilaterally. 5. Stable 4 by 3 mm pleural-based nodule in the left lower lobe, not changed over the last year, thought to likely be benign. 6. Mild bibasilar scarring. 7. Other imaging findings of potential clinical significance: Small to moderate-sized hiatal hernia. Mild cardiomegaly. Sigmoid colon diverticulosis. Aortic Atherosclerosis (ICD10-I70.0). Systemic atherosclerosis. Thoracolumbar spondylosis and degenerative disc disease.   Electronically Signed By: Van Clines M.D. On: 05/23/2021 14:17  No results found for this or any previous visit.   Assessment & Plan:    1. Acute cystitis without  hematuria -doxycycline 159m BID for 7 days - Urine Culture - Urinalysis, Routine w reflex microscopic   No follow-ups on file.  PNicolette Bang MD  [facility]

## 2021-09-08 ENCOUNTER — Ambulatory Visit (HOSPITAL_COMMUNITY): Payer: Medicare HMO

## 2021-09-08 ENCOUNTER — Encounter (HOSPITAL_COMMUNITY): Payer: Self-pay | Admitting: Urology

## 2021-09-08 ENCOUNTER — Ambulatory Visit (HOSPITAL_COMMUNITY)
Admission: RE | Admit: 2021-09-08 | Discharge: 2021-09-08 | Disposition: A | Discharge status: Home / Self Care | Payer: Medicare HMO | Attending: Urology | Admitting: Urology

## 2021-09-08 ENCOUNTER — Ambulatory Visit (HOSPITAL_COMMUNITY): Payer: Medicare HMO | Admitting: Registered Nurse

## 2021-09-08 ENCOUNTER — Encounter (HOSPITAL_COMMUNITY)
Admission: RE | Disposition: A | Discharge status: Home / Self Care | Payer: Self-pay | Source: Home / Self Care | Attending: Urology

## 2021-09-08 ENCOUNTER — Ambulatory Visit (HOSPITAL_BASED_OUTPATIENT_CLINIC_OR_DEPARTMENT_OTHER): Payer: Medicare HMO | Admitting: Registered Nurse

## 2021-09-08 DIAGNOSIS — I10 Essential (primary) hypertension: Secondary | ICD-10-CM | POA: Diagnosis not present

## 2021-09-08 DIAGNOSIS — I110 Hypertensive heart disease with heart failure: Secondary | ICD-10-CM | POA: Diagnosis not present

## 2021-09-08 DIAGNOSIS — G4730 Sleep apnea, unspecified: Secondary | ICD-10-CM | POA: Diagnosis not present

## 2021-09-08 DIAGNOSIS — N200 Calculus of kidney: Secondary | ICD-10-CM

## 2021-09-08 DIAGNOSIS — N202 Calculus of kidney with calculus of ureter: Secondary | ICD-10-CM | POA: Diagnosis not present

## 2021-09-08 DIAGNOSIS — I5022 Chronic systolic (congestive) heart failure: Secondary | ICD-10-CM | POA: Diagnosis not present

## 2021-09-08 DIAGNOSIS — I517 Cardiomegaly: Secondary | ICD-10-CM | POA: Diagnosis not present

## 2021-09-08 DIAGNOSIS — E119 Type 2 diabetes mellitus without complications: Secondary | ICD-10-CM | POA: Diagnosis not present

## 2021-09-08 HISTORY — PX: CYSTOSCOPY WITH RETROGRADE PYELOGRAM, URETEROSCOPY AND STENT PLACEMENT: SHX5789

## 2021-09-08 HISTORY — PX: STONE EXTRACTION WITH BASKET: SHX5318

## 2021-09-08 HISTORY — PX: HOLMIUM LASER APPLICATION: SHX5852

## 2021-09-08 LAB — GLUCOSE, CAPILLARY
Glucose-Capillary: 142 mg/dL — ABNORMAL HIGH (ref 70–99)
Glucose-Capillary: 156 mg/dL — ABNORMAL HIGH (ref 70–99)

## 2021-09-08 SURGERY — CYSTOURETEROSCOPY, WITH RETROGRADE PYELOGRAM AND STENT INSERTION
Anesthesia: General | Site: Ureter | Laterality: Left

## 2021-09-08 MED ORDER — PROPOFOL 10 MG/ML IV BOLUS
INTRAVENOUS | Status: DC | PRN
  Administered 2021-09-08: 150 mg via INTRAVENOUS

## 2021-09-08 MED ORDER — FENTANYL CITRATE (PF) 100 MCG/2ML IJ SOLN
INTRAMUSCULAR | Status: DC | PRN
  Administered 2021-09-08: 50 ug via INTRAVENOUS

## 2021-09-08 MED ORDER — PROPOFOL 10 MG/ML IV BOLUS
INTRAVENOUS | Status: AC
  Filled 2021-09-08: qty 20

## 2021-09-08 MED ORDER — LACTATED RINGERS IV SOLN: INTRAVENOUS | Status: DC

## 2021-09-08 MED ORDER — CEFAZOLIN SODIUM-DEXTROSE 2-4 GM/100ML-% IV SOLN
2.0000 g | INTRAVENOUS | Status: AC
  Administered 2021-09-08: 2 g via INTRAVENOUS
  Filled 2021-09-08: qty 100

## 2021-09-08 MED ORDER — GLYCOPYRROLATE PF 0.2 MG/ML IJ SOSY
PREFILLED_SYRINGE | INTRAMUSCULAR | Status: AC
  Filled 2021-09-08: qty 1

## 2021-09-08 MED ORDER — LIDOCAINE 2% (20 MG/ML) 5 ML SYRINGE
INTRAMUSCULAR | Status: DC | PRN
  Administered 2021-09-08: 80 mg via INTRAVENOUS

## 2021-09-08 MED ORDER — DEXAMETHASONE SODIUM PHOSPHATE 4 MG/ML IJ SOLN
INTRAMUSCULAR | Status: DC | PRN
  Administered 2021-09-08: 5 mg via INTRAVENOUS

## 2021-09-08 MED ORDER — CHLORHEXIDINE GLUCONATE 0.12 % MT SOLN: 15.0000 mL | Freq: Once | OROMUCOSAL | Status: DC

## 2021-09-08 MED ORDER — LIDOCAINE HCL (PF) 2 % IJ SOLN
INTRAMUSCULAR | Status: AC
  Filled 2021-09-08: qty 5

## 2021-09-08 MED ORDER — WATER FOR IRRIGATION, STERILE IR SOLN
Status: DC | PRN
  Administered 2021-09-08: 500 mL

## 2021-09-08 MED ORDER — OXYCODONE-ACETAMINOPHEN 5-325 MG PO TABS: 1.0000 | ORAL_TABLET | ORAL | 0 refills | Status: DC | PRN

## 2021-09-08 MED ORDER — DEXAMETHASONE SODIUM PHOSPHATE 10 MG/ML IJ SOLN
INTRAMUSCULAR | Status: AC
  Filled 2021-09-08: qty 1

## 2021-09-08 MED ORDER — GLYCOPYRROLATE PF 0.2 MG/ML IJ SOSY
PREFILLED_SYRINGE | INTRAMUSCULAR | Status: DC | PRN
  Administered 2021-09-08: .1 mg via INTRAVENOUS

## 2021-09-08 MED ORDER — SODIUM CHLORIDE 0.9 % IR SOLN
Status: DC | PRN
  Administered 2021-09-08 (×2): 3000 mL

## 2021-09-08 MED ORDER — FENTANYL CITRATE PF 50 MCG/ML IJ SOSY
25.0000 ug | PREFILLED_SYRINGE | INTRAMUSCULAR | Status: DC | PRN
  Administered 2021-09-08: 50 ug via INTRAVENOUS
  Filled 2021-09-08: qty 1

## 2021-09-08 MED ORDER — FENTANYL CITRATE (PF) 100 MCG/2ML IJ SOLN
INTRAMUSCULAR | Status: AC
  Filled 2021-09-08: qty 2

## 2021-09-08 MED ORDER — DIATRIZOATE MEGLUMINE 30 % UR SOLN
URETHRAL | Status: DC | PRN
  Administered 2021-09-08: 5 mL via URETHRAL

## 2021-09-08 MED ORDER — ONDANSETRON HCL 4 MG/2ML IJ SOLN
INTRAMUSCULAR | Status: AC
  Filled 2021-09-08: qty 2

## 2021-09-08 MED ORDER — ORAL CARE MOUTH RINSE: 15.0000 mL | Freq: Once | OROMUCOSAL | Status: DC

## 2021-09-08 MED ORDER — DIATRIZOATE MEGLUMINE 30 % UR SOLN
URETHRAL | Status: AC
  Filled 2021-09-08: qty 100

## 2021-09-08 MED ORDER — EPHEDRINE 5 MG/ML INJ
INTRAVENOUS | Status: AC
  Filled 2021-09-08: qty 5

## 2021-09-08 MED ORDER — EPHEDRINE SULFATE-NACL 50-0.9 MG/10ML-% IV SOSY
PREFILLED_SYRINGE | INTRAVENOUS | Status: DC | PRN
  Administered 2021-09-08 (×2): 5 mg via INTRAVENOUS

## 2021-09-08 MED ORDER — ONDANSETRON HCL 4 MG/2ML IJ SOLN: 4.0000 mg | Freq: Once | INTRAMUSCULAR | Status: DC | PRN

## 2021-09-08 MED ORDER — ONDANSETRON HCL 4 MG/2ML IJ SOLN
INTRAMUSCULAR | Status: DC | PRN
  Administered 2021-09-08: 4 mg via INTRAVENOUS

## 2021-09-08 SURGICAL SUPPLY — 26 items
BAG DRAIN URO TABLE W/ADPT NS (BAG) ×2 IMPLANT
BAG DRN 8 ADPR NS SKTRN CSTL (BAG) ×1
BAG HAMPER (MISCELLANEOUS) ×2 IMPLANT
CATH INTERMIT  6FR 70CM (CATHETERS) ×2 IMPLANT
CLOTH BEACON ORANGE TIMEOUT ST (SAFETY) ×2 IMPLANT
EXTRACTOR STONE NITINOL NGAGE (UROLOGICAL SUPPLIES) ×1 IMPLANT
GLOVE BIO SURGEON STRL SZ8 (GLOVE) ×2 IMPLANT
GLOVE BIOGEL PI IND STRL 7.0 (GLOVE) ×2 IMPLANT
GLOVE BIOGEL PI INDICATOR 7.0 (GLOVE) ×2
GOWN STRL REUS W/TWL LRG LVL3 (GOWN DISPOSABLE) ×2 IMPLANT
GOWN STRL REUS W/TWL XL LVL3 (GOWN DISPOSABLE) ×2 IMPLANT
GUIDEWIRE STR DUAL SENSOR (WIRE) ×2 IMPLANT
GUIDEWIRE STR ZIPWIRE 035X150 (MISCELLANEOUS) ×2 IMPLANT
IV NS IRRIG 3000ML ARTHROMATIC (IV SOLUTION) ×4 IMPLANT
KIT TURNOVER CYSTO (KITS) ×2 IMPLANT
MANIFOLD NEPTUNE II (INSTRUMENTS) ×2 IMPLANT
PACK CYSTO (CUSTOM PROCEDURE TRAY) ×2 IMPLANT
PAD ARMBOARD 7.5X6 YLW CONV (MISCELLANEOUS) ×2 IMPLANT
SHEATH URETERAL 12FRX35CM (MISCELLANEOUS) ×1 IMPLANT
STENT URET 6FRX24 CONTOUR (STENTS) ×1 IMPLANT
SYR 10ML LL (SYRINGE) ×2 IMPLANT
SYR CONTROL 10ML LL (SYRINGE) ×2 IMPLANT
TOWEL OR 17X26 4PK STRL BLUE (TOWEL DISPOSABLE) ×2 IMPLANT
TRACTIP FLEXIVA PULS ID 200XHI (Laser) IMPLANT
TRACTIP FLEXIVA PULSE ID 200 (Laser) ×2
WATER STERILE IRR 500ML POUR (IV SOLUTION) ×2 IMPLANT

## 2021-09-08 NOTE — Anesthesia Preprocedure Evaluation (Signed)
Anesthesia Evaluation  Patient identified by MRN, date of birth, ID band Patient awake    Reviewed: Allergy & Precautions, H&P , NPO status , Patient's Chart, lab work & pertinent test results, reviewed documented beta blocker date and time   Airway Mallampati: II  TM Distance: >3 FB Neck ROM: full    Dental no notable dental hx.    Pulmonary asthma , sleep apnea ,    Pulmonary exam normal breath sounds clear to auscultation       Cardiovascular Exercise Tolerance: Good hypertension, negative cardio ROS   Rhythm:regular Rate:Normal     Neuro/Psych  Headaches,  Neuromuscular disease CVA negative psych ROS   GI/Hepatic Neg liver ROS, GERD  ,  Endo/Other  negative endocrine ROSdiabetes, Type 2  Renal/GU negative Renal ROS  negative genitourinary   Musculoskeletal   Abdominal   Peds  Hematology negative hematology ROS (+)   Anesthesia Other Findings 1. Left ventricular ejection fraction, by visual estimation, is 60 to  65%. The left ventricle has normal function. There is mildly increased  left ventricular hypertrophy.  2. Elevated left ventricular end-diastolic pressure.  3. Left ventricular diastolic parameters are consistent with Grade I  diastolic dysfunction (impaired relaxation).  4. The left ventricle has no regional wall motion abnormalities.  5. Global right ventricle has normal systolic function.The right  ventricular size is normal. No increase in right ventricular wall  thickness.  6. Left atrial size was normal.  7. Right atrial size was normal.  8. The mitral valve is grossly normal. Mild mitral valve regurgitation.  9. The tricuspid valve is grossly normal. Tricuspid valve regurgitation  is trivial.  10. The aortic valve is tricuspid. Aortic valve regurgitation is not  visualized.  11. The pulmonic valve was grossly normal. Pulmonic valve regurgitation is  not visualized.  12. The  inferior vena cava is normal in size with greater than 50%  respiratory variability, suggesting right atrial pressure of 3 mmHg.   Reproductive/Obstetrics negative OB ROS                             Anesthesia Physical  Anesthesia Plan  ASA: 3  Anesthesia Plan: General and General LMA   Post-op Pain Management:    Induction:   PONV Risk Score and Plan: Ondansetron  Airway Management Planned:   Additional Equipment:   Intra-op Plan:   Post-operative Plan:   Informed Consent: I have reviewed the patients History and Physical, chart, labs and discussed the procedure including the risks, benefits and alternatives for the proposed anesthesia with the patient or authorized representative who has indicated his/her understanding and acceptance.     Dental Advisory Given  Plan Discussed with: CRNA  Anesthesia Plan Comments:         Anesthesia Quick Evaluation  

## 2021-09-08 NOTE — Discharge Instructions (Signed)
Pull Stent out Monday 09/12/2021

## 2021-09-08 NOTE — Anesthesia Procedure Notes (Signed)
Procedure Name: LMA Insertion Date/Time: 09/08/2021 9:54 AM Performed by: Orlie Dakin, CRNA Pre-anesthesia Checklist: Patient identified, Emergency Drugs available, Suction available and Patient being monitored Patient Re-evaluated:Patient Re-evaluated prior to induction Oxygen Delivery Method: Circle system utilized Preoxygenation: Pre-oxygenation with 100% oxygen Induction Type: IV induction Ventilation: Mask ventilation without difficulty LMA: LMA inserted LMA Size: 4.0 Tube type: Oral Number of attempts: 1 Placement Confirmation: positive ETCO2 Tube secured with: Tape Dental Injury: Teeth and Oropharynx as per pre-operative assessment

## 2021-09-08 NOTE — Interval H&P Note (Signed)
History and Physical Interval Note:  09/08/2021 8:36 AM  [name]  has presented today for surgery, with the diagnosis of left renal calculus.  The various methods of treatment have been discussed with the patient and family. After consideration of risks, benefits and other options for treatment, the patient has consented to  Procedure(s): CYSTOSCOPY WITH RETROGRADE PYELOGRAM, URETEROSCOPY AND STENT EXCHANGE (Left) HOLMIUM LASER APPLICATION (Left) as a surgical intervention.  The patient's history has been reviewed, patient examined, no change in status, stable for surgery.  I have reviewed the patient's chart and labs.  Questions were answered to the patient's satisfaction.     Nicolette Bang

## 2021-09-08 NOTE — Transfer of Care (Signed)
Immediate Anesthesia Transfer of Care Note  Patient: MAECIE SEVCIK  Procedure(s) Performed: CYSTOSCOPY WITH RETROGRADE PYELOGRAM, URETEROSCOPY AND STENT EXCHANGE (Left: Ureter) HOLMIUM LASER APPLICATION (Left: Ureter) STONE EXTRACTION WITH BASKET (Left: Ureter)  Patient Location: PACU  Anesthesia Type:General  Level of Consciousness: awake, alert  and oriented  Airway & Oxygen Therapy: Patient Spontanous Breathing and Patient connected to nasal cannula oxygen  Post-op Assessment: Report given to RN and Post -op Vital signs reviewed and stable  Post vital signs: Reviewed and stable  Last Vitals:  Vitals Value Taken Time  BP 151/64 09/08/21 1115  Temp 37.4 C 09/08/21 1114  Pulse 75 09/08/21 1115  Resp 12 09/08/21 1115  SpO2 98 % 09/08/21 1115    Last Pain:  Vitals:   09/08/21 1114  TempSrc: Oral  PainSc: 0-No pain         Complications: No notable events documented.

## 2021-09-08 NOTE — Op Note (Signed)
.  Preoperative diagnosis: Left renal stone  Postoperative diagnosis: Same  Procedure: 1 cystoscopy 2. Left retrograde pyelography 3.  Intraoperative fluoroscopy, under one hour, with interpretation 4.  Left ureteroscopic stone manipulation with laser lithotripsy 5.  Left 6 x 26 JJ stent placement  Attending: Rosie Fate  Anesthesia: General  Estimated blood loss: None  Drains: Left 6 x 26 JJ ureteral stent with tether  Specimens: stone for analysis  Antibiotics: ancef  Findings: 2 3-72m left proximal ureteral calculi, left lower pole stone. No hydronephrosis. No masses/lesions in the bladder. Ureteral orifices in normal anatomic location.  Indications: Patient is a 76year old female with a history of left renal stones who underwent staged ureteroscopy for a large partial staghorn calculus.  After discussing treatment options, she decided proceed with left ureteroscopic stone manipulation.  Procedure in detail: The patient was brought to the operating room and a brief timeout was done to ensure correct patient, correct procedure, correct site.  General anesthesia was administered patient was placed in dorsal lithotomy position.  Her genitalia was then prepped and draped in usual sterile fashion.  A rigid 262French cystoscope was passed in the urethra and the bladder.  Bladder was inspected free masses or lesions.  the ureteral orifices were in the normal orthotopic locations.  a 6 french ureteral catheter was then instilled into the left ureteral orifice.  a gentle retrograde was obtained and findings noted above. Using a grasper the left ureteral stent was brought to the urethral meatus.  we then placed a zip wire through the ureteral stent and advanced up to the renal pelvis. The stent was removed.  we then removed the cystoscope and cannulated the left ureteral orifice with a semirigid ureteroscope.  We encountered 2 calculi in the ureter which were removed with an NGage basket.  Once we reached the UPJ a sensor wire was advanced in to the renal pelvis. We then removed the ureteroscope and advanced am 12/14 x 35cm access sheath up to the renal pelvis. We then used the flexible ureteroscope to perform nephroscopy. We encountered the stone in the lower pole.  Using a 242nm laser fiber the stones were fragmented.  the fragments were then removed with a Ngage basket.    once all stone fragments were removed we then removed the access sheath under direct vision and noted no injury to the ureter. We then placed a 6 x 26 double-j ureteral stent over the original zip wire.  We then removed the wire and good coil was noted in the the renal pelvis under fluoroscopy and the bladder under direct vision. the bladder was then drained and this concluded the procedure which was well tolerated by patient.  Complications: None  Condition: Stable, extubated, transferred to PACU  Plan: Patient is to be discharged home as to follow-up in one week. She is to remove her stent in 72 hours by pulling the tether

## 2021-09-08 NOTE — Progress Notes (Signed)
Dr Alyson Ingles aware of PCN allergy. Had ancef with last procedure. Did fine . No reaction documented. Will proceed with ancef infusion today.

## 2021-09-09 ENCOUNTER — Encounter (HOSPITAL_COMMUNITY): Payer: Self-pay | Admitting: Urology

## 2021-09-09 NOTE — Anesthesia Postprocedure Evaluation (Signed)
Anesthesia Post Note  Patient: [name]  Procedure(s) Performed: CYSTOSCOPY WITH RETROGRADE PYELOGRAM, URETEROSCOPY AND STENT EXCHANGE (Left: Ureter) HOLMIUM LASER APPLICATION (Left: Ureter) STONE EXTRACTION WITH BASKET (Left: Ureter)  Patient location during evaluation: Phase II Anesthesia Type: General Level of consciousness: awake Pain management: pain level controlled Vital Signs Assessment: post-procedure vital signs reviewed and stable Respiratory status: spontaneous breathing and respiratory function stable Cardiovascular status: blood pressure returned to baseline and stable Postop Assessment: no headache and no apparent nausea or vomiting Anesthetic complications: no Comments: Late entry   No notable events documented.   Last Vitals:  Vitals:   09/08/21 1145 09/08/21 1158  BP: (!) 158/73 (!) 150/63  Pulse: 73 72  Resp: 11 18  Temp:  36.6 C  SpO2: 97% 98%    Last Pain:  Vitals:   09/08/21 1158  TempSrc: Oral  PainSc: 0-No pain                 Louann Sjogren

## 2021-09-13 LAB — CALCULI, WITH PHOTOGRAPH (CLINICAL LAB)
Calcium Oxalate Dihydrate: 10 %
Calcium Oxalate Monohydrate: 90 %
Weight Calculi: 122 mg

## 2021-09-14 ENCOUNTER — Ambulatory Visit (INDEPENDENT_AMBULATORY_CARE_PROVIDER_SITE_OTHER): Payer: Medicare HMO | Admitting: Urology

## 2021-09-14 ENCOUNTER — Encounter: Payer: Self-pay | Admitting: Urology

## 2021-09-14 VITALS — BP 144/64 | HR 49

## 2021-09-14 DIAGNOSIS — N3000 Acute cystitis without hematuria: Secondary | ICD-10-CM | POA: Diagnosis not present

## 2021-09-14 DIAGNOSIS — N200 Calculus of kidney: Secondary | ICD-10-CM

## 2021-09-14 LAB — MICROSCOPIC EXAMINATION
Renal Epithel, UA: NONE SEEN /hpf
WBC, UA: >30 /hpf — AB (ref 0–5)

## 2021-09-14 LAB — URINALYSIS, ROUTINE W REFLEX MICROSCOPIC
Bilirubin, UA: NEGATIVE
Glucose, UA: NEGATIVE
Ketones, UA: NEGATIVE
Nitrite, UA: NEGATIVE
Specific Gravity, UA: 1.015 (ref 1.005–1.030)
Urobilinogen, Ur: 0.2 mg/dL (ref 0.2–1.0)
pH, UA: 5.5 (ref 5.0–7.5)

## 2021-09-14 NOTE — Patient Instructions (Signed)
Dietary Guidelines to Help Prevent Kidney Stones Kidney stones are deposits of minerals and salts that form inside your kidneys. Your risk of developing kidney stones may be greater depending on your diet, your lifestyle, the medicines you take, and whether you have certain medical conditions. Most people can lower their chances of developing kidney stones by following the instructions below. Your dietitian may give you more specific instructions depending on your overall health and the type of kidney stones you tend to develop. What are tips for following this plan? Reading food labels  Choose foods with "no salt added" or "low-salt" labels. Limit your salt (sodium) intake to less than 1,500 mg a day. Choose foods with calcium for each meal and snack. Try to eat about 300 mg of calcium at each meal. Foods that contain 200-500 mg of calcium a serving include: 8 oz (237 mL) of milk, calcium-fortifiednon-dairy milk, and calcium-fortifiedfruit juice. Calcium-fortified means that calcium has been added to these drinks. 8 oz (237 mL) of kefir, yogurt, and soy yogurt. 4 oz (114 g) of tofu. 1 oz (28 g) of cheese. 1 cup (150 g) of dried figs. 1 cup (91 g) of cooked broccoli. One 3 oz (85 g) can of sardines or mackerel. Most people need 1,000-1,500 mg of calcium a day. Talk to your dietitian about how much calcium is recommended for you. Shopping Buy plenty of fresh fruits and vegetables. Most people do not need to avoid fruits and vegetables, even if these foods contain nutrients that may contribute to kidney stones. When shopping for convenience foods, choose: Whole pieces of fruit. Pre-made salads with dressing on the side. Low-fat fruit and yogurt smoothies. Avoid buying frozen meals or prepared deli foods. These can be high in sodium. Look for foods with live cultures, such as yogurt and kefir. Choose high-fiber grains, such as whole-wheat breads, oat bran, and wheat cereals. Cooking Do not add  salt to food when cooking. Place a salt shaker on the table and allow each person to add his or her own salt to taste. Use vegetable protein, such as beans, textured vegetable protein (TVP), or tofu, instead of meat in pasta, casseroles, and soups. Meal planning Eat less salt, if told by your dietitian. To do this: Avoid eating processed or pre-made food. Avoid eating fast food. Eat less animal protein, including cheese, meat, poultry, or fish, if told by your dietitian. To do this: Limit the number of times you have meat, poultry, fish, or cheese each week. Eat a diet free of meat at least 2 days a week. Eat only one serving each day of meat, poultry, fish, or seafood. When you prepare animal protein, cut pieces into small portion sizes. For most meat and fish, one serving is about the size of the palm of your hand. Eat at least five servings of fresh fruits and vegetables each day. To do this: Keep fruits and vegetables on hand for snacks. Eat one piece of fruit or a handful of berries with breakfast. Have a salad and fruit at lunch. Have two kinds of vegetables at dinner. Limit foods that are high in a substance called oxalate. These include: Spinach (cooked), rhubarb, beets, sweet potatoes, and Swiss chard. Peanuts. Potato chips, french fries, and baked potatoes with skin on. Nuts and nut products. Chocolate. If you regularly take a diuretic medicine, make sure to eat at least 1 or 2 servings of fruits or vegetables that are high in potassium each day. These include: Avocado. Banana. Orange, prune,   carrot, or tomato juice. Baked potato. Cabbage. Beans and split peas. Lifestyle  Drink enough fluid to keep your urine pale yellow. This is the most important thing you can do. Spread your fluid intake throughout the day. If you drink alcohol: Limit how much you use to: 0-1 drink a day for women who are not pregnant. 0-2 drinks a day for men. Be aware of how much alcohol is in your  drink. In the [place] one drink equals one 12 oz bottle of beer (355 mL), one 5 oz glass of wine (148 mL), or one 1 oz glass of hard liquor (44 mL). Lose weight if told by your health care provider. Work with your dietitian to find an eating plan and weight loss strategies that work best for you. General information Talk to your health care provider and dietitian about taking daily supplements. You may be told the following depending on your health and the cause of your kidney stones: Not to take supplements with vitamin C. To take a calcium supplement. To take a daily probiotic supplement. To take other supplements such as magnesium, fish oil, or vitamin B6. Take over-the-counter and prescription medicines only as told by your health care provider. These include supplements. What foods should I limit? Limit your intake of the following foods, or eat them as told by your dietitian. Vegetables Spinach. Rhubarb. Beets. Canned vegetables. Pickles. Olives. Baked potatoes with skin. Grains Wheat bran. Baked goods. Salted crackers. Cereals high in sugar. Meats and other proteins Nuts. Nut butters. Large portions of meat, poultry, or fish. Salted, precooked, or cured meats, such as sausages, meat loaves, and hot dogs. Dairy Cheese. Beverages Regular soft drinks. Regular vegetable juice. Seasonings and condiments Seasoning blends with salt. Salad dressings. Soy sauce. Ketchup. Barbecue sauce. Other foods Canned soups. Canned pasta sauce. Casseroles. Pizza. Lasagna. Frozen meals. Potato chips. French fries. The items listed above may not be a complete list of foods and beverages you should limit. Contact a dietitian for more information. What foods should I avoid? Talk to your dietitian about specific foods you should avoid based on the type of kidney stones you have and your overall health. Fruits Grapefruit. The item listed above may not be a complete list of foods and beverages you should  avoid. Contact a dietitian for more information. Summary Kidney stones are deposits of minerals and salts that form inside your kidneys. You can lower your risk of kidney stones by making changes to your diet. The most important thing you can do is drink enough fluid. Drink enough fluid to keep your urine pale yellow. Talk to your dietitian about how much calcium you should have each day, and eat less salt and animal protein as told by your dietitian. This information is not intended to replace advice given to you by your health care provider. Make sure you discuss any questions you have with your health care provider. Document Revised: 12/06/2020 Document Reviewed: 12/06/2020 Elsevier Patient Education  2023 Elsevier Inc.  

## 2021-09-14 NOTE — Progress Notes (Signed)
09/14/2021 1:23 PM   Christella Noa 1946/03/29 [id number]  Referring provider: Sharion Balloon, [address] [place],  [place] [address]  nephrolithiasis   HPI: [name] is a 22JS here for followup for nephrolithiasis. She underwent left stone extraction last week and removed her stent after 3 days. She denies any flank pain.  She has 3 right renal calculi. No significant LUTS.    PMH: Past Medical History:  Diagnosis Date   Asthma    Chronic systolic heart failure ([facility] [place])    Echo (08/26/13):  Mild LVH. EF 25% to 30%. Diffuse HK. Aortic valve: There was trivial regurgitation. Mitral valve: There was mild regurgitation. Left atrium: The atrium was mildly dilated.   GERD (gastroesophageal reflux disease)    Headache(784.0)    History of nephrolithiasis    Hypertension    NICM (nonischemic cardiomyopathy) ([place])    a. LHC (08/2013):  no CAD   Sleep apnea    does not use machine   Stroke ([facility])    Type II or unspecified type diabetes mellitus without mention of complication, uncontrolled    borderline    Surgical History: Past Surgical History:  Procedure Laterality Date   ABDOMINAL HYSTERECTOMY     COLONOSCOPY WITH PROPOFOL N/A 07/01/2019   Procedure: COLONOSCOPY WITH PROPOFOL;  Surgeon: Danie Binder, MD;  [facility];  Service: Endoscopy;  Laterality: N/A;  2:15pm - pt will not move up   [place], URETEROSCOPY AND STENT PLACEMENT Left 08/11/2021   Procedure: [place], URETEROSCOPY AND STENT PLACEMENT;  Surgeon: Cleon Gustin, MD;  Location: [facility];  Service: Urology;  Laterality: Left;   CYSTOSCOPY WITH RETROGRADE PYELOGRAM, URETEROSCOPY AND STENT PLACEMENT Left 09/08/2021   Procedure: CYSTOSCOPY WITH RETROGRADE PYELOGRAM, URETEROSCOPY AND STENT EXCHANGE;  Surgeon: Cleon Gustin, MD;  Location: [facility];  Service: Urology;  Laterality: Left;   EXTRACORPOREAL SHOCK WAVE LITHOTRIPSY Left  05/31/2021   Procedure: EXTRACORPOREAL SHOCK WAVE LITHOTRIPSY (ESWL);  Surgeon: Cleon Gustin, MD;  Location: [facility];  Service: Urology;  Laterality: Left;   EXTRACORPOREAL SHOCK WAVE LITHOTRIPSY Left 06/28/2021   Procedure: EXTRACORPOREAL SHOCK WAVE LITHOTRIPSY (ESWL);  Surgeon: Cleon Gustin, MD;  Location: [facility];  Service: Urology;  Laterality: Left;   HOLMIUM LASER APPLICATION Left 05/18/3149   Procedure: HOLMIUM LASER APPLICATION;  Surgeon: Cleon Gustin, MD;  Location: [facility];  Service: Urology;  Laterality: Left;   HOLMIUM LASER APPLICATION Left 10/13/1605   Procedure: HOLMIUM LASER APPLICATION;  Surgeon: Cleon Gustin, MD;  Location: [facility];  Service: Urology;  Laterality: Left;   LEFT AND RIGHT HEART CATHETERIZATION WITH CORONARY ANGIOGRAM N/A 09/03/2013   Procedure: LEFT AND RIGHT HEART CATHETERIZATION WITH CORONARY ANGIOGRAM;  Surgeon: [name], MD;  Location: [facility] CATH LAB;  Service: Cardiovascular;  Laterality: N/A;   OOPHORECTOMY     removed 1 ovary and 1 ovary remains.   POLYPECTOMY  07/01/2019   Procedure: POLYPECTOMY;  Surgeon: Danie Binder, MD;  [facility];  Service: Endoscopy;;   STONE EXTRACTION WITH BASKET Left 09/08/2021   Procedure: STONE EXTRACTION WITH BASKET;  Surgeon: Cleon Gustin, MD;  Location: [facility];  Service: Urology;  Laterality: Left;   ureteral extraction of kidney stone      Home Medications:  Allergies as of 09/14/2021       Reactions   Levaquin [levofloxacin In D5w] Diarrhea   Penicillins Rash  Medication List        Accurate as of September 14, 2021  1:23 PM. If you have any questions, ask your nurse or doctor.          acetaminophen 500 MG tablet Commonly known as: TYLENOL Take 1,000 mg by mouth every 6 (six) hours as needed for moderate pain or headache.   albuterol 108 (90 Base) MCG/ACT inhaler Commonly known as: VENTOLIN HFA Inhale 2 puffs into the lungs every 6 (six) hours as  needed for wheezing or shortness of breath.   alendronate 70 MG tablet Commonly known as: Fosamax Take 1 tablet (70 mg total) by mouth every 7 (seven) days. Take with a full glass of water on an empty stomach.   amLODipine 10 MG tablet Commonly known as: NORVASC Take 1 tablet (10 mg total) by mouth daily.   bisoprolol 10 MG tablet Commonly known as: ZEBETA Take 1 tablet (10 mg total) by mouth daily.   diclofenac 75 MG EC tablet Commonly known as: VOLTAREN Take 1 tablet (75 mg total) by mouth 2 (two) times daily.   diclofenac Sodium 1 % Gel Commonly known as: Voltaren Apply 2 g topically 4 (four) times daily.   Entresto 97-103 MG Generic drug: sacubitril-valsartan Take 1 tablet by mouth twice daily   glucose blood test strip Commonly known as: OneTouch Verio Use as instructed   HYDROcodone-acetaminophen 5-325 MG tablet Commonly known as: Norco Take 1 tablet by mouth every 6 (six) hours as needed for moderate pain.   lisinopril 10 MG tablet Commonly known as: ZESTRIL Take 10 mg by mouth daily.   OneTouch Delica Lancets Fine Misc Use to check BG once daily   oxybutynin 10 MG 24 hr tablet Commonly known as: Ditropan XL Take 1 tablet (10 mg total) by mouth daily.   oxyCODONE-acetaminophen 5-325 MG tablet Commonly known as: Percocet Take 1 tablet by mouth every 4 (four) hours as needed for severe pain.   rosuvastatin 10 MG tablet Commonly known as: Crestor Take 1 tablet (10 mg total) by mouth daily.   tamsulosin 0.4 MG Caps capsule Commonly known as: Flomax Take 1 capsule (0.4 mg total) by mouth daily after supper.   Vitamin D (Ergocalciferol) 1.25 MG (50000 UNIT) Caps capsule Commonly known as: DRISDOL Take 1 capsule (50,000 Units total) by mouth every 7 (seven) days.        Allergies:  Allergies  Allergen Reactions   Levaquin [Levofloxacin In D5w] Diarrhea   Penicillins Rash    Family History: Family History  Problem Relation Age of Onset    Hypertension Mother    Arthritis Mother    CAD Mother 70   Hip fracture Mother 18   COPD Father    Crohn's disease Sister    Cancer Sister        lung   Early death Brother 65       house fire   Diabetes Maternal Grandmother    Hypertension Sister    Hypertension Brother    Colon cancer Neg Hx     Social History:  reports that she has never smoked. She has never used smokeless tobacco. She reports current alcohol use. She reports that she does not use drugs.  ROS: All other review of systems were reviewed and are negative except what is noted above in HPI  Physical Exam: BP (!) 144/64   Pulse (!) 49   LMP 01/22/1981 (LMP Unknown)   Constitutional:  Alert and oriented, No acute distress. HEENT: [place]  AT, moist mucus membranes.  Trachea midline, no masses. Cardiovascular: No clubbing, cyanosis, or edema. Respiratory: Normal respiratory effort, no increased work of breathing. GI: Abdomen is soft, nontender, nondistended, no abdominal masses GU: No CVA tenderness.  Lymph: No cervical or inguinal lymphadenopathy. Skin: No rashes, bruises or suspicious lesions. Neurologic: Grossly intact, no focal deficits, moving all 4 extremities. Psychiatric: Normal mood and affect.  Laboratory Data: Lab Results  Component Value Date   WBC 8.2 05/05/2021   HGB 14.5 05/05/2021   HCT 43.6 05/05/2021   MCV 87 05/05/2021   PLT 268 05/05/2021    Lab Results  Component Value Date   CREATININE 0.90 08/08/2021    No results found for: PSA  No results found for: TESTOSTERONE  Lab Results  Component Value Date   HGBA1C 6.9 (H) 08/08/2021    Urinalysis    Component Value Date/Time   COLORURINE YELLOW 12/16/2013 1120   APPEARANCEUR Hazy (A) 08/29/2021 1547   LABSPEC 1.013 12/16/2013 1120   PHURINE 6.5 12/16/2013 1120   GLUCOSEU Negative 08/29/2021 1547   HGBUR MODERATE (A) 12/16/2013 1120   BILIRUBINUR Negative 08/29/2021 1547   KETONESUR NEGATIVE 12/16/2013 1120   PROTEINUR 3+  (A) 08/29/2021 1547   PROTEINUR 30 (A) 12/16/2013 1120   UROBILINOGEN negative 07/24/2014 1519   UROBILINOGEN 0.2 12/16/2013 1120   NITRITE Negative 08/29/2021 1547   NITRITE NEGATIVE 12/16/2013 1120   LEUKOCYTESUR 3+ (A) 08/29/2021 1547    Lab Results  Component Value Date   LABMICR See below: 08/29/2021   WBCUA 11-30 (A) 08/29/2021   LABEPIT 0-10 08/29/2021   MUCUS Present 08/29/2021   BACTERIA Moderate (A) 08/29/2021    Pertinent Imaging:  Results for orders placed during the hospital encounter of 07/13/21  DG Abd 1 View  Narrative CLINICAL DATA:  Post lithotripsy with continued left flank pain.  EXAM: ABDOMEN - 1 VIEW  COMPARISON:  None.  FINDINGS: The bowel gas pattern is normal.  Right renal calculi are unchanged largest measuring 2.4 cm in transverse dimension.  Numerous left renal calculi are again seen measuring up to 1 cm. The majority are unchanged and measuring up to 1 cm. Overall, slightly decreased in size and number.  Osseous structures are stable.  IMPRESSION: 1. Left renal calculi have mildly decreased in size and number. 2. Stable right renal calculi.   Electronically Signed By: Ronney Asters M.D. On: 07/14/2021 17:12  No results found for this or any previous visit.  No results found for this or any previous visit.  No results found for this or any previous visit.  No results found for this or any previous visit.  No results found for this or any previous visit.  Results for orders placed in visit on 05/18/21  CT HEMATURIA WORKUP  Narrative CLINICAL DATA:  Hematuria over the last 3 weeks. History of diabetes and hypertension. Cystitis. Urinary incontinence.  EXAM: CT ABDOMEN AND PELVIS WITHOUT AND WITH CONTRAST  TECHNIQUE: Multidetector CT imaging of the abdomen and pelvis was performed following the standard protocol before and following the bolus administration of intravenous contrast.  RADIATION DOSE REDUCTION: This  exam was performed according to the departmental dose-optimization program which includes automated exposure control, adjustment of the mA and/or kV according to patient size and/or use of iterative reconstruction technique.  CONTRAST:  149m OMNIPAQUE IOHEXOL 300 MG/ML  SOLN  COMPARISON:  CT abdomen 10/04/2001  FINDINGS: Lower chest: Mild scarring in the right middle lobe, right lower lobe, and lingula. 4 by  3 mm pleural-based nodule along the left lower lobe on image 61 series 15, not changed from 05/11/2020. Small to moderate-sized hiatal hernia. Mild cardiomegaly. Descending thoracic aortic atherosclerosis.  Hepatobiliary: Contracted gallbladder.  Otherwise unremarkable.  Pancreas: Unremarkable  Spleen: Unremarkable  Adrenals/Urinary Tract: Both adrenal glands appear normal.  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 A branching 2.2 by 1.7 cm collecting system staghorn calculus in the right renal pelvis is associated with lower pole and mid kidney caliectasis  2.5 by 1.8 cm filling defect in the right mid to lower kidney infundibulum and calyx proximal to the stone on image 56 series 16 does not appear to enhance and accordingly favors plan thrombus over tumor. Similar filling defect is observed in much of the anterior kidney collecting system as shown for example on image 37 series 13. Within the blunted calices the presumed clot as outlined by excreted contrast.  There calcifications near the distal ureters but not within the distal ureters them cells. A separate right renal calculus is nonobstructive and measures 3 mm in diameter on image 34 series 2.  Which like the contralateral side, there is a staghorn calculus in the left mid kidney collecting system abutting the UPJ, measuring 1.7 cm in long axis. Release 5 additional nonobstructive renal calculi are present in the left kidney. There is no overt left hydronephrosis nor do we demonstrate additional  casting filling defects in the left kidney. There is some mild left caliectasis particularly in upper pole example on image 32 series 13.  There is abnormal wall thickening in the right proximal ureter extending along 4.3 cm from the stone, possibly from inflammatory wall thickening, less likely to be from thrombus or tumor. Similar appearance on the left extending along 3.4 cm distal to the collecting system calculus in the left proximal ureter with only a thin channel of contrast in this vicinity.  Urinary bladder unremarkable.  Stomach/Bowel: Hiatal hernia.  Sigmoid colon diverticulosis.  Vascular/Lymphatic: Atherosclerosis is present, including aortoiliac atherosclerotic disease and atherosclerotic calcifications in branches of the abdominal aorta.  Reproductive: Uterus absent.  Adnexa unremarkable.  Other: No supplemental non-categorized findings.  Musculoskeletal: Thoracic and lumbar spondylosis and degenerative disc disease.  IMPRESSION: 1. Bilateral staghorn calculi in the collecting systems extending to the UPJ on both sides. These are associated with abnormal thickening of the proximal ureters bilaterally, as well as mild bilateral hydronephrosis. 2. In the right renal collecting system, the large staghorn calculus is associated with abnormal noncalcified filling defect in anterior infundibula and calices probably from blood clot given the fact that this abnormal density filling the infundibulum and calices is not appreciably enhancing. Difficult to exclude small amounts of tumor in this region given the substantially abnormal appearance. See for example image 37 series 13. 3. Renal atrophy and scarring with renal sinus lipomatosis. 4. Additional nonobstructive renal calculi are present bilaterally. 5. Stable 4 by 3 mm pleural-based nodule in the left lower lobe, not changed over the last year, thought to likely be benign. 6. Mild bibasilar scarring. 7. Other  imaging findings of potential clinical significance: Small to moderate-sized hiatal hernia. Mild cardiomegaly. Sigmoid colon diverticulosis. Aortic Atherosclerosis (ICD10-I70.0). Systemic atherosclerosis. Thoracolumbar spondylosis and degenerative disc disease.   Electronically Signed By: Van Clines M.D. On: 05/23/2021 14:17  No results found for this or any previous visit.   Assessment & Plan:    1. Nephrolithiasis -We discussed the management of kidney stones. These options include observation, ureteroscopy, shockwave lithotripsy (ESWL) and percutaneous nephrolithotomy (PCNL).  We discussed which options are relevant to the patient's stone(s). We discussed the natural history of kidney stones as well as the complications of untreated stones and the impact on quality of life without treatment as well as with each of the above listed treatments. We also discussed the efficacy of each treatment in its ability to clear the stone burden. With any of these management options I discussed the signs and symptoms of infection and the need for emergent treatment should these be experienced. For each option we discussed the ability of each procedure to clear the patient of their stone burden.   For observation I described the risks which include but are not limited to silent renal damage, life-threatening infection, need for emergent surgery, failure to pass stone and pain.   For ureteroscopy I described the risks which include bleeding, infection, damage to contiguous structures, positioning injury, ureteral stricture, ureteral avulsion, ureteral injury, need for prolonged ureteral stent, inability to perform ureteroscopy, need for an interval procedure, inability to clear stone burden, stent discomfort/pain, heart attack, stroke, pulmonary embolus and the inherent risks with general anesthesia.   For shockwave lithotripsy I described the risks which include arrhythmia, kidney contusion, kidney  hemorrhage, need for transfusion, pain, inability to adequately break up stone, inability to pass stone fragments, [place]str[place], infection associated with obstructing stones, need for alternate surgical procedure, need for repeat shockwave lithotripsy, MI, CVA, PE and the inherent risks with anesthesia/conscious sedation.   For PCNL I described the risks including positioning injury, pneumothorax, hydrothorax, need for chest tube, inability to clear stone burden, renal laceration, arterial venous fistula or malformation, need for embolization of kidney, loss of kidney or renal function, need for repeat procedure, need for prolonged nephrostomy tube, ureteral avulsion, MI, CVA, PE and the inherent risks of general anesthesia.   - The patient would like to proceed with right ureteroscopic stone extraction - Urinalysis, Routine w reflex microscopic   No follow-ups on file.  Nicolette Bang, MD  [facility] [place]

## 2021-09-14 NOTE — H&P (View-Only) (Signed)
09/14/2021 1:23 PM   [name] 1946/01/28 [id number]  Referring provider: Sharion Balloon, [place] [address],  [place] [address]  nephrolithiasis   HPI: Ms Ragin is a 31IZ here for followup for nephrolithiasis. She underwent left stone extraction last week and removed her stent after 3 days. She denies any flank pain.  She has 3 right renal calculi. No significant LUTS.    PMH: Past Medical History:  Diagnosis Date   Asthma    Chronic systolic heart failure ([place])    Echo (08/26/13):  Mild LVH. EF 25% to 30%. Diffuse HK. Aortic valve: There was trivial regurgitation. Mitral valve: There was mild regurgitation. Left atrium: The atrium was mildly dilated.   GERD (gastroesophageal reflux disease)    Headache(784.0)    History of nephrolithiasis    Hypertension    NICM (nonischemic cardiomyopathy) ([place])    a. LHC (08/2013):  no CAD   Sleep apnea    does not use machine   Stroke ([facility])    Type II or unspecified type diabetes mellitus without mention of complication, uncontrolled    borderline    Surgical History: Past Surgical History:  Procedure Laterality Date   ABDOMINAL HYSTERECTOMY     COLONOSCOPY WITH PROPOFOL N/A 07/01/2019   Procedure: COLONOSCOPY WITH PROPOFOL;  Surgeon: Danie Binder, MD;  [facility];  Service: Endoscopy;  Laterality: N/A;  2:15pm - pt will not move up   [place], URETEROSCOPY AND STENT PLACEMENT Left 08/11/2021   Procedure: [place], URETEROSCOPY AND STENT PLACEMENT;  Surgeon: Cleon Gustin, MD;  Location: [facility];  Service: Urology;  Laterality: Left;   CYSTOSCOPY WITH RETROGRADE PYELOGRAM, URETEROSCOPY AND STENT PLACEMENT Left 09/08/2021   Procedure: CYSTOSCOPY WITH RETROGRADE PYELOGRAM, URETEROSCOPY AND STENT EXCHANGE;  Surgeon: Cleon Gustin, MD;  Location: [facility];  Service: Urology;  Laterality: Left;   EXTRACORPOREAL SHOCK WAVE LITHOTRIPSY Left  05/31/2021   Procedure: EXTRACORPOREAL SHOCK WAVE LITHOTRIPSY (ESWL);  Surgeon: Cleon Gustin, MD;  Location: [facility];  Service: Urology;  Laterality: Left;   EXTRACORPOREAL SHOCK WAVE LITHOTRIPSY Left 06/28/2021   Procedure: EXTRACORPOREAL SHOCK WAVE LITHOTRIPSY (ESWL);  Surgeon: Cleon Gustin, MD;  Location: [facility];  Service: Urology;  Laterality: Left;   HOLMIUM LASER APPLICATION Left 04/11/4578   Procedure: HOLMIUM LASER APPLICATION;  Surgeon: Cleon Gustin, MD;  Location: [facility];  Service: Urology;  Laterality: Left;   HOLMIUM LASER APPLICATION Left 12/17/8336   Procedure: HOLMIUM LASER APPLICATION;  Surgeon: Cleon Gustin, MD;  Location: [facility];  Service: Urology;  Laterality: Left;   LEFT AND RIGHT HEART CATHETERIZATION WITH CORONARY ANGIOGRAM N/A 09/03/2013   Procedure: LEFT AND RIGHT HEART CATHETERIZATION WITH CORONARY ANGIOGRAM;  Surgeon: [name], MD;  Location: [facility] CATH LAB;  Service: Cardiovascular;  Laterality: N/A;   OOPHORECTOMY     removed 1 ovary and 1 ovary remains.   POLYPECTOMY  07/01/2019   Procedure: POLYPECTOMY;  Surgeon: Danie Binder, MD;  [facility];  Service: Endoscopy;;   STONE EXTRACTION WITH BASKET Left 09/08/2021   Procedure: STONE EXTRACTION WITH BASKET;  Surgeon: Cleon Gustin, MD;  Location: [facility];  Service: Urology;  Laterality: Left;   ureteral extraction of kidney stone      Home Medications:  Allergies as of 09/14/2021       Reactions   Levaquin [levofloxacin In D5w] Diarrhea   Penicillins Rash  Medication List        Accurate as of September 14, 2021  1:23 PM. If you have any questions, ask your nurse or doctor.          acetaminophen 500 MG tablet Commonly known as: TYLENOL Take 1,000 mg by mouth every 6 (six) hours as needed for moderate pain or headache.   albuterol 108 (90 Base) MCG/ACT inhaler Commonly known as: VENTOLIN HFA Inhale 2 puffs into the lungs every 6 (six) hours as  needed for wheezing or shortness of breath.   alendronate 70 MG tablet Commonly known as: Fosamax Take 1 tablet (70 mg total) by mouth every 7 (seven) days. Take with a full glass of water on an empty stomach.   amLODipine 10 MG tablet Commonly known as: NORVASC Take 1 tablet (10 mg total) by mouth daily.   bisoprolol 10 MG tablet Commonly known as: ZEBETA Take 1 tablet (10 mg total) by mouth daily.   diclofenac 75 MG EC tablet Commonly known as: VOLTAREN Take 1 tablet (75 mg total) by mouth 2 (two) times daily.   diclofenac Sodium 1 % Gel Commonly known as: Voltaren Apply 2 g topically 4 (four) times daily.   Entresto 97-103 MG Generic drug: sacubitril-valsartan Take 1 tablet by mouth twice daily   glucose blood test strip Commonly known as: OneTouch Verio Use as instructed   HYDROcodone-acetaminophen 5-325 MG tablet Commonly known as: Norco Take 1 tablet by mouth every 6 (six) hours as needed for moderate pain.   lisinopril 10 MG tablet Commonly known as: ZESTRIL Take 10 mg by mouth daily.   OneTouch Delica Lancets Fine Misc Use to check BG once daily   oxybutynin 10 MG 24 hr tablet Commonly known as: Ditropan XL Take 1 tablet (10 mg total) by mouth daily.   oxyCODONE-acetaminophen 5-325 MG tablet Commonly known as: Percocet Take 1 tablet by mouth every 4 (four) hours as needed for severe pain.   rosuvastatin 10 MG tablet Commonly known as: Crestor Take 1 tablet (10 mg total) by mouth daily.   tamsulosin 0.4 MG Caps capsule Commonly known as: Flomax Take 1 capsule (0.4 mg total) by mouth daily after supper.   Vitamin D (Ergocalciferol) 1.25 MG (50000 UNIT) Caps capsule Commonly known as: DRISDOL Take 1 capsule (50,000 Units total) by mouth every 7 (seven) days.        Allergies:  Allergies  Allergen Reactions   Levaquin [Levofloxacin In D5w] Diarrhea   Penicillins Rash    Family History: Family History  Problem Relation Age of Onset    Hypertension Mother    Arthritis Mother    CAD Mother 21   Hip fracture Mother 38   COPD Father    Crohn's disease Sister    Cancer Sister        lung   Early death Brother 20       house fire   Diabetes Maternal Grandmother    Hypertension Sister    Hypertension Brother    Colon cancer Neg Hx     Social History:  reports that she has never smoked. She has never used smokeless tobacco. She reports current alcohol use. She reports that she does not use drugs.  ROS: All other review of systems were reviewed and are negative except what is noted above in HPI  Physical Exam: BP (!) 144/64   Pulse (!) 49   LMP 01/22/1981 (LMP Unknown)   Constitutional:  Alert and oriented, No acute distress. HEENT: [place]  AT, moist mucus membranes.  Trachea midline, no masses. Cardiovascular: No clubbing, cyanosis, or edema. Respiratory: Normal respiratory effort, no increased work of breathing. GI: Abdomen is soft, nontender, nondistended, no abdominal masses GU: No CVA tenderness.  Lymph: No cervical or inguinal lymphadenopathy. Skin: No rashes, bruises or suspicious lesions. Neurologic: Grossly intact, no focal deficits, moving all 4 extremities. Psychiatric: Normal mood and affect.  Laboratory Data: Lab Results  Component Value Date   WBC 8.2 05/05/2021   HGB 14.5 05/05/2021   HCT 43.6 05/05/2021   MCV 87 05/05/2021   PLT 268 05/05/2021    Lab Results  Component Value Date   CREATININE 0.90 08/08/2021    No results found for: PSA  No results found for: TESTOSTERONE  Lab Results  Component Value Date   HGBA1C 6.9 (H) 08/08/2021    Urinalysis    Component Value Date/Time   COLORURINE YELLOW 12/16/2013 1120   APPEARANCEUR Hazy (A) 08/29/2021 1547   LABSPEC 1.013 12/16/2013 1120   PHURINE 6.5 12/16/2013 1120   GLUCOSEU Negative 08/29/2021 1547   HGBUR MODERATE (A) 12/16/2013 1120   BILIRUBINUR Negative 08/29/2021 1547   KETONESUR NEGATIVE 12/16/2013 1120   PROTEINUR 3+  (A) 08/29/2021 1547   PROTEINUR 30 (A) 12/16/2013 1120   UROBILINOGEN negative 07/24/2014 1519   UROBILINOGEN 0.2 12/16/2013 1120   NITRITE Negative 08/29/2021 1547   NITRITE NEGATIVE 12/16/2013 1120   LEUKOCYTESUR 3+ (A) 08/29/2021 1547    Lab Results  Component Value Date   LABMICR See below: 08/29/2021   WBCUA 11-30 (A) 08/29/2021   LABEPIT 0-10 08/29/2021   MUCUS Present 08/29/2021   BACTERIA Moderate (A) 08/29/2021    Pertinent Imaging:  Results for orders placed during the hospital encounter of 07/13/21  DG Abd 1 View  Narrative CLINICAL DATA:  Post lithotripsy with continued left flank pain.  EXAM: ABDOMEN - 1 VIEW  COMPARISON:  None.  FINDINGS: The bowel gas pattern is normal.  Right renal calculi are unchanged largest measuring 2.4 cm in transverse dimension.  Numerous left renal calculi are again seen measuring up to 1 cm. The majority are unchanged and measuring up to 1 cm. Overall, slightly decreased in size and number.  Osseous structures are stable.  IMPRESSION: 1. Left renal calculi have mildly decreased in size and number. 2. Stable right renal calculi.   Electronically Signed By: Ronney Asters M.D. On: 07/14/2021 17:12  No results found for this or any previous visit.  No results found for this or any previous visit.  No results found for this or any previous visit.  No results found for this or any previous visit.  No results found for this or any previous visit.  Results for orders placed in visit on 05/18/21  CT HEMATURIA WORKUP  Narrative CLINICAL DATA:  Hematuria over the last 3 weeks. History of diabetes and hypertension. Cystitis. Urinary incontinence.  EXAM: CT ABDOMEN AND PELVIS WITHOUT AND WITH CONTRAST  TECHNIQUE: Multidetector CT imaging of the abdomen and pelvis was performed following the standard protocol before and following the bolus administration of intravenous contrast.  RADIATION DOSE REDUCTION: This  exam was performed according to the departmental dose-optimization program which includes automated exposure control, adjustment of the mA and/or kV according to patient size and/or use of iterative reconstruction technique.  CONTRAST:  167m OMNIPAQUE IOHEXOL 300 MG/ML  SOLN  COMPARISON:  CT abdomen 10/04/2001  FINDINGS: Lower chest: Mild scarring in the right middle lobe, right lower lobe, and lingula. 4 by  3 mm pleural-based nodule along the left lower lobe on image 61 series 15, not changed from 05/11/2020. Small to moderate-sized hiatal hernia. Mild cardiomegaly. Descending thoracic aortic atherosclerosis.  Hepatobiliary: Contracted gallbladder.  Otherwise unremarkable.  Pancreas: Unremarkable  Spleen: Unremarkable  Adrenals/Urinary Tract: Both adrenal glands appear normal.  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 A branching 2.2 by 1.7 cm collecting system staghorn calculus in the right renal pelvis is associated with lower pole and mid kidney caliectasis  2.5 by 1.8 cm filling defect in the right mid to lower kidney infundibulum and calyx proximal to the stone on image 56 series 16 does not appear to enhance and accordingly favors plan thrombus over tumor. Similar filling defect is observed in much of the anterior kidney collecting system as shown for example on image 37 series 13. Within the blunted calices the presumed clot as outlined by excreted contrast.  There calcifications near the distal ureters but not within the distal ureters them cells. A separate right renal calculus is nonobstructive and measures 3 mm in diameter on image 34 series 2.  Which like the contralateral side, there is a staghorn calculus in the left mid kidney collecting system abutting the UPJ, measuring 1.7 cm in long axis. Release 5 additional nonobstructive renal calculi are present in the left kidney. There is no overt left hydronephrosis nor do we demonstrate additional  casting filling defects in the left kidney. There is some mild left caliectasis particularly in upper pole example on image 32 series 13.  There is abnormal wall thickening in the right proximal ureter extending along 4.3 cm from the stone, possibly from inflammatory wall thickening, less likely to be from thrombus or tumor. Similar appearance on the left extending along 3.4 cm distal to the collecting system calculus in the left proximal ureter with only a thin channel of contrast in this vicinity.  Urinary bladder unremarkable.  Stomach/Bowel: Hiatal hernia.  Sigmoid colon diverticulosis.  Vascular/Lymphatic: Atherosclerosis is present, including aortoiliac atherosclerotic disease and atherosclerotic calcifications in branches of the abdominal aorta.  Reproductive: Uterus absent.  Adnexa unremarkable.  Other: No supplemental non-categorized findings.  Musculoskeletal: Thoracic and lumbar spondylosis and degenerative disc disease.  IMPRESSION: 1. Bilateral staghorn calculi in the collecting systems extending to the UPJ on both sides. These are associated with abnormal thickening of the proximal ureters bilaterally, as well as mild bilateral hydronephrosis. 2. In the right renal collecting system, the large staghorn calculus is associated with abnormal noncalcified filling defect in anterior infundibula and calices probably from blood clot given the fact that this abnormal density filling the infundibulum and calices is not appreciably enhancing. Difficult to exclude small amounts of tumor in this region given the substantially abnormal appearance. See for example image 37 series 13. 3. Renal atrophy and scarring with renal sinus lipomatosis. 4. Additional nonobstructive renal calculi are present bilaterally. 5. Stable 4 by 3 mm pleural-based nodule in the left lower lobe, not changed over the last year, thought to likely be benign. 6. Mild bibasilar scarring. 7. Other  imaging findings of potential clinical significance: Small to moderate-sized hiatal hernia. Mild cardiomegaly. Sigmoid colon diverticulosis. Aortic Atherosclerosis (ICD10-I70.0). Systemic atherosclerosis. Thoracolumbar spondylosis and degenerative disc disease.   Electronically Signed By: Van Clines M.D. On: 05/23/2021 14:17  No results found for this or any previous visit.   Assessment & Plan:    1. Nephrolithiasis -We discussed the management of kidney stones. These options include observation, ureteroscopy, shockwave lithotripsy (ESWL) and percutaneous nephrolithotomy (PCNL).  We discussed which options are relevant to the patient's stone(s). We discussed the natural history of kidney stones as well as the complications of untreated stones and the impact on quality of life without treatment as well as with each of the above listed treatments. We also discussed the efficacy of each treatment in its ability to clear the stone burden. With any of these management options I discussed the signs and symptoms of infection and the need for emergent treatment should these be experienced. For each option we discussed the ability of each procedure to clear the patient of their stone burden.   For observation I described the risks which include but are not limited to silent renal damage, life-threatening infection, need for emergent surgery, failure to pass stone and pain.   For ureteroscopy I described the risks which include bleeding, infection, damage to contiguous structures, positioning injury, ureteral stricture, ureteral avulsion, ureteral injury, need for prolonged ureteral stent, inability to perform ureteroscopy, need for an interval procedure, inability to clear stone burden, stent discomfort/pain, heart attack, stroke, pulmonary embolus and the inherent risks with general anesthesia.   For shockwave lithotripsy I described the risks which include arrhythmia, kidney contusion, kidney  hemorrhage, need for transfusion, pain, inability to adequately break up stone, inability to pass stone fragments, Steinstrasse, infection associated with obstructing stones, need for alternate surgical procedure, need for repeat shockwave lithotripsy, MI, CVA, PE and the inherent risks with anesthesia/conscious sedation.   For PCNL I described the risks including positioning injury, pneumothorax, hydrothorax, need for chest tube, inability to clear stone burden, renal laceration, arterial venous fistula or malformation, need for embolization of kidney, loss of kidney or renal function, need for repeat procedure, need for prolonged nephrostomy tube, ureteral avulsion, MI, CVA, PE and the inherent risks of general anesthesia.   - The patient would like to proceed with right ureteroscopic stone extraction - Urinalysis, Routine w reflex microscopic   No follow-ups on file.  Nicolette Bang, MD  [facility] [place]

## 2021-09-16 LAB — URINE CULTURE

## 2021-09-20 ENCOUNTER — Telehealth: Payer: Self-pay

## 2021-09-20 MED ORDER — SULFAMETHOXAZOLE-TRIMETHOPRIM 800-160 MG PO TABS: 1.0000 | ORAL_TABLET | Freq: Two times a day (BID) | ORAL | 0 refills | Status: DC

## 2021-09-20 NOTE — Telephone Encounter (Signed)
-----   Message from Cleon Gustin, MD sent at 09/20/2021  9:12 AM EDT ----- Bactrim DS BID for 7 days ----- Message ----- From: Audie Box, CMA Sent: 09/16/2021   2:28 PM EDT To: Cleon Gustin, MD  Please review.

## 2021-09-20 NOTE — Telephone Encounter (Signed)
Patient aware and rx sent to pharmacy.  

## 2021-09-21 NOTE — Chronic Care Management (AMB) (Signed)
  Care Coordination Note  09/21/2021 Name: [name] MRN: [id number] DOB: [birth year]  [name] is a 76 y.o. year old female who is a primary care patient of Sharion Balloon, FNP and is actively engaged with the care management team. I reached out to Christella Noa by phone today to assist with re-scheduling an initial visit with the Pharmacist  Follow up plan: Unsuccessful telephone outreach attempt made. A HIPAA compliant phone message was left for the patient providing contact information and requesting a return call.  The care management team will reach out to the patient again over the next 7 days.  If patient returns call to provider office, please advise to call [facility]  at [place], [place], [place], [place] [address] Direct Dial: [phone] [email or web address]

## 2021-09-23 NOTE — Chronic Care Management (AMB) (Signed)
  Care Coordination Note  09/23/2021 Name: [name] MRN: [id number] DOB: 11-26-1945  [name] is a 76 y.o. year old female who is a primary care patient of Sharion Balloon, FNP and is actively engaged with the care management team. I reached out to Christella Noa by phone today to assist with re-scheduling a follow up visit with the Pharmacist  Follow up plan: Unable to make contact on outreach attempts x 3. PCP Sharion Balloon, FNP notified via routed documentation in medical record.   Noreene Larsson, [place], [place], [place] [address] Direct Dial: [phone] [email or web address]

## 2021-09-28 ENCOUNTER — Encounter: Payer: Self-pay | Admitting: Family Medicine

## 2021-09-28 ENCOUNTER — Ambulatory Visit (INDEPENDENT_AMBULATORY_CARE_PROVIDER_SITE_OTHER): Payer: Medicare HMO | Admitting: Family Medicine

## 2021-09-28 VITALS — BP 177/63 | HR 50 | Temp 96.9°F | Ht 64.0 in | Wt 159.8 lb

## 2021-09-28 DIAGNOSIS — H9192 Unspecified hearing loss, left ear: Secondary | ICD-10-CM

## 2021-09-28 NOTE — Progress Notes (Signed)
Assessment & Plan:  1. Hearing loss of left ear, unspecified hearing loss type Education provided on hearing loss. Referring to audiology.  - Ambulatory referral to Audiology   Follow up plan: Return if symptoms worsen or fail to improve.  Hendricks Limes, MSN, APRN, FNP-C [facility]  Subjective:   Patient ID: [name], female    DOB: [birth year], 76 y.o.   MRN: [id number]  HPI: [name] is a 76 y.o. female presenting on 09/28/2021 for Hearing Loss (Left x 1 week)  Patient reports hearing loss in the left ear x1 week. She feels this has been going on longer, but she noticed it more in the past week. Denies any pain.   ROS: Negative unless specifically indicated above in HPI.   Relevant past medical history reviewed and updated as indicated.   Allergies and medications reviewed and updated.   Current Outpatient Medications:    acetaminophen (TYLENOL) 500 MG tablet, Take 1,000 mg by mouth every 6 (six) hours as needed for moderate pain or headache., Disp: , Rfl:    albuterol (VENTOLIN HFA) 108 (90 Base) MCG/ACT inhaler, Inhale 2 puffs into the lungs every 6 (six) hours as needed for wheezing or shortness of breath., Disp: 18 g, Rfl: 0   alendronate (FOSAMAX) 70 MG tablet, Take 1 tablet (70 mg total) by mouth every 7 (seven) days. Take with a full glass of water on an empty stomach., Disp: 4 tablet, Rfl: 11   amLODipine (NORVASC) 10 MG tablet, Take 1 tablet (10 mg total) by mouth daily., Disp: 90 tablet, Rfl: 2   bisoprolol (ZEBETA) 10 MG tablet, Take 1 tablet (10 mg total) by mouth daily., Disp: 90 tablet, Rfl: 1   diclofenac (VOLTAREN) 75 MG EC tablet, Take 1 tablet (75 mg total) by mouth 2 (two) times daily., Disp: 30 tablet, Rfl: 0   diclofenac Sodium (VOLTAREN) 1 % GEL, Apply 2 g topically 4 (four) times daily., Disp: 150 g, Rfl: 1   glucose blood (ONETOUCH VERIO) test strip, Use as instructed, Disp: 100 each, Rfl: 12    HYDROcodone-acetaminophen (NORCO) 5-325 MG tablet, Take 1 tablet by mouth every 6 (six) hours as needed for moderate pain., Disp: 30 tablet, Rfl: 0   lisinopril (ZESTRIL) 10 MG tablet, Take 10 mg by mouth daily., Disp: , Rfl:    ONETOUCH DELICA LANCETS FINE MISC, Use to check BG once daily, Disp: 100 each, Rfl: 2   oxybutynin (DITROPAN XL) 10 MG 24 hr tablet, Take 1 tablet (10 mg total) by mouth daily., Disp: 90 tablet, Rfl: 1   oxyCODONE-acetaminophen (PERCOCET) 5-325 MG tablet, Take 1 tablet by mouth every 4 (four) hours as needed for severe pain., Disp: 30 tablet, Rfl: 0   rosuvastatin (CRESTOR) 10 MG tablet, Take 1 tablet (10 mg total) by mouth daily., Disp: 90 tablet, Rfl: 3   sacubitril-valsartan (ENTRESTO) 97-103 MG, Take 1 tablet by mouth twice daily, Disp: 90 tablet, Rfl: 3   sulfamethoxazole-trimethoprim (BACTRIM DS) 800-160 MG tablet, Take 1 tablet by mouth 2 (two) times daily., Disp: 14 tablet, Rfl: 0   tamsulosin (FLOMAX) 0.4 MG CAPS capsule, Take 1 capsule (0.4 mg total) by mouth daily after supper., Disp: 30 capsule, Rfl: 1   Vitamin D, Ergocalciferol, (DRISDOL) 1.25 MG (50000 UNIT) CAPS capsule, Take 1 capsule (50,000 Units total) by mouth every 7 (seven) days., Disp: 12 capsule, Rfl: 3  Allergies  Allergen Reactions   Levaquin [Levofloxacin In D5w] Diarrhea   Penicillins Rash  Objective:   BP (!) 177/63   Pulse (!) 50   Temp (!) 96.9 F (36.1 C) (Temporal)   Ht '5\' 4"'$  (1.626 m)   Wt 159 lb 12.8 oz (72.5 kg)   LMP 01/22/1981 (LMP Unknown)   SpO2 94%   BMI 27.43 kg/m    Physical Exam Vitals reviewed.  Constitutional:      General: She is not in acute distress.    Appearance: Normal appearance. She is not ill-appearing, toxic-appearing or diaphoretic.  HENT:     Head: Normocephalic and atraumatic.     Right Ear: Tympanic membrane, ear canal and external ear normal. There is no impacted cerumen.     Left Ear: Tympanic membrane, ear canal and external ear normal.  There is no impacted cerumen.  Eyes:     General: No scleral icterus.       Right eye: No discharge.        Left eye: No discharge.     Conjunctiva/sclera: Conjunctivae normal.  Cardiovascular:     Rate and Rhythm: Normal rate.  Pulmonary:     Effort: Pulmonary effort is normal. No respiratory distress.  Musculoskeletal:        General: Normal range of motion.     Cervical back: Normal range of motion.  Skin:    General: Skin is warm and dry.     Capillary Refill: Capillary refill takes less than 2 seconds.  Neurological:     General: No focal deficit present.     Mental Status: She is alert and oriented to person, place, and time. Mental status is at baseline.  Psychiatric:        Mood and Affect: Mood normal.        Behavior: Behavior normal.        Thought Content: Thought content normal.        Judgment: Judgment normal.

## 2021-10-04 ENCOUNTER — Encounter (HOSPITAL_COMMUNITY): Payer: Self-pay

## 2021-10-04 ENCOUNTER — Encounter (HOSPITAL_COMMUNITY)
Admission: RE | Admit: 2021-10-04 | Discharge: 2021-10-04 | Disposition: A | Discharge status: Home / Self Care | Payer: Medicare HMO | Source: Ambulatory Visit | Attending: Urology | Admitting: Urology

## 2021-10-06 ENCOUNTER — Ambulatory Visit (HOSPITAL_COMMUNITY)
Admission: RE | Admit: 2021-10-06 | Discharge: 2021-10-06 | Disposition: A | Discharge status: Home / Self Care | Payer: Medicare HMO | Attending: Urology | Admitting: Urology

## 2021-10-06 ENCOUNTER — Ambulatory Visit (HOSPITAL_COMMUNITY): Payer: Medicare HMO

## 2021-10-06 ENCOUNTER — Ambulatory Visit (HOSPITAL_BASED_OUTPATIENT_CLINIC_OR_DEPARTMENT_OTHER): Payer: Medicare HMO

## 2021-10-06 ENCOUNTER — Encounter (HOSPITAL_COMMUNITY)
Admission: RE | Disposition: A | Discharge status: Home / Self Care | Payer: Self-pay | Source: Home / Self Care | Attending: Urology

## 2021-10-06 ENCOUNTER — Encounter (HOSPITAL_COMMUNITY): Payer: Self-pay | Admitting: Urology

## 2021-10-06 DIAGNOSIS — N1330 Unspecified hydronephrosis: Secondary | ICD-10-CM | POA: Diagnosis not present

## 2021-10-06 DIAGNOSIS — N200 Calculus of kidney: Secondary | ICD-10-CM

## 2021-10-06 DIAGNOSIS — E119 Type 2 diabetes mellitus without complications: Secondary | ICD-10-CM | POA: Diagnosis not present

## 2021-10-06 DIAGNOSIS — G4730 Sleep apnea, unspecified: Secondary | ICD-10-CM | POA: Insufficient documentation

## 2021-10-06 DIAGNOSIS — Z8673 Personal history of transient ischemic attack (TIA), and cerebral infarction without residual deficits: Secondary | ICD-10-CM | POA: Diagnosis not present

## 2021-10-06 DIAGNOSIS — I10 Essential (primary) hypertension: Secondary | ICD-10-CM | POA: Insufficient documentation

## 2021-10-06 DIAGNOSIS — K219 Gastro-esophageal reflux disease without esophagitis: Secondary | ICD-10-CM | POA: Diagnosis not present

## 2021-10-06 DIAGNOSIS — J45909 Unspecified asthma, uncomplicated: Secondary | ICD-10-CM | POA: Insufficient documentation

## 2021-10-06 DIAGNOSIS — N132 Hydronephrosis with renal and ureteral calculous obstruction: Secondary | ICD-10-CM | POA: Diagnosis not present

## 2021-10-06 HISTORY — PX: HOLMIUM LASER APPLICATION: SHX5852

## 2021-10-06 HISTORY — PX: CYSTOSCOPY WITH RETROGRADE PYELOGRAM, URETEROSCOPY AND STENT PLACEMENT: SHX5789

## 2021-10-06 LAB — GLUCOSE, CAPILLARY
Glucose-Capillary: 140 mg/dL — ABNORMAL HIGH (ref 70–99)
Glucose-Capillary: 135 mg/dL — ABNORMAL HIGH (ref 70–99)

## 2021-10-06 SURGERY — CYSTOURETEROSCOPY, WITH RETROGRADE PYELOGRAM AND STENT INSERTION
Anesthesia: General | Site: Ureter | Laterality: Right

## 2021-10-06 MED ORDER — FENTANYL CITRATE (PF) 100 MCG/2ML IJ SOLN
INTRAMUSCULAR | Status: DC | PRN
  Administered 2021-10-06: 25 ug via INTRAVENOUS

## 2021-10-06 MED ORDER — LACTATED RINGERS IV SOLN: INTRAVENOUS | Status: DC | PRN

## 2021-10-06 MED ORDER — PROPOFOL 10 MG/ML IV BOLUS
INTRAVENOUS | Status: DC | PRN
  Administered 2021-10-06: 150 mg via INTRAVENOUS

## 2021-10-06 MED ORDER — CEFAZOLIN SODIUM-DEXTROSE 2-4 GM/100ML-% IV SOLN
2.0000 g | INTRAVENOUS | Status: AC
  Administered 2021-10-06: 2 g via INTRAVENOUS

## 2021-10-06 MED ORDER — OXYCODONE-ACETAMINOPHEN 5-325 MG PO TABS
ORAL_TABLET | ORAL | Status: AC
  Filled 2021-10-06: qty 1

## 2021-10-06 MED ORDER — PROPOFOL 10 MG/ML IV BOLUS
INTRAVENOUS | Status: AC
  Filled 2021-10-06: qty 20

## 2021-10-06 MED ORDER — OXYCODONE-ACETAMINOPHEN 5-325 MG PO TABS
1.0000 | ORAL_TABLET | Freq: Once | ORAL | Status: AC
  Administered 2021-10-06: 1 via ORAL

## 2021-10-06 MED ORDER — DIATRIZOATE MEGLUMINE 30 % UR SOLN
URETHRAL | Status: AC
  Filled 2021-10-06: qty 100

## 2021-10-06 MED ORDER — KETOROLAC TROMETHAMINE 30 MG/ML IJ SOLN
INTRAMUSCULAR | Status: DC | PRN
  Administered 2021-10-06: 15 mg via INTRAVENOUS

## 2021-10-06 MED ORDER — OXYCODONE-ACETAMINOPHEN 5-325 MG PO TABS: 1.0000 | ORAL_TABLET | ORAL | 0 refills | Status: DC | PRN

## 2021-10-06 MED ORDER — SODIUM CHLORIDE 0.9 % IR SOLN
Status: DC | PRN
  Administered 2021-10-06 (×2): 3000 mL

## 2021-10-06 MED ORDER — ONDANSETRON HCL 4 MG/2ML IJ SOLN
INTRAMUSCULAR | Status: DC | PRN
  Administered 2021-10-06: 4 mg via INTRAVENOUS

## 2021-10-06 MED ORDER — CEFAZOLIN SODIUM-DEXTROSE 2-4 GM/100ML-% IV SOLN
INTRAVENOUS | Status: AC
  Filled 2021-10-06: qty 100

## 2021-10-06 MED ORDER — LIDOCAINE HCL (CARDIAC) PF 100 MG/5ML IV SOSY
PREFILLED_SYRINGE | INTRAVENOUS | Status: DC | PRN
  Administered 2021-10-06: 100 mg via INTRAVENOUS

## 2021-10-06 MED ORDER — KETOROLAC TROMETHAMINE 30 MG/ML IJ SOLN
INTRAMUSCULAR | Status: AC
  Filled 2021-10-06: qty 1

## 2021-10-06 MED ORDER — WATER FOR IRRIGATION, STERILE IR SOLN
Status: DC | PRN
  Administered 2021-10-06: 500 mL

## 2021-10-06 MED ORDER — MIDAZOLAM HCL 2 MG/2ML IJ SOLN
INTRAMUSCULAR | Status: AC
  Filled 2021-10-06: qty 2

## 2021-10-06 MED ORDER — FENTANYL CITRATE PF 50 MCG/ML IJ SOSY: 25.0000 ug | PREFILLED_SYRINGE | INTRAMUSCULAR | Status: DC | PRN

## 2021-10-06 MED ORDER — DEXAMETHASONE SODIUM PHOSPHATE 4 MG/ML IJ SOLN
INTRAMUSCULAR | Status: DC | PRN
  Administered 2021-10-06: 5 mg via INTRAVENOUS

## 2021-10-06 MED ORDER — DIATRIZOATE MEGLUMINE 30 % UR SOLN
URETHRAL | Status: DC | PRN
  Administered 2021-10-06: 10 mL via URETHRAL

## 2021-10-06 MED ORDER — CEFUROXIME AXETIL 500 MG PO TABS: 500.0000 mg | ORAL_TABLET | Freq: Two times a day (BID) | ORAL | 0 refills | Status: DC

## 2021-10-06 MED ORDER — FENTANYL CITRATE (PF) 100 MCG/2ML IJ SOLN
INTRAMUSCULAR | Status: AC
  Filled 2021-10-06: qty 2

## 2021-10-06 MED ORDER — TAMSULOSIN HCL 0.4 MG PO CAPS: 0.4000 mg | ORAL_CAPSULE | Freq: Every day | ORAL | 1 refills | Status: DC

## 2021-10-06 SURGICAL SUPPLY — 26 items
BAG DRAIN URO TABLE W/ADPT NS (BAG) ×2 IMPLANT
BAG DRN 8 ADPR NS SKTRN CSTL (BAG) ×1
BAG HAMPER (MISCELLANEOUS) ×2 IMPLANT
CATH INTERMIT  6FR 70CM (CATHETERS) ×2 IMPLANT
CATH ROBINSON RED A/P 12FR (CATHETERS) ×1 IMPLANT
CLOTH BEACON ORANGE TIMEOUT ST (SAFETY) ×2 IMPLANT
GLOVE BIO SURGEON STRL SZ8 (GLOVE) ×2 IMPLANT
GLOVE BIOGEL PI IND STRL 7.0 (GLOVE) ×2 IMPLANT
GLOVE BIOGEL PI INDICATOR 7.0 (GLOVE) ×2
GOWN STRL REUS W/TWL LRG LVL3 (GOWN DISPOSABLE) ×2 IMPLANT
GOWN STRL REUS W/TWL XL LVL3 (GOWN DISPOSABLE) ×2 IMPLANT
GUIDEWIRE STR DUAL SENSOR (WIRE) ×2 IMPLANT
GUIDEWIRE STR ZIPWIRE 035X150 (MISCELLANEOUS) ×2 IMPLANT
IV NS IRRIG 3000ML ARTHROMATIC (IV SOLUTION) ×4 IMPLANT
KIT TURNOVER CYSTO (KITS) ×2 IMPLANT
MANIFOLD NEPTUNE II (INSTRUMENTS) ×2 IMPLANT
PACK CYSTO (CUSTOM PROCEDURE TRAY) ×2 IMPLANT
PAD ARMBOARD 7.5X6 YLW CONV (MISCELLANEOUS) ×2 IMPLANT
SHEATH URETERAL 12FRX35CM (MISCELLANEOUS) ×1 IMPLANT
STENT URET 6FRX24 CONTOUR (STENTS) ×1 IMPLANT
SYR 10ML LL (SYRINGE) ×3 IMPLANT
SYR CONTROL 10ML LL (SYRINGE) ×2 IMPLANT
TOWEL OR 17X26 4PK STRL BLUE (TOWEL DISPOSABLE) ×2 IMPLANT
TRACTIP FLEXIVA PULS ID 200XHI (Laser) IMPLANT
TRACTIP FLEXIVA PULSE ID 200 (Laser) ×2
WATER STERILE IRR 500ML POUR (IV SOLUTION) ×2 IMPLANT

## 2021-10-06 NOTE — Transfer of Care (Signed)
Immediate Anesthesia Transfer of Care Note  Patient: [name]  Procedure(s) Performed: CYSTOSCOPY WITH RETROGRADE PYELOGRAM, URETEROSCOPY AND STENT PLACEMENT (Right: Ureter) HOLMIUM LASER APPLICATION (Right: Ureter)  Patient Location: PACU  Anesthesia Type:General  Level of Consciousness: awake, alert  and oriented  Airway & Oxygen Therapy: Patient Spontanous Breathing  Post-op Assessment: Report given to RN, Post -op Vital signs reviewed and stable and Patient moving all extremities X 4  Post vital signs: Reviewed and stable  Last Vitals:  Vitals Value Taken Time  BP    Temp    Pulse 58 10/06/21 1108  Resp 11 10/06/21 1108  SpO2 97 % 10/06/21 1108  Vitals shown include unvalidated device data.  Last Pain:  Vitals:   10/06/21 0834  TempSrc: Oral  PainSc:          Complications: No notable events documented.

## 2021-10-06 NOTE — Anesthesia Procedure Notes (Addendum)
Procedure Name: LMA Insertion Date/Time: 10/06/2021 9:46 AM  Performed by: Louann Sjogren, MDPre-anesthesia Checklist: Patient identified, Emergency Drugs available, Suction available, Patient being monitored and Timeout performed Patient Re-evaluated:Patient Re-evaluated prior to induction Oxygen Delivery Method: Circle system utilized Preoxygenation: Pre-oxygenation with 100% oxygen Induction Type: IV induction Ventilation: Mask ventilation without difficulty LMA: LMA inserted LMA Size: 4.0

## 2021-10-06 NOTE — Interval H&P Note (Signed)
History and Physical Interval Note:  10/06/2021 8:26 AM  [name]  has presented today for surgery, with the diagnosis of right renal calculi.  The various methods of treatment have been discussed with the patient and family. After consideration of risks, benefits and other options for treatment, the patient has consented to  Procedure(s): CYSTOSCOPY WITH RETROGRADE PYELOGRAM, URETEROSCOPY AND STENT PLACEMENT (Right) HOLMIUM LASER APPLICATION (Right) as a surgical intervention.  The patient's history has been reviewed, patient examined, no change in status, stable for surgery.  I have reviewed the patient's chart and labs.  Questions were answered to the patient's satisfaction.     Nicolette Bang

## 2021-10-06 NOTE — Anesthesia Preprocedure Evaluation (Signed)
Anesthesia Evaluation  Patient identified by MRN, date of birth, ID band Patient awake    Reviewed: Allergy & Precautions, H&P , NPO status , Patient's Chart, lab work & pertinent test results, reviewed documented beta blocker date and time   Airway Mallampati: II  TM Distance: >3 FB Neck ROM: full    Dental no notable dental hx.    Pulmonary asthma , sleep apnea ,    Pulmonary exam normal breath sounds clear to auscultation       Cardiovascular Exercise Tolerance: Good hypertension, negative cardio ROS   Rhythm:regular Rate:Normal     Neuro/Psych  Headaches,  Neuromuscular disease CVA negative psych ROS   GI/Hepatic Neg liver ROS, GERD  ,  Endo/Other  negative endocrine ROSdiabetes, Type 2  Renal/GU negative Renal ROS  negative genitourinary   Musculoskeletal   Abdominal   Peds  Hematology negative hematology ROS (+)   Anesthesia Other Findings 1. Left ventricular ejection fraction, by visual estimation, is 60 to  65%. The left ventricle has normal function. There is mildly increased  left ventricular hypertrophy.  2. Elevated left ventricular end-diastolic pressure.  3. Left ventricular diastolic parameters are consistent with Grade I  diastolic dysfunction (impaired relaxation).  4. The left ventricle has no regional wall motion abnormalities.  5. Global right ventricle has normal systolic function.The right  ventricular size is normal. No increase in right ventricular wall  thickness.  6. Left atrial size was normal.  7. Right atrial size was normal.  8. The mitral valve is grossly normal. Mild mitral valve regurgitation.  9. The tricuspid valve is grossly normal. Tricuspid valve regurgitation  is trivial.  10. The aortic valve is tricuspid. Aortic valve regurgitation is not  visualized.  11. The pulmonic valve was grossly normal. Pulmonic valve regurgitation is  not visualized.  12. The  inferior vena cava is normal in size with greater than 50%  respiratory variability, suggesting right atrial pressure of 3 mmHg.   Reproductive/Obstetrics negative OB ROS                             Anesthesia Physical  Anesthesia Plan  ASA: 3  Anesthesia Plan: General and General LMA   Post-op Pain Management:    Induction:   PONV Risk Score and Plan: Ondansetron  Airway Management Planned:   Additional Equipment:   Intra-op Plan:   Post-operative Plan:   Informed Consent: I have reviewed the patients History and Physical, chart, labs and discussed the procedure including the risks, benefits and alternatives for the proposed anesthesia with the patient or authorized representative who has indicated his/her understanding and acceptance.     Dental Advisory Given  Plan Discussed with: CRNA  Anesthesia Plan Comments:         Anesthesia Quick Evaluation

## 2021-10-06 NOTE — Op Note (Signed)
Preoperative diagnosis: Right renal stone  Postoperative diagnosis: Same  Procedure: 1 cystoscopy 2. Right retrograde pyelography 3.  Intraoperative fluoroscopy, under one hour, with interpretation 4.  Right ureteroscopic stone manipulation with laser lithotripsy 5.  right 6 x 24 JJ stent placement  Attending: Rosie Fate  Anesthesia: General  Estimated blood loss: None  Drains: Right 6 x 24 JJ ureteral stent with tether  Specimens: urine culture  Antibiotics: ancef  Findings: Right mid pole partial staghorn stone. Severe mid pole hydronephrosis. No masses/lesions in the bladder. Ureteral orifices in normal anatomic location.  Indications: Patient is a 76 year old female with a history of right partial staghorn renal stone.  After discussing treatment options, she decided proceed with right ureteroscopic stone manipulation.  Procedure in detail: The patient was brought to the operating room and a brief timeout was done to ensure correct patient, correct procedure, correct site.  General anesthesia was administered patient was placed in dorsal lithotomy position.  Her genitalia was then prepped and draped in usual sterile fashion.  A rigid 65 French cystoscope was passed in the urethra and the bladder.  Bladder was inspected free masses or lesions.  the ureteral orifices were in the normal orthotopic locations.  a 6 french ureteral catheter was then instilled into the right ureteral orifice.  a gentle retrograde was obtained and findings noted above.  we then placed a zip wire through the ureteral catheter and advanced up to the renal pelvis.  we then removed the cystoscope and cannulated the right ureteral orifice with a semirigid ureteroscope.  No stone was found in the ureter. Once we reached the UPJ a sensor wire was advanced in to the renal pelvis. We the advanced a flexible ureteroscope over the sensor wire and up to the renal pelvis. The wire was then removed. We then used the  flexible ureteroscope to perform nephroscopy. We encountered the stone in the mid pole. Using a 242nm laser fiber the stone was dusted. Once we unobstructed the mid pole calyx purulent urine drained and we elected to obtain a urine culture and place a stent.   We then placed a 6 x 24 double-j ureteral stent over the original zip wire.  We then removed the wire and good coil was noted in the the renal pelvis under fluoroscopy and the bladder under direct vision. the bladder was then drained and this concluded the procedure which was well tolerated by patient.  Complications: None  Condition: Stable, extubated, transferred to PACU  Plan: Patient is to be discharged home as to follow-up in 2 weeks for repeat ureteroscopic stone extraction

## 2021-10-07 LAB — URINE CULTURE: Culture: NO GROWTH

## 2021-10-08 NOTE — Anesthesia Postprocedure Evaluation (Signed)
Anesthesia Post Note  Patient: SAHER DAVEE  Procedure(s) Performed: CYSTOSCOPY WITH RETROGRADE PYELOGRAM, URETEROSCOPY AND STENT PLACEMENT (Right: Ureter) HOLMIUM LASER APPLICATION (Right: Ureter)  Patient location during evaluation: Phase II Anesthesia Type: General Level of consciousness: awake Pain management: pain level controlled Vital Signs Assessment: post-procedure vital signs reviewed and stable Respiratory status: spontaneous breathing and respiratory function stable Cardiovascular status: blood pressure returned to baseline and stable Postop Assessment: no headache and no apparent nausea or vomiting Anesthetic complications: no Comments: Late entry   No notable events documented.   Last Vitals:  Vitals:   10/06/21 1130 10/06/21 1146  BP: 130/60 (!) 150/75  Pulse: 61 75  Resp: 16 18  Temp:  36.6 C  SpO2: 96% 98%    Last Pain:  Vitals:   10/06/21 1146  TempSrc: Oral  PainSc: [place]

## 2021-10-10 ENCOUNTER — Encounter (HOSPITAL_COMMUNITY): Payer: Self-pay | Admitting: Urology

## 2021-10-12 ENCOUNTER — Ambulatory Visit (INDEPENDENT_AMBULATORY_CARE_PROVIDER_SITE_OTHER): Payer: Medicare HMO | Admitting: Urology

## 2021-10-12 VITALS — BP 158/81 | HR 103

## 2021-10-12 DIAGNOSIS — N3000 Acute cystitis without hematuria: Secondary | ICD-10-CM

## 2021-10-12 DIAGNOSIS — N200 Calculus of kidney: Secondary | ICD-10-CM

## 2021-10-12 LAB — URINALYSIS, ROUTINE W REFLEX MICROSCOPIC
Bilirubin, UA: NEGATIVE
Glucose, UA: NEGATIVE
Nitrite, UA: NEGATIVE
Specific Gravity, UA: 1.02 (ref 1.005–1.030)
Urobilinogen, Ur: 0.2 mg/dL (ref 0.2–1.0)
pH, UA: 5.5 (ref 5.0–7.5)

## 2021-10-12 MED ORDER — GEMTESA 75 MG PO TABS: 1.0000 | ORAL_TABLET | Freq: Every day | ORAL | 0 refills | Status: DC

## 2021-10-12 MED ORDER — OXYCODONE-ACETAMINOPHEN 5-325 MG PO TABS: 1.0000 | ORAL_TABLET | ORAL | 0 refills | Status: DC | PRN

## 2021-10-12 NOTE — Progress Notes (Signed)
10/12/2021 1:45 PM   [name] [birth year] [id number]  Referring provider: Sharion Newton, [name],  [name] [address]  Right flank pain   HPI: [name] is a 76yo here for followup with right flank pain.  She underwent right ureteroscopy 1 week ago and presents today with a 2 day history of worsening right flank pain and urinary frequency. She currently has a right ureteral stent in place. She is having intermittent gross hematuria. No fevers. No other LUTS.    PMH: Past Medical History:  Diagnosis Date   Asthma    Chronic systolic heart failure ([place])    Echo (08/26/13):  Mild LVH. EF 25% to 30%. Diffuse HK. Aortic valve: There was trivial regurgitation. Mitral valve: There was mild regurgitation. Left atrium: The atrium was mildly dilated.   GERD (gastroesophageal reflux disease)    Headache(784.0)    History of nephrolithiasis    Hypertension    NICM (nonischemic cardiomyopathy) ([place])    a. LHC (08/2013):  no CAD   Sleep apnea    does not use machine   Stroke ([facility])    Type II or unspecified type diabetes mellitus without mention of complication, uncontrolled    borderline    Surgical History: Past Surgical History:  Procedure Laterality Date   ABDOMINAL HYSTERECTOMY     COLONOSCOPY WITH PROPOFOL N/A 07/01/2019   Procedure: COLONOSCOPY WITH PROPOFOL;  Surgeon: Danie Binder, MD;  [facility];  Service: Endoscopy;  Laterality: N/A;  2:15pm - pt will not move up   [place], URETEROSCOPY AND STENT PLACEMENT Left 08/11/2021   Procedure: [place], URETEROSCOPY AND STENT PLACEMENT;  Surgeon: Cleon Gustin, MD;  Location: [facility];  Service: Urology;  Laterality: Left;   CYSTOSCOPY WITH RETROGRADE PYELOGRAM, URETEROSCOPY AND STENT PLACEMENT Left 09/08/2021   Procedure: CYSTOSCOPY WITH RETROGRADE PYELOGRAM, URETEROSCOPY AND STENT EXCHANGE;  Surgeon: Cleon Gustin, MD;   Location: [facility];  Service: Urology;  Laterality: Left;   CYSTOSCOPY WITH RETROGRADE PYELOGRAM, URETEROSCOPY AND STENT PLACEMENT Right 10/06/2021   Procedure: CYSTOSCOPY WITH RETROGRADE PYELOGRAM, URETEROSCOPY AND STENT PLACEMENT;  Surgeon: Cleon Gustin, MD;  Location: [facility];  Service: Urology;  Laterality: Right;   EXTRACORPOREAL SHOCK WAVE LITHOTRIPSY Left 05/31/2021   Procedure: EXTRACORPOREAL SHOCK WAVE LITHOTRIPSY (ESWL);  Surgeon: Cleon Gustin, MD;  Location: [facility];  Service: Urology;  Laterality: Left;   EXTRACORPOREAL SHOCK WAVE LITHOTRIPSY Left 06/28/2021   Procedure: EXTRACORPOREAL SHOCK WAVE LITHOTRIPSY (ESWL);  Surgeon: Cleon Gustin, MD;  Location: [facility];  Service: Urology;  Laterality: Left;   HOLMIUM LASER APPLICATION Left 05/17/621   Procedure: HOLMIUM LASER APPLICATION;  Surgeon: Cleon Gustin, MD;  Location: [facility];  Service: Urology;  Laterality: Left;   HOLMIUM LASER APPLICATION Left 10/13/2829   Procedure: HOLMIUM LASER APPLICATION;  Surgeon: Cleon Gustin, MD;  Location: [facility];  Service: Urology;  Laterality: Left;   HOLMIUM LASER APPLICATION Right 08/25/6158   Procedure: HOLMIUM LASER APPLICATION;  Surgeon: Cleon Gustin, MD;  [facility];  Service: Urology;  Laterality: Right;   LEFT AND RIGHT HEART CATHETERIZATION WITH CORONARY ANGIOGRAM N/A 09/03/2013   Procedure: LEFT AND RIGHT HEART CATHETERIZATION WITH CORONARY ANGIOGRAM;  Surgeon: [name], MD;  Location[facility] C[facility];  Service: Cardiovascular;  Laterality: N/A;   OOPHORECTOMY     removed 1 ovary and 1 ovary remains.   POLYPECTOMY  07/01/2019   Procedure:  POLYPECTOMY;  Surgeon: Danie Binder, MD;  [facility];  Service: Endoscopy;;   STONE EXTRACTION WITH BASKET Left 09/08/2021   Procedure: STONE EXTRACTION WITH BASKET;  Surgeon: Cleon Gustin, MD;  Location: [facility];  Service: Urology;  Laterality: Left;   ureteral extraction of kidney stone       Home Medications:  Allergies as of 10/12/2021       Reactions   Levaquin [levofloxacin In D5w] Diarrhea   Penicillins Rash        Medication List        Accurate as of October 12, 2021  1:45 PM. If you have any questions, ask your nurse or doctor.          acetaminophen 500 MG tablet Commonly known as: TYLENOL Take 1,000 mg by mouth every 6 (six) hours as needed for moderate pain or headache.   albuterol 108 (90 Base) MCG/ACT inhaler Commonly known as: VENTOLIN HFA Inhale 2 puffs into the lungs every 6 (six) hours as needed for wheezing or shortness of breath.   alendronate 70 MG tablet Commonly known as: Fosamax Take 1 tablet (70 mg total) by mouth every 7 (seven) days. Take with a full glass of water on an empty stomach.   amLODipine 10 MG tablet Commonly known as: NORVASC Take 1 tablet (10 mg total) by mouth daily.   bisoprolol 10 MG tablet Commonly known as: ZEBETA Take 1 tablet (10 mg total) by mouth daily.   cefUROXime 500 MG tablet Commonly known as: CEFTIN Take 1 tablet (500 mg total) by mouth 2 (two) times daily with a meal.   diclofenac 75 MG EC tablet Commonly known as: VOLTAREN Take 1 tablet (75 mg total) by mouth 2 (two) times daily.   diclofenac Sodium 1 % Gel Commonly known as: Voltaren Apply 2 g topically 4 (four) times daily.   Entresto 97-103 MG Generic drug: sacubitril-valsartan Take 1 tablet by mouth twice daily   glucose blood test strip Commonly known as: OneTouch Verio Use as instructed   HYDROcodone-acetaminophen 5-325 MG tablet Commonly known as: Norco Take 1 tablet by mouth every 6 (six) hours as needed for moderate pain.   lisinopril 10 MG tablet Commonly known as: ZESTRIL Take 10 mg by mouth daily.   OneTouch Delica Lancets Fine Misc Use to check BG once daily   oxybutynin 10 MG 24 hr tablet Commonly known as: Ditropan XL Take 1 tablet (10 mg total) by mouth daily.   oxyCODONE-acetaminophen 5-325 MG  tablet Commonly known as: Percocet Take 1 tablet by mouth every 4 (four) hours as needed for severe pain.   rosuvastatin 10 MG tablet Commonly known as: Crestor Take 1 tablet (10 mg total) by mouth daily.   sulfamethoxazole-trimethoprim 800-160 MG tablet Commonly known as: BACTRIM DS Take 1 tablet by mouth 2 (two) times daily.   tamsulosin 0.4 MG Caps capsule Commonly known as: Flomax Take 1 capsule (0.4 mg total) by mouth daily after supper.   Vitamin D (Ergocalciferol) 1.25 MG (50000 UNIT) Caps capsule Commonly known as: DRISDOL Take 1 capsule (50,000 Units total) by mouth every 7 (seven) days.        Allergies:  Allergies  Allergen Reactions   Levaquin [Levofloxacin In D5w] Diarrhea   Penicillins Rash    Family History: Family History  Problem Relation Age of Onset   Hypertension Mother    Arthritis Mother    CAD Mother 12   Hip fracture Mother 44   COPD Father  Crohn's disease Sister    Cancer Sister        lung   Early death Brother 81       house fire   Diabetes Maternal Grandmother    Hypertension Sister    Hypertension Brother    Colon cancer Neg Hx     Social History:  reports that she has never smoked. She has never used smokeless tobacco. She reports current alcohol use. She reports that she does not use drugs.  ROS: All other review of systems were reviewed and are negative except what is noted above in HPI  Physical Exam: BP (!) 158/81   Pulse (!) 103   LMP 01/22/1981 (LMP Unknown)   Constitutional:  Alert and oriented, No acute distress. HEENT: [place] AT, moist mucus membranes.  Trachea midline, no masses. Cardiovascular: No clubbing, cyanosis, or edema. Respiratory: Normal respiratory effort, no increased work of breathing. GI: Abdomen is soft, nontender, nondistended, no abdominal masses GU: No CVA tenderness.  Lymph: No cervical or inguinal lymphadenopathy. Skin: No rashes, bruises or suspicious lesions. Neurologic: Grossly intact, no  focal deficits, moving all 4 extremities. Psychiatric: Normal mood and affect.  Laboratory Data: Lab Results  Component Value Date   WBC 8.2 05/05/2021   HGB 14.5 05/05/2021   HCT 43.6 05/05/2021   MCV 87 05/05/2021   PLT 268 05/05/2021    Lab Results  Component Value Date   CREATININE 0.90 08/08/2021    No results found for: "PSA"  No results found for: "TESTOSTERONE"  Lab Results  Component Value Date   HGBA1C 6.9 (H) 08/08/2021    Urinalysis    Component Value Date/Time   COLORURINE YELLOW 12/16/2013 1120   APPEARANCEUR Clear 09/14/2021 1338   LABSPEC 1.013 12/16/2013 1120   PHURINE 6.5 12/16/2013 1120   GLUCOSEU Negative 09/14/2021 1338   HGBUR MODERATE (A) 12/16/2013 1120   BILIRUBINUR Negative 09/14/2021 1338   KETONESUR NEGATIVE 12/16/2013 1120   PROTEINUR 2+ (A) 09/14/2021 1338   PROTEINUR 30 (A) 12/16/2013 1120   UROBILINOGEN negative 07/24/2014 1519   UROBILINOGEN 0.2 12/16/2013 1120   NITRITE Negative 09/14/2021 1338   NITRITE NEGATIVE 12/16/2013 1120   LEUKOCYTESUR 1+ (A) 09/14/2021 1338    Lab Results  Component Value Date   LABMICR See below: 09/14/2021   WBCUA >30 (A) 09/14/2021   LABEPIT 0-10 09/14/2021   MUCUS Present 09/14/2021   BACTERIA Few 09/14/2021    Pertinent Imaging:  Results for orders placed during the hospital encounter of 07/13/21  DG Abd 1 View  Narrative CLINICAL DATA:  Post lithotripsy with continued left flank pain.  EXAM: ABDOMEN - 1 VIEW  COMPARISON:  None.  FINDINGS: The bowel gas pattern is normal.  Right renal calculi are unchanged largest measuring 2.4 cm in transverse dimension.  Numerous left renal calculi are again seen measuring up to 1 cm. The majority are unchanged and measuring up to 1 cm. Overall, slightly decreased in size and number.  Osseous structures are stable.  IMPRESSION: 1. Left renal calculi have mildly decreased in size and number. 2. Stable right renal  calculi.   Electronically Signed By: Ronney Asters M.D. On: 07/14/2021 17:12  No results found for this or any previous visit.  No results found for this or any previous visit.  No results found for this or any previous visit.  No results found for this or any previous visit.  No results found for this or any previous visit.  Results for orders placed in  visit on 05/18/21  CT HEMATURIA WORKUP  Narrative CLINICAL DATA:  Hematuria over the last 3 weeks. History of diabetes and hypertension. Cystitis. Urinary incontinence.  EXAM: CT ABDOMEN AND PELVIS WITHOUT AND WITH CONTRAST  TECHNIQUE: Multidetector CT imaging of the abdomen and pelvis was performed following the standard protocol before and following the bolus administration of intravenous contrast.  RADIATION DOSE REDUCTION: This exam was performed according to the departmental dose-optimization program which includes automated exposure control, adjustment of the mA and/or kV according to patient size and/or use of iterative reconstruction technique.  CONTRAST:  172m OMNIPAQUE IOHEXOL 300 MG/ML  SOLN  COMPARISON:  CT abdomen 10/04/2001  FINDINGS: Lower chest: Mild scarring in the right middle lobe, right lower lobe, and lingula. 4 by 3 mm pleural-based nodule along the left lower lobe on image 61 series 15, not changed from 05/11/2020. Small to moderate-sized hiatal hernia. Mild cardiomegaly. Descending thoracic aortic atherosclerosis.  Hepatobiliary: Contracted gallbladder.  Otherwise unremarkable.  Pancreas: Unremarkable  Spleen: Unremarkable  Adrenals/Urinary Tract: Both adrenal glands appear normal.  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 A branching 2.2 by 1.7 cm collecting system staghorn calculus in the right renal pelvis is associated with lower pole and mid kidney caliectasis  2.5 by 1.8 cm filling defect in the right mid to lower kidney infundibulum and calyx proximal to the  stone on image 56 series 16 does not appear to enhance and accordingly favors plan thrombus over tumor. Similar filling defect is observed in much of the anterior kidney collecting system as shown for example on image 37 series 13. Within the blunted calices the presumed clot as outlined by excreted contrast.  There calcifications near the distal ureters but not within the distal ureters them cells. A separate right renal calculus is nonobstructive and measures 3 mm in diameter on image 34 series 2.  Which like the contralateral side, there is a staghorn calculus in the left mid kidney collecting system abutting the UPJ, measuring 1.7 cm in long axis. Release 5 additional nonobstructive renal calculi are present in the left kidney. There is no overt left hydronephrosis nor do we demonstrate additional casting filling defects in the left kidney. There is some mild left caliectasis particularly in upper pole example on image 32 series 13.  There is abnormal wall thickening in the right proximal ureter extending along 4.3 cm from the stone, possibly from inflammatory wall thickening, less likely to be from thrombus or tumor. Similar appearance on the left extending along 3.4 cm distal to the collecting system calculus in the left proximal ureter with only a thin channel of contrast in this vicinity.  Urinary bladder unremarkable.  Stomach/Bowel: Hiatal hernia.  Sigmoid colon diverticulosis.  Vascular/Lymphatic: Atherosclerosis is present, including aortoiliac atherosclerotic disease and atherosclerotic calcifications in branches of the abdominal aorta.  Reproductive: Uterus absent.  Adnexa unremarkable.  Other: No supplemental non-categorized findings.  Musculoskeletal: Thoracic and lumbar spondylosis and degenerative disc disease.  IMPRESSION: 1. Bilateral staghorn calculi in the collecting systems extending to the UPJ on both sides. These are associated with abnormal  thickening of the proximal ureters bilaterally, as well as mild bilateral hydronephrosis. 2. In the right renal collecting system, the large staghorn calculus is associated with abnormal noncalcified filling defect in anterior infundibula and calices probably from blood clot given the fact that this abnormal density filling the infundibulum and calices is not appreciably enhancing. Difficult to exclude small amounts of tumor in this region given the substantially abnormal appearance. See for  example image 37 series 13. 3. Renal atrophy and scarring with renal sinus lipomatosis. 4. Additional nonobstructive renal calculi are present bilaterally. 5. Stable 4 by 3 mm pleural-based nodule in the left lower lobe, not changed over the last year, thought to likely be benign. 6. Mild bibasilar scarring. 7. Other imaging findings of potential clinical significance: Small to moderate-sized hiatal hernia. Mild cardiomegaly. Sigmoid colon diverticulosis. Aortic Atherosclerosis (ICD10-I70.0). Systemic atherosclerosis. Thoracolumbar spondylosis and degenerative disc disease.   Electronically Signed By: Van Clines M.D. On: 05/23/2021 14:17  No results found for this or any previous visit.   Assessment & Plan:    1. Nephrolithiasis -patient scheduled for ureteroscopy 7/20 - Urinalysis, Routine w reflex microscopic  2. Urinary frequency -Gemtesa '75mg'$     No follow-ups on file.  Nicolette Bang, MD  [facility] [place]

## 2021-10-12 NOTE — H&P (View-Only) (Signed)
10/12/2021 1:45 PM   [name] 1945/12/15 [id number]  Referring provider: Sharion Balloon, [address],  [place] [address]  Right flank pain   HPI: Ms Felicia [name] is a 76yo here for followup with right flank pain.  She underwent right ureteroscopy 1 week ago and presents today with a 2 day history of worsening right flank pain and urinary frequency. She currently has a right ureteral stent in place. She is having intermittent gross hematuria. No fevers. No other LUTS.    PMH: Past Medical History:  Diagnosis Date   Asthma    Chronic systolic heart failure ([name])    Echo (08/26/13):  Mild LVH. EF 25% to 30%. Diffuse HK. Aortic valve: There was trivial regurgitation. Mitral valve: There was mild regurgitation. Left atrium: The atrium was mildly dilated.   GERD (gastroesophageal reflux disease)    Headache(784.0)    History of nephrolithiasis    Hypertension    NICM (nonischemic cardiomyopathy) ([name])    a. LHC (08/2013):  no CAD   Sleep apnea    does not use machine   Stroke (HCC)    Type II or unspecified type diabetes mellitus without mention of complication, uncontrolled    borderline    Surgical History: Past Surgical History:  Procedure Laterality Date   ABDOMINAL HYSTERECTOMY     COLONOSCOPY WITH PROPOFOL N/A 07/01/2019   Procedure: COLONOSCOPY WITH PROPOFOL;  Surgeon: Danie Binder, MD;  [facility];  Service: Endoscopy;  Laterality: N/A;  2:15pm - pt will not move up   [place], URETEROSCOPY AND STENT PLACEMENT Left 08/11/2021   Procedure: [place], URETEROSCOPY AND STENT PLACEMENT;  Surgeon: Cleon Gustin, MD;  Location: [facility];  Service: Urology;  Laterality: Left;   CYSTOSCOPY WITH RETROGRADE PYELOGRAM, URETEROSCOPY AND STENT PLACEMENT Left 09/08/2021   Procedure: CYSTOSCOPY WITH RETROGRADE PYELOGRAM, URETEROSCOPY AND STENT EXCHANGE;  Surgeon: Cleon Gustin, MD;   Location: [facility];  Service: Urology;  Laterality: Left;   CYSTOSCOPY WITH RETROGRADE PYELOGRAM, URETEROSCOPY AND STENT PLACEMENT Right 10/06/2021   Procedure: CYSTOSCOPY WITH RETROGRADE PYELOGRAM, URETEROSCOPY AND STENT PLACEMENT;  Surgeon: Cleon Gustin, MD;  Location: [facility];  Service: Urology;  Laterality: Right;   EXTRACORPOREAL SHOCK WAVE LITHOTRIPSY Left 05/31/2021   Procedure: EXTRACORPOREAL SHOCK WAVE LITHOTRIPSY (ESWL);  Surgeon: Cleon Gustin, MD;  Location: [facility];  Service: Urology;  Laterality: Left;   EXTRACORPOREAL SHOCK WAVE LITHOTRIPSY Left 06/28/2021   Procedure: EXTRACORPOREAL SHOCK WAVE LITHOTRIPSY (ESWL);  Surgeon: Cleon Gustin, MD;  Location: [facility];  Service: Urology;  Laterality: Left;   HOLMIUM LASER APPLICATION Left 12/13/7094   Procedure: HOLMIUM LASER APPLICATION;  Surgeon: Cleon Gustin, MD;  Location: [facility];  Service: Urology;  Laterality: Left;   HOLMIUM LASER APPLICATION Left 05/18/3660   Procedure: HOLMIUM LASER APPLICATION;  Surgeon: Cleon Gustin, MD;  Location: [facility];  Service: Urology;  Laterality: Left;   HOLMIUM LASER APPLICATION Right 9/47/6546   Procedure: HOLMIUM LASER APPLICATION;  Surgeon: Cleon Gustin, MD;  Location: [facility];  Service: Urology;  Laterality: Right;   LEFT AND RIGHT HEART CATHETERIZATION WITH CORONARY ANGIOGRAM N/A 09/03/2013   Procedure: LEFT AND RIGHT HEART CATHETERIZATION WITH CORONARY ANGIOGRAM;  Surgeon: Felicia Blanks, MD;  Location: [facility] CATH LAB;  Service: Cardiovascular;  Laterality: N/A;   OOPHORECTOMY     removed 1 ovary and 1 ovary remains.   POLYPECTOMY  07/01/2019   Procedure:  POLYPECTOMY;  Surgeon: Danie Binder, MD;  [facility];  Service: Endoscopy;;   STONE EXTRACTION WITH BASKET Left 09/08/2021   Procedure: STONE EXTRACTION WITH BASKET;  Surgeon: Cleon Gustin, MD;  Location: [facility];  Service: Urology;  Laterality: Left;   ureteral extraction of kidney stone       Home Medications:  Allergies as of 10/12/2021       Reactions   Levaquin [levofloxacin In D5w] Diarrhea   Penicillins Rash        Medication List        Accurate as of October 12, 2021  1:45 PM. If you have any questions, ask your nurse or doctor.          acetaminophen 500 MG tablet Commonly known as: TYLENOL Take 1,000 mg by mouth every 6 (six) hours as needed for moderate pain or headache.   albuterol 108 (90 Base) MCG/ACT inhaler Commonly known as: VENTOLIN HFA Inhale 2 puffs into the lungs every 6 (six) hours as needed for wheezing or shortness of breath.   alendronate 70 MG tablet Commonly known as: Fosamax Take 1 tablet (70 mg total) by mouth every 7 (seven) days. Take with a full glass of water on an empty stomach.   amLODipine 10 MG tablet Commonly known as: NORVASC Take 1 tablet (10 mg total) by mouth daily.   bisoprolol 10 MG tablet Commonly known as: ZEBETA Take 1 tablet (10 mg total) by mouth daily.   cefUROXime 500 MG tablet Commonly known as: CEFTIN Take 1 tablet (500 mg total) by mouth 2 (two) times daily with a meal.   diclofenac 75 MG EC tablet Commonly known as: VOLTAREN Take 1 tablet (75 mg total) by mouth 2 (two) times daily.   diclofenac Sodium 1 % Gel Commonly known as: Voltaren Apply 2 g topically 4 (four) times daily.   Entresto 97-103 MG Generic drug: sacubitril-valsartan Take 1 tablet by mouth twice daily   glucose blood test strip Commonly known as: OneTouch Verio Use as instructed   HYDROcodone-acetaminophen 5-325 MG tablet Commonly known as: Norco Take 1 tablet by mouth every 6 (six) hours as needed for moderate pain.   lisinopril 10 MG tablet Commonly known as: ZESTRIL Take 10 mg by mouth daily.   OneTouch Delica Lancets Fine Misc Use to check BG once daily   oxybutynin 10 MG 24 hr tablet Commonly known as: Ditropan XL Take 1 tablet (10 mg total) by mouth daily.   oxyCODONE-acetaminophen 5-325 MG  tablet Commonly known as: Percocet Take 1 tablet by mouth every 4 (four) hours as needed for severe pain.   rosuvastatin 10 MG tablet Commonly known as: Crestor Take 1 tablet (10 mg total) by mouth daily.   sulfamethoxazole-trimethoprim 800-160 MG tablet Commonly known as: BACTRIM DS Take 1 tablet by mouth 2 (two) times daily.   tamsulosin 0.4 MG Caps capsule Commonly known as: Flomax Take 1 capsule (0.4 mg total) by mouth daily after supper.   Vitamin D (Ergocalciferol) 1.25 MG (50000 UNIT) Caps capsule Commonly known as: DRISDOL Take 1 capsule (50,000 Units total) by mouth every 7 (seven) days.        Allergies:  Allergies  Allergen Reactions   Levaquin [Levofloxacin In D5w] Diarrhea   Penicillins Rash    Family History: Family History  Problem Relation Age of Onset   Hypertension Mother    Arthritis Mother    CAD Mother 49   Hip fracture Mother 71   COPD Father  Crohn's disease Sister    Cancer Sister        lung   Early death Brother 53       house fire   Diabetes Maternal Grandmother    Hypertension Sister    Hypertension Brother    Colon cancer Neg Hx     Social History:  reports that she has never smoked. She has never used smokeless tobacco. She reports current alcohol use. She reports that she does not use drugs.  ROS: All other review of systems were reviewed and are negative except what is noted above in HPI  Physical Exam: BP (!) 158/81   Pulse (!) 103   LMP 01/22/1981 (LMP Unknown)   Constitutional:  Alert and oriented, No acute distress. HEENT: [place] AT, moist mucus membranes.  Trachea midline, no masses. Cardiovascular: No clubbing, cyanosis, or edema. Respiratory: Normal respiratory effort, no increased work of breathing. GI: Abdomen is soft, nontender, nondistended, no abdominal masses GU: No CVA tenderness.  Lymph: No cervical or inguinal lymphadenopathy. Skin: No rashes, bruises or suspicious lesions. Neurologic: Grossly intact, no  focal deficits, moving all 4 extremities. Psychiatric: Normal mood and affect.  Laboratory Data: Lab Results  Component Value Date   WBC 8.2 05/05/2021   HGB 14.5 05/05/2021   HCT 43.6 05/05/2021   MCV 87 05/05/2021   PLT 268 05/05/2021    Lab Results  Component Value Date   CREATININE 0.90 08/08/2021    No results found for: "PSA"  No results found for: "TESTOSTERONE"  Lab Results  Component Value Date   HGBA1C 6.9 (H) 08/08/2021    Urinalysis    Component Value Date/Time   COLORURINE YELLOW 12/16/2013 1120   APPEARANCEUR Clear 09/14/2021 1338   LABSPEC 1.013 12/16/2013 1120   PHURINE 6.5 12/16/2013 1120   GLUCOSEU Negative 09/14/2021 1338   HGBUR MODERATE (A) 12/16/2013 1120   BILIRUBINUR Negative 09/14/2021 1338   KETONESUR NEGATIVE 12/16/2013 1120   PROTEINUR 2+ (A) 09/14/2021 1338   PROTEINUR 30 (A) 12/16/2013 1120   UROBILINOGEN negative 07/24/2014 1519   UROBILINOGEN 0.2 12/16/2013 1120   NITRITE Negative 09/14/2021 1338   NITRITE NEGATIVE 12/16/2013 1120   LEUKOCYTESUR 1+ (A) 09/14/2021 1338    Lab Results  Component Value Date   LABMICR See below: 09/14/2021   WBCUA >30 (A) 09/14/2021   LABEPIT 0-10 09/14/2021   MUCUS Present 09/14/2021   BACTERIA Few 09/14/2021    Pertinent Imaging:  Results for orders placed during the hospital encounter of 07/13/21  DG Abd 1 View  Narrative CLINICAL DATA:  Post lithotripsy with continued left flank pain.  EXAM: ABDOMEN - 1 VIEW  COMPARISON:  None.  FINDINGS: The bowel gas pattern is normal.  Right renal calculi are unchanged largest measuring 2.4 cm in transverse dimension.  Numerous left renal calculi are again seen measuring up to 1 cm. The majority are unchanged and measuring up to 1 cm. Overall, slightly decreased in size and number.  Osseous structures are stable.  IMPRESSION: 1. Left renal calculi have mildly decreased in size and number. 2. Stable right renal  calculi.   Electronically Signed By: Ronney Asters M.D. On: 07/14/2021 17:12  No results found for this or any previous visit.  No results found for this or any previous visit.  No results found for this or any previous visit.  No results found for this or any previous visit.  No results found for this or any previous visit.  Results for orders placed in  visit on 05/18/21  CT HEMATURIA WORKUP  Narrative CLINICAL DATA:  Hematuria over the last 3 weeks. History of diabetes and hypertension. Cystitis. Urinary incontinence.  EXAM: CT ABDOMEN AND PELVIS WITHOUT AND WITH CONTRAST  TECHNIQUE: Multidetector CT imaging of the abdomen and pelvis was performed following the standard protocol before and following the bolus administration of intravenous contrast.  RADIATION DOSE REDUCTION: This exam was performed according to the departmental dose-optimization program which includes automated exposure control, adjustment of the mA and/or kV according to patient size and/or use of iterative reconstruction technique.  CONTRAST:  123m OMNIPAQUE IOHEXOL 300 MG/ML  SOLN  COMPARISON:  CT abdomen 10/04/2001  FINDINGS: Lower chest: Mild scarring in the right middle lobe, right lower lobe, and lingula. 4 by 3 mm pleural-based nodule along the left lower lobe on image 61 series 15, not changed from 05/11/2020. Small to moderate-sized hiatal hernia. Mild cardiomegaly. Descending thoracic aortic atherosclerosis.  Hepatobiliary: Contracted gallbladder.  Otherwise unremarkable.  Pancreas: Unremarkable  Spleen: Unremarkable  Adrenals/Urinary Tract: Both adrenal glands appear normal.  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 A branching 2.2 by 1.7 cm collecting system staghorn calculus in the right renal pelvis is associated with lower pole and mid kidney caliectasis  2.5 by 1.8 cm filling defect in the right mid to lower kidney infundibulum and calyx proximal to the  stone on image 56 series 16 does not appear to enhance and accordingly favors plan thrombus over tumor. Similar filling defect is observed in much of the anterior kidney collecting system as shown for example on image 37 series 13. Within the blunted calices the presumed clot as outlined by excreted contrast.  There calcifications near the distal ureters but not within the distal ureters them cells. A separate right renal calculus is nonobstructive and measures 3 mm in diameter on image 34 series 2.  Which like the contralateral side, there is a staghorn calculus in the left mid kidney collecting system abutting the UPJ, measuring 1.7 cm in long axis. Release 5 additional nonobstructive renal calculi are present in the left kidney. There is no overt left hydronephrosis nor do we demonstrate additional casting filling defects in the left kidney. There is some mild left caliectasis particularly in upper pole example on image 32 series 13.  There is abnormal wall thickening in the right proximal ureter extending along 4.3 cm from the stone, possibly from inflammatory wall thickening, less likely to be from thrombus or tumor. Similar appearance on the left extending along 3.4 cm distal to the collecting system calculus in the left proximal ureter with only a thin channel of contrast in this vicinity.  Urinary bladder unremarkable.  Stomach/Bowel: Hiatal hernia.  Sigmoid colon diverticulosis.  Vascular/Lymphatic: Atherosclerosis is present, including aortoiliac atherosclerotic disease and atherosclerotic calcifications in branches of the abdominal aorta.  Reproductive: Uterus absent.  Adnexa unremarkable.  Other: No supplemental non-categorized findings.  Musculoskeletal: Thoracic and lumbar spondylosis and degenerative disc disease.  IMPRESSION: 1. Bilateral staghorn calculi in the collecting systems extending to the UPJ on both sides. These are associated with abnormal  thickening of the proximal ureters bilaterally, as well as mild bilateral hydronephrosis. 2. In the right renal collecting system, the large staghorn calculus is associated with abnormal noncalcified filling defect in anterior infundibula and calices probably from blood clot given the fact that this abnormal density filling the infundibulum and calices is not appreciably enhancing. Difficult to exclude small amounts of tumor in this region given the substantially abnormal appearance. See for  example image 37 series 13. 3. Renal atrophy and scarring with renal sinus lipomatosis. 4. Additional nonobstructive renal calculi are present bilaterally. 5. Stable 4 by 3 mm pleural-based nodule in the left lower lobe, not changed over the last year, thought to likely be benign. 6. Mild bibasilar scarring. 7. Other imaging findings of potential clinical significance: Small to moderate-sized hiatal hernia. Mild cardiomegaly. Sigmoid colon diverticulosis. Aortic Atherosclerosis (ICD10-I70.0). Systemic atherosclerosis. Thoracolumbar spondylosis and degenerative disc disease.   Electronically Signed By: Van Clines M.D. On: 05/23/2021 14:17  No results found for this or any previous visit.   Assessment & Plan:    1. Nephrolithiasis -patient scheduled for ureteroscopy 7/20 - Urinalysis, Routine w reflex microscopic  2. Urinary frequency -Gemtesa '75mg'$     No follow-ups on file.  Nicolette Bang, MD  [facility] [place]

## 2021-10-16 LAB — URINE CULTURE

## 2021-10-18 ENCOUNTER — Encounter: Payer: Self-pay | Admitting: Urology

## 2021-10-18 DIAGNOSIS — H90A21 Sensorineural hearing loss, unilateral, right ear, with restricted hearing on the contralateral side: Secondary | ICD-10-CM | POA: Diagnosis not present

## 2021-10-18 DIAGNOSIS — H90A32 Mixed conductive and sensorineural hearing loss, unilateral, left ear with restricted hearing on the contralateral side: Secondary | ICD-10-CM | POA: Diagnosis not present

## 2021-10-18 NOTE — Patient Instructions (Signed)
Dietary Guidelines to Help Prevent Kidney Stones Kidney stones are deposits of minerals and salts that form inside your kidneys. Your risk of developing kidney stones may be greater depending on your diet, your lifestyle, the medicines you take, and whether you have certain medical conditions. Most people can lower their chances of developing kidney stones by following the instructions below. Your dietitian may give you more specific instructions depending on your overall health and the type of kidney stones you tend to develop. What are tips for following this plan? Reading food labels  Choose foods with "no salt added" or "low-salt" labels. Limit your salt (sodium) intake to less than 1,500 mg a day. Choose foods with calcium for each meal and snack. Try to eat about 300 mg of calcium at each meal. Foods that contain 200-500 mg of calcium a serving include: 8 oz (237 mL) of milk, calcium-fortifiednon-dairy milk, and calcium-fortifiedfruit juice. Calcium-fortified means that calcium has been added to these drinks. 8 oz (237 mL) of kefir, yogurt, and soy yogurt. 4 oz (114 g) of tofu. 1 oz (28 g) of cheese. 1 cup (150 g) of dried figs. 1 cup (91 g) of cooked broccoli. One 3 oz (85 g) can of sardines or mackerel. Most people need 1,000-1,500 mg of calcium a day. Talk to your dietitian about how much calcium is recommended for you. Shopping Buy plenty of fresh fruits and vegetables. Most people do not need to avoid fruits and vegetables, even if these foods contain nutrients that may contribute to kidney stones. When shopping for convenience foods, choose: Whole pieces of fruit. Pre-made salads with dressing on the side. Low-fat fruit and yogurt smoothies. Avoid buying frozen meals or prepared deli foods. These can be high in sodium. Look for foods with live cultures, such as yogurt and kefir. Choose high-fiber grains, such as whole-wheat breads, oat bran, and wheat cereals. Cooking Do not add  salt to food when cooking. Place a salt shaker on the table and allow each person to add his or her own salt to taste. Use vegetable protein, such as beans, textured vegetable protein (TVP), or tofu, instead of meat in pasta, casseroles, and soups. Meal planning Eat less salt, if told by your dietitian. To do this: Avoid eating processed or pre-made food. Avoid eating fast food. Eat less animal protein, including cheese, meat, poultry, or fish, if told by your dietitian. To do this: Limit the number of times you have meat, poultry, fish, or cheese each week. Eat a diet free of meat at least 2 days a week. Eat only one serving each day of meat, poultry, fish, or seafood. When you prepare animal protein, cut pieces into small portion sizes. For most meat and fish, one serving is about the size of the palm of your hand. Eat at least five servings of fresh fruits and vegetables each day. To do this: Keep fruits and vegetables on hand for snacks. Eat one piece of fruit or a handful of berries with breakfast. Have a salad and fruit at lunch. Have two kinds of vegetables at dinner. Limit foods that are high in a substance called oxalate. These include: Spinach (cooked), rhubarb, beets, sweet potatoes, and Swiss chard. Peanuts. Potato chips, french fries, and baked potatoes with skin on. Nuts and nut products. Chocolate. If you regularly take a diuretic medicine, make sure to eat at least 1 or 2 servings of fruits or vegetables that are high in potassium each day. These include: Avocado. Banana. Orange, prune,   carrot, or tomato juice. Baked potato. Cabbage. Beans and split peas. Lifestyle  Drink enough fluid to keep your urine pale yellow. This is the most important thing you can do. Spread your fluid intake throughout the day. If you drink alcohol: Limit how much you use to: 0-1 drink a day for women who are not pregnant. 0-2 drinks a day for men. Be aware of how much alcohol is in your  drink. In the [place] one drink equals one 12 oz bottle of beer (355 mL), one 5 oz glass of wine (148 mL), or one 1 oz glass of hard liquor (44 mL). Lose weight if told by your health care provider. Work with your dietitian to find an eating plan and weight loss strategies that work best for you. General information Talk to your health care provider and dietitian about taking daily supplements. You may be told the following depending on your health and the cause of your kidney stones: Not to take supplements with vitamin C. To take a calcium supplement. To take a daily probiotic supplement. To take other supplements such as magnesium, fish oil, or vitamin B6. Take over-the-counter and prescription medicines only as told by your health care provider. These include supplements. What foods should I limit? Limit your intake of the following foods, or eat them as told by your dietitian. Vegetables Spinach. Rhubarb. Beets. Canned vegetables. Pickles. Olives. Baked potatoes with skin. Grains Wheat bran. Baked goods. Salted crackers. Cereals high in sugar. Meats and other proteins Nuts. Nut butters. Large portions of meat, poultry, or fish. Salted, precooked, or cured meats, such as sausages, meat loaves, and hot dogs. Dairy Cheese. Beverages Regular soft drinks. Regular vegetable juice. Seasonings and condiments Seasoning blends with salt. Salad dressings. Soy sauce. Ketchup. Barbecue sauce. Other foods Canned soups. Canned pasta sauce. Casseroles. Pizza. Lasagna. Frozen meals. Potato chips. French fries. The items listed above may not be a complete list of foods and beverages you should limit. Contact a dietitian for more information. What foods should I avoid? Talk to your dietitian about specific foods you should avoid based on the type of kidney stones you have and your overall health. Fruits Grapefruit. The item listed above may not be a complete list of foods and beverages you should  avoid. Contact a dietitian for more information. Summary Kidney stones are deposits of minerals and salts that form inside your kidneys. You can lower your risk of kidney stones by making changes to your diet. The most important thing you can do is drink enough fluid. Drink enough fluid to keep your urine pale yellow. Talk to your dietitian about how much calcium you should have each day, and eat less salt and animal protein as told by your dietitian. This information is not intended to replace advice given to you by your health care provider. Make sure you discuss any questions you have with your health care provider. Document Revised: 12/06/2020 Document Reviewed: 12/06/2020 Elsevier Patient Education  2023 Elsevier Inc.  

## 2021-10-24 NOTE — Patient Instructions (Signed)
[name]  10/24/2021     '@PREFPERIOPPHARMACY'$ @   Your procedure is scheduled on 10/27/2021.   Report to [facility] at  1030  A.M.   Call this number if you have problems the morning of surgery:  [phone]   Remember:  Do not eat or drink after midnight.       DO NOT take any medications for diabetes the morning of your procedure.     Take these medicines the morning of surgery with A SIP OF WATER        zebeta, norco or percocet(if needed), entresto.     Do not wear jewelry, make-up or nail polish.  Do not wear lotions, powders, or perfumes, or deodorant.  Do not shave 48 hours prior to surgery.  Men may shave face and neck.  Do not bring valuables to the hospital.  [facility] is not responsible for any belongings or valuables.  Contacts, dentures or bridgework may not be worn into surgery.  Leave your suitcase in the car.  After surgery it may be brought to your room.  For patients admitted to the hospital, discharge time will be determined by your treatment team.  Patients discharged the day of surgery will not be allowed to drive home and must have someone with them for 24 hours.    Special instructions:   DO NOT smoke tobacco or vape for 24 hours before your procedure.  Please read over the following fact sheets that you were given. Coughing and Deep Breathing, Surgical Site Infection Prevention, Anesthesia Post-op Instructions, and Care and Recovery After Surgery      Ureteral Stent Implantation, Care After This sheet gives you information about how to care for yourself after your procedure. Your health care provider may also give you more specific instructions. If you have problems or questions, contact your health care provider. What can I expect after the procedure? After the procedure, it is common to have: Nausea. Mild pain when you urinate. You may feel this pain in your lower back or lower abdomen. The pain should stop within a few  minutes after you urinate. This may last for up to 1 week. A small amount of blood in your urine for several days. Follow these instructions at home: Medicines Take over-the-counter and prescription medicines only as told by your health care provider. If you were prescribed an antibiotic medicine, take it as told by your health care provider. Do not stop taking the antibiotic even if you start to feel better. Do not drive for 24 hours if you were given a sedative during your procedure. Ask your health care provider if the medicine prescribed to you requires you to avoid driving or using heavy machinery. Activity Rest as told by your health care provider. Avoid sitting for a long time without moving. Get up to take short walks every 1-2 hours. This is important to improve blood flow and breathing. Ask for help if you feel weak or unsteady. Return to your normal activities as told by your health care provider. Ask your health care provider what activities are safe for you. General instructions  Watch for any blood in your urine. Call your health care provider if the amount of blood in your urine increases. If you have a catheter: Follow instructions from your health care provider about taking care of your catheter and collection bag. Do not take baths, swim, or use a hot tub until your health care provider  approves. Ask your health care provider if you may take showers. You may only be allowed to take sponge baths. Drink enough fluid to keep your urine pale yellow. Do not use any products that contain nicotine or tobacco, such as cigarettes, e-cigarettes, and chewing tobacco. These can delay healing after surgery. If you need help quitting, ask your health care provider. Keep all follow-up visits as told by your health care provider. This is important. Contact a health care provider if: You have pain that gets worse or does not get better with medicine, especially pain when you urinate. You have  difficulty urinating. You feel nauseous or you vomit repeatedly during a period of more than 2 days after the procedure. Get help right away if: Your urine is dark red or has blood clots in it. You are leaking urine (have incontinence). The end of the stent comes out of your urethra. You cannot urinate. You have sudden, sharp, or severe pain in your abdomen or lower back. You have a fever. You have swelling or pain in your legs. You have difficulty breathing. Summary After the procedure, it is common to have mild pain when you urinate that goes away within a few minutes after you urinate. This may last for up to 1 week. Watch for any blood in your urine. Call your health care provider if the amount of blood in your urine increases. Take over-the-counter and prescription medicines only as told by your health care provider. Drink enough fluid to keep your urine pale yellow. This information is not intended to replace advice given to you by your health care provider. Make sure you discuss any questions you have with your health care provider. Document Revised: 02/21/2021 Document Reviewed: 01/31/2021 Elsevier Patient Education  2022 [place] Anesthesia, Adult, Care After This sheet gives you information about how to care for yourself after your procedure. Your health care provider may also give you more specific instructions. If you have problems or questions, contact your health care provider. What can I expect after the procedure? After the procedure, the following side effects are common: Pain or discomfort at the IV site. Nausea. Vomiting. Sore throat. Trouble concentrating. Feeling cold or chills. Feeling weak or tired. Sleepiness and fatigue. Soreness and body aches. These side effects can affect parts of the body that were not involved in surgery. Follow these instructions at home: For the time period you were told by your health care provider:  Rest. Do not  participate in activities where you could fall or become injured. Do not drive or use machinery. Do not drink alcohol. Do not take sleeping pills or medicines that cause drowsiness. Do not make important decisions or sign legal documents. Do not take care of children on your own. Eating and drinking Follow any instructions from your health care provider about eating or drinking restrictions. When you feel hungry, start by eating small amounts of foods that are soft and easy to digest (bland), such as toast. Gradually return to your regular diet. Drink enough fluid to keep your urine pale yellow. If you vomit, rehydrate by drinking water, juice, or clear broth. General instructions If you have sleep apnea, surgery and certain medicines can increase your risk for breathing problems. Follow instructions from your health care provider about wearing your sleep device: Anytime you are sleeping, including during daytime naps. While taking prescription pain medicines, sleeping medicines, or medicines that make you drowsy. Have a responsible adult stay with you for the time you  are told. It is important to have someone help care for you until you are awake and alert. Return to your normal activities as told by your health care provider. Ask your health care provider what activities are safe for you. Take over-the-counter and prescription medicines only as told by your health care provider. If you smoke, do not smoke without supervision. Keep all follow-up visits as told by your health care provider. This is important. Contact a health care provider if: You have nausea or vomiting that does not get better with medicine. You cannot eat or drink without vomiting. You have pain that does not get better with medicine. You are unable to pass urine. You develop a skin rash. You have a fever. You have redness around your IV site that gets worse. Get help right away if: You have difficulty breathing. You  have chest pain. You have blood in your urine or stool, or you vomit blood. Summary After the procedure, it is common to have a sore throat or nausea. It is also common to feel tired. Have a responsible adult stay with you for the time you are told. It is important to have someone help care for you until you are awake and alert. When you feel hungry, start by eating small amounts of foods that are soft and easy to digest (bland), such as toast. Gradually return to your regular diet. Drink enough fluid to keep your urine pale yellow. Return to your normal activities as told by your health care provider. Ask your health care provider what activities are safe for you. This information is not intended to replace advice given to you by your health care provider. Make sure you discuss any questions you have with your health care provider. Document Revised: 12/11/2019 Document Reviewed: 07/10/2019 Elsevier Patient Education  [place].

## 2021-10-25 ENCOUNTER — Encounter (HOSPITAL_COMMUNITY): Payer: Self-pay

## 2021-10-25 ENCOUNTER — Encounter (HOSPITAL_COMMUNITY)
Admission: RE | Admit: 2021-10-25 | Discharge: 2021-10-25 | Disposition: A | Discharge status: Home / Self Care | Payer: Medicare HMO | Source: Ambulatory Visit | Attending: Urology | Admitting: Urology

## 2021-10-25 VITALS — BP 158/58 | HR 59 | Temp 97.7°F | Resp 18 | Ht 64.0 in | Wt 149.9 lb

## 2021-10-25 DIAGNOSIS — E1169 Type 2 diabetes mellitus with other specified complication: Secondary | ICD-10-CM

## 2021-10-25 DIAGNOSIS — E119 Type 2 diabetes mellitus without complications: Secondary | ICD-10-CM | POA: Insufficient documentation

## 2021-10-25 DIAGNOSIS — Z01812 Encounter for preprocedural laboratory examination: Secondary | ICD-10-CM | POA: Diagnosis not present

## 2021-10-25 HISTORY — DX: Personal history of urinary calculi: Z87.442

## 2021-10-25 LAB — BASIC METABOLIC PANEL
Anion gap: 7 (ref 5–15)
BUN: 20 mg/dL (ref 8–23)
CO2: 24 mmol/L (ref 22–32)
Calcium: 9.1 mg/dL (ref 8.9–10.3)
Chloride: 113 mmol/L — ABNORMAL HIGH (ref 98–111)
Creatinine, Ser: 0.84 mg/dL (ref 0.44–1.00)
GFR, Estimated: >60 mL/min (ref >60)
Glucose, Bld: 104 mg/dL — ABNORMAL HIGH (ref 70–99)
Potassium: 3.5 mmol/L (ref 3.5–5.1)
Sodium: 144 mmol/L (ref 135–145)

## 2021-10-25 LAB — HEMOGLOBIN A1C
Hgb A1c MFr Bld: 7.1 % — ABNORMAL HIGH (ref 4.8–5.6)
Mean Plasma Glucose: 157.07 mg/dL

## 2021-10-27 ENCOUNTER — Ambulatory Visit (HOSPITAL_COMMUNITY)
Admission: RE | Admit: 2021-10-27 | Discharge: 2021-10-27 | Disposition: A | Discharge status: Home / Self Care | Payer: Medicare HMO | Attending: Urology | Admitting: Urology

## 2021-10-27 ENCOUNTER — Encounter (HOSPITAL_COMMUNITY)
Admission: RE | Disposition: A | Discharge status: Home / Self Care | Payer: Self-pay | Source: Home / Self Care | Attending: Urology

## 2021-10-27 ENCOUNTER — Ambulatory Visit (HOSPITAL_COMMUNITY): Payer: Medicare HMO | Admitting: Anesthesiology

## 2021-10-27 ENCOUNTER — Ambulatory Visit (HOSPITAL_COMMUNITY): Payer: Medicare HMO

## 2021-10-27 ENCOUNTER — Ambulatory Visit (HOSPITAL_BASED_OUTPATIENT_CLINIC_OR_DEPARTMENT_OTHER): Payer: Medicare HMO | Admitting: Anesthesiology

## 2021-10-27 DIAGNOSIS — Z7984 Long term (current) use of oral hypoglycemic drugs: Secondary | ICD-10-CM

## 2021-10-27 DIAGNOSIS — E119 Type 2 diabetes mellitus without complications: Secondary | ICD-10-CM | POA: Diagnosis not present

## 2021-10-27 DIAGNOSIS — G4730 Sleep apnea, unspecified: Secondary | ICD-10-CM | POA: Diagnosis not present

## 2021-10-27 DIAGNOSIS — I69911 Memory deficit following unspecified cerebrovascular disease: Secondary | ICD-10-CM | POA: Diagnosis not present

## 2021-10-27 DIAGNOSIS — R011 Cardiac murmur, unspecified: Secondary | ICD-10-CM | POA: Insufficient documentation

## 2021-10-27 DIAGNOSIS — I10 Essential (primary) hypertension: Secondary | ICD-10-CM | POA: Diagnosis not present

## 2021-10-27 DIAGNOSIS — E1169 Type 2 diabetes mellitus with other specified complication: Secondary | ICD-10-CM

## 2021-10-27 DIAGNOSIS — J45909 Unspecified asthma, uncomplicated: Secondary | ICD-10-CM | POA: Diagnosis not present

## 2021-10-27 DIAGNOSIS — N200 Calculus of kidney: Secondary | ICD-10-CM | POA: Insufficient documentation

## 2021-10-27 DIAGNOSIS — N202 Calculus of kidney with calculus of ureter: Secondary | ICD-10-CM | POA: Diagnosis not present

## 2021-10-27 DIAGNOSIS — Z87442 Personal history of urinary calculi: Secondary | ICD-10-CM

## 2021-10-27 HISTORY — PX: CYSTOSCOPY WITH RETROGRADE PYELOGRAM, URETEROSCOPY AND STENT PLACEMENT: SHX5789

## 2021-10-27 HISTORY — PX: STONE EXTRACTION WITH BASKET: SHX5318

## 2021-10-27 HISTORY — PX: HOLMIUM LASER APPLICATION: SHX5852

## 2021-10-27 LAB — GLUCOSE, CAPILLARY: Glucose-Capillary: 135 mg/dL — ABNORMAL HIGH (ref 70–99)

## 2021-10-27 SURGERY — CYSTOURETEROSCOPY, WITH RETROGRADE PYELOGRAM AND STENT INSERTION
Anesthesia: General | Site: Ureter | Laterality: Right

## 2021-10-27 MED ORDER — OXYCODONE-ACETAMINOPHEN 5-325 MG PO TABS
1.0000 | ORAL_TABLET | ORAL | 0 refills | Status: DC | PRN
Start: 2021-10-27 — End: 2022-09-20

## 2021-10-27 MED ORDER — SODIUM CHLORIDE 0.9 % IR SOLN
Status: DC | PRN
  Administered 2021-10-27 (×2): 3000 mL

## 2021-10-27 MED ORDER — LACTATED RINGERS IV SOLN: INTRAVENOUS | Status: DC

## 2021-10-27 MED ORDER — LIDOCAINE HCL (CARDIAC) PF 100 MG/5ML IV SOSY
PREFILLED_SYRINGE | INTRAVENOUS | Status: DC | PRN
  Administered 2021-10-27: 80 mg via INTRAVENOUS

## 2021-10-27 MED ORDER — CHLORHEXIDINE GLUCONATE 0.12 % MT SOLN
OROMUCOSAL | Status: AC
  Filled 2021-10-27: qty 15

## 2021-10-27 MED ORDER — ORAL CARE MOUTH RINSE: 15.0000 mL | Freq: Once | OROMUCOSAL | Status: AC

## 2021-10-27 MED ORDER — LIDOCAINE HCL (PF) 2 % IJ SOLN
INTRAMUSCULAR | Status: AC
  Filled 2021-10-27: qty 5

## 2021-10-27 MED ORDER — SODIUM CHLORIDE 0.9 % IV SOLN
INTRAVENOUS | Status: AC
  Filled 2021-10-27: qty 20

## 2021-10-27 MED ORDER — DEXAMETHASONE SODIUM PHOSPHATE 10 MG/ML IJ SOLN
INTRAMUSCULAR | Status: DC | PRN
  Administered 2021-10-27: 10 mg via INTRAVENOUS

## 2021-10-27 MED ORDER — OXYCODONE-ACETAMINOPHEN 5-325 MG PO TABS
1.0000 | ORAL_TABLET | Freq: Once | ORAL | Status: AC
  Administered 2021-10-27: 1 via ORAL
  Filled 2021-10-27: qty 1

## 2021-10-27 MED ORDER — ONDANSETRON HCL 4 MG/2ML IJ SOLN
INTRAMUSCULAR | Status: DC | PRN
  Administered 2021-10-27: 4 mg via INTRAVENOUS

## 2021-10-27 MED ORDER — FENTANYL CITRATE (PF) 100 MCG/2ML IJ SOLN
INTRAMUSCULAR | Status: DC | PRN
  Administered 2021-10-27: 25 ug via INTRAVENOUS

## 2021-10-27 MED ORDER — DIATRIZOATE MEGLUMINE 30 % UR SOLN
URETHRAL | Status: AC
  Filled 2021-10-27: qty 100

## 2021-10-27 MED ORDER — ONDANSETRON HCL 4 MG/2ML IJ SOLN
INTRAMUSCULAR | Status: AC
  Filled 2021-10-27: qty 2

## 2021-10-27 MED ORDER — SEVOFLURANE IN SOLN
RESPIRATORY_TRACT | Status: AC
  Filled 2021-10-27: qty 250

## 2021-10-27 MED ORDER — HYDROMORPHONE HCL 1 MG/ML IJ SOLN
0.2500 mg | INTRAMUSCULAR | Status: DC | PRN
  Administered 2021-10-27: 0.25 mg via INTRAVENOUS
  Filled 2021-10-27: qty 0.5

## 2021-10-27 MED ORDER — FENTANYL CITRATE (PF) 100 MCG/2ML IJ SOLN
INTRAMUSCULAR | Status: AC
  Filled 2021-10-27: qty 2

## 2021-10-27 MED ORDER — PROPOFOL 10 MG/ML IV BOLUS
INTRAVENOUS | Status: DC | PRN
  Administered 2021-10-27: 120 mg via INTRAVENOUS

## 2021-10-27 MED ORDER — SODIUM CHLORIDE 0.9 % IV SOLN
2.0000 g | INTRAVENOUS | Status: AC
  Administered 2021-10-27: 2 g via INTRAVENOUS

## 2021-10-27 MED ORDER — WATER FOR IRRIGATION, STERILE IR SOLN
Status: DC | PRN
  Administered 2021-10-27: 500 mL

## 2021-10-27 MED ORDER — DIATRIZOATE MEGLUMINE 30 % UR SOLN
URETHRAL | Status: DC | PRN
  Administered 2021-10-27: 300 mL via URETHRAL

## 2021-10-27 MED ORDER — SODIUM CHLORIDE 0.9 % IV SOLN: INTRAVENOUS | Status: DC

## 2021-10-27 MED ORDER — CHLORHEXIDINE GLUCONATE 0.12 % MT SOLN
15.0000 mL | Freq: Once | OROMUCOSAL | Status: AC
  Administered 2021-10-27: 15 mL via OROMUCOSAL

## 2021-10-27 SURGICAL SUPPLY — 26 items
BAG DRAIN URO TABLE W/ADPT NS (BAG) ×3 IMPLANT
BAG DRN 8 ADPR NS SKTRN CSTL (BAG) ×2
BAG HAMPER (MISCELLANEOUS) ×3 IMPLANT
CATH INTERMIT  6FR 70CM (CATHETERS) ×3 IMPLANT
CLOTH BEACON ORANGE TIMEOUT ST (SAFETY) ×3 IMPLANT
EXTRACTOR STONE NITINOL NGAGE (UROLOGICAL SUPPLIES) ×1 IMPLANT
GLOVE BIO SURGEON STRL SZ8 (GLOVE) ×3 IMPLANT
GLOVE BIOGEL PI IND STRL 7.0 (GLOVE) ×4 IMPLANT
GLOVE BIOGEL PI INDICATOR 7.0 (GLOVE) ×2
GOWN STRL REUS W/TWL LRG LVL3 (GOWN DISPOSABLE) ×3 IMPLANT
GOWN STRL REUS W/TWL XL LVL3 (GOWN DISPOSABLE) ×3 IMPLANT
GUIDEWIRE STR DUAL SENSOR (WIRE) ×3 IMPLANT
GUIDEWIRE STR ZIPWIRE 035X150 (MISCELLANEOUS) ×3 IMPLANT
IV NS IRRIG 3000ML ARTHROMATIC (IV SOLUTION) ×6 IMPLANT
KIT TURNOVER CYSTO (KITS) ×3 IMPLANT
MANIFOLD NEPTUNE II (INSTRUMENTS) ×3 IMPLANT
PACK CYSTO (CUSTOM PROCEDURE TRAY) ×3 IMPLANT
PAD ARMBOARD 7.5X6 YLW CONV (MISCELLANEOUS) ×3 IMPLANT
SHEATH URETERAL 12FRX35CM (MISCELLANEOUS) ×1 IMPLANT
STENT URET 6FRX24 CONTOUR (STENTS) ×1 IMPLANT
SYR 10ML LL (SYRINGE) ×3 IMPLANT
SYR CONTROL 10ML LL (SYRINGE) ×3 IMPLANT
TOWEL OR 17X26 4PK STRL BLUE (TOWEL DISPOSABLE) ×3 IMPLANT
TRACTIP FLEXIVA PULS ID 200XHI (Laser) IMPLANT
TRACTIP FLEXIVA PULSE ID 200 (Laser) ×3
WATER STERILE IRR 500ML POUR (IV SOLUTION) ×3 IMPLANT

## 2021-10-27 NOTE — Op Note (Signed)
Preoperative diagnosis: Right renal stone  Postoperative diagnosis: Same  Procedure: 1 cystoscopy 2. Right retrograde pyelography 3.  Intraoperative fluoroscopy, under one hour, with interpretation 4.  Right ureteroscopic stone manipulation with laser lithotripsy 5.  right 6 x 24 JJ stent placement  Attending: Rosie Fate  Anesthesia: General  Estimated blood loss: None  Drains: Right 6 x 24 JJ ureteral stent without tether  Specimens: stone for analysis  Antibiotics: ancef  Findings: lower pole stone. 1cm fragments unable to be removed due to acute angle of a lower pole calyx. No hydronephrosis. No masses/lesions in the bladder. Ureteral orifices in normal anatomic location.  Indications: Patient is a 76 year old female with a history of right renal stone.  After discussing treatment options, she decided proceed with right ureteroscopic stone manipulation.  Procedure in detail: The patient was brought to the operating room and a brief timeout was done to ensure correct patient, correct procedure, correct site.  General anesthesia was administered patient was placed in dorsal lithotomy position.  Her genitalia was then prepped and draped in usual sterile fashion.  A rigid 45 French cystoscope was passed in the urethra and the bladder.  Bladder was inspected free masses or lesions.  the ureteral orifices were in the normal orthotopic locations.  a 6 french ureteral catheter was then instilled into the right ureteral orifice.  a gentle retrograde was obtained and findings noted above. Using a grasper the right ureteral stent was brought to the urethral meatus.  we then placed a zip wire through the ureteral ureteral and advanced up to the renal pelvis.  we then removed the cystoscope and cannulated the right ureteral orifice with a semirigid ureteroscope.  Multiple small calculi were encountered in the ureter which were removed with an NGage basket. Once we reached the UPJ a sensor wire  was advanced in to the renal pelvis. We then removed the ureteroscope and advanced am 12/14 x 35cm access sheath up to the renal pelvis. We then used the flexible ureteroscope to perform nephroscopy. We encountered the stone in the lower pole. Using a 242nm laser fiber the stone was fragmented and the fragments were removed with a Ngage basket.  We were unable to remove a 1cm lower pole calculus due to the acute angle of the lower pole calyx.  we then removed the access sheath under direct vision and noted no injury to the ureter. We then placed a 6 x 26 double-j ureteral stent over the original zip wire.  We then removed the wire and good coil was noted in the the renal pelvis under fluoroscopy and the bladder under direct vision. the bladder was then drained and this concluded the procedure which was well tolerated by patient.  Complications: None  Condition: Stable, extubated, transferred to PACU  Plan: Patient is to be discharged home as to follow-up in one week. She will need a ESWL to fragment her lower pole calculus

## 2021-10-27 NOTE — Anesthesia Postprocedure Evaluation (Signed)
Anesthesia Post Note  Patient: [name]  Procedure(s) Performed: CYSTOSCOPY WITH RETROGRADE PYELOGRAM, URETEROSCOPY AND STENT EXCHANGE (Right: Renal) HOLMIUM LASER APPLICATION (Right: Ureter) STONE EXTRACTION WITH BASKET (Right: Renal)  Patient location during evaluation: PACU Anesthesia Type: General Level of consciousness: awake and alert and oriented Pain management: pain level controlled Vital Signs Assessment: post-procedure vital signs reviewed and stable Respiratory status: spontaneous breathing, nonlabored ventilation and respiratory function stable Cardiovascular status: blood pressure returned to baseline and stable Postop Assessment: no apparent nausea or vomiting Anesthetic complications: no   No notable events documented.   Last Vitals:  Vitals:   10/27/21 1415 10/27/21 1444  BP: (!) 160/66 (!) 158/87  Pulse: 73 69  Resp: 10 16  Temp:    SpO2: 97% 96%    Last Pain:  Vitals:   10/27/21 1444  PainSc: 6                  Coreon Simkins C Darryon Bastin

## 2021-10-27 NOTE — Interval H&P Note (Signed)
History and Physical Interval Note:  10/27/2021 11:16 AM  [name]  has presented today for surgery, with the diagnosis of renal calculi.  The various methods of treatment have been discussed with the patient and family. After consideration of risks, benefits and other options for treatment, the patient has consented to  Procedure(s): CYSTOSCOPY WITH RETROGRADE PYELOGRAM, URETEROSCOPY AND STENT EXCHANGE (Right) HOLMIUM LASER APPLICATION (Right) as a surgical intervention.  The patient's history has been reviewed, patient examined, no change in status, stable for surgery.  I have reviewed the patient's chart and labs.  Questions were answered to the patient's satisfaction.     Nicolette Bang

## 2021-10-27 NOTE — Anesthesia Procedure Notes (Addendum)
Procedure Name: LMA Insertion Date/Time: 10/27/2021 12:03 PM  Performed by: Denese Killings, MDPre-anesthesia Checklist: Emergency Drugs available, Suction available, Patient identified and Patient being monitored Patient Re-evaluated:Patient Re-evaluated prior to induction Oxygen Delivery Method: Circle system utilized Preoxygenation: Pre-oxygenation with 100% oxygen Induction Type: IV induction LMA: LMA inserted LMA Size: 4.0 Tube secured with: Tape Dental Injury: Teeth and Oropharynx as per pre-operative assessment

## 2021-10-27 NOTE — Transfer of Care (Signed)
Immediate Anesthesia Transfer of Care Note  Patient: [name]  Procedure(s) Performed: CYSTOSCOPY WITH RETROGRADE PYELOGRAM, URETEROSCOPY AND STENT EXCHANGE (Right: Renal) HOLMIUM LASER APPLICATION (Right: Ureter)  Patient Location: PACU  Anesthesia Type:General  Level of Consciousness: alert , oriented and drowsy  Airway & Oxygen Therapy: Patient Spontanous Breathing and Patient connected to nasal cannula oxygen  Post-op Assessment: Report given to RN and Post -op Vital signs reviewed and stable  Post vital signs: Reviewed and stable  Last Vitals:  Vitals Value Taken Time  BP 169/71 10/27/21 1337  Temp 36.8 C 10/27/21 1337  Pulse 79 10/27/21 1338  Resp 12 10/27/21 1338  SpO2 94 % 10/27/21 1338  Vitals shown include unvalidated device data.  Last Pain:  Vitals:   10/27/21 1115  PainSc: 0-No pain         Complications: No notable events documented.

## 2021-10-27 NOTE — Anesthesia Preprocedure Evaluation (Addendum)
Anesthesia Evaluation  Patient identified by MRN, date of birth, ID band Patient awake    Reviewed: Allergy & Precautions, NPO status , Patient's Chart, lab work & pertinent test results, reviewed documented beta blocker date and time   Airway Mallampati: III  TM Distance: >3 FB Neck ROM: Full    Dental  (+) Dental Advisory Given, Chipped   Pulmonary asthma , sleep apnea ,    Pulmonary exam normal breath sounds clear to auscultation       Cardiovascular Exercise Tolerance: Good hypertension, Pt. on medications and Pt. on home beta blockers  Rhythm:Regular Rate:Normal + Systolic murmurs 1. Left ventricular ejection fraction, by visual estimation, is 60 to 65%. The left ventricle has normal function. There is mildly increased left ventricular hypertrophy.  2. Elevated left ventricular end-diastolic pressure.  3. Left ventricular diastolic parameters are consistent with Grade I diastolic dysfunction (impaired relaxation).  4. The left ventricle has no regional wall motion abnormalities.  5. Global right ventricle has normal systolic function.The right ventricular size is normal. No increase in right ventricular wall thickness.  6. Left atrial size was normal.  7. Right atrial size was normal.  8. The mitral valve is grossly normal. Mild mitral valve regurgitation.  9. The tricuspid valve is grossly normal. Tricuspid valve regurgitation  is trivial.  10. The aortic valve is tricuspid. Aortic valve regurgitation is not visualized.  11. The pulmonic valve was grossly normal. Pulmonic valve regurgitation is not visualized.  12. The inferior vena cava is normal in size with greater than 50% respiratory variability, suggesting right atrial pressure of 3 mmHg.    Neuro/Psych  Headaches,  Neuromuscular disease CVA (mild memory problems), No Residual Symptoms negative psych ROS   GI/Hepatic Neg liver ROS, GERD  Medicated and  Controlled,  Endo/Other  diabetes, Well Controlled, Type 2, Oral Hypoglycemic Agents  Renal/GU negative Renal ROS     Musculoskeletal negative musculoskeletal ROS (+)   Abdominal   Peds  Hematology   Anesthesia Other Findings   Reproductive/Obstetrics negative OB ROS                            Anesthesia Physical Anesthesia Plan  ASA: 3  Anesthesia Plan: General   Post-op Pain Management: Dilaudid IV   Induction: Intravenous  PONV Risk Score and Plan: 4 or greater and Ondansetron and Dexamethasone  Airway Management Planned: LMA  Additional Equipment:   Intra-op Plan:   Post-operative Plan: Extubation in OR  Informed Consent: I have reviewed the patients History and Physical, chart, labs and discussed the procedure including the risks, benefits and alternatives for the proposed anesthesia with the patient or authorized representative who has indicated his/her understanding and acceptance.     Dental advisory given  Plan Discussed with: CRNA and Surgeon  Anesthesia Plan Comments:        Anesthesia Quick Evaluation

## 2021-11-01 ENCOUNTER — Encounter (HOSPITAL_COMMUNITY): Payer: Self-pay | Admitting: Urology

## 2021-11-02 LAB — CALCULI, WITH PHOTOGRAPH (CLINICAL LAB)
Calcium Oxalate Monohydrate: 90 %
Uric Acid Calculi: 10 %
Weight Calculi: 55 mg

## 2021-11-07 ENCOUNTER — Telehealth: Payer: Self-pay

## 2021-11-07 NOTE — Telephone Encounter (Signed)
Patient called to find out what her next steps are, if she needs another procedure or f/u apt.  Per Dr. Alyson Ingles she needs to be scheduled for litho.  Can you please f/u with patient.

## 2021-11-08 NOTE — Telephone Encounter (Signed)
Please advise on what surgery patient will need

## 2021-11-09 ENCOUNTER — Telehealth: Payer: Self-pay

## 2021-11-09 NOTE — Telephone Encounter (Signed)
Received posting sheet for ESWL from Dr. Alyson Ingles.   I spoke with [name]. We have discussed possible surgery dates and 11/29/2021 was agreed upon by all parties. Patient given information about surgery date, what to expect pre-operatively and post operatively.    We discussed that a pre-op nurse will be calling to set up the pre-op visit that will take place prior to surgery. Informed patient that our office will communicate any additional care to be provided after surgery.    Patients questions or concerns were discussed during our call. Advised to call our office should there be any additional information, questions or concerns that arise. Patient verbalized understanding.

## 2021-11-25 ENCOUNTER — Encounter (HOSPITAL_COMMUNITY)
Admission: RE | Admit: 2021-11-25 | Discharge: 2021-11-25 | Disposition: A | Discharge status: Home / Self Care | Payer: Medicare HMO | Source: Ambulatory Visit | Attending: Urology | Admitting: Urology

## 2021-11-25 NOTE — Pre-Procedure Instructions (Signed)
Attempted pre-op phone call. Left VM on [phone] for her to call us back.

## 2021-11-28 ENCOUNTER — Ambulatory Visit (INDEPENDENT_AMBULATORY_CARE_PROVIDER_SITE_OTHER): Payer: Medicare HMO | Admitting: Physician Assistant

## 2021-11-28 ENCOUNTER — Encounter (HOSPITAL_COMMUNITY): Payer: Self-pay

## 2021-11-28 ENCOUNTER — Other Ambulatory Visit: Payer: Self-pay

## 2021-11-28 DIAGNOSIS — M7989 Other specified soft tissue disorders: Secondary | ICD-10-CM | POA: Diagnosis not present

## 2021-11-28 DIAGNOSIS — N200 Calculus of kidney: Secondary | ICD-10-CM | POA: Diagnosis not present

## 2021-11-28 NOTE — Progress Notes (Signed)
Assessment: 1. Nephrolithiasis  2. Right leg swelling    Plan: Discussed findings and concerns at length with the patient.  Explained that the differential for her lower extremity swelling warmth and redness includes deep vein thrombosis which could be potentially life-threatening.  Recommended emergency department evaluation acutely and patient prefers to follow-up with her primary care provider who will see her tomorrow.  Will postpone her lithotripsy procedure until she has been cleared of DVT and has had her lower extremity swelling evaluated fully.  Again urged the patient to proceed to the emergency department.  Following evaluation of medical clearance, will proceed with rescheduling her next lithotripsy.  Chief Complaint: No chief complaint on file.   HPI: [name] is a 76 y.o. female who presents for continued evaluation of nephrolithiasis and is scheduled for another ESWL tomorrow morning.  The patient presents today to go over the lithotripsy packet and questions nursing as to whether she should be concerned about new onset significant right lower extremity pain and swelling has occurred without injury.  She states symptoms have been ongoing for approximately 1 week without improvement.  She has no history of similar findings in the past.  No history of DVT or clotting disorder.  Patient denies flank pain or gross hematuria at this time.  She has had no fever chills nausea or vomiting.  No extremity numbness or tingling.  Portions of the above documentation were copied from a prior visit for review purposes only.  Allergies: Allergies  Allergen Reactions   Levaquin [Levofloxacin In D5w] Diarrhea   Penicillins Rash    PMH: Past Medical History:  Diagnosis Date   Asthma    Chronic systolic heart failure ([place])    Echo (08/26/13):  Mild LVH. EF 25% to 30%. Diffuse HK. Aortic valve: There was trivial regurgitation. Mitral valve: There was mild regurgitation. Left  atrium: The atrium was mildly dilated.   GERD (gastroesophageal reflux disease)    Headache(784.0)    History of kidney stones    History of nephrolithiasis    Hypertension    NICM (nonischemic cardiomyopathy) ([place])    a. LHC (08/2013):  no CAD   Sleep apnea    does not use machine   Stroke ([facility])    Type II or unspecified type diabetes mellitus without mention of complication, uncontrolled    borderline    PSH: Past Surgical History:  Procedure Laterality Date   ABDOMINAL HYSTERECTOMY     COLONOSCOPY WITH PROPOFOL N/A 07/01/2019   Procedure: COLONOSCOPY WITH PROPOFOL;  Surgeon: Danie Binder, MD;  [facility];  Service: Endoscopy;  Laterality: N/A;  2:15pm - pt will not move up   [place], URETEROSCOPY AND STENT PLACEMENT Left 08/11/2021   Procedure: [place], URETEROSCOPY AND STENT PLACEMENT;  Surgeon: Cleon Gustin, MD;  Location: [facility];  Service: Urology;  Laterality: Left;   CYSTOSCOPY WITH RETROGRADE PYELOGRAM, URETEROSCOPY AND STENT PLACEMENT Left 09/08/2021   Procedure: CYSTOSCOPY WITH RETROGRADE PYELOGRAM, URETEROSCOPY AND STENT EXCHANGE;  Surgeon: Cleon Gustin, MD;  Location: [facility];  Service: Urology;  Laterality: Left;   CYSTOSCOPY WITH RETROGRADE PYELOGRAM, URETEROSCOPY AND STENT PLACEMENT Right 10/06/2021   Procedure: CYSTOSCOPY WITH RETROGRADE PYELOGRAM, URETEROSCOPY AND STENT PLACEMENT;  Surgeon: Cleon Gustin, MD;  Location: [facility];  Service: Urology;  Laterality: Right;   CYSTOSCOPY WITH RETROGRADE PYELOGRAM, URETEROSCOPY AND STENT PLACEMENT Right 10/27/2021   Procedure: CYSTOSCOPY WITH RETROGRADE PYELOGRAM, URETEROSCOPY AND STENT EXCHANGE;  Surgeon: Cleon Gustin, MD;  Location: [facility];  Service: Urology;  Laterality: Right;   EXTRACORPOREAL SHOCK WAVE LITHOTRIPSY Left 05/31/2021   Procedure: EXTRACORPOREAL SHOCK WAVE LITHOTRIPSY (ESWL);  Surgeon: Cleon Gustin, MD;  Location:  [facility];  Service: Urology;  Laterality: Left;   EXTRACORPOREAL SHOCK WAVE LITHOTRIPSY Left 06/28/2021   Procedure: EXTRACORPOREAL SHOCK WAVE LITHOTRIPSY (ESWL);  Surgeon: Cleon Gustin, MD;  Location: [facility];  Service: Urology;  Laterality: Left;   HOLMIUM LASER APPLICATION Left 07/12/1538   Procedure: HOLMIUM LASER APPLICATION;  Surgeon: Cleon Gustin, MD;  Location: [facility];  Service: Urology;  Laterality: Left;   HOLMIUM LASER APPLICATION Left 0/11/6759   Procedure: HOLMIUM LASER APPLICATION;  Surgeon: Cleon Gustin, MD;  Location: [facility];  Service: Urology;  Laterality: Left;   HOLMIUM LASER APPLICATION Right 9/50/9326   Procedure: HOLMIUM LASER APPLICATION;  Surgeon: Cleon Gustin, MD;  Location: [facility];  Service: Urology;  Laterality: Right;   HOLMIUM LASER APPLICATION Right 10/20/4578   Procedure: HOLMIUM LASER APPLICATION;  Surgeon: Cleon Gustin, MD;  Location: [facility];  Service: Urology;  Laterality: Right;   LEFT AND RIGHT HEART CATHETERIZATION WITH CORONARY ANGIOGRAM N/A 09/03/2013   Procedure: LEFT AND RIGHT HEART CATHETERIZATION WITH CORONARY ANGIOGRAM;  Surgeon: [name], MD;  Location: [facility] CATH LAB;  Service: Cardiovascular;  Laterality: N/A;   OOPHORECTOMY     removed 1 ovary and 1 ovary remains.   POLYPECTOMY  07/01/2019   Procedure: POLYPECTOMY;  Surgeon: Danie Binder, MD;  [facility];  Service: Endoscopy;;   STONE EXTRACTION WITH BASKET Left 09/08/2021   Procedure: STONE EXTRACTION WITH BASKET;  Surgeon: Cleon Gustin, MD;  Location: [facility];  Service: Urology;  Laterality: Left;   STONE EXTRACTION WITH BASKET Right 10/27/2021   Procedure: STONE EXTRACTION WITH BASKET;  Surgeon: Cleon Gustin, MD;  Location: [facility];  Service: Urology;  Laterality: Right;   ureteral extraction of kidney stone      SH: Social History   Tobacco Use   Smoking status: Never   Smokeless tobacco: Never  Vaping Use   Vaping Use:  Never used  Substance Use Topics   Alcohol use: Yes    Comment: rare   Drug use: No    ROS: All other review of systems were reviewed and are negative except what is noted above in HPI  PE: LMP 01/22/1981 (LMP Unknown)  GENERAL APPEARANCE:  Well appearing, well developed, well nourished, NAD HEENT:  Atraumatic, normocephalic NECK:  Supple. Trachea midline ABDOMEN:  Soft, non-tender, no masses EXTREMITIES:  Moves all extremities well.  Right lower extremity reveals warmth, mild redness, 2+ pitting edema from distal thigh to her foot.  She is somewhat tender to palpation along the right calf. NEUROLOGIC:  Alert and oriented x 3 MENTAL STATUS:  appropriate BACK:  Non-tender to palpation, No CVAT SKIN:  Warm, dry, and intact   Results: Laboratory Data: Lab Results  Component Value Date   WBC 8.2 05/05/2021   HGB 14.5 05/05/2021   HCT 43.6 05/05/2021   MCV 87 05/05/2021   PLT 268 05/05/2021    Lab Results  Component Value Date   CREATININE 0.84 10/25/2021   Lab Results  Component Value Date   HGBA1C 7.1 (H) 10/25/2021    Urinalysis    Component Value Date/Time   COLORURINE YELLOW 12/16/2013 1120   APPEARANCEUR Cloudy (A) 10/12/2021 1333   LABSPEC 1.013 12/16/2013 1120   PHURINE  6.5 12/16/2013 1120   GLUCOSEU Negative 10/12/2021 1333   HGBUR MODERATE (A) 12/16/2013 1120   BILIRUBINUR Negative 10/12/2021 1333   KETONESUR NEGATIVE 12/16/2013 1120   PROTEINUR 3+ (A) 10/12/2021 1333   PROTEINUR 30 (A) 12/16/2013 1120   UROBILINOGEN negative 07/24/2014 1519   UROBILINOGEN 0.2 12/16/2013 1120   NITRITE Negative 10/12/2021 1333   NITRITE NEGATIVE 12/16/2013 1120   LEUKOCYTESUR 3+ (A) 10/12/2021 1333    Lab Results  Component Value Date   LABMICR Comment 10/12/2021   WBCUA >30 (A) 09/14/2021   LABEPIT 0-10 09/14/2021   MUCUS Present 09/14/2021   BACTERIA Few 09/14/2021    Pertinent Imaging: Results for orders placed during the hospital encounter of  07/13/21  DG Abd 1 View  Narrative CLINICAL DATA:  Post lithotripsy with continued left flank pain.  EXAM: ABDOMEN - 1 VIEW  COMPARISON:  None.  FINDINGS: The bowel gas pattern is normal.  Right renal calculi are unchanged largest measuring 2.4 cm in transverse dimension.  Numerous left renal calculi are again seen measuring up to 1 cm. The majority are unchanged and measuring up to 1 cm. Overall, slightly decreased in size and number.  Osseous structures are stable.  IMPRESSION: 1. Left renal calculi have mildly decreased in size and number. 2. Stable right renal calculi.   Electronically Signed By: Ronney Asters M.D. On: 07/14/2021 17:12  No results found for this or any previous visit.  No results found for this or any previous visit.  No results found for this or any previous visit.  No results found for this or any previous visit.  No results found for this or any previous visit.  Results for orders placed in visit on 05/18/21  CT HEMATURIA WORKUP  Narrative CLINICAL DATA:  Hematuria over the last 3 weeks. History of diabetes and hypertension. Cystitis. Urinary incontinence.  EXAM: CT ABDOMEN AND PELVIS WITHOUT AND WITH CONTRAST  TECHNIQUE: Multidetector CT imaging of the abdomen and pelvis was performed following the standard protocol before and following the bolus administration of intravenous contrast.  RADIATION DOSE REDUCTION: This exam was performed according to the departmental dose-optimization program which includes automated exposure control, adjustment of the mA and/or kV according to patient size and/or use of iterative reconstruction technique.  CONTRAST:  173m OMNIPAQUE IOHEXOL 300 MG/ML  SOLN  COMPARISON:  CT abdomen 10/04/2001  FINDINGS: Lower chest: Mild scarring in the right middle lobe, right lower lobe, and lingula. 4 by 3 mm pleural-based nodule along the left lower lobe on image 61 series 15, not changed from  05/11/2020. Small to moderate-sized hiatal hernia. Mild cardiomegaly. Descending thoracic aortic atherosclerosis.  Hepatobiliary: Contracted gallbladder.  Otherwise unremarkable.  Pancreas: Unremarkable  Spleen: Unremarkable  Adrenals/Urinary Tract: Both adrenal glands appear normal.  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 A branching 2.2 by 1.7 cm collecting system staghorn calculus in the right renal pelvis is associated with lower pole and mid kidney caliectasis  2.5 by 1.8 cm filling defect in the right mid to lower kidney infundibulum and calyx proximal to the stone on image 56 series 16 does not appear to enhance and accordingly favors plan thrombus over tumor. Similar filling defect is observed in much of the anterior kidney collecting system as shown for example on image 37 series 13. Within the blunted calices the presumed clot as outlined by excreted contrast.  There calcifications near the distal ureters but not within the distal ureters them cells. A separate right renal calculus is nonobstructive  and measures 3 mm in diameter on image 34 series 2.  Which like the contralateral side, there is a staghorn calculus in the left mid kidney collecting system abutting the UPJ, measuring 1.7 cm in long axis. Release 5 additional nonobstructive renal calculi are present in the left kidney. There is no overt left hydronephrosis nor do we demonstrate additional casting filling defects in the left kidney. There is some mild left caliectasis particularly in upper pole example on image 32 series 13.  There is abnormal wall thickening in the right proximal ureter extending along 4.3 cm from the stone, possibly from inflammatory wall thickening, less likely to be from thrombus or tumor. Similar appearance on the left extending along 3.4 cm distal to the collecting system calculus in the left proximal ureter with only a thin channel of contrast in this  vicinity.  Urinary bladder unremarkable.  Stomach/Bowel: Hiatal hernia.  Sigmoid colon diverticulosis.  Vascular/Lymphatic: Atherosclerosis is present, including aortoiliac atherosclerotic disease and atherosclerotic calcifications in branches of the abdominal aorta.  Reproductive: Uterus absent.  Adnexa unremarkable.  Other: No supplemental non-categorized findings.  Musculoskeletal: Thoracic and lumbar spondylosis and degenerative disc disease.  IMPRESSION: 1. Bilateral staghorn calculi in the collecting systems extending to the UPJ on both sides. These are associated with abnormal thickening of the proximal ureters bilaterally, as well as mild bilateral hydronephrosis. 2. In the right renal collecting system, the large staghorn calculus is associated with abnormal noncalcified filling defect in anterior infundibula and calices probably from blood clot given the fact that this abnormal density filling the infundibulum and calices is not appreciably enhancing. Difficult to exclude small amounts of tumor in this region given the substantially abnormal appearance. See for example image 37 series 13. 3. Renal atrophy and scarring with renal sinus lipomatosis. 4. Additional nonobstructive renal calculi are present bilaterally. 5. Stable 4 by 3 mm pleural-based nodule in the left lower lobe, not changed over the last year, thought to likely be benign. 6. Mild bibasilar scarring. 7. Other imaging findings of potential clinical significance: Small to moderate-sized hiatal hernia. Mild cardiomegaly. Sigmoid colon diverticulosis. Aortic Atherosclerosis (ICD10-I70.0). Systemic atherosclerosis. Thoracolumbar spondylosis and degenerative disc disease.   Electronically Signed By: Van Clines M.D. On: 05/23/2021 14:17  No results found for this or any previous visit.  No results found for this or any previous visit (from the past 24 hour(s)).

## 2021-11-29 ENCOUNTER — Encounter (HOSPITAL_COMMUNITY): Admission: RE | Payer: Self-pay | Source: Home / Self Care

## 2021-11-29 ENCOUNTER — Ambulatory Visit (INDEPENDENT_AMBULATORY_CARE_PROVIDER_SITE_OTHER): Payer: Medicare HMO | Admitting: Family

## 2021-11-29 ENCOUNTER — Encounter: Payer: Self-pay | Admitting: Family

## 2021-11-29 ENCOUNTER — Telehealth: Payer: Self-pay

## 2021-11-29 ENCOUNTER — Ambulatory Visit (HOSPITAL_COMMUNITY)
Admission: RE | Admit: 2021-11-29 | Discharge: 2021-11-29 | Disposition: A | Discharge status: Home / Self Care | Payer: Medicare HMO | Source: Ambulatory Visit | Attending: Family | Admitting: Family

## 2021-11-29 ENCOUNTER — Ambulatory Visit (HOSPITAL_COMMUNITY): Admission: RE | Admit: 2021-11-29 | Payer: Medicare HMO | Source: Home / Self Care | Admitting: Urology

## 2021-11-29 ENCOUNTER — Other Ambulatory Visit: Payer: Self-pay | Admitting: Family

## 2021-11-29 VITALS — BP 140/82 | HR 47 | Temp 97.8°F | Ht 64.0 in | Wt 151.6 lb

## 2021-11-29 DIAGNOSIS — M7989 Other specified soft tissue disorders: Secondary | ICD-10-CM | POA: Diagnosis not present

## 2021-11-29 DIAGNOSIS — I82431 Acute embolism and thrombosis of right popliteal vein: Secondary | ICD-10-CM | POA: Diagnosis not present

## 2021-11-29 DIAGNOSIS — N200 Calculus of kidney: Secondary | ICD-10-CM

## 2021-11-29 DIAGNOSIS — M79604 Pain in right leg: Secondary | ICD-10-CM | POA: Diagnosis not present

## 2021-11-29 DIAGNOSIS — I824Y1 Acute embolism and thrombosis of unspecified deep veins of right proximal lower extremity: Secondary | ICD-10-CM

## 2021-11-29 DIAGNOSIS — I82411 Acute embolism and thrombosis of right femoral vein: Secondary | ICD-10-CM | POA: Diagnosis not present

## 2021-11-29 SURGERY — LITHOTRIPSY, ESWL
Anesthesia: LOCAL | Laterality: Right

## 2021-11-29 MED ORDER — APIXABAN 5 MG PO TABS: 5.0000 mg | ORAL_TABLET | Freq: Two times a day (BID) | ORAL | 3 refills | Status: DC

## 2021-11-29 MED ORDER — APIXABAN 5 MG PO TABS: ORAL_TABLET | ORAL | 0 refills | Status: DC

## 2021-11-29 MED ORDER — APIXABAN 5 MG PO TABS: 5.0000 mg | ORAL_TABLET | Freq: Two times a day (BID) | ORAL | 0 refills | Status: DC

## 2021-11-29 NOTE — Telephone Encounter (Signed)
ELIQUIS SAMPLES GIVEN AND 30 DAY FREE TRIAL CARD MARKED PA AS URGENT  PATIENT AWARE

## 2021-11-29 NOTE — Telephone Encounter (Signed)
[facility] calling to let us know that patient's insurance requires a prior authorization for the Eliquis and they will send this to [place] to complete.

## 2021-11-29 NOTE — Telephone Encounter (Signed)
  [facility] TO FILL SAMPLE STARTER PACK THEN PATIENT TO CONTINUE ELIQUIS '5MG'$  TWICE DAILY

## 2021-11-29 NOTE — Patient Instructions (Signed)
Deep Vein Thrombosis  Deep vein thrombosis (DVT) is a condition in which a blood clot forms in a vein of the deep venous system. This can occur in the lower leg, thigh, pelvis, arm, or neck. A clot is blood that has thickened into a gel or solid. This condition is serious and can be life-threatening if the clot travels to the arteries of the lungs and causes a blockage (pulmonary embolism). A DVT can also damage veins in the leg, which can lead to long-term venous disease, leg pain, swelling, discoloration, and ulcers or sores (post-thrombotic syndrome). What are the causes? This condition may be caused by: A slowdown of blood flow. Damage to a vein. A condition that causes blood to clot more easily, such as certain bleeding disorders. What increases the risk? The following factors may make you more likely to develop this condition: Obesity. Being older, especially older than age 3. Being inactive or not moving around (sedentary lifestyle). This may include: Sitting or lying down for longer than 4-6 hours other than to sleep at night. Being in the hospital, or having major or lengthy surgery. Having any recent bone injuries, such as breaks (fractures), that reduce movement, especially in the lower extremities. Having recent orthopedic surgery on the lower extremities. Being pregnant, giving birth, or having recently given birth. Taking medicines that contain estrogen, such as birth control or hormone replacement therapy. Using products that contain nicotine or tobacco, especially if you use hormonal birth control. Having a history of a blood vessel disease (peripheral vascular disease) or congestive heart disease. Having a history of cancer, especially if being treated with chemotherapy. What are the signs or symptoms? Symptoms of this condition include: Swelling, pain, pressure, or tenderness in an arm or a leg. An arm or a leg becoming warm, red, or discolored. A leg turning very pale or  blue. You may have a large DVT. This is rare. If the clot is in your leg, you may notice that symptoms get worse when you stand or walk. In some cases, there are no symptoms. How is this diagnosed? This condition is diagnosed with: Your medical history and a physical exam. Tests, such as: Blood tests to check how well your blood clots. Doppler ultrasound. This is the best way to find a DVT. CT venogram. Contrast dye is injected into a vein, and X-rays are taken to check for clots. This is helpful for veins in the chest or pelvis. How is this treated? Treatment for this condition depends on: The cause of your DVT. The size and location of your DVT, or having more than one DVT. Your risk for bleeding or developing more clots. Other medical conditions you may have. Treatment may include: Taking a blood thinner medicine (anticoagulant) to prevent more clots from forming or current clots from growing. Wearing compression stockings. Injecting medicines into the affected vein to break up the clot (catheter-directed thrombolysis). Surgical procedures, when DVT is severe or hard to treat. These may be done to: Isolate and remove your clot. Place an inferior vena cava (IVC) filter. This filter is placed into a large vein called the inferior vena cava to catch blood clots before they reach your lungs. You may get some medical treatments for 6 months or longer. Follow these instructions at home: If you are taking blood thinners: Talk with your health care provider before you take any medicines that contain aspirin or NSAIDs, such as ibuprofen. These medicines increase your risk for dangerous bleeding. Take your medicine exactly  as told, at the same time every day. Do not skip a dose. Do not take more than the prescribed dose. This is important. Ask your health care provider about foods and medicines that could change or interact with the way your blood thinner works. Avoid these foods and medicines  if you are told to do so. Avoid anything that may cause bleeding or bruising. You may bleed more easily while taking blood thinners. Be very careful when using knives, scissors, or other sharp objects. Use an electric razor instead of a blade. Avoid activities that could cause injury or bruising, and follow instructions for preventing falls. Tell your health care provider if you have had any internal bleeding, bleeding ulcers, or neurologic diseases, such as strokes or cerebral aneurysms. Wear a medical alert bracelet or carry a card that lists what medicines you take. General instructions Take over-the-counter and prescription medicines only as told by your health care provider. Return to your normal activities as told by your health care provider. Ask your health care provider what activities are safe for you. If recommended, wear compression stockings as told by your health care provider. These stockings help to prevent blood clots and reduce swelling in your legs. Never wear your compression stockings while sleeping at night. Keep all follow-up visits. This is important. Where to find more information American Heart Association: www.heart.org Centers for Disease Control and Prevention: http://www.wolf.info/ National Heart, Lung, and Blood Institute: https://wilson-eaton.com/ Contact a health care provider if: You miss a dose of your blood thinner. You have unusual bruising or other color changes. You have new or worse pain, swelling, or redness in an arm or a leg. You have worsening numbness or tingling in an arm or a leg. You have a significant color change (pale or blue) in the extremity that has the DVT. Get help right away if: You have signs or symptoms that a blood clot has moved to the lungs. These may include: Shortness of breath. Chest pain. Fast or irregular heartbeats (palpitations). Light-headedness, dizziness, or fainting. Coughing up blood. You have signs or symptoms that your blood is  too thin. These may include: Blood in your vomit, stool, or urine. A cut that will not stop bleeding. A menstrual period that is heavier than usual. A severe headache or confusion. These symptoms may be an emergency. Get help right away. Call 911. Do not wait to see if the symptoms will go away. Do not drive yourself to the hospital. Summary Deep vein thrombosis (DVT) happens when a blood clot forms in a deep vein. This may occur in the lower leg, thigh, pelvis, arm, or neck. Symptoms affect the arm or leg and can include swelling, pain, tenderness, warmth, redness, or discoloration. This condition may be treated with medicines. In severe cases, a procedure or surgery may be done to remove or dissolve the clots. If you are taking blood thinners, take them exactly as told. Do not skip a dose. Do not take more than is prescribed. Get help right away if you have a severe headache, shortness of breath, chest pain, fast or irregular heartbeats, or blood in your vomit, urine, or stool. This information is not intended to replace advice given to you by your health care provider. Make sure you discuss any questions you have with your health care provider. Document Revised: 10/18/2020 Document Reviewed: 10/18/2020 Elsevier Patient Education  [place].

## 2021-11-29 NOTE — Telephone Encounter (Signed)
Stacy [place]: [id number] help? Call us at [phone] Outcome Additional Information Required The patient currently has access to the requested medication and a Prior Authorization is not needed for the patient/medication. Drug Eliquis '5MG'$  tablets Form [employer] Electronic PA Form [phone] NCPDP)

## 2021-11-29 NOTE — Addendum Note (Signed)
Addended by: Lottie Dawson D on: 11/29/2021 03:14 PM   Modules accepted: Orders

## 2021-11-29 NOTE — Telephone Encounter (Signed)
Radiology from [facility] called informing that pt does have a right leg DVT in femoral and popliteal with right calf edema and a Baker's cyst.    Christy made aware. She has sent Eliquis to [facility] in [place]. Pt is aware and will come by office today to pick up rebate card. Per Alyse Low pt should go to ED if she becomes SOB at all. Pt made aware of this as well.

## 2021-11-29 NOTE — Telephone Encounter (Signed)
Don't have samples they got given away Went to [facility] she is going to work PA right away

## 2021-11-29 NOTE — Telephone Encounter (Signed)
Please complete the PA, and give patient samples. She need Eliquis 10 mg BID for 7 days, then 5 mg BID.

## 2021-11-29 NOTE — Progress Notes (Signed)
   Subjective:    Patient ID: [name], female    DOB: 10-Mar-1946, 76 y.o.   MRN: [id number]  Chief Complaint  Patient presents with   Edema    Right leg x 1 week pain full    Breathing Problem    That happened a couple of days ago     Breathing Problem She complains of difficulty breathing.   Pt presents to the office today with right leg swelling and pain. States she told her Urologists about this and was told she needed to follow up with PCP.   States she noticed her leg swelling, increase pain, and warmth for the last week.   Denies any SOB, fever, or chills.   She has not been as active recently because of kidney stones.   Reports her pain is achy pain of 7 out 10. She has tried to keep elevated with no relief.   Review of Systems  Cardiovascular:  Positive for leg swelling.  All other systems reviewed and are negative.      Objective:   Physical Exam Vitals reviewed.  Constitutional:      General: She is not in acute distress.    Appearance: She is well-developed.  HENT:     Head: Normocephalic and atraumatic.  Eyes:     Pupils: Pupils are equal, round, and reactive to light.  Neck:     Thyroid: No thyromegaly.  Cardiovascular:     Rate and Rhythm: Normal rate and regular rhythm.     Heart sounds: Normal heart sounds. No murmur heard. Pulmonary:     Effort: Pulmonary effort is normal. No respiratory distress.     Breath sounds: Normal breath sounds. No wheezing.  Abdominal:     General: Bowel sounds are normal. There is no distension.     Palpations: Abdomen is soft.     Tenderness: There is no abdominal tenderness.  Musculoskeletal:        General: Swelling and tenderness present.     Cervical back: Normal range of motion and neck supple.     Comments: Right leg swelling, erythemas, and warmth  Skin:    General: Skin is warm and dry.  Neurological:     Mental Status: She is alert and oriented to person, place, and time.     Cranial Nerves: No  cranial nerve deficit.     Deep Tendon Reflexes: Reflexes are normal and symmetric.  Psychiatric:        Behavior: Behavior normal.        Thought Content: Thought content normal.        Judgment: Judgment normal.      BP (!) 140/82   Pulse (!) 47   Temp 97.8 F (36.6 C) (Temporal)   Ht '5\' 4"'$  (1.626 m)   Wt 151 lb 9.6 oz (68.8 kg)   LMP 01/22/1981 (LMP Unknown)   SpO2 99%   BMI 26.02 kg/m       Assessment & Plan:   [name] comes in today with chief complaint of Edema (Right leg x 1 week pain full ) and Breathing Problem (That happened a couple of days ago )   Diagnosis and orders addressed:  1. Right leg swelling - US Venous Img Lower Unilateral Right; Future  2. Right leg pain - US Venous Img Lower Unilateral Right; Future   Stat US ordered Will send patient today If negative will treat as cellulitis    Evelina Dun, FNP

## 2021-11-30 ENCOUNTER — Telehealth: Payer: Self-pay

## 2021-11-30 DIAGNOSIS — M7989 Other specified soft tissue disorders: Secondary | ICD-10-CM

## 2021-11-30 DIAGNOSIS — N200 Calculus of kidney: Secondary | ICD-10-CM

## 2021-11-30 NOTE — Telephone Encounter (Signed)
Patient called and left voicemail concerning rescheduling her ESWL.  Sent to MD to advise

## 2021-11-30 NOTE — Telephone Encounter (Signed)
Reviewed with Dr. Alyson Ingles, order CT stone study stat to determine treatment course.  Patient called and notified.

## 2021-12-02 ENCOUNTER — Ambulatory Visit (HOSPITAL_COMMUNITY)
Admission: RE | Admit: 2021-12-02 | Discharge: 2021-12-02 | Disposition: A | Discharge status: Home / Self Care | Payer: Medicare HMO | Source: Ambulatory Visit | Attending: Urology | Admitting: Urology

## 2021-12-02 DIAGNOSIS — K5730 Diverticulosis of large intestine without perforation or abscess without bleeding: Secondary | ICD-10-CM | POA: Diagnosis not present

## 2021-12-02 DIAGNOSIS — K8689 Other specified diseases of pancreas: Secondary | ICD-10-CM | POA: Diagnosis not present

## 2021-12-02 DIAGNOSIS — K449 Diaphragmatic hernia without obstruction or gangrene: Secondary | ICD-10-CM | POA: Diagnosis not present

## 2021-12-02 DIAGNOSIS — N200 Calculus of kidney: Secondary | ICD-10-CM | POA: Insufficient documentation

## 2021-12-05 ENCOUNTER — Telehealth: Payer: Self-pay

## 2021-12-05 NOTE — Telephone Encounter (Signed)
Patient ware of results and scheduled for next available Cysto on 09/01 with MD.

## 2021-12-05 NOTE — Telephone Encounter (Signed)
-----   Message from Cleon Gustin, MD sent at 12/05/2021  4:42 PM EDT ----- She has some calculi in her kidney and 1 in her ureter. We can pull her stent ----- Message ----- From: Audie Box, CMA Sent: 12/02/2021  12:25 PM EDT To: Cleon Gustin, MD  F/u on 09/06 with Sharee Pimple.

## 2021-12-07 ENCOUNTER — Telehealth: Payer: Self-pay | Admitting: Cardiology

## 2021-12-07 NOTE — Telephone Encounter (Signed)
Spoke to patient she stated she saw her PCP for a blood clot in her right lower leg.She was prescribed Eliquis.She wanted to make sure Dr.Hochrein agreed.Advised she should take Eliquis as prescribed.I will make Dr.Hochrein aware.

## 2021-12-07 NOTE — Telephone Encounter (Signed)
New Message;    Patient says she would like for the nurse to call her . She says she was told that she has a blood clot in her right leg.. Need to know what to do about her blood thinner.

## 2021-12-07 NOTE — Telephone Encounter (Signed)
Returned call to patient left message on personal voice mail to call back. 

## 2021-12-09 ENCOUNTER — Ambulatory Visit (INDEPENDENT_AMBULATORY_CARE_PROVIDER_SITE_OTHER): Payer: Medicare HMO | Admitting: Urology

## 2021-12-09 ENCOUNTER — Encounter: Payer: Self-pay | Admitting: Urology

## 2021-12-09 VITALS — BP 151/71 | HR 56

## 2021-12-09 DIAGNOSIS — N200 Calculus of kidney: Secondary | ICD-10-CM

## 2021-12-09 LAB — URINALYSIS, ROUTINE W REFLEX MICROSCOPIC
Bilirubin, UA: NEGATIVE
Ketones, UA: NEGATIVE
Nitrite, UA: NEGATIVE
Specific Gravity, UA: 1.02 (ref 1.005–1.030)
Urobilinogen, Ur: 0.2 mg/dL (ref 0.2–1.0)
pH, UA: 6 (ref 5.0–7.5)

## 2021-12-09 LAB — MICROSCOPIC EXAMINATION
RBC, Urine: >30 /hpf — AB (ref 0–2)
Renal Epithel, UA: NONE SEEN /hpf
WBC, UA: >30 /hpf — AB (ref 0–5)

## 2021-12-09 MED ORDER — SULFAMETHOXAZOLE-TRIMETHOPRIM 800-160 MG PO TABS: 1.0000 | ORAL_TABLET | Freq: Once | ORAL | 0 refills | Status: AC

## 2021-12-09 NOTE — Patient Instructions (Signed)

## 2021-12-09 NOTE — Progress Notes (Signed)
   12/09/21  CC: followup nephrolithiasis   HPI: Mr [name] is a 76yo here for right ureteral stent removal Blood pressure (!) 151/71, pulse (!) 56, last menstrual period 01/22/1981. NED. A&Ox3.   No respiratory distress   Abd soft, NT, ND Normal external genitalia with patent urethral meatus  Cystoscopy Procedure Note  Patient identification was confirmed, informed consent was obtained, and patient was prepped using Betadine solution.  Lidocaine jelly was administered per urethral meatus.    Procedure: - Flexible cystoscope introduced, without any difficulty.   - Thorough search of the bladder revealed:    normal urethral meatus    normal urothelium    no stones    no ulcers     no tumors    no urethral polyps    no trabeculation  - Ureteral orifices were normal in position and appearance. -Using a grasper the right ureteral stent was removed intact  Post-Procedure: - Patient tolerated the procedure well  Assessment/ Plan:    No follow-ups on file.  Nicolette Bang, MD

## 2021-12-14 ENCOUNTER — Ambulatory Visit: Payer: Medicare HMO | Admitting: Physician Assistant

## 2021-12-30 ENCOUNTER — Telehealth: Payer: Self-pay | Admitting: Family

## 2021-12-30 NOTE — Telephone Encounter (Signed)
Patient calling to see if we can give her more samples of apixaban (ELIQUIS) 5 MG TABS tablet or if it can be called in at the pharmacy. Please call back to advise.

## 2021-12-30 NOTE — Telephone Encounter (Signed)
Samples up front patient aware by vm

## 2022-01-17 ENCOUNTER — Telehealth: Payer: Self-pay | Admitting: Family

## 2022-01-17 NOTE — Telephone Encounter (Signed)
LMTCB

## 2022-01-17 NOTE — Telephone Encounter (Signed)
Patient is supposed to have surgery to get kidney stones removed and she wants to talk to someone about if she needs to continue taking apixaban (ELIQUIS) 5 MG TABS tablet. Please call back and advise.

## 2022-01-17 NOTE — Telephone Encounter (Signed)
She should stop this three days prior to surgery.

## 2022-01-18 NOTE — Telephone Encounter (Signed)
Left detailed message.   

## 2022-01-20 ENCOUNTER — Telehealth: Payer: Self-pay

## 2022-01-20 ENCOUNTER — Other Ambulatory Visit: Payer: Self-pay

## 2022-01-20 ENCOUNTER — Ambulatory Visit: Payer: Medicare HMO | Admitting: Urology

## 2022-01-20 DIAGNOSIS — N200 Calculus of kidney: Secondary | ICD-10-CM

## 2022-01-20 NOTE — Telephone Encounter (Signed)
Made patient aware that we will need to cancel her appt today until she gets her renal U/S done. Made patient aware to call  [facility] to schedule her renal U/S. Patient voiced understanding.

## 2022-01-25 ENCOUNTER — Ambulatory Visit (HOSPITAL_COMMUNITY)
Admission: RE | Admit: 2022-01-25 | Discharge: 2022-01-25 | Disposition: A | Discharge status: Home / Self Care | Payer: Medicare HMO | Source: Ambulatory Visit | Attending: Urology | Admitting: Urology

## 2022-01-25 DIAGNOSIS — N281 Cyst of kidney, acquired: Secondary | ICD-10-CM | POA: Diagnosis not present

## 2022-01-25 DIAGNOSIS — N200 Calculus of kidney: Secondary | ICD-10-CM | POA: Insufficient documentation

## 2022-01-28 ENCOUNTER — Other Ambulatory Visit: Payer: Self-pay | Admitting: Cardiology

## 2022-02-16 ENCOUNTER — Telehealth: Payer: Self-pay

## 2022-02-16 NOTE — Telephone Encounter (Signed)
Patient called advising that since she seen Evelina Dun, [place] she was cleared to stop her medication 3 days prior to her Lithotripsy. I advised her I would make you aware and request the clearance from her provider. Once I receive it I will scan it in and forward it over.

## 2022-02-16 NOTE — Telephone Encounter (Signed)
Clearance letter sent to [facility].

## 2022-02-22 ENCOUNTER — Telehealth: Payer: Self-pay | Admitting: Family

## 2022-02-22 NOTE — Telephone Encounter (Signed)
Patient calling to see if we have any Eliquis samples. Please call back and advise

## 2022-02-22 NOTE — Telephone Encounter (Signed)
No samples at this time left detailed message.

## 2022-03-15 ENCOUNTER — Telehealth: Payer: Self-pay | Admitting: Cardiology

## 2022-03-15 DIAGNOSIS — M7989 Other specified soft tissue disorders: Secondary | ICD-10-CM | POA: Insufficient documentation

## 2022-03-15 NOTE — Telephone Encounter (Signed)
Pt c/o swelling: STAT is pt has developed SOB within 24 hours  If swelling, where is the swelling located? Right leg swollen for about a month, patient states at times it hurts.   How much weight have you gained and in what time span? No, hasn't gained weight   Have you gained 3 pounds in a day or 5 pounds in a week?   Do you have a log of your daily weights (if so, list)?   Are you currently taking a fluid pill? no  Are you currently SOB? no  Have you traveled recently? no   Patient states she was Dx with a blood clot on her right leg a few months ago, and was put on Eliquis.

## 2022-03-15 NOTE — Telephone Encounter (Signed)
Patient reported H/O DVT rt leg with increasing edema and pain 7-8/10 that comes and goes in spurts; more around the knee. She thinks it may be arthritis. Appointment made with Dr. Percival Spanish for 12/7. Concern is increasing edema of rt leg.

## 2022-03-15 NOTE — Progress Notes (Unsigned)
Cardiology Office Note   Date:  03/16/2022   ID:  [name], DOB 04-Oct-1945, MRN [id number]  PCP:  Sharion Balloon, FNP  Cardiologist:   Minus Breeding, MD  Chief Complaint  Patient presents with   Edema      History of Present Illness: [name] is a 76 y.o. female who presents  for ongoing assessment and management of hypertension, chronic dyspnea, nonischemic cardiomyopathy, with probable OSA.   She called the other day with leg swelling.  She was found to have DVT.  This was in the right popliteal and femoral veins.  This was in August.  She has since been on Eliquis.  She continues to have some lower extremity swelling and little discomfort.  She has not had any shortness of breath, PND or orthopnea.  She does not have any palpitations, presyncope or syncope.  She has had no weight gain.   Past Medical History:  Diagnosis Date   Asthma    Chronic systolic heart failure ([place])    Echo (08/26/13):  Mild LVH. EF 25% to 30%. Diffuse HK. Aortic valve: There was trivial regurgitation. Mitral valve: There was mild regurgitation. Left atrium: The atrium was mildly dilated.   GERD (gastroesophageal reflux disease)    Headache(784.0)    History of kidney stones    History of nephrolithiasis    Hypertension    NICM (nonischemic cardiomyopathy) ([place])    a. LHC (08/2013):  no CAD   Sleep apnea    does not use machine   Stroke ([place])    Type II or unspecified type diabetes mellitus without mention of complication, uncontrolled    borderline    Past Surgical History:  Procedure Laterality Date   ABDOMINAL HYSTERECTOMY     COLONOSCOPY WITH PROPOFOL N/A 07/01/2019   Procedure: COLONOSCOPY WITH PROPOFOL;  Surgeon: Danie Binder, MD;  [facility];  Service: Endoscopy;  Laterality: N/A;  2:15pm - pt will not move up   [place], URETEROSCOPY AND STENT PLACEMENT Left 08/11/2021   Procedure: [place],  URETEROSCOPY AND STENT PLACEMENT;  Surgeon: Cleon Gustin, MD;  Location: [facility];  Service: Urology;  Laterality: Left;   CYSTOSCOPY WITH RETROGRADE PYELOGRAM, URETEROSCOPY AND STENT PLACEMENT Left 09/08/2021   Procedure: CYSTOSCOPY WITH RETROGRADE PYELOGRAM, URETEROSCOPY AND STENT EXCHANGE;  Surgeon: Cleon Gustin, MD;  Location: [facility];  Service: Urology;  Laterality: Left;   CYSTOSCOPY WITH RETROGRADE PYELOGRAM, URETEROSCOPY AND STENT PLACEMENT Right 10/06/2021   Procedure: CYSTOSCOPY WITH RETROGRADE PYELOGRAM, URETEROSCOPY AND STENT PLACEMENT;  Surgeon: Cleon Gustin, MD;  Location: [facility];  Service: Urology;  Laterality: Right;   CYSTOSCOPY WITH RETROGRADE PYELOGRAM, URETEROSCOPY AND STENT PLACEMENT Right 10/27/2021   Procedure: CYSTOSCOPY WITH RETROGRADE PYELOGRAM, URETEROSCOPY AND STENT EXCHANGE;  Surgeon: Cleon Gustin, MD;  Location: [facility];  Service: Urology;  Laterality: Right;   EXTRACORPOREAL SHOCK WAVE LITHOTRIPSY Left 05/31/2021   Procedure: EXTRACORPOREAL SHOCK WAVE LITHOTRIPSY (ESWL);  Surgeon: Cleon Gustin, MD;  Location: [facility];  Service: Urology;  Laterality: Left;   EXTRACORPOREAL SHOCK WAVE LITHOTRIPSY Left 06/28/2021   Procedure: EXTRACORPOREAL SHOCK WAVE LITHOTRIPSY (ESWL);  Surgeon: Cleon Gustin, MD;  Location: [facility];  Service: Urology;  Laterality: Left;   HOLMIUM LASER APPLICATION Left 04/17/4079   Procedure: HOLMIUM LASER APPLICATION;  Surgeon: Cleon Gustin, MD;  Location: [facility];  Service: Urology;  Laterality: Left;   HOLMIUM LASER APPLICATION Left 07/13/8183  Procedure: HOLMIUM LASER APPLICATION;  Surgeon: Cleon Gustin, MD;  Location: [facility];  Service: Urology;  Laterality: Left;   HOLMIUM LASER APPLICATION Right 0/81/4481   Procedure: HOLMIUM LASER APPLICATION;  Surgeon: Cleon Gustin, MD;  Location: [facility];  Service: Urology;  Laterality: Right;   HOLMIUM LASER APPLICATION Right 8/56/3149   Procedure: HOLMIUM LASER  APPLICATION;  Surgeon: Cleon Gustin, MD;  Location: [facility];  Service: Urology;  Laterality: Right;   LEFT AND RIGHT HEART CATHETERIZATION WITH CORONARY ANGIOGRAM N/A 09/03/2013   Procedure: LEFT AND RIGHT HEART CATHETERIZATION WITH CORONARY ANGIOGRAM;  Surgeon: [name], MD;  Location: [facility] CATH LAB;  Service: Cardiovascular;  Laterality: N/A;   OOPHORECTOMY     removed 1 ovary and 1 ovary remains.   POLYPECTOMY  07/01/2019   Procedure: POLYPECTOMY;  Surgeon: Danie Binder, MD;  [facility];  Service: Endoscopy;;   STONE EXTRACTION WITH BASKET Left 09/08/2021   Procedure: STONE EXTRACTION WITH BASKET;  Surgeon: Cleon Gustin, MD;  Location: [facility];  Service: Urology;  Laterality: Left;   STONE EXTRACTION WITH BASKET Right 10/27/2021   Procedure: STONE EXTRACTION WITH BASKET;  Surgeon: Cleon Gustin, MD;  Location: [facility];  Service: Urology;  Laterality: Right;   ureteral extraction of kidney stone       Current Outpatient Medications  Medication Sig Dispense Refill   amLODipine (NORVASC) 10 MG tablet Take 1 tablet (10 mg total) by mouth daily. 90 tablet 2   apixaban (ELIQUIS) 5 MG TABS tablet Take 2 tablets ('10mg'$ ) twice daily for 7 days, then 1 tablet ('5mg'$ ) twice daily. FOR NEW DVT 74 tablet 0   apixaban (ELIQUIS) 5 MG TABS tablet Take 1 tablet (5 mg total) by mouth 2 (two) times daily. START AFTER STARTER PACK 60 tablet 3   bisoprolol (ZEBETA) 10 MG tablet Take 1 tablet (10 mg total) by mouth daily. 90 tablet 1   rosuvastatin (CRESTOR) 10 MG tablet Take 1 tablet (10 mg total) by mouth daily. 90 tablet 3   acetaminophen (TYLENOL) 500 MG tablet Take 1,000 mg by mouth every 6 (six) hours as needed for moderate pain or headache. (Patient not taking: Reported on 03/16/2022)     albuterol (VENTOLIN HFA) 108 (90 Base) MCG/ACT inhaler Inhale 2 puffs into the lungs every 6 (six) hours as needed for wheezing or shortness of breath. (Patient not taking: Reported on  03/16/2022) 18 g 0   alendronate (FOSAMAX) 70 MG tablet Take 1 tablet (70 mg total) by mouth every 7 (seven) days. Take with a full glass of water on an empty stomach. (Patient not taking: Reported on 03/16/2022) 4 tablet 11   diclofenac (VOLTAREN) 75 MG EC tablet Take 1 tablet (75 mg total) by mouth 2 (two) times daily. (Patient not taking: Reported on 03/16/2022) 30 tablet 0   diclofenac Sodium (VOLTAREN) 1 % GEL Apply 2 g topically 4 (four) times daily. (Patient not taking: Reported on 03/16/2022) 150 g 1   ENTRESTO 97-103 MG Take 1 tablet by mouth twice daily (Patient not taking: Reported on 03/16/2022) 90 tablet 0   glucose blood (ONETOUCH VERIO) test strip Use as instructed (Patient not taking: Reported on 03/16/2022) 100 each 12   lisinopril (ZESTRIL) 10 MG tablet Take 10 mg by mouth daily. (Patient not taking: Reported on 03/16/2022)     ONETOUCH DELICA LANCETS FINE MISC Use to check BG once daily (Patient not taking: Reported on 03/16/2022) 100 each 2  oxybutynin (DITROPAN XL) 10 MG 24 hr tablet Take 1 tablet (10 mg total) by mouth daily. (Patient not taking: Reported on 03/16/2022) 90 tablet 1   oxyCODONE-acetaminophen (PERCOCET) 5-325 MG tablet Take 1 tablet by mouth every 4 (four) hours as needed for severe pain. (Patient not taking: Reported on 03/16/2022) 30 tablet 0   tamsulosin (FLOMAX) 0.4 MG CAPS capsule Take 1 capsule (0.4 mg total) by mouth daily after supper. (Patient not taking: Reported on 03/16/2022) 30 capsule 1   Vibegron (GEMTESA) 75 MG TABS Take 1 capsule by mouth daily. (Patient not taking: Reported on 03/16/2022) 30 tablet 0   No current facility-administered medications for this visit.    Allergies:   Levaquin [levofloxacin in d5w] and Penicillins    ROS:  Please see the history of present illness.   Otherwise, review of systems are positive for nephrolithiasis, fatigue.   All other systems are reviewed and negative.    PHYSICAL EXAM: VS:  BP (!) 140/80   Pulse 72   Ht 5'  4" (1.626 m)   Wt 158 lb 6.4 oz (71.8 kg)   LMP 01/22/1981 (LMP Unknown)   SpO2 98%   BMI 27.19 kg/m  , BMI Body mass index is 27.19 kg/m. GENERAL:  Well appearing NECK:  No jugular venous distention, waveform within normal limits, carotid upstroke brisk and symmetric, no bruits, no thyromegaly LUNGS:  Clear to auscultation bilaterally CHEST:  Unremarkable HEART:  PMI not displaced or sustained,S1 and S2 within normal limits, no S3, no S4, no clicks, no rubs, no murmurs ABD:  Flat, positive bowel sounds normal in frequency in pitch, no bruits, no rebound, no guarding, no midline pulsatile mass, no hepatomegaly, no splenomegaly EXT:  2 plus pulses throughout, no edema, no cyanosis no clubbing  EKG:  EKG is  ordered today. 08/11/2021 sinus rhythm, rate 72, left axis deviation, left anterior fascicular block, anterolateral T wave inversions have improved and nonspecific ST-T wave changes not present.   Recent Labs: 05/05/2021: ALT 12; Hemoglobin 14.5; Platelets 268; TSH 1.280 10/25/2021: BUN 20; Creatinine, Ser 0.84; Potassium 3.5; Sodium 144    Lipid Panel    Component Value Date/Time   CHOL 208 (H) 05/05/2021 1526   TRIG 240 (H) 05/05/2021 1526   HDL 40 05/05/2021 1526   CHOLHDL 5.2 (H) 05/05/2021 1526   CHOLHDL 4.6 12/17/2013 0353   VLDL 69 (H) 12/17/2013 0353   LDLCALC 125 (H) 05/05/2021 1526      Wt Readings from Last 3 Encounters:  03/16/22 158 lb 6.4 oz (71.8 kg)  11/29/21 151 lb 9.6 oz (68.8 kg)  11/28/21 149 lb 14.6 oz (68 kg)      Other studies Reviewed: Additional studies/ records that were reviewed today include: Ultrasound Review of the above records demonstrates:  Please see elsewhere in the note.     ASSESSMENT AND PLAN:  Hypertension:   Blood pressure is controlled.  No change in therapy.  Nonischemic cardiomyopathy:   EF improved from 30 to 65%.    DVT: This seems to be unprovoked.  I would given the extent of this and ongoing swelling continue the  full dose anticoagulation for at least 6 months.  I would like to see her back in February and might even consider rescanning.  As unprovoked she would need maintenance therapy.  Aortic atherosclerosis:.  We will pursue risk reduction.  This was noted incidentally on CT.   Current medicines are reviewed at length with the patient today.  The patient  does not have concerns regarding medicines.  The following changes have been made:    Labs/ tests ordered today include:   Orders Placed This Encounter  Procedures   EKG 12-Lead     Disposition:   FU with me in 3 months.     Signed, Minus Breeding, MD  03/16/2022 4:04 PM    [facility]

## 2022-03-16 ENCOUNTER — Ambulatory Visit: Payer: Medicare HMO | Attending: Cardiology | Admitting: Cardiology

## 2022-03-16 ENCOUNTER — Encounter: Payer: Self-pay | Admitting: Cardiology

## 2022-03-16 VITALS — BP 140/80 | HR 72 | Ht 64.0 in | Wt 158.4 lb

## 2022-03-16 DIAGNOSIS — I10 Essential (primary) hypertension: Secondary | ICD-10-CM

## 2022-03-16 DIAGNOSIS — M7989 Other specified soft tissue disorders: Secondary | ICD-10-CM

## 2022-03-16 DIAGNOSIS — I428 Other cardiomyopathies: Secondary | ICD-10-CM

## 2022-03-16 NOTE — Patient Instructions (Addendum)
Medication Instructions:  Your physician recommends that you continue on your current medications as directed. Please refer to the Current Medication list given to you today.  *If you need a refill on your cardiac medications before your next appointment, please call your pharmacy*   Lab Work: NONE If you have labs (blood work) drawn today and your tests are completely normal, you will receive your results only by: [place] (if you have MyChart) OR A paper copy in the mail If you have any lab test that is abnormal or we need to change your treatment, we will call you to review the results.   Testing/Procedures: NONE   Follow-Up: At [facility], you and your health needs are our priority.  As part of our continuing mission to provide you with exceptional heart care, we have created designated Provider Care Teams.  These Care Teams include your primary Cardiologist (physician) and Advanced Practice Providers (APPs -  Physician Assistants and Nurse Practitioners) who all work together to provide you with the care you need, when you need it.  We recommend signing up for the patient portal called "MyChart".  Sign up information is provided on this After Visit Summary.  MyChart is used to connect with patients for Virtual Visits (Telemedicine).  Patients are able to view lab/test results, encounter notes, upcoming appointments, etc.  Non-urgent messages can be sent to your provider as well.   To learn more about what you can do with MyChart, go to [email or web address].    Your next appointment:   2 month(s) in [place]   The format for your next appointment:   In Person  Provider:   Minus Breeding, MD

## 2022-03-29 ENCOUNTER — Other Ambulatory Visit: Payer: Self-pay | Admitting: Cardiology

## 2022-04-27 ENCOUNTER — Other Ambulatory Visit: Payer: Self-pay | Admitting: Cardiology

## 2022-06-04 NOTE — Progress Notes (Deleted)
Cardiology Office Note   Date:  06/04/2022   ID:  Felicia, Newton 05/11/1945, MRN [id number]  PCP:  Felicia Balloon, FNP  Cardiologist:   Felicia Breeding, MD  No chief complaint on file.     History of Present Illness: [name] is a 77 y.o. female who presents  for ongoing assessment and management of hypertension, chronic dyspnea, nonischemic cardiomyopathy, with probable OSA.    She has recently been treated for DVT.   ***   *** She called the other day with leg swelling.  She was found to have DVT.  This was in the right popliteal and femoral veins.  This was in August.  She has since been on Eliquis.  She continues to have some lower extremity swelling and little discomfort.  She has not had any shortness of breath, PND or orthopnea.  She does not have any palpitations, presyncope or syncope.  She has had no weight gain.   Past Medical History:  Diagnosis Date   Asthma    Chronic systolic heart failure ([place])    Echo (08/26/13):  Mild LVH. EF 25% to 30%. Diffuse HK. Aortic valve: There was trivial regurgitation. Mitral valve: There was mild regurgitation. Left atrium: The atrium was mildly dilated.   GERD (gastroesophageal reflux disease)    Headache(784.0)    History of kidney stones    History of nephrolithiasis    Hypertension    NICM (nonischemic cardiomyopathy) ([name])    a. LHC (08/2013):  no CAD   Sleep apnea    does not use machine   Stroke ([name])    Type II or unspecified type diabetes mellitus without mention of complication, uncontrolled    borderline    Past Surgical History:  Procedure Laterality Date   ABDOMINAL HYSTERECTOMY     COLONOSCOPY WITH PROPOFOL N/A 07/01/2019   Procedure: COLONOSCOPY WITH PROPOFOL;  Surgeon: Felicia Binder, MD;  [facility];  Service: Endoscopy;  Laterality: N/A;  2:15pm - pt will not move up   [place], URETEROSCOPY AND STENT PLACEMENT Left 08/11/2021   Procedure: [place], URETEROSCOPY AND STENT PLACEMENT;  Surgeon: Cleon Gustin, MD;  Location: [facility];  Service: Urology;  Laterality: Left;   CYSTOSCOPY WITH RETROGRADE PYELOGRAM, URETEROSCOPY AND STENT PLACEMENT Left 09/08/2021   Procedure: CYSTOSCOPY WITH RETROGRADE PYELOGRAM, URETEROSCOPY AND STENT EXCHANGE;  Surgeon: Cleon Gustin, MD;  Location: [facility];  Service: Urology;  Laterality: Left;   CYSTOSCOPY WITH RETROGRADE PYELOGRAM, URETEROSCOPY AND STENT PLACEMENT Right 10/06/2021   Procedure: CYSTOSCOPY WITH RETROGRADE PYELOGRAM, URETEROSCOPY AND STENT PLACEMENT;  Surgeon: Cleon Gustin, MD;  Location: [facility];  Service: Urology;  Laterality: Right;   CYSTOSCOPY WITH RETROGRADE PYELOGRAM, URETEROSCOPY AND STENT PLACEMENT Right 10/27/2021   Procedure: CYSTOSCOPY WITH RETROGRADE PYELOGRAM, URETEROSCOPY AND STENT EXCHANGE;  Surgeon: Cleon Gustin, MD;  Location: [facility];  Service: Urology;  Laterality: Right;   EXTRACORPOREAL SHOCK WAVE LITHOTRIPSY Left 05/31/2021   Procedure: EXTRACORPOREAL SHOCK WAVE LITHOTRIPSY (ESWL);  Surgeon: Cleon Gustin, MD;  Location: [facility];  Service: Urology;  Laterality: Left;   EXTRACORPOREAL SHOCK WAVE LITHOTRIPSY Left 06/28/2021   Procedure: EXTRACORPOREAL SHOCK WAVE LITHOTRIPSY (ESWL);  Surgeon: Cleon Gustin, MD;  Location: [facility];  Service: Urology;  Laterality: Left;   HOLMIUM LASER APPLICATION Left [id number]   Procedure: HOLMIUM LASER APPLICATION;  Surgeon: Cleon Gustin, MD;  Location: [facility];  Service: Urology;  Laterality:  Left;   HOLMIUM LASER APPLICATION Left [id number]   Procedure: HOLMIUM LASER APPLICATION;  Surgeon: Cleon Gustin, MD;  Location: [facility];  Service: Urology;  Laterality: Left;   HOLMIUM LASER APPLICATION Right [id number]   Procedure: HOLMIUM LASER APPLICATION;  Surgeon: Cleon Gustin, MD;  Location: [facility];  Service: Urology;  Laterality: Right;   HOLMIUM LASER APPLICATION Right [id number]    Procedure: HOLMIUM LASER APPLICATION;  Surgeon: Cleon Gustin, MD;  Location: [facility];  Service: Urology;  Laterality: Right;   LEFT AND RIGHT HEART CATHETERIZATION WITH CORONARY ANGIOGRAM N/A 09/03/2013   Procedure: LEFT AND RIGHT HEART CATHETERIZATION WITH CORONARY ANGIOGRAM;  Surgeon: Felicia Blanks, MD;  Location: [facility] CATH LAB;  Service: Cardiovascular;  Laterality: N/A;   OOPHORECTOMY     removed 1 ovary and 1 ovary remains.   POLYPECTOMY  07/01/2019   Procedure: POLYPECTOMY;  Surgeon: Felicia Binder, MD;  [facility];  Service: Endoscopy;;   STONE EXTRACTION WITH BASKET Left 09/08/2021   Procedure: STONE EXTRACTION WITH BASKET;  Surgeon: Cleon Gustin, MD;  Location: [facility];  Service: Urology;  Laterality: Left;   STONE EXTRACTION WITH BASKET Right 10/27/2021   Procedure: STONE EXTRACTION WITH BASKET;  Surgeon: Cleon Gustin, MD;  Location: [facility];  Service: Urology;  Laterality: Right;   ureteral extraction of kidney stone       Current Outpatient Medications  Medication Sig Dispense Refill   acetaminophen (TYLENOL) 500 MG tablet Take 1,000 mg by mouth every 6 (six) hours as needed for moderate pain or headache. (Patient not taking: Reported on 03/16/2022)     albuterol (VENTOLIN HFA) 108 (90 Base) MCG/ACT inhaler Inhale 2 puffs into the lungs every 6 (six) hours as needed for wheezing or shortness of breath. (Patient not taking: Reported on 03/16/2022) 18 g 0   amLODipine (NORVASC) 10 MG tablet Take 1 tablet (10 mg total) by mouth daily. 90 tablet 2   apixaban (ELIQUIS) 5 MG TABS tablet Take 2 tablets ('10mg'$ ) twice daily for 7 days, then 1 tablet ('5mg'$ ) twice daily. FOR NEW DVT 74 tablet 0   apixaban (ELIQUIS) 5 MG TABS tablet Take 1 tablet (5 mg total) by mouth 2 (two) times daily. START AFTER STARTER PACK 60 tablet 3   bisoprolol (ZEBETA) 10 MG tablet Take 1 tablet (10 mg total) by mouth daily. 90 tablet 1   diclofenac (VOLTAREN) 75 MG EC tablet Take 1  tablet (75 mg total) by mouth 2 (two) times daily. (Patient not taking: Reported on 03/16/2022) 30 tablet 0   diclofenac Sodium (VOLTAREN) 1 % GEL Apply 2 g topically 4 (four) times daily. (Patient not taking: Reported on 03/16/2022) 150 g 1   glucose blood (ONETOUCH VERIO) test strip Use as instructed (Patient not taking: Reported on 03/16/2022) 100 each 12   lisinopril (ZESTRIL) 10 MG tablet Take 10 mg by mouth daily. (Patient not taking: Reported on 03/16/2022)     ONETOUCH DELICA LANCETS FINE MISC Use to check BG once daily (Patient not taking: Reported on 03/16/2022) 100 each 2   oxyCODONE-acetaminophen (PERCOCET) 5-325 MG tablet Take 1 tablet by mouth every 4 (four) hours as needed for severe pain. (Patient not taking: Reported on 03/16/2022) 30 tablet 0   rosuvastatin (CRESTOR) 10 MG tablet Take 1 tablet (10 mg total) by mouth daily. 90 tablet 3   sacubitril-valsartan (ENTRESTO) 97-103 MG Take 1 tablet by mouth twice daily 90 tablet 3   tamsulosin (FLOMAX) 0.4 MG  CAPS capsule Take 1 capsule (0.4 mg total) by mouth daily after supper. (Patient not taking: Reported on 03/16/2022) 30 capsule 1   Vibegron (GEMTESA) 75 MG TABS Take 1 capsule by mouth daily. (Patient not taking: Reported on 03/16/2022) 30 tablet 0   No current facility-administered medications for this visit.    Allergies:   Levaquin [levofloxacin in d5w] and Penicillins    ROS:  Please see the history of present illness.   Otherwise, review of systems are positive for ***.   All other systems are reviewed and negative.    PHYSICAL EXAM: VS:  LMP 01/22/1981 (LMP Unknown)  , BMI There is no height or weight on file to calculate BMI. GENERAL:  Well appearing NECK:  No jugular venous distention, waveform within normal limits, carotid upstroke brisk and symmetric, no bruits, no thyromegaly LUNGS:  Clear to auscultation bilaterally CHEST:  Unremarkable HEART:  PMI not displaced or sustained,S1 and S2 within normal limits, no S3, no S4,  no clicks, no rubs, *** murmurs ABD:  Flat, positive bowel sounds normal in frequency in pitch, no bruits, no rebound, no guarding, no midline pulsatile mass, no hepatomegaly, no splenomegaly EXT:  2 plus pulses throughout, no edema, no cyanosis no clubbing     ***GENERAL:  Well appearing NECK:  No jugular venous distention, waveform within normal limits, carotid upstroke brisk and symmetric, no bruits, no thyromegaly LUNGS:  Clear to auscultation bilaterally CHEST:  Unremarkable HEART:  PMI not displaced or sustained,S1 and S2 within normal limits, no S3, no S4, no clicks, no rubs, no murmurs ABD:  Flat, positive bowel sounds normal in frequency in pitch, no bruits, no rebound, no guarding, no midline pulsatile mass, no hepatomegaly, no splenomegaly EXT:  2 plus pulses throughout, no edema, no cyanosis no clubbing  EKG:  EKG is *** ordered today. 08/11/2021 sinus rhythm, rate ***, left axis deviation, left anterior fascicular block, anterolateral T wave inversions have improved and nonspecific ST-T wave changes not present.   Recent Labs: 10/25/2021: BUN 20; Creatinine, Ser 0.84; Potassium 3.5; Sodium 144    Lipid Panel    Component Value Date/Time   CHOL 208 (H) 05/05/2021 1526   TRIG 240 (H) 05/05/2021 1526   HDL 40 05/05/2021 1526   CHOLHDL 5.2 (H) 05/05/2021 1526   CHOLHDL 4.6 12/17/2013 0353   VLDL 69 (H) 12/17/2013 0353   LDLCALC 125 (H) 05/05/2021 1526      Wt Readings from Last 3 Encounters:  03/16/22 158 lb 6.4 oz (71.8 kg)  11/29/21 151 lb 9.6 oz (68.8 kg)  11/28/21 149 lb 14.6 oz (68 kg)      Other studies Reviewed: Additional studies/ records that were reviewed today include: *** Review of the above records demonstrates:  Please see elsewhere in the note.     ASSESSMENT AND PLAN:  Hypertension:   Blood pressure is ***controlled.  No change in therapy.  Nonischemic cardiomyopathy:   EF improved from 30 to 65%.  ***   DVT:  ***   This seems to be  unprovoked.  I would given the extent of this and ongoing swelling continue the full dose anticoagulation for at least 6 months.  I would like to see her back in February and might even consider rescanning.  As unprovoked she would need maintenance therapy.  Aortic atherosclerosis: ***   We will pursue risk reduction.  This was noted incidentally on CT.   Current medicines are reviewed at length with the patient today.  The  patient does not have concerns regarding medicines.  The following changes have been made:  ***  Labs/ tests ordered today include: ***  No orders of the defined types were placed in this encounter.    Disposition:   FU with me in *** months.     Signed, Felicia Breeding, MD  06/04/2022 9:15 PM    [facility]

## 2022-06-07 ENCOUNTER — Ambulatory Visit: Payer: Medicare HMO | Admitting: Cardiology

## 2022-06-07 DIAGNOSIS — I5022 Chronic systolic (congestive) heart failure: Secondary | ICD-10-CM

## 2022-06-07 DIAGNOSIS — I824Y9 Acute embolism and thrombosis of unspecified deep veins of unspecified proximal lower extremity: Secondary | ICD-10-CM

## 2022-06-26 ENCOUNTER — Telehealth: Payer: Self-pay | Admitting: Family

## 2022-06-26 NOTE — Telephone Encounter (Signed)
Contacted [name] to schedule their annual wellness visit. Appointment made for 07/05/2022.  Thank you,  Colletta Maryland,  [place] [facility] Direct Dial ??[phone]

## 2022-07-04 DIAGNOSIS — D239 Other benign neoplasm of skin, unspecified: Secondary | ICD-10-CM | POA: Diagnosis not present

## 2022-07-04 DIAGNOSIS — D485 Neoplasm of uncertain behavior of skin: Secondary | ICD-10-CM | POA: Diagnosis not present

## 2022-07-04 DIAGNOSIS — L820 Inflamed seborrheic keratosis: Secondary | ICD-10-CM | POA: Diagnosis not present

## 2022-07-12 ENCOUNTER — Telehealth (INDEPENDENT_AMBULATORY_CARE_PROVIDER_SITE_OTHER): Payer: Medicare HMO | Admitting: Family Medicine

## 2022-07-12 NOTE — Progress Notes (Signed)
Tried 4 times to call and send video link, patient did not respond, left voicemail no response.

## 2022-07-19 ENCOUNTER — Telehealth: Payer: Self-pay | Admitting: Family

## 2022-07-19 NOTE — Telephone Encounter (Signed)
Called patient to schedule Medicare Annual Wellness Visit (AWV). Left message for patient to call back and schedule Medicare Annual Wellness Visit (AWV).  Last date of AWV: 06/20/2021   Please schedule an appointment at any time with either Laura or Courtney, NHA's. .  If any questions, please contact me at [phone].  Thank you,  Stephanie,  [facility] Clinical Support [facility] AWV Program Direct Dial ??[phone]    

## 2022-07-30 NOTE — Progress Notes (Unsigned)
  Cardiology Office Note:   Date:  08/02/2022  ID:  [name], DOB 1945/08/21, MRN [id number]   History of Present Illness:   [name] is a 78 y.o. female who presents  for ongoing assessment and management of hypertension, chronic dyspnea, nonischemic cardiomyopathy, with probable OSA.   In August 2023 she was found to have DVT.  This was in the right popliteal and femoral veins.  Since I last saw her she has had no new cardiovascular problems. The patient denies any new symptoms such as chest discomfort, neck or arm discomfort. There has been no new shortness of breath, PND or orthopnea. There have been no reported palpitations, presyncope or syncope.   ROS: As stated in the HPI and negative for all other systems.  Studies Reviewed:    EKG:  NA    Risk Assessment/Calculations:         Physical Exam:   VS:  BP (!) 158/98   Pulse 60   Ht  (1.626 m)   Wt 160 lb (72.6 kg)   LMP 01/22/1981 (LMP Unknown)   BMI 27.46 kg/m    Wt Readings from Last 3 Encounters:  08/02/22 160 lb (72.6 kg)  03/16/22 158 lb 6.4 oz (71.8 kg)  11/29/21 151 lb 9.6 oz (68.8 kg)     GEN: Well nourished, well developed in no acute distress NECK: No JVD; No carotid bruits CARDIAC: RRR, no murmurs, rubs, gallops RESPIRATORY:  Clear to auscultation without rales, wheezing or rhonchi  ABDOMEN: Soft, non-tender, non-distended EXTREMITIES:  Mild left greater than right leg edema; No deformity   ASSESSMENT AND PLAN:    Hypertension: Her blood pressure is mildly elevated.  She is not supposed be taking lisinopril and I will remove it from her list.  She might need spironolactone.  I will check blood pressure readings and she will return to the diary.  Nonischemic cardiomyopathy:   EF improved from 30 to 65%.   No change in therapy   DVT: This seems to be unprovoked.  I am going to continue anticoagulation.  The maintenance dose will be 2-1/2 twice daily.  Aortic atherosclerosis:.  She will  continue with risk reduction.  Otherwise no change in therapy.  We will pursue risk reduction.  This was noted incidentally on CT.   Signed, Rollene Rotunda, MD

## 2022-07-31 DIAGNOSIS — I824Y9 Acute embolism and thrombosis of unspecified deep veins of unspecified proximal lower extremity: Secondary | ICD-10-CM | POA: Insufficient documentation

## 2022-07-31 DIAGNOSIS — I700 Atherosclerosis of aorta: Secondary | ICD-10-CM | POA: Insufficient documentation

## 2022-08-02 ENCOUNTER — Ambulatory Visit: Payer: Medicare HMO | Admitting: Cardiology

## 2022-08-02 ENCOUNTER — Encounter: Payer: Self-pay | Admitting: Cardiology

## 2022-08-02 VITALS — BP 158/98 | HR 60 | Ht 64.0 in | Wt 160.0 lb

## 2022-08-02 DIAGNOSIS — I10 Essential (primary) hypertension: Secondary | ICD-10-CM | POA: Diagnosis not present

## 2022-08-02 DIAGNOSIS — I5022 Chronic systolic (congestive) heart failure: Secondary | ICD-10-CM | POA: Diagnosis not present

## 2022-08-02 DIAGNOSIS — I824Y9 Acute embolism and thrombosis of unspecified deep veins of unspecified proximal lower extremity: Secondary | ICD-10-CM | POA: Diagnosis not present

## 2022-08-02 DIAGNOSIS — I700 Atherosclerosis of aorta: Secondary | ICD-10-CM | POA: Diagnosis not present

## 2022-08-02 NOTE — Patient Instructions (Signed)
Medication Instructions:  Please discontinue your Lisinopril. Decrease Eliquis to 2.5 mg twice a day. Continue all other medications as listed.  *If you need a refill on your cardiac medications before your next appointment, please call your pharmacy*  Follow-Up: At [facility], you and your health needs are our priority.  As part of our continuing mission to provide you with exceptional heart care, we have created designated Provider Care Teams.  These Care Teams include your primary Cardiologist (physician) and Advanced Practice Providers (APPs -  Physician Assistants and Nurse Practitioners) who all work together to provide you with the care you need, when you need it.  We recommend signing up for the patient portal called "MyChart".  Sign up information is provided on this After Visit Summary.  MyChart is used to connect with patients for Virtual Visits (Telemedicine).  Patients are able to view lab/test results, encounter notes, upcoming appointments, etc.  Non-urgent messages can be sent to your provider as well.   To learn more about what you can do with MyChart, go to [email or web address].    Your next appointment:   6 month(s)  Provider:   Rollene Rotunda, MD    Please keep a BP diary and drop in off at the [facility] office for Dr Hochrein's review.

## 2022-08-15 ENCOUNTER — Telehealth: Payer: Self-pay | Admitting: Family

## 2022-08-15 NOTE — Telephone Encounter (Signed)
Called patient to schedule Medicare Annual Wellness Visit (AWV). Left message for patient to call back and schedule Medicare Annual Wellness Visit (AWV).  Last date of AWV: 06/20/2021   Please schedule an appointment at any time with Laura, NHA.  If any questions, please contact me at [phone].  Thank you,  Stephanie,  AMB Clinical Support [facility] AWV Program Direct Dial ??[phone]   

## 2022-08-28 DIAGNOSIS — D485 Neoplasm of uncertain behavior of skin: Secondary | ICD-10-CM | POA: Diagnosis not present

## 2022-08-28 DIAGNOSIS — L723 Sebaceous cyst: Secondary | ICD-10-CM | POA: Diagnosis not present

## 2022-08-28 DIAGNOSIS — L570 Actinic keratosis: Secondary | ICD-10-CM | POA: Diagnosis not present

## 2022-09-19 ENCOUNTER — Ambulatory Visit (HOSPITAL_COMMUNITY)
Admission: RE | Admit: 2022-09-19 | Discharge: 2022-09-19 | Disposition: A | Discharge status: Home / Self Care | Payer: Medicare HMO | Source: Ambulatory Visit | Attending: Urology | Admitting: Urology

## 2022-09-19 ENCOUNTER — Other Ambulatory Visit: Payer: Self-pay

## 2022-09-19 ENCOUNTER — Ambulatory Visit (INDEPENDENT_AMBULATORY_CARE_PROVIDER_SITE_OTHER): Payer: Medicare HMO

## 2022-09-19 VITALS — Ht 64.0 in | Wt 152.0 lb

## 2022-09-19 DIAGNOSIS — N200 Calculus of kidney: Secondary | ICD-10-CM | POA: Insufficient documentation

## 2022-09-19 DIAGNOSIS — Z0000 Encounter for general adult medical examination without abnormal findings: Secondary | ICD-10-CM | POA: Diagnosis not present

## 2022-09-19 NOTE — Progress Notes (Signed)
Subjective:   [name] is a 77 y.o. female who presents for Medicare Annual (Subsequent) preventive examination. I connected with  Theodosia Paling on 09/19/22 by a audio enabled telemedicine application and verified that I am speaking with the correct person using two identifiers.  Patient Location: Home  Provider Location: Home Office  I discussed the limitations of evaluation and management by telemedicine. The patient expressed understanding and agreed to proceed.  Review of Systems     Cardiac Risk Factors include: advanced age (>11men, >56 women);diabetes mellitus;hypertension;dyslipidemia     Objective:    Today's Vitals   09/19/22 1352  Weight: 152 lb (68.9 kg)  Height: 5\' 4"  (1.626 m)   Body mass index is 26.09 kg/m.     09/19/2022    1:57 PM 11/28/2021    9:22 AM 10/27/2021   10:57 AM 10/25/2021   12:54 PM 10/06/2021    8:32 AM 10/04/2021   10:54 AM 09/08/2021    8:39 AM  Advanced Directives  Does Patient Have a Medical Advance Directive? No No No No No No No  Would patient like information on creating a medical advance directive? No - Patient declined No - Patient declined No - Patient declined No - Patient declined  No - Patient declined Yes (MAU/Ambulatory/Procedural Areas - Information given)    Current Medications (verified) Outpatient Encounter Medications as of 09/19/2022  Medication Sig   acetaminophen (TYLENOL) 500 MG tablet Take 1,000 mg by mouth every 6 (six) hours as needed for moderate pain or headache.   albuterol (VENTOLIN HFA) 108 (90 Base) MCG/ACT inhaler Inhale 2 puffs into the lungs every 6 (six) hours as needed for wheezing or shortness of breath.   amLODipine (NORVASC) 10 MG tablet Take 1 tablet (10 mg total) by mouth daily.   apixaban (ELIQUIS) 5 MG TABS tablet Take 1 tablet (5 mg total) by mouth 2 (two) times daily. START AFTER STARTER PACK   bisoprolol (ZEBETA) 10 MG tablet Take 1 tablet (10 mg total) by mouth daily.   glucose blood  (ONETOUCH VERIO) test strip Use as instructed   ONETOUCH DELICA LANCETS FINE MISC Use to check BG once daily   oxyCODONE-acetaminophen (PERCOCET) 5-325 MG tablet Take 1 tablet by mouth every 4 (four) hours as needed for severe pain.   rosuvastatin (CRESTOR) 10 MG tablet Take 1 tablet (10 mg total) by mouth daily.   sacubitril-valsartan (ENTRESTO) 97-103 MG Take 1 tablet by mouth twice daily   Vibegron (GEMTESA) 75 MG TABS Take 1 capsule by mouth daily.   [DISCONTINUED] escitalopram (LEXAPRO) 20 MG tablet Take 1 tablet (20 mg total) by mouth daily. (Patient not taking: Reported on 05/10/2020)   No facility-administered encounter medications on file as of 09/19/2022.    Allergies (verified) Levaquin [levofloxacin in d5w] and Penicillins   History: Past Medical History:  Diagnosis Date   Asthma    Chronic systolic heart failure (HCC)    Echo (08/26/13):  Mild LVH. EF 25% to 30%. Diffuse HK. Aortic valve: There was trivial regurgitation. Mitral valve: There was mild regurgitation. Left atrium: The atrium was mildly dilated.   GERD (gastroesophageal reflux disease)    Headache(784.0)    History of kidney stones    History of nephrolithiasis    Hypertension    NICM (nonischemic cardiomyopathy) (HCC)    a. LHC (08/2013):  no CAD   Sleep apnea    does not use machine   Stroke (HCC)    Type II or unspecified  type diabetes mellitus without mention of complication, uncontrolled    borderline   Past Surgical History:  Procedure Laterality Date   ABDOMINAL HYSTERECTOMY     COLONOSCOPY WITH PROPOFOL N/A 07/01/2019   Procedure: COLONOSCOPY WITH PROPOFOL;  Surgeon: West Bali, MD;  [facility];  Service: Endoscopy;  Laterality: N/A;  2:15pm - pt will not move up   CYSTOSCOPY WITH RETROGRADE PYELOGRAM, URETEROSCOPY AND STENT PLACEMENT Left 08/11/2021   Procedure: CYSTOSCOPY WITH RETROGRADE PYELOGRAM, URETEROSCOPY AND STENT PLACEMENT;  Surgeon: Malen Gauze, MD;  Location: [facility]  ORS;  Service: Urology;  Laterality: Left;   CYSTOSCOPY WITH RETROGRADE PYELOGRAM, URETEROSCOPY AND STENT PLACEMENT Left 09/08/2021   Procedure: CYSTOSCOPY WITH RETROGRADE PYELOGRAM, URETEROSCOPY AND STENT EXCHANGE;  Surgeon: Malen Gauze, MD;  Location: [facility] ORS;  Service: Urology;  Laterality: Left;   CYSTOSCOPY WITH RETROGRADE PYELOGRAM, URETEROSCOPY AND STENT PLACEMENT Right 10/06/2021   Procedure: CYSTOSCOPY WITH RETROGRADE PYELOGRAM, URETEROSCOPY AND STENT PLACEMENT;  Surgeon: Malen Gauze, MD;  Location: [facility];  Service: Urology;  Laterality: Right;   CYSTOSCOPY WITH RETROGRADE PYELOGRAM, URETEROSCOPY AND STENT PLACEMENT Right 10/27/2021   Procedure: CYSTOSCOPY WITH RETROGRADE PYELOGRAM, URETEROSCOPY AND STENT EXCHANGE;  Surgeon: Malen Gauze, MD;  Location: [facility];  Service: Urology;  Laterality: Right;   EXTRACORPOREAL SHOCK WAVE LITHOTRIPSY Left 05/31/2021   Procedure: EXTRACORPOREAL SHOCK WAVE LITHOTRIPSY (ESWL);  Surgeon: Malen Gauze, MD;  Location: [facility];  Service: Urology;  Laterality: Left;   EXTRACORPOREAL SHOCK WAVE LITHOTRIPSY Left 06/28/2021   Procedure: EXTRACORPOREAL SHOCK WAVE LITHOTRIPSY (ESWL);  Surgeon: Malen Gauze, MD;  Location: [facility];  Service: Urology;  Laterality: Left;   HOLMIUM LASER APPLICATION Left 08/11/2021   Procedure: HOLMIUM LASER APPLICATION;  Surgeon: Malen Gauze, MD;  Location: [facility];  Service: Urology;  Laterality: Left;   HOLMIUM LASER APPLICATION Left 09/08/2021   Procedure: HOLMIUM LASER APPLICATION;  Surgeon: Malen Gauze, MD;  Location: [facility];  Service: Urology;  Laterality: Left;   HOLMIUM LASER APPLICATION Right 10/06/2021   Procedure: HOLMIUM LASER APPLICATION;  Surgeon: Malen Gauze, MD;  Location: [facility];  Service: Urology;  Laterality: Right;   HOLMIUM LASER APPLICATION Right 10/27/2021   Procedure: HOLMIUM LASER APPLICATION;  Surgeon: Malen Gauze, MD;  Location: [facility];  Service:  Urology;  Laterality: Right;   LEFT AND RIGHT HEART CATHETERIZATION WITH CORONARY ANGIOGRAM N/A 09/03/2013   Procedure: LEFT AND RIGHT HEART CATHETERIZATION WITH CORONARY ANGIOGRAM;  Surgeon: Kathleene Hazel, MD;  Location: [facility] CATH LAB;  Service: Cardiovascular;  Laterality: N/A;   OOPHORECTOMY     removed 1 ovary and 1 ovary remains.   POLYPECTOMY  07/01/2019   Procedure: POLYPECTOMY;  Surgeon: West Bali, MD;  [facility];  Service: Endoscopy;;   STONE EXTRACTION WITH BASKET Left 09/08/2021   Procedure: STONE EXTRACTION WITH BASKET;  Surgeon: Malen Gauze, MD;  Location: [facility];  Service: Urology;  Laterality: Left;   STONE EXTRACTION WITH BASKET Right 10/27/2021   Procedure: STONE EXTRACTION WITH BASKET;  Surgeon: Malen Gauze, MD;  Location: [facility];  Service: Urology;  Laterality: Right;   ureteral extraction of kidney stone     Family History  Problem Relation Age of Onset   Hypertension Mother    Arthritis Mother    CAD Mother 78   Hip fracture Mother 45   COPD Father    Crohn's disease Sister    Cancer Sister  lung   Early death Brother 24       house fire   Diabetes Maternal Grandmother    Hypertension Sister    Hypertension Brother    Colon cancer Neg Hx    Social History   Socioeconomic History   Marital status: Divorced    Spouse name: Not on file   Number of children: 2   Years of education: Not on file   Highest education level: Not on file  Occupational History   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 06/19/21-Currently lives  with daughter and is actively looking for a rental home.    1 son and 1 daughter. Son died in 10-18-2015.   2 grandchildren   Left handedNo caffiene   Social Determinants of Health   Financial Resource Strain: Low Risk  (09/19/2022)   Overall Financial Resource Strain (CARDIA)    Difficulty of Paying Living Expenses: Not hard at all  Food Insecurity: No Food Insecurity (09/19/2022)   Hunger Vital Sign    Worried About Running Out of Food in the Last Year: Never true    Ran Out of Food in the Last Year: Never true  Transportation Needs: No Transportation Needs (09/19/2022)   PRAPARE - Administrator, Civil Service (Medical): No    Lack of Transportation (Non-Medical): No  Physical Activity: Insufficiently Active (09/19/2022)   Exercise Vital Sign    Days of Exercise per Week: 3 days    Minutes of Exercise per Session: 30 min  Stress: No Stress Concern Present (09/19/2022)   [employer] of Occupational Health - Occupational Stress Questionnaire    Feeling of Stress : Not at all  Social Connections: Moderately Integrated (09/19/2022)   Social Connection and Isolation Panel [NHANES]    Frequency of Communication with Friends and Family: More than three times a week    Frequency of Social Gatherings with Friends and Family: More than three times a week    Attends Religious Services: More than 4 times per year    Active Member of [employer] or Organizations: Yes    Attends Engineer, structural: More than 4 times per year    Marital Status: Divorced    Tobacco Counseling Counseling given: Not Answered   Clinical Intake:  Pre-visit preparation completed: Yes  Pain : No/denies pain     Nutritional Risks: None Diabetes: Yes CBG done?: No Did pt. bring in CBG monitor from home?: No  How often do you need to have someone help you when you read instructions, pamphlets, or other written materials from your doctor or pharmacy?: 1 - Never  Diabetic?yes Nutrition Risk  Assessment:  Has the patient had any N/V/D within the last 2 months?  No  Does the patient have any non-healing wounds?  No  Has the patient had any unintentional weight loss or weight gain?  No   Diabetes:  Is the patient diabetic?  Yes  If diabetic, was a CBG obtained today?  No  Did the patient bring in their glucometer from home?  No  How often do you monitor your CBG's? Never .   Financial Strains and Diabetes Management:  Are you having any financial strains with the device, your supplies or your medication? No .  Does the patient want to be seen by Chronic Care Management for management of their diabetes?  No  Would the patient like to be referred to a Nutritionist or for Diabetic Management?  No   Diabetic Exams:  Diabetic Eye Exam: Overdue for diabetic eye exam. Pt has been advised about the importance in completing this exam. Patient advised to call and schedule an eye exam. Diabetic Foot Exam: Overdue, Pt has been advised about the importance in completing this exam. Pt is scheduled for diabetic foot exam on next office visit .   Interpreter Needed?: No  Information entered by :: Renie Ora, LPN   Activities of Daily Living    09/19/2022    1:57 PM 11/28/2021    9:24 AM  In your present state of health, do you have any difficulty performing the following activities:  Hearing? 0   Vision? 0   Difficulty concentrating or making decisions? 0   Walking or climbing stairs? 0   Dressing or bathing? 0   Doing errands, shopping? 0 0  Preparing Food and eating ? N   Using the Toilet? N   In the past six months, have you accidently leaked urine? N   Do you have problems with loss of bowel control? N   Managing your Medications? N   Managing your Finances? N   Housekeeping or managing your Housekeeping? N     Patient Care Team: Junie Spencer, FNP as PCP - General (Family Medicine) Rollene Rotunda, MD as PCP - Cardiology (Cardiology) West Bali, MD  (Inactive) as Consulting Physician (Gastroenterology) Danella Maiers, [facility] as [facility] Care Management (Pharmacist)  Indicate any recent Medical Services you may have received from other than [facility] providers in the past year (date may be approximate).     Assessment:   This is a routine wellness examination for [name].  Hearing/Vision screen Vision Screening - Comments:: Wears rx glasses - will schedule with Dr.Lee routine eye exams with    Dietary issues and exercise activities discussed: Current Exercise Habits: Home exercise routine, Type of exercise: walking, Time (Minutes): 30, Frequency (Times/Week): 3, Weekly Exercise (Minutes/Week): 90, Intensity: Mild, Exercise limited by: None identified   Goals Addressed             This Visit's Progress    DIET - INCREASE WATER INTAKE         Depression Screen    09/19/2022    1:56 PM 09/28/2021    8:31 AM 06/20/2021    9:13 AM 05/05/2021    2:29 PM 08/26/2020    9:54 AM 02/07/2019    9:14 AM 02/07/2019    8:57 AM  PHQ 2/9 Scores  PHQ - 2 Score 0 2 2 2 2 4  0  PHQ- 9 Score  5 7 6 14 14      Fall Risk    09/19/2022    1:53 PM 09/28/2021    8:31 AM 06/20/2021    9:19 AM 05/05/2021    2:30 PM 08/26/2020    9:54 AM  Fall Risk   Falls in the past year? 0 0 1 1 1   Number falls in past yr: 0  1 1 1   Injury with Fall? 0  0 0 0  Risk for fall due  to : No Fall Risks  History of fall(s);Impaired balance/gait Impaired balance/gait   Follow up Falls prevention discussed  Falls prevention discussed Education provided Falls prevention discussed    FALL RISK PREVENTION PERTAINING TO THE HOME:  Any stairs in or around the home? Yes  If so, are there any without handrails? No  Home free of loose throw rugs in walkways, pet beds, electrical cords, etc? Yes  Adequate lighting in your home to reduce risk of falls? Yes   ASSISTIVE DEVICES UTILIZED TO PREVENT FALLS:  Life alert? No  Use of a cane, walker or w/c? No  Grab  bars in the bathroom? No  Shower chair or bench in shower? No  Elevated toilet seat or a handicapped toilet? No          09/19/2022    1:57 PM  6CIT Screen  What Year? 0 points  What month? 0 points  What time? 0 points  Count back from 20 0 points  Months in reverse 0 points  Repeat phrase 0 points  Total Score 0 points    Immunizations Immunization History  Administered Date(s) Administered   Fluad Quad(high Dose 65+) 02/07/2019, 02/07/2021   Influenza, High Dose Seasonal PF 01/22/2018   Influenza,inj,Quad PF,6+ Mos 01/13/2015, 12/30/2015   Moderna Sars-Covid-2 Vaccination 03/16/2020   PFIZER(Purple Top)SARS-COV-2 Vaccination 06/08/2019, 07/08/2019   Pneumococcal Conjugate-13 02/27/2014   Pneumococcal Polysaccharide-23 04/25/2010   Tdap 12/23/2019    TDAP status: Up to date  Flu Vaccine status: Up to date  Pneumococcal vaccine status: Up to date  Covid-19 vaccine status: Completed vaccines  Qualifies for Shingles Vaccine? Yes   Zostavax completed No   Shingrix Completed?: No.    Education has been provided regarding the importance of this vaccine. Patient has been advised to call insurance company to determine out of pocket expense if they have not yet received this vaccine. Advised may also receive vaccine at local pharmacy or Health Dept. Verbalized acceptance and understanding.  Screening Tests Health Maintenance  Topic Date Due   Zoster Vaccines- Shingrix (1 of 2) Never done   OPHTHALMOLOGY EXAM  07/03/2015   COVID-19 Vaccine (4 - 2023-24 season) 12/09/2021   HEMOGLOBIN A1C  04/27/2022   Diabetic kidney evaluation - Urine ACR  05/05/2022   FOOT EXAM  05/05/2022   Diabetic kidney evaluation - eGFR measurement  10/26/2022   INFLUENZA VACCINE  11/09/2022   DEXA SCAN  05/06/2023   Medicare Annual Wellness (AWV)  09/19/2023   DTaP/Tdap/Td (2 - Td or Tdap) 12/22/2029   Pneumonia Vaccine 59+ Years old  Completed   Hepatitis C Screening  Completed   HPV  VACCINES  Aged Out   Colonoscopy  Discontinued    Health Maintenance  Health Maintenance Due  Topic Date Due   Zoster Vaccines- Shingrix (1 of 2) Never done   OPHTHALMOLOGY EXAM  07/03/2015   COVID-19 Vaccine (4 - 2023-24 season) 12/09/2021   HEMOGLOBIN A1C  04/27/2022   Diabetic kidney evaluation - Urine ACR  05/05/2022   FOOT EXAM  05/05/2022    Colorectal cancer screening: No longer required.   Mammogram status: No longer required due to age.  Bone Density status: Completed 05/05/2021. Results reflect: Bone density results: OSTEOPOROSIS. Repeat every 2 years.  Lung Cancer Screening: (Low Dose CT Chest recommended if Age 35-80 years, 30 pack-year currently smoking OR have quit w/in 15years.) does not qualify.   Lung Cancer Screening Referral: n/a  Additional Screening:  Hepatitis C Screening: does not qualify;  Completed 01/22/2018  Vision Screening: Recommended annual ophthalmology exams for early detection of glaucoma and other disorders of the eye. Is the patient up to date with their annual eye exam?  No  Who is the provider or what is the name of the office in which the patient attends annual eye exams? Dr.Lee  If pt is not established with a provider, would they like to be referred to a provider to establish care? No .   Dental Screening: Recommended annual dental exams for proper oral hygiene  Community Resource Referral / Chronic Care Management: CRR required this visit?  No   CCM required this visit?  No      Plan:     I have personally reviewed and noted the following in the patient's chart:   Medical and social history Use of alcohol, tobacco or illicit drugs  Current medications and supplements including opioid prescriptions. Patient is not currently taking opioid prescriptions. Functional ability and status Nutritional status Physical activity Advanced directives List of other physicians Hospitalizations, surgeries, and ER visits in previous 12  months Vitals Screenings to include cognitive, depression, and falls Referrals and appointments  In addition, I have reviewed and discussed with patient certain preventive protocols, quality metrics, and best practice recommendations. A written personalized care plan for preventive services as well as general preventive health recommendations were provided to patient.     Lorrene Reid, LPN   [id number]   Nurse Notes: none

## 2022-09-19 NOTE — Patient Instructions (Signed)
[name] , Thank you for taking time to come for your Medicare Wellness Visit. I appreciate your ongoing commitment to your health goals. Please review the following plan we discussed and let me know if I can assist you in the future.   These are the goals we discussed:  Goals      DIET - INCREASE WATER INTAKE     Exercise 150 min/wk Moderate Activity     Try to exercise more. Try to find rental home.         This is a list of the screening recommended for you and due dates:  Health Maintenance  Topic Date Due   Zoster (Shingles) Vaccine (1 of 2) Never done   Eye exam for diabetics  07/03/2015   COVID-19 Vaccine (4 - 2023-24 season) 12/09/2021   Hemoglobin A1C  04/27/2022   Yearly kidney health urinalysis for diabetes  05/05/2022   Complete foot exam   05/05/2022   Yearly kidney function blood test for diabetes  10/26/2022   Flu Shot  11/09/2022   DEXA scan (bone density measurement)  05/06/2023   Medicare Annual Wellness Visit  09/19/2023   DTaP/Tdap/Td vaccine (2 - Td or Tdap) 12/22/2029   Pneumonia Vaccine  Completed   Hepatitis C Screening  Completed   HPV Vaccine  Aged Out   Colon Cancer Screening  Discontinued    Advanced directives: Advance directive discussed with you today. I have provided a copy for you to complete at home and have notarized. Once this is complete please bring a copy in to our office so we can scan it into your chart.   Conditions/risks identified: Aim for 30 minutes of exercise or brisk walking, 6-8 glasses of water, and 5 servings of fruits and vegetables each day.   Next appointment: Follow up in one year for your annual wellness visit    Preventive Care 65 Years and Older, Female Preventive care refers to lifestyle choices and visits with your health care provider that can promote health and wellness. What does preventive care include? A yearly physical exam. This is also called an annual well check. Dental exams once or twice a  year. Routine eye exams. Ask your health care provider how often you should have your eyes checked. Personal lifestyle choices, including: Daily care of your teeth and gums. Regular physical activity. Eating a healthy diet. Avoiding tobacco and drug use. Limiting alcohol use. Practicing safe sex. Taking low-dose aspirin every day. Taking vitamin and mineral supplements as recommended by your health care provider. What happens during an annual well check? The services and screenings done by your health care provider during your annual well check will depend on your age, overall health, lifestyle risk factors, and family history of disease. Counseling  Your health care provider may ask you questions about your: Alcohol use. Tobacco use. Drug use. Emotional well-being. Home and relationship well-being. Sexual activity. Eating habits. History of falls. Memory and ability to understand (cognition). Work and work Astronomer. Reproductive health. Screening  You may have the following tests or measurements: Height, weight, and BMI. Blood pressure. Lipid and cholesterol levels. These may be checked every 5 years, or more frequently if you are over 69 years old. Skin check. Lung cancer screening. You may have this screening every year starting at age 47 if you have a 30-pack-year history of smoking and currently smoke or have quit within the past 15 years. Fecal occult blood test (FOBT) of the stool. You may have this  test every year starting at age 68. Flexible sigmoidoscopy or colonoscopy. You may have a sigmoidoscopy every 5 years or a colonoscopy every 10 years starting at age 18. Hepatitis C blood test. Hepatitis B blood test. Sexually transmitted disease (STD) testing. Diabetes screening. This is done by checking your blood sugar (glucose) after you have not eaten for a while (fasting). You may have this done every 1-3 years. Bone density scan. This is done to screen for  osteoporosis. You may have this done starting at age 54. Mammogram. This may be done every 1-2 years. Talk to your health care provider about how often you should have regular mammograms. Talk with your health care provider about your test results, treatment options, and if necessary, the need for more tests. Vaccines  Your health care provider may recommend certain vaccines, such as: Influenza vaccine. This is recommended every year. Tetanus, diphtheria, and acellular pertussis (Tdap, Td) vaccine. You may need a Td booster every 10 years. Zoster vaccine. You may need this after age 31. Pneumococcal 13-valent conjugate (PCV13) vaccine. One dose is recommended after age 85. Pneumococcal polysaccharide (PPSV23) vaccine. One dose is recommended after age 50. Talk to your health care provider about which screenings and vaccines you need and how often you need them. This information is not intended to replace advice given to you by your health care provider. Make sure you discuss any questions you have with your health care provider. Document Released: 04/23/2015 Document Revised: 12/15/2015 Document Reviewed: 01/26/2015 Elsevier Interactive Patient Education  2017 [place] Prevention in the Home Falls can cause injuries. They can happen to people of all ages. There are many things you can do to make your home safe and to help prevent falls. What can I do on the outside of my home? Regularly fix the edges of walkways and driveways and fix any cracks. Remove anything that might make you trip as you walk through a door, such as a raised step or threshold. Trim any bushes or trees on the path to your home. Use bright outdoor lighting. Clear any walking paths of anything that might make someone trip, such as rocks or tools. Regularly check to see if handrails are loose or broken. Make sure that both sides of any steps have handrails. Any raised decks and porches should have guardrails on  the edges. Have any leaves, snow, or ice cleared regularly. Use sand or salt on walking paths during winter. Clean up any spills in your garage right away. This includes oil or grease spills. What can I do in the bathroom? Use night lights. Install grab bars by the toilet and in the tub and shower. Do not use towel bars as grab bars. Use non-skid mats or decals in the tub or shower. If you need to sit down in the shower, use a plastic, non-slip stool. Keep the floor dry. Clean up any water that spills on the floor as soon as it happens. Remove soap buildup in the tub or shower regularly. Attach bath mats securely with double-sided non-slip rug tape. Do not have throw rugs and other things on the floor that can make you trip. What can I do in the bedroom? Use night lights. Make sure that you have a light by your bed that is easy to reach. Do not use any sheets or blankets that are too big for your bed. They should not hang down onto the floor. Have a firm chair that has side arms. You can  use this for support while you get dressed. Do not have throw rugs and other things on the floor that can make you trip. What can I do in the kitchen? Clean up any spills right away. Avoid walking on wet floors. Keep items that you use a lot in easy-to-reach places. If you need to reach something above you, use a strong step stool that has a grab bar. Keep electrical cords out of the way. Do not use floor polish or wax that makes floors slippery. If you must use wax, use non-skid floor wax. Do not have throw rugs and other things on the floor that can make you trip. What can I do with my stairs? Do not leave any items on the stairs. Make sure that there are handrails on both sides of the stairs and use them. Fix handrails that are broken or loose. Make sure that handrails are as long as the stairways. Check any carpeting to make sure that it is firmly attached to the stairs. Fix any carpet that is loose  or worn. Avoid having throw rugs at the top or bottom of the stairs. If you do have throw rugs, attach them to the floor with carpet tape. Make sure that you have a light switch at the top of the stairs and the bottom of the stairs. If you do not have them, ask someone to add them for you. What else can I do to help prevent falls? Wear shoes that: Do not have high heels. Have rubber bottoms. Are comfortable and fit you well. Are closed at the toe. Do not wear sandals. If you use a stepladder: Make sure that it is fully opened. Do not climb a closed stepladder. Make sure that both sides of the stepladder are locked into place. Ask someone to hold it for you, if possible. Clearly mark and make sure that you can see: Any grab bars or handrails. First and last steps. Where the edge of each step is. Use tools that help you move around (mobility aids) if they are needed. These include: Canes. Walkers. Scooters. Crutches. Turn on the lights when you go into a dark area. Replace any light bulbs as soon as they burn out. Set up your furniture so you have a clear path. Avoid moving your furniture around. If any of your floors are uneven, fix them. If there are any pets around you, be aware of where they are. Review your medicines with your doctor. Some medicines can make you feel dizzy. This can increase your chance of falling. Ask your doctor what other things that you can do to help prevent falls. This information is not intended to replace advice given to you by your health care provider. Make sure you discuss any questions you have with your health care provider. Document Released: 01/21/2009 Document Revised: 09/02/2015 Document Reviewed: 05/01/2014 Elsevier Interactive Patient Education  2017 [employer].

## 2022-09-20 ENCOUNTER — Ambulatory Visit (INDEPENDENT_AMBULATORY_CARE_PROVIDER_SITE_OTHER): Payer: Medicare HMO | Admitting: Urology

## 2022-09-20 VITALS — BP 171/84 | HR 76 | Ht 64.0 in | Wt 152.0 lb

## 2022-09-20 DIAGNOSIS — N200 Calculus of kidney: Secondary | ICD-10-CM | POA: Diagnosis not present

## 2022-09-20 DIAGNOSIS — R109 Unspecified abdominal pain: Secondary | ICD-10-CM | POA: Diagnosis not present

## 2022-09-20 DIAGNOSIS — N3000 Acute cystitis without hematuria: Secondary | ICD-10-CM | POA: Diagnosis not present

## 2022-09-20 DIAGNOSIS — R32 Unspecified urinary incontinence: Secondary | ICD-10-CM

## 2022-09-20 DIAGNOSIS — R350 Frequency of micturition: Secondary | ICD-10-CM | POA: Diagnosis not present

## 2022-09-20 DIAGNOSIS — R82998 Other abnormal findings in urine: Secondary | ICD-10-CM

## 2022-09-20 LAB — URINALYSIS, ROUTINE W REFLEX MICROSCOPIC
Bilirubin, UA: NEGATIVE
Glucose, UA: NEGATIVE
Ketones, UA: NEGATIVE
Nitrite, UA: NEGATIVE
Specific Gravity, UA: 1.02 (ref 1.005–1.030)
Urobilinogen, Ur: 1 mg/dL (ref 0.2–1.0)
pH, UA: 6 (ref 5.0–7.5)

## 2022-09-20 LAB — MICROSCOPIC EXAMINATION

## 2022-09-20 LAB — BLADDER SCAN AMB NON-IMAGING: Scan Result: 0

## 2022-09-20 MED ORDER — MIRABEGRON ER 25 MG PO TB24: 25.0000 mg | ORAL_TABLET | Freq: Every day | ORAL | 0 refills | Status: DC

## 2022-09-20 MED ORDER — NITROFURANTOIN MONOHYD MACRO 100 MG PO CAPS
100.0000 mg | ORAL_CAPSULE | Freq: Two times a day (BID) | ORAL | 0 refills | Status: DC
Start: 2022-09-20 — End: 2022-12-06

## 2022-09-20 MED ORDER — OXYCODONE-ACETAMINOPHEN 5-325 MG PO TABS: 1.0000 | ORAL_TABLET | ORAL | 0 refills | Status: AC | PRN

## 2022-09-20 NOTE — Progress Notes (Signed)
09/20/2022 8:46 AM   [name] December 24, 1945 [id number]  Referring provider: Junie Spencer, FNP [address] [place],  [place] [address]  Followup nephrolithiasis and UTI   HPI: [name] is a 76yo here for followup for nephrolithiasis and UTI. No stone events since last visit. KUB from 6/11 shows bilateral renal calculi. She denies nay worsening flank pain. For the past week she has noted suprapubic and lower back pain. UA today is concerning for infection. She has urinary urgency and frequency which is bothersome.    PMH: Past Medical History:  Diagnosis Date   Asthma    Chronic systolic heart failure (HCC)    Echo (08/26/13):  Mild LVH. EF 25% to 30%. Diffuse HK. Aortic valve: There was trivial regurgitation. Mitral valve: There was mild regurgitation. Left atrium: The atrium was mildly dilated.   GERD (gastroesophageal reflux disease)    Headache(784.0)    History of kidney stones    History of nephrolithiasis    Hypertension    NICM (nonischemic cardiomyopathy) (HCC)    a. LHC (08/2013):  no CAD   Sleep apnea    does not use machine   Stroke (HCC)    Type II or unspecified type diabetes mellitus without mention of complication, uncontrolled    borderline    Surgical History: Past Surgical History:  Procedure Laterality Date   ABDOMINAL HYSTERECTOMY     COLONOSCOPY WITH PROPOFOL N/A 07/01/2019   Procedure: COLONOSCOPY WITH PROPOFOL;  Surgeon: Felicia Bali, MD;  [facility];  Service: Endoscopy;  Laterality: N/A;  2:15pm - pt will not move up   CYSTOSCOPY WITH RETROGRADE PYELOGRAM, URETEROSCOPY AND STENT PLACEMENT Left 08/11/2021   Procedure: CYSTOSCOPY WITH RETROGRADE PYELOGRAM, URETEROSCOPY AND STENT PLACEMENT;  Surgeon: Felicia Gauze, MD;  Location: [facility];  Service: Urology;  Laterality: Left;   CYSTOSCOPY WITH RETROGRADE PYELOGRAM, URETEROSCOPY AND STENT PLACEMENT Left 09/08/2021   Procedure: CYSTOSCOPY WITH RETROGRADE PYELOGRAM,  URETEROSCOPY AND STENT EXCHANGE;  Surgeon: Felicia Gauze, MD;  Location: [facility];  Service: Urology;  Laterality: Left;   CYSTOSCOPY WITH RETROGRADE PYELOGRAM, URETEROSCOPY AND STENT PLACEMENT Right 10/06/2021   Procedure: CYSTOSCOPY WITH RETROGRADE PYELOGRAM, URETEROSCOPY AND STENT PLACEMENT;  Surgeon: Felicia Gauze, MD;  Location: [facility];  Service: Urology;  Laterality: Right;   CYSTOSCOPY WITH RETROGRADE PYELOGRAM, URETEROSCOPY AND STENT PLACEMENT Right 10/27/2021   Procedure: CYSTOSCOPY WITH RETROGRADE PYELOGRAM, URETEROSCOPY AND STENT EXCHANGE;  Surgeon: Felicia Gauze, MD;  Location: [facility];  Service: Urology;  Laterality: Right;   EXTRACORPOREAL SHOCK WAVE LITHOTRIPSY Left 05/31/2021   Procedure: EXTRACORPOREAL SHOCK WAVE LITHOTRIPSY (ESWL);  Surgeon: Felicia Gauze, MD;  Location: [facility];  Service: Urology;  Laterality: Left;   EXTRACORPOREAL SHOCK WAVE LITHOTRIPSY Left 06/28/2021   Procedure: EXTRACORPOREAL SHOCK WAVE LITHOTRIPSY (ESWL);  Surgeon: Felicia Gauze, MD;  Location: [facility];  Service: Urology;  Laterality: Left;   HOLMIUM LASER APPLICATION Left 08/11/2021   Procedure: HOLMIUM LASER APPLICATION;  Surgeon: Felicia Gauze, MD;  Location: [facility];  Service: Urology;  Laterality: Left;   HOLMIUM LASER APPLICATION Left 09/08/2021   Procedure: HOLMIUM LASER APPLICATION;  Surgeon: Felicia Gauze, MD;  [facility]: [facility];  Service: Urology;  Laterality: Left;   HOLMIUM LASER APPLICATION Right 10/06/2021   Procedure: HOLMIUM LASER APPLICATION;  Surgeon: Felicia Gauze, MD;  Location: [facility];  Service: Urology;  Laterality: Right;   HOLMIUM LASER APPLICATION Right 10/27/2021   Procedure: HOLMIUM LASER APPLICATION;  Surgeon: Felicia Gauze, MD;  [facility]: [facility];  Service: Urology;  Laterality: Right;   LEFT AND RIGHT HEART CATHETERIZATION WITH CORONARY ANGIOGRAM N/A 09/03/2013   Procedure: LEFT AND RIGHT HEART CATHETERIZATION WITH CORONARY ANGIOGRAM;   Surgeon: Felicia Hazel, MD;  Location: [facility] C[facility];  Service: Cardiovascular;  Laterality: N/A;   OOPHORECTOMY     removed 1 ovary and 1 ovary remains.   POLYPECTOMY  07/01/2019   Procedure: POLYPECTOMY;  Surgeon: Felicia Bali, MD;  [facility];  Service: Endoscopy;;   STONE EXTRACTION WITH BASKET Left 09/08/2021   Procedure: STONE EXTRACTION WITH BASKET;  Surgeon: Felicia Gauze, MD;  Location: [facility];  Service: Urology;  Laterality: Left;   STONE EXTRACTION WITH BASKET Right 10/27/2021   Procedure: STONE EXTRACTION WITH BASKET;  Surgeon: Felicia Gauze, MD;  Location: [facility];  Service: Urology;  Laterality: Right;   ureteral extraction of kidney stone      Home Medications:  Allergies as of 09/20/2022       Reactions   Levaquin [levofloxacin In D5w] Diarrhea   Penicillins Rash        Medication List        Accurate as of September 20, 2022  8:46 AM. If you have any questions, ask your nurse or doctor.          acetaminophen 500 MG tablet Commonly known as: TYLENOL Take 1,000 mg by mouth every 6 (six) hours as needed for moderate pain or headache.   albuterol 108 (90 Base) MCG/ACT inhaler Commonly known as: VENTOLIN HFA Inhale 2 puffs into the lungs every 6 (six) hours as needed for wheezing or shortness of breath.   amLODipine 10 MG tablet Commonly known as: NORVASC Take 1 tablet (10 mg total) by mouth daily.   apixaban 5 MG Tabs tablet Commonly known as: ELIQUIS Take 1 tablet (5 mg total) by mouth 2 (two) times daily. START AFTER STARTER PACK   bisoprolol 10 MG tablet Commonly known as: ZEBETA Take 1 tablet (10 mg total) by mouth daily.   Entresto 97-103 MG Generic drug: sacubitril-valsartan Take 1 tablet by mouth twice daily   Gemtesa 75 MG Tabs Generic drug: Vibegron Take 1 capsule by mouth daily.   glucose blood test strip Commonly known as: OneTouch Verio Use as instructed   OneTouch Delica Lancets Fine Misc Use to check  BG once daily   oxyCODONE-acetaminophen 5-325 MG tablet Commonly known as: Percocet Take 1 tablet by mouth every 4 (four) hours as needed for severe pain.   rosuvastatin 10 MG tablet Commonly known as: Crestor Take 1 tablet (10 mg total) by mouth daily.        Allergies:  Allergies  Allergen Reactions   Levaquin [Levofloxacin In D5w] Diarrhea   Penicillins Rash    Family History: Family History  Problem Relation Age of Onset   Hypertension Mother    Arthritis Mother    CAD Mother 36   Hip fracture Mother 26   COPD Father    Crohn's disease Sister    Cancer Sister        lung   Early death Brother 69       house fire   Diabetes Maternal Grandmother    Hypertension Sister    Hypertension Brother    Colon cancer Neg Hx     Social History:  reports that she has never smoked. She has never used smokeless tobacco. She reports current alcohol use. She reports  that she does not use drugs.  ROS: All other review of systems were reviewed and are negative except what is noted above in HPI  Physical Exam: BP (!) 171/84   Pulse 76   Ht 5\' 4"  (1.626 m)   Wt 152 lb (68.9 kg)   LMP 01/22/1981 (LMP Unknown)   BMI 26.09 kg/m   Constitutional:  Alert and oriented, No acute distress. HEENT: [place] AT, moist mucus membranes.  Trachea midline, no masses. Cardiovascular: No clubbing, cyanosis, or edema. Respiratory: Normal respiratory effort, no increased work of breathing. GI: Abdomen is soft, nontender, nondistended, no abdominal masses GU: No CVA tenderness.  Lymph: No cervical or inguinal lymphadenopathy. Skin: No rashes, bruises or suspicious lesions. Neurologic: Grossly intact, no focal deficits, moving all 4 extremities. Psychiatric: Normal mood and affect.  Laboratory Data: Lab Results  Component Value Date   WBC 8.2 05/05/2021   HGB 14.5 05/05/2021   HCT 43.6 05/05/2021   MCV 87 05/05/2021   PLT 268 05/05/2021    Lab Results  Component Value Date   CREATININE  0.84 10/25/2021    No results found for: "PSA"  No results found for: "TESTOSTERONE"  Lab Results  Component Value Date   HGBA1C 7.1 (H) 10/25/2021    Urinalysis    Component Value Date/Time   COLORURINE YELLOW 12/16/2013 1120   APPEARANCEUR Clear 12/09/2021 1119   LABSPEC 1.013 12/16/2013 1120   PHURINE 6.5 12/16/2013 1120   GLUCOSEU Trace (A) 12/09/2021 1119   HGBUR MODERATE (A) 12/16/2013 1120   BILIRUBINUR Negative 12/09/2021 1119   KETONESUR NEGATIVE 12/16/2013 1120   PROTEINUR 3+ (A) 12/09/2021 1119   PROTEINUR 30 (A) 12/16/2013 1120   UROBILINOGEN negative 07/24/2014 1519   UROBILINOGEN 0.2 12/16/2013 1120   NITRITE Negative 12/09/2021 1119   NITRITE NEGATIVE 12/16/2013 1120   LEUKOCYTESUR 3+ (A) 12/09/2021 1119    Lab Results  Component Value Date   LABMICR See below: 12/09/2021   WBCUA >30 (A) 12/09/2021   LABEPIT 0-10 12/09/2021   MUCUS Present 12/09/2021   BACTERIA Few 12/09/2021    Pertinent Imaging: KUB 09/19/22: Images reviewed and discussed with the patient  Results for orders placed during the hospital encounter of 07/13/21  DG Abd 1 View  Narrative CLINICAL DATA:  Post lithotripsy with continued left flank pain.  EXAM: ABDOMEN - 1 VIEW  COMPARISON:  None.  FINDINGS: The bowel gas pattern is normal.  Right renal calculi are unchanged largest measuring 2.4 cm in transverse dimension.  Numerous left renal calculi are again seen measuring up to 1 cm. The majority are unchanged and measuring up to 1 cm. Overall, slightly decreased in size and number.  Osseous structures are stable.  IMPRESSION: 1. Left renal calculi have mildly decreased in size and number. 2. Stable right renal calculi.   Electronically Signed By: Darliss Cheney M.D. On: 07/14/2021 17:12  No results found for this or any previous visit.  No results found for this or any previous visit.  No results found for this or any previous visit.  Results for orders  placed during the hospital encounter of 01/25/22  Ultrasound renal complete  Narrative CLINICAL DATA:  Nephrolithiasis.  EXAM: RENAL / URINARY TRACT ULTRASOUND COMPLETE  COMPARISON:  None Available.  FINDINGS: Right Kidney:  Renal measurements: 10.7 x 5.9 x 4.8 cm = volume: 160 mL. Contains a 12.8 mm nonobstructive stone. Contains 1.6 cm cyst. No follow-up imaging recommended for the cyst.  Left Kidney:  Renal measurements: 30.0 x  7.1 x 5.1 cm = volume: 244.6 mL. Contains a 7 mm nonobstructive stone. Contains 2 cysts measuring 1.9 and 0.7 cm. No follow-up imaging recommended for the cysts.  Bladder:  Appears normal for degree of bladder distention.  Other:  None.  IMPRESSION: Nonobstructive stones in both kidneys. No other significant abnormalities.   Electronically Signed By: Gerome Sam III M.D. On: 01/26/2022 11:08  No valid procedures specified. Results for orders placed in visit on 05/18/21  CT HEMATURIA WORKUP  Narrative CLINICAL DATA:  Hematuria over the last 3 weeks. History of diabetes and hypertension. Cystitis. Urinary incontinence.  EXAM: CT ABDOMEN AND PELVIS WITHOUT AND WITH CONTRAST  TECHNIQUE: Multidetector CT imaging of the abdomen and pelvis was performed following the standard protocol before and following the bolus administration of intravenous contrast.  RADIATION DOSE REDUCTION: This exam was performed according to the departmental dose-optimization program which includes automated exposure control, adjustment of the mA and/or kV according to patient size and/or use of iterative reconstruction technique.  CONTRAST:  OMNIPAQUE IOHEXOL 300 MG/ML  SOLN  COMPARISON:  CT abdomen 10/04/2001  FINDINGS: Lower chest: Mild scarring in the right middle lobe, right lower lobe, and lingula. 4 by 3 mm pleural-based nodule along the left lower lobe on image 61 series 15, not changed from 05/11/2020. Small to moderate-sized  hiatal hernia. Mild cardiomegaly. Descending thoracic aortic atherosclerosis.  Hepatobiliary: Contracted gallbladder.  Otherwise unremarkable.  Pancreas: Unremarkable  Spleen: Unremarkable  Adrenals/Urinary Tract: Both adrenal glands appear normal.  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 A branching 2.2 by 1.7 cm collecting system staghorn calculus in the right renal pelvis is associated with lower pole and mid kidney caliectasis  2.5 by 1.8 cm filling defect in the right mid to lower kidney infundibulum and calyx proximal to the stone on image 56 series 16 does not appear to enhance and accordingly favors plan thrombus over tumor. Similar filling defect is observed in much of the anterior kidney collecting system as shown for example on image 37 series 13. Within the blunted calices the presumed clot as outlined by excreted contrast.  There calcifications near the distal ureters but not within the distal ureters them cells. A separate right renal calculus is nonobstructive and measures 3 mm in diameter on image 34 series 2.  Which like the contralateral side, there is a staghorn calculus in the left mid kidney collecting system abutting the UPJ, measuring 1.7 cm in long axis. Release 5 additional nonobstructive renal calculi are present in the left kidney. There is no overt left hydronephrosis nor do we demonstrate additional casting filling defects in the left kidney. There is some mild left caliectasis particularly in upper pole example on image 32 series 13.  There is abnormal wall thickening in the right proximal ureter extending along 4.3 cm from the stone, possibly from inflammatory wall thickening, less likely to be from thrombus or tumor. Similar appearance on the left extending along 3.4 cm distal to the collecting system calculus in the left proximal ureter with only a thin channel of contrast in this vicinity.  Urinary bladder  unremarkable.  Stomach/Bowel: Hiatal hernia.  Sigmoid colon diverticulosis.  Vascular/Lymphatic: Atherosclerosis is present, including aortoiliac atherosclerotic disease and atherosclerotic calcifications in branches of the abdominal aorta.  Reproductive: Uterus absent.  Adnexa unremarkable.  Other: No supplemental non-categorized findings.  Musculoskeletal: Thoracic and lumbar spondylosis and degenerative disc disease.  IMPRESSION: 1. Bilateral staghorn calculi in the collecting systems extending to the UPJ on both sides.  These are associated with abnormal thickening of the proximal ureters bilaterally, as well as mild bilateral hydronephrosis. 2. In the right renal collecting system, the large staghorn calculus is associated with abnormal noncalcified filling defect in anterior infundibula and calices probably from blood clot given the fact that this abnormal density filling the infundibulum and calices is not appreciably enhancing. Difficult to exclude small amounts of tumor in this region given the substantially abnormal appearance. See for example image 37 series 13. 3. Renal atrophy and scarring with renal sinus lipomatosis. 4. Additional nonobstructive renal calculi are present bilaterally. 5. Stable 4 by 3 mm pleural-based nodule in the left lower lobe, not changed over the last year, thought to likely be benign. 6. Mild bibasilar scarring. 7. Other imaging findings of potential clinical significance: Small to moderate-sized hiatal hernia. Mild cardiomegaly. Sigmoid colon diverticulosis. Aortic Atherosclerosis (ICD10-I70.0). Systemic atherosclerosis. Thoracolumbar spondylosis and degenerative disc disease.   Electronically Signed By: Gaylyn Rong M.D. On: 05/23/2021 14:17  Results for orders placed during the hospital encounter of 12/02/21  CT RENAL STONE STUDY  Narrative CLINICAL DATA:  Nephrolithiasis, RIGHT flank pain for 4-5 months, history kidney  stones, lithotripsy, stenting  EXAM: CT ABDOMEN AND PELVIS WITHOUT CONTRAST  TECHNIQUE: Multidetector CT imaging of the abdomen and pelvis was performed following the standard protocol without IV contrast.  RADIATION DOSE REDUCTION: This exam was performed according to the departmental dose-optimization program which includes automated exposure control, adjustment of the mA and/or kV according to patient size and/or use of iterative reconstruction technique.  COMPARISON:  05/23/2021  FINDINGS: Lower chest: Minimal subsegmental atelectasis at lung bases  Hepatobiliary: Gallbladder and liver normal appearance  Pancreas: Atrophic pancreas without mass  Spleen: Normal appearance  Adrenals/Urinary Tract: Adrenal thickening without mass. BILATERAL cortical thinning. BILATERAL nonobstructing renal calculi up to 9 mm RIGHT in 5 mm LEFT. RIGHT ureteral stent. No hydronephrosis or definite renal mass. Questionable tiny calculus adjacent to the mid to distal RIGHT ureteral stent. Bladder unremarkable.  Stomach/Bowel: Normal appendix. Scattered stool in colon. Distal colonic diverticulosis without evidence of diverticulitis. Moderate-sized hiatal hernia. Remaining bowel loops unremarkable.  Vascular/Lymphatic: Atherosclerotic calcifications aorta, iliac arteries, coronary arteries. Aorta normal caliber. No adenopathy.  Reproductive: Uterus surgically absent. Unremarkable LEFT ovary with nonvisualization of RIGHT ovary.  Other: No free air or free fluid. No hernia or inflammatory process.  Musculoskeletal: Osseous demineralization.  IMPRESSION: BILATERAL nonobstructing renal calculi.  BILATERAL renal cortical atrophy.  RIGHT ureteral stent without hydronephrosis or hydroureter, though there is questionably a tiny calculus adjacent to the mid to distal portion of the RIGHT ureteral stent.  Aortic Atherosclerosis (ICD10-I70.0).   Electronically Signed By: Ulyses Southward  M.D. On: 12/02/2021 10:17   Assessment & Plan:    1.  Urinary incontinence, unspecified type -mirabegron 25mg  daily - BLADDER SCAN AMB NON-IMAGING  2. Acute cystitis without hematuria Urine for culture Macrobid 100mg  BID fro 7 days  3. Nephrolithiasis Followup 6 months with KUB   No follow-ups on file.  Wilkie Aye, MD  [facility] [place]

## 2022-09-20 NOTE — Progress Notes (Signed)
post void residual = 0 ml

## 2022-09-22 LAB — URINE CULTURE

## 2022-09-26 ENCOUNTER — Telehealth: Payer: Self-pay

## 2022-09-26 ENCOUNTER — Encounter: Payer: Self-pay | Admitting: Urology

## 2022-09-26 NOTE — Telephone Encounter (Signed)
-----   Message from Malen Gauze, MD sent at 09/26/2022  9:55 AM EDT ----- Continue maxcrobid ----- Message ----- From: Grier Rocher, CMA Sent: 09/22/2022   1:09 PM EDT To: Malen Gauze, MD  Please review, pt started on macrobid on 06/12

## 2022-09-26 NOTE — Patient Instructions (Signed)

## 2022-09-26 NOTE — Telephone Encounter (Signed)
Patient aware of MD response to urine culture. 

## 2022-10-20 DIAGNOSIS — H5213 Myopia, bilateral: Secondary | ICD-10-CM | POA: Diagnosis not present

## 2022-10-23 ENCOUNTER — Emergency Department (HOSPITAL_COMMUNITY): Payer: Medicare HMO

## 2022-10-23 ENCOUNTER — Emergency Department (HOSPITAL_COMMUNITY)
Admission: EM | Admit: 2022-10-23 | Discharge: 2022-10-23 | Disposition: A | Discharge status: Home / Self Care | Payer: Medicare HMO | Attending: Emergency Medicine | Admitting: Emergency Medicine

## 2022-10-23 ENCOUNTER — Encounter (HOSPITAL_COMMUNITY): Payer: Self-pay | Admitting: Emergency Medicine

## 2022-10-23 ENCOUNTER — Other Ambulatory Visit: Payer: Self-pay

## 2022-10-23 DIAGNOSIS — Z8673 Personal history of transient ischemic attack (TIA), and cerebral infarction without residual deficits: Secondary | ICD-10-CM | POA: Diagnosis not present

## 2022-10-23 DIAGNOSIS — J45909 Unspecified asthma, uncomplicated: Secondary | ICD-10-CM | POA: Insufficient documentation

## 2022-10-23 DIAGNOSIS — I110 Hypertensive heart disease with heart failure: Secondary | ICD-10-CM | POA: Diagnosis not present

## 2022-10-23 DIAGNOSIS — I6782 Cerebral ischemia: Secondary | ICD-10-CM | POA: Diagnosis not present

## 2022-10-23 DIAGNOSIS — R9431 Abnormal electrocardiogram [ECG] [EKG]: Secondary | ICD-10-CM | POA: Diagnosis not present

## 2022-10-23 DIAGNOSIS — Z7901 Long term (current) use of anticoagulants: Secondary | ICD-10-CM | POA: Insufficient documentation

## 2022-10-23 DIAGNOSIS — I5022 Chronic systolic (congestive) heart failure: Secondary | ICD-10-CM | POA: Diagnosis not present

## 2022-10-23 DIAGNOSIS — E119 Type 2 diabetes mellitus without complications: Secondary | ICD-10-CM | POA: Insufficient documentation

## 2022-10-23 DIAGNOSIS — R42 Dizziness and giddiness: Secondary | ICD-10-CM

## 2022-10-23 DIAGNOSIS — G459 Transient cerebral ischemic attack, unspecified: Secondary | ICD-10-CM | POA: Diagnosis not present

## 2022-10-23 DIAGNOSIS — R7989 Other specified abnormal findings of blood chemistry: Secondary | ICD-10-CM | POA: Insufficient documentation

## 2022-10-23 LAB — BASIC METABOLIC PANEL
Anion gap: 9 (ref 5–15)
BUN: 26 mg/dL — ABNORMAL HIGH (ref 8–23)
CO2: 24 mmol/L (ref 22–32)
Calcium: 9.1 mg/dL (ref 8.9–10.3)
Chloride: 107 mmol/L (ref 98–111)
Creatinine, Ser: 1.21 mg/dL — ABNORMAL HIGH (ref 0.44–1.00)
GFR, Estimated: 46 mL/min — ABNORMAL LOW (ref >60)
Glucose, Bld: 109 mg/dL — ABNORMAL HIGH (ref 70–99)
Potassium: 3.6 mmol/L (ref 3.5–5.1)
Sodium: 140 mmol/L (ref 135–145)

## 2022-10-23 LAB — CBC
HCT: 44.1 % (ref 36.0–46.0)
Hemoglobin: 14.1 g/dL (ref 12.0–15.0)
MCH: 29.1 pg (ref 26.0–34.0)
MCHC: 32 g/dL (ref 30.0–36.0)
MCV: 91.1 fL (ref 80.0–100.0)
Platelets: 227 10*3/uL (ref 150–400)
RBC: 4.84 MIL/uL (ref 3.87–5.11)
RDW: 13.1 % (ref 11.5–15.5)
WBC: 8.5 10*3/uL (ref 4.0–10.5)
nRBC: 0 % (ref 0.0–0.2)

## 2022-10-23 LAB — PROTIME-INR
INR: 1 (ref 0.8–1.2)
Prothrombin Time: 12.9 seconds (ref 11.4–15.2)

## 2022-10-23 NOTE — ED Notes (Signed)
PA-C made aware of BP of 168/131, also made aware that pt did not take her routine Rx BP medication

## 2022-10-23 NOTE — ED Triage Notes (Signed)
Pt via POV c/o dizziness and a syncopal episode yesterday. PMH includes CVA in 2015 and current DVT per pt report. She says she has blurry vision and feels unsteady on her feet. No pain.

## 2022-10-23 NOTE — Discharge Instructions (Signed)
No concerning findings on blood work, or the MRI brain.  Follow-up with neurology.  Have given you referral listed above.  Follow-up with your primary care provider.  For any concerning symptoms return to the emergency room.

## 2022-10-23 NOTE — ED Provider Notes (Signed)
[place] EMERGENCY DEPARTMENT AT [facility] Provider Note   CSN: [id number] Arrival date & time: 10/23/22  1245     History  Chief Complaint  Patient presents with   Dizziness    [name] is a 77 y.o. female.  Pt complains of dizziness, unsteadiness since yesterday.  No new vision change, speech difficulty, or unilateral weakness.  Has history of CVA.  Has current DVT being treated with Eliquis.  Friend that is at bedside states patient otherwise appears at baseline.  Because of patient's history of CVA patient would like to ensure that she is not having another CVA.  She denies any chest pain, or shortness of breath.  The history is provided by the patient. No language interpreter was used.       Home Medications Prior to Admission medications   Medication Sig Start Date End Date Taking? Authorizing Provider  acetaminophen (TYLENOL) 500 MG tablet Take 1,000 mg by mouth every 6 (six) hours as needed for moderate pain or headache.    [provider]  albuterol (VENTOLIN HFA) 108 (90 Base) MCG/ACT inhaler Inhale 2 puffs into the lungs every 6 (six) hours as needed for wheezing or shortness of breath. 04/30/20   Daphine Deutscher, Mary-Margaret, FNP  amLODipine (NORVASC) 10 MG tablet Take 1 tablet (10 mg total) by mouth daily. 05/09/21   Junie Spencer, FNP  apixaban (ELIQUIS) 5 MG TABS tablet Take 1 tablet (5 mg total) by mouth 2 (two) times daily. START AFTER STARTER PACK 12/26/21   Jannifer Rodney A, FNP  bisoprolol (ZEBETA) 10 MG tablet Take 1 tablet (10 mg total) by mouth daily. 06/09/21   Jannifer Rodney A, FNP  glucose blood (ONETOUCH VERIO) test strip Use as instructed 12/24/13   Jannifer Rodney A, FNP  mirabegron ER (MYRBETRIQ) 25 MG TB24 tablet Take 1 tablet (25 mg total) by mouth daily. 09/20/22   McKenzie, Mardene Celeste, MD  nitrofurantoin, macrocrystal-monohydrate, (MACROBID) 100 MG capsule Take 1 capsule (100 mg total) by mouth every 12 (twelve) hours. 09/20/22    McKenzie, Mardene Celeste, MD  [facility] DELICA LANCETS FINE MISC Use to check BG once daily 01/19/14   Eckard, Tammy, RPH-CPP  oxyCODONE-acetaminophen (PERCOCET) 5-325 MG tablet Take 1 tablet by mouth every 4 (four) hours as needed for severe pain. 09/20/22 09/20/23  Malen Gauze, MD  rosuvastatin (CRESTOR) 10 MG tablet Take 1 tablet (10 mg total) by mouth daily. 05/06/21   Junie Spencer, FNP  sacubitril-valsartan (ENTRESTO) 97-103 MG Take 1 tablet by mouth twice daily 04/27/22   Rollene Rotunda, MD  Vibegron (GEMTESA) 75 MG TABS Take 1 capsule by mouth daily. 10/12/21   McKenzie, Mardene Celeste, MD  escitalopram (LEXAPRO) 20 MG tablet Take 1 tablet (20 mg total) by mouth daily. Patient not taking: Reported on 05/10/2020 02/07/19 05/11/20  Junie Spencer, FNP      Allergies    Levaquin [levofloxacin in d5w] and Penicillins    Review of Systems   Review of Systems  Constitutional:  Negative for fever.  Eyes:  Negative for visual disturbance.  Respiratory:  Negative for shortness of breath.   Cardiovascular:  Negative for chest pain.  Neurological:  Positive for light-headedness. Negative for headaches.  All other systems reviewed and are negative.   Physical Exam Updated Vital Signs BP (!) 196/95 (BP Location: Right Arm)   Pulse 69   Temp 98.1 F (36.7 C) (Oral)   Resp 14   Ht 5\' 4"  (1.626 m)  Wt 68 kg   LMP 01/22/1981 (LMP Unknown)   SpO2 100%   BMI 25.75 kg/m  Physical Exam Vitals and nursing note reviewed.  Constitutional:      General: She is not in acute distress.    Appearance: Normal appearance. She is not ill-appearing.  HENT:     Head: Normocephalic and atraumatic.     Nose: Nose normal.  Eyes:     General: No scleral icterus.    Extraocular Movements: Extraocular movements intact.     Conjunctiva/sclera: Conjunctivae normal.  Cardiovascular:     Rate and Rhythm: Normal rate and regular rhythm.     Heart sounds: Normal heart sounds.  Pulmonary:     Effort:  Pulmonary effort is normal. No respiratory distress.     Breath sounds: Normal breath sounds. No wheezing or rales.  Abdominal:     General: There is no distension.     Tenderness: There is no abdominal tenderness.  Musculoskeletal:        General: Normal range of motion.     Cervical back: Normal range of motion.  Skin:    General: Skin is warm and dry.  Neurological:     General: No focal deficit present.     Mental Status: She is alert and oriented to person, place, and time. Mental status is at baseline.     Comments: Full range of motion in bilateral upper and lower extremities with 5/5 strength in extension Fleximate groups.  Cranial nerves III through XII intact.  No facial droop.  No pronator drift.  Normal speech.  Tongue midline.     ED Results / Procedures / Treatments   Labs (all labs ordered are listed, but only abnormal results are displayed) Labs Reviewed  BASIC METABOLIC PANEL - Abnormal; Notable for the following components:      Result Value   Glucose, Bld 109 (*)    BUN 26 (*)    Creatinine, Ser 1.21 (*)    GFR, Estimated 46 (*)    All other components within normal limits  CBC  PROTIME-INR  URINALYSIS, ROUTINE W REFLEX MICROSCOPIC  CBG MONITORING, ED    EKG EKG Interpretation Date/Time:  Monday October 23 2022 13:02:23 EDT Ventricular Rate:  68 PR Interval:  182 QRS Duration:  98 QT Interval:  382 QTC Calculation: 406 R Axis:   266  Text Interpretation: Normal sinus rhythm Right superior axis deviation Incomplete right bundle branch block Anterior infarct , age undetermined T wave abnormality, consider lateral ischemia Abnormal ECG When compared with ECG of 11-Aug-2021 10:46, Incomplete right bundle branch block is now Present T wave inversion less evident in Anterolateral leads Confirmed by Meridee Score [phone]) on 10/23/2022 3:05:41 PM  Radiology No results found.  Procedures Procedures    Medications Ordered in ED Medications - No data to  display  ED Course/ Medical Decision Making/ A&P Clinical Course as of 10/23/22 1742  Mon Oct 23, 2022  1707 MR BRAIN WO CONTRAST [AA]    Clinical Course User Index [AA] Marita Kansas, PA-C                             Medical Decision Making Amount and/or Complexity of Data Reviewed Labs: ordered. Radiology: ordered. Decision-making details documented in ED Course.   Medical Decision Making / ED Course   This patient presents to the ED for concern of feeling off balance, this involves an extensive number of treatment options,  and is a complaint that carries with it a high risk of complications and morbidity.  The differential diagnosis includes CVA, TIA, dehydration, orthostasis  MDM: 77 year old female presents with above-mentioned complaints.  Overall she is well-appearing.  Neuroexam is without focal deficits.  Denies any vision change but endorses feeling off balance.  Blood work obtained.  CBC is unremarkable.  BMP shows mild renal insufficiency with elevated BUN otherwise without acute concern.  MRI brain obtained.  Shows remote lacunar infarcts otherwise no acute CVA.  EKG without acute ischemic changes or arrhythmia.  I offered patient IV hydration but she defers.  She will drink plenty of fluids p.o.  She will follow-up closely with her PCP.  Neurology referral given.  Patient discussed with attending.  Patient is appropriate for discharge.  Discharged in stable condition.   Lab Tests: -I ordered, reviewed, and interpreted labs.   The pertinent results include:   Labs Reviewed  BASIC METABOLIC PANEL - Abnormal; Notable for the following components:      Result Value   Glucose, Bld 109 (*)    BUN 26 (*)    Creatinine, Ser 1.21 (*)    GFR, Estimated 46 (*)    All other components within normal limits  CBC  PROTIME-INR  URINALYSIS, ROUTINE W REFLEX MICROSCOPIC  CBG MONITORING, ED      EKG  EKG Interpretation Date/Time:  Monday October 23 2022 13:02:23 EDT Ventricular  Rate:  68 PR Interval:  182 QRS Duration:  98 QT Interval:  382 QTC Calculation: 406 R Axis:   266  Text Interpretation: Normal sinus rhythm Right superior axis deviation Incomplete right bundle branch block Anterior infarct , age undetermined T wave abnormality, consider lateral ischemia Abnormal ECG When compared with ECG of 11-Aug-2021 10:46, Incomplete right bundle branch block is now Present T wave inversion less evident in Anterolateral leads Confirmed by Meridee Score [phone]) on 10/23/2022 3:05:41 PM         Imaging Studies ordered: I ordered imaging studies including MRI brain wo contrast I independently visualized and interpreted imaging. I agree with the radiologist interpretation   Medicines ordered and prescription drug management: No orders of the defined types were placed in this encounter.   -I have reviewed the patients home medicines and have made adjustments as needed   Reevaluation: After the interventions noted above, I reevaluated the patient and found that they have :stayed the same  Co morbidities that complicate the patient evaluation  Past Medical History:  Diagnosis Date   Asthma    Chronic systolic heart failure (HCC)    Echo (08/26/13):  Mild LVH. EF 25% to 30%. Diffuse HK. Aortic valve: There was trivial regurgitation. Mitral valve: There was mild regurgitation. Left atrium: The atrium was mildly dilated.   GERD (gastroesophageal reflux disease)    Headache(784.0)    History of kidney stones    History of nephrolithiasis    Hypertension    NICM (nonischemic cardiomyopathy) (HCC)    a. LHC (08/2013):  no CAD   Sleep apnea    does not use machine   Stroke (HCC)    Type II or unspecified type diabetes mellitus without mention of complication, uncontrolled    borderline      Dispostion: Patient discharged in stable condition.  Neurology referral given.  She will follow-up with PCP.  Final Clinical Impression(s) / ED Diagnoses Final  diagnoses:  Dysequilibrium    Rx / DC Orders ED Discharge Orders  Ordered    Ambulatory referral to Neurology       Comments: An appointment is requested in approximately: 2 weeks   10/23/22 1746              Marita Kansas, PA-C 10/23/22 1746    Terrilee Files, MD 10/24/22 [phone]

## 2022-10-23 NOTE — ED Notes (Signed)
Pt ambulatory to waiting room. Pt verbalized understanding of discharge instructions.   

## 2022-10-23 NOTE — ED Notes (Signed)
 Pt is currently in MRI

## 2022-10-23 NOTE — ED Provider Triage Note (Signed)
Emergency Medicine Provider Triage Evaluation Note  KLOEE BALLEW , a 77 y.o. female  was evaluated in triage.  Pt complains of dizziness, unsteadiness since yesterday.  No new vision change, speech difficulty, or unilateral weakness.  Has history of CVA.  Has current DVT being treated with Eliquis.  Review of Systems  Positive: As above Negative: As above  Physical Exam  BP (!) 196/95 (BP Location: Right Arm)   Pulse 69   Temp 98.1 F (36.7 C) (Oral)   Resp 14   Ht 5\' 4"  (1.626 m)   Wt 68 kg   LMP 01/22/1981 (LMP Unknown)   SpO2 100%   BMI 25.75 kg/m  Gen:   Awake, no distress   Resp:  Normal effort  MSK:   Moves extremities without difficulty  Other:    Medical Decision Making  Medically screening exam initiated at 2:26 PM.  Appropriate orders placed.  LAMYA LAUSCH was informed that the remainder of the evaluation will be completed by another provider, this initial triage assessment does not replace that evaluation, and the importance of remaining in the ED until their evaluation is complete.  MRI brain ordered   Marita Kansas, PA-C 10/23/22 1428

## 2022-10-23 NOTE — ED Notes (Signed)
Pt states she has a Hx of hypertension and does take medication for it, but has not taken her BP medication today

## 2022-10-24 ENCOUNTER — Encounter: Payer: Self-pay | Admitting: Cardiology

## 2022-10-24 ENCOUNTER — Encounter: Payer: Self-pay | Admitting: Neurology

## 2022-10-25 ENCOUNTER — Telehealth: Payer: Self-pay | Admitting: Cardiology

## 2022-10-25 NOTE — Telephone Encounter (Signed)
Patient states she would like to see Dr. Antoine Poche today if at all possible in [place]. She sent MyChart message yesterday 07/16 and would like to discuss this further. Please advise.

## 2022-10-25 NOTE — Telephone Encounter (Signed)
Spoke with pt, aware of dr hochrein's recommendations. She has an appointment with neuro 12/06/22.

## 2022-10-25 NOTE — Telephone Encounter (Signed)
Patient sent My Chart message yesterday.  Advised that the Doctor is in hospital and waiting for his response.  She is concerned and wanting to know if test or appt. Needs to be done.  Please advise.

## 2022-10-26 NOTE — Progress Notes (Unsigned)
Name: [name] DOB: 05-Oct-1945 MRN: [id number]  History of Present Illness: [name] is a 77 y.o. female who presents today for follow up visit at [facility] [place]. - GU History: 1. Kidney stones. - 09/19/2022: KUB showed bilateral renal calculi.   At last visit with Dr. Ronne Binning on 09/20/2022: - UA concerning for infection. Macrobid 100 mg 2x/day x7 days prescribed.  - Reported bothersome urinary urgency and frequency. Myrbetriq 25 mg daily prescribed.  - Advised KUB in 6 months for stone surveillance.  Since last visit: - Urine culture from 09/20/2022 came back negative.  Today: She denies significant symptomatic improvement since starting Myrbetriq 25 mg daily. She reports ongoing urinary frequency, nocturia (2x/night), urgency, and urge incontinence.  She leaks 4-5 times per day; reports wearing diapers per day. She reports routine caffeine intake (2 cups of coffee / around 8 oz of tea).   She denies dysuria, gross hematuria, straining to void, or sensations of incomplete emptying. Reports occasional low midline abdominal pain. Denies flank pain.    Fall Screening: Do you usually have a device to assist in your mobility? No   Medications: Current Outpatient Medications  Medication Sig Dispense Refill   acetaminophen (TYLENOL) 500 MG tablet Take 1,000 mg by mouth every 6 (six) hours as needed for moderate pain or headache.     albuterol (VENTOLIN HFA) 108 (90 Base) MCG/ACT inhaler Inhale 2 puffs into the lungs every 6 (six) hours as needed for wheezing or shortness of breath. 18 g 0   amLODipine (NORVASC) 10 MG tablet Take 1 tablet (10 mg total) by mouth daily. 90 tablet 2   apixaban (ELIQUIS) 5 MG TABS tablet Take 1 tablet (5 mg total) by mouth 2 (two) times daily. START AFTER STARTER PACK 60 tablet 3   bisoprolol (ZEBETA) 10 MG tablet Take 1 tablet (10 mg total) by mouth daily. 90 tablet 1   glucose blood (ONETOUCH VERIO) test strip Use as instructed  100 each 12   nitrofurantoin, macrocrystal-monohydrate, (MACROBID) 100 MG capsule Take 1 capsule (100 mg total) by mouth every 12 (twelve) hours. 14 capsule 0   ONETOUCH DELICA LANCETS FINE MISC Use to check BG once daily 100 each 2   oxyCODONE-acetaminophen (PERCOCET) 5-325 MG tablet Take 1 tablet by mouth every 4 (four) hours as needed for severe pain. 30 tablet 0   rosuvastatin (CRESTOR) 10 MG tablet Take 1 tablet (10 mg total) by mouth daily. 90 tablet 3   sacubitril-valsartan (ENTRESTO) 97-103 MG Take 1 tablet by mouth twice daily 90 tablet 3   mirabegron ER (MYRBETRIQ) 25 MG TB24 tablet Take 2 tablets (50 mg total) by mouth daily. 60 tablet 11   No current facility-administered medications for this visit.    Allergies: Allergies  Allergen Reactions   Levaquin [Levofloxacin In D5w] Diarrhea   Penicillins Rash    Past Medical History:  Diagnosis Date   Asthma    Chronic systolic heart failure (HCC)    Echo (08/26/13):  Mild LVH. EF 25% to 30%. Diffuse HK. Aortic valve: There was trivial regurgitation. Mitral valve: There was mild regurgitation. Left atrium: The atrium was mildly dilated.   GERD (gastroesophageal reflux disease)    Headache(784.0)    History of kidney stones    History of nephrolithiasis    Hypertension    NICM (nonischemic cardiomyopathy) (HCC)    a. LHC (08/2013):  no CAD   Sleep apnea    does not use machine   Stroke (HCC)  Type II or unspecified type diabetes mellitus without mention of complication, uncontrolled    borderline   Past Surgical History:  Procedure Laterality Date   ABDOMINAL HYSTERECTOMY     COLONOSCOPY WITH PROPOFOL N/A 07/01/2019   Procedure: COLONOSCOPY WITH PROPOFOL;  Surgeon: West Bali, MD;  [facility]: [facility];  Service: Endoscopy;  Laterality: N/A;  2:15pm - pt will not move up   CYSTOSCOPY WITH RETROGRADE PYELOGRAM, URETEROSCOPY AND STENT PLACEMENT Left 08/11/2021   Procedure: CYSTOSCOPY WITH RETROGRADE PYELOGRAM,  URETEROSCOPY AND STENT PLACEMENT;  Surgeon: Malen Gauze, MD;  Location: [facility];  Service: Urology;  Laterality: Left;   CYSTOSCOPY WITH RETROGRADE PYELOGRAM, URETEROSCOPY AND STENT PLACEMENT Left 09/08/2021   Procedure: CYSTOSCOPY WITH RETROGRADE PYELOGRAM, URETEROSCOPY AND STENT EXCHANGE;  Surgeon: Malen Gauze, MD;  Location: [facility];  Service: Urology;  Laterality: Left;   CYSTOSCOPY WITH RETROGRADE PYELOGRAM, URETEROSCOPY AND STENT PLACEMENT Right 10/06/2021   Procedure: CYSTOSCOPY WITH RETROGRADE PYELOGRAM, URETEROSCOPY AND STENT PLACEMENT;  Surgeon: Malen Gauze, MD;  Location: [facility];  Service: Urology;  Laterality: Right;   CYSTOSCOPY WITH RETROGRADE PYELOGRAM, URETEROSCOPY AND STENT PLACEMENT Right 10/27/2021   Procedure: CYSTOSCOPY WITH RETROGRADE PYELOGRAM, URETEROSCOPY AND STENT EXCHANGE;  Surgeon: Malen Gauze, MD;  Location: [facility];  Service: Urology;  Laterality: Right;   EXTRACORPOREAL SHOCK WAVE LITHOTRIPSY Left 05/31/2021   Procedure: EXTRACORPOREAL SHOCK WAVE LITHOTRIPSY (ESWL);  Surgeon: Malen Gauze, MD;  Location: [facility];  Service: Urology;  Laterality: Left;   EXTRACORPOREAL SHOCK WAVE LITHOTRIPSY Left 06/28/2021   Procedure: EXTRACORPOREAL SHOCK WAVE LITHOTRIPSY (ESWL);  Surgeon: Malen Gauze, MD;  Location: [facility];  Service: Urology;  Laterality: Left;   HOLMIUM LASER APPLICATION Left 08/11/2021   Procedure: HOLMIUM LASER APPLICATION;  Surgeon: Malen Gauze, MD;  Location: [facility];  Service: Urology;  Laterality: Left;   HOLMIUM LASER APPLICATION Left 09/08/2021   Procedure: HOLMIUM LASER APPLICATION;  Surgeon: Malen Gauze, MD;  Location: [facility];  Service: Urology;  Laterality: Left;   HOLMIUM LASER APPLICATION Right 10/06/2021   Procedure: HOLMIUM LASER APPLICATION;  Surgeon: Malen Gauze, MD;  Location: [facility];  Service: Urology;  Laterality: Right;   HOLMIUM LASER APPLICATION Right 10/27/2021   Procedure: HOLMIUM LASER  APPLICATION;  Surgeon: Malen Gauze, MD;  Location: [facility];  Service: Urology;  Laterality: Right;   LEFT AND RIGHT HEART CATHETERIZATION WITH CORONARY ANGIOGRAM N/A 09/03/2013   Procedure: LEFT AND RIGHT HEART CATHETERIZATION WITH CORONARY ANGIOGRAM;  Surgeon: Kathleene Hazel, MD;  Location: [name] CATH LAB;  Service: Cardiovascular;  Laterality: N/A;   OOPHORECTOMY     removed 1 ovary and 1 ovary remains.   POLYPECTOMY  07/01/2019   Procedure: POLYPECTOMY;  Surgeon: West Bali, MD;  [facility];  Service: Endoscopy;;   STONE EXTRACTION WITH BASKET Left 09/08/2021   Procedure: STONE EXTRACTION WITH BASKET;  Surgeon: Malen Gauze, MD;  Location: [facility];  Service: Urology;  Laterality: Left;   STONE EXTRACTION WITH BASKET Right 10/27/2021   Procedure: STONE EXTRACTION WITH BASKET;  Surgeon: Malen Gauze, MD;  Location: [facility];  Service: Urology;  Laterality: Right;   ureteral extraction of kidney stone     Family History  Problem Relation Age of Onset   Hypertension Mother    Arthritis Mother    CAD Mother 73   Hip fracture Mother 29   COPD Father    Crohn's disease Sister    Cancer  Sister        lung   Early death Brother 50       house fire   Diabetes Maternal Grandmother    Hypertension Sister    Hypertension Brother    Colon cancer Neg Hx    Social History   Socioeconomic History   Marital status: Divorced    Spouse name: Not on file   Number of children: 2   Years of education: Not on file   Highest education level: Not on file  Occupational History   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 06/19/21-Currently lives with daughter and is actively looking for a rental home.    1 son and 1 daughter. Son died in 2015/12/07.   2 grandchildren   Left handedNo caffiene   Social Determinants of Health   Financial Resource Strain: Low Risk  (09/19/2022)   Overall Financial Resource Strain (CARDIA)    Difficulty of Paying Living Expenses: Not hard at all  Food Insecurity: No Food Insecurity (09/19/2022)   Hunger Vital Sign    Worried About Running Out of Food in the Last Year: Never true    Ran Out of Food in the Last Year: Never true  Transportation Needs: No Transportation Needs (09/19/2022)   PRAPARE - Administrator, Civil Service (Medical): No    Lack of Transportation (Non-Medical): No  Physical Activity: Insufficiently Active (09/19/2022)   Exercise Vital Sign    Days of Exercise per Week: 3 days    Minutes of Exercise per Session: 30 min  Stress: No Stress Concern Present (09/19/2022)   [employer] of Occupational Health - Occupational Stress Questionnaire    Feeling of Stress : Not at all  Social Connections: Moderately Integrated (09/19/2022)   Social Connection and Isolation Panel [NHANES]    Frequency of Communication with Friends and Family: More than three times a week    Frequency of Social Gatherings with Friends and Family: More than three times a week    Attends Religious Services: More than 4 times per year    Active Member of [employer] or Organizations: Yes    Attends Engineer, structural: More than 4 times per year    Marital Status: Divorced  Intimate Partner Violence: Not At Risk (09/19/2022)   Humiliation, Afraid, Rape, and Kick questionnaire    Fear of Current or Ex-Partner: No    Emotionally Abused: No    Physically Abused: No    Sexually Abused: No    SUBJECTIVE  Review of Systems Constitutional: Patient denies any unintentional weight loss or change in strength lntegumentary:  Patient denies any rashes or pruritus Cardiovascular: Patient denies chest pain or syncope Respiratory: Patient denies shortness of breath Gastrointestinal: Patient denies nausea, vomiting, constipation, or diarrhea Musculoskeletal: Patient denies muscle cramps or weakness Neurologic: Patient denies convulsions or seizures Psychiatric: Patient denies memory problems Allergic/Immunologic: Patient denies recent allergic reaction(s) Hematologic/Lymphatic: Patient denies bleeding tendencies Endocrine: Patient denies heat/cold intolerance  GU: As per HPI.  OBJECTIVE Vitals:   10/30/22 1059  BP: (!) 145/69  Pulse: (!) 58  Temp: 98.6 F (37 C)   There is no height or weight on file to calculate BMI.  Physical Examination  Constitutional: No obvious distress; patient is non-toxic appearing  Cardiovascular: No visible lower extremity edema.  Respiratory: The patient does not have audible wheezing/stridor; respirations do not appear labored  Gastrointestinal: Abdomen non-distended Musculoskeletal: Normal ROM of UEs  Skin: No obvious rashes/open sores  Neurologic: CN 2-12 grossly intact Psychiatric: Answered questions appropriately with normal affect  Hematologic/Lymphatic/Immunologic: No obvious bruises or sites of spontaneous bleeding  UA: positive for >30 WBC/hpf, 3-10 RBC/hpf, bacteria (few) PVR: 0 ml  ASSESSMENT Urinary urgency - Plan: Urinalysis, Routine w reflex microscopic, BLADDER SCAN AMB NON-IMAGING  Urinary frequency - Plan: Urinalysis, Routine w reflex microscopic, BLADDER SCAN AMB NON-IMAGING  Type 2 diabetes mellitus with other specified complication, unspecified whether long term insulin use (HCC) - Plan: Urinalysis, Routine w reflex microscopic, BLADDER SCAN AMB NON-IMAGING  NEPHROLITHIASIS, HX OF - Plan: Urinalysis, Routine w reflex microscopic, BLADDER SCAN AMB NON-IMAGING  Microscopic hematuria - Plan: CT ABDOMEN PELVIS W WO CONTRAST  Nephrolithiasis - Plan:  mirabegron ER (MYRBETRIQ) 25 MG TB24 tablet  For urinary frequency, urgency, and urge incontinence: Discussed possible OAB versus other potential etiology. Agreed to increase Myrbetriq to 50 mg daily and advised minimizing caffeine intake.  We discussed evidence of asymptomatic microscopic hematuria (UA from 09/20/2022 & today both positive for 3-10 RBC/hpf; urine culture on 09/20/2022 was negative).  - We discussed possible etiologies including but not limited to: vigorous exercise, sexual activity, stone, trauma, blood thinner use, urinary tract infection, urethral irritation secondary to vaginal atrophy, chronic kidney disease, glomerulonephropathy, malignancy.  - We reviewed the AUA 2020 AMH guideline and risk stratification for this patient. Based on individual risk factors, pt was advised that the recommended workup includes CT urogram and cystoscopy. Pt decided to pursue this work-up and follow-up afterward to discuss the results and formulate a treatment plan based on the findings. All questions were answered.   PLAN Advised the following: Myrbetriq 50 mg daily. Avoid caffeine. CT.  Return for 1st available cystoscopy with Dr. Ronne Binning.  Orders Placed This Encounter  Procedures   CT ABDOMEN PELVIS W WO CONTRAST    Standing Status:   Future    Standing Expiration Date:   10/30/2023    Order Specific Question:   If indicated for the ordered procedure, I authorize the administration of contrast media per Radiology protocol    Answer:   Yes    Order Specific Question:   Does the patient have a contrast media/X-ray dye allergy?    Answer:   No    Order Specific Question:   Preferred imaging location?    Answer:   [facility]    Order Specific Question:   If indicated for the ordered procedure, I authorize the administration of oral contrast media per Radiology protocol    Answer:   Yes   Urinalysis, Routine w reflex microscopic   BLADDER SCAN AMB NON-IMAGING    It has been  explained that the patient is to follow regularly with their PCP in addition to all other providers involved in their care and to follow instructions provided by these respective offices. Patient advised to contact urology clinic if any urologic-pertaining questions, concerns, new symptoms or problems arise in the interim period.  There are no Patient Instructions on file for this visit.  Electronically signed by:  Donnita Falls, MSN, FNP-C,  CUNP 10/30/2022 11:38 AM

## 2022-10-30 ENCOUNTER — Ambulatory Visit (INDEPENDENT_AMBULATORY_CARE_PROVIDER_SITE_OTHER): Payer: Medicare HMO | Admitting: Urology

## 2022-10-30 ENCOUNTER — Encounter: Payer: Self-pay | Admitting: Urology

## 2022-10-30 VITALS — BP 145/69 | HR 58 | Temp 98.6°F

## 2022-10-30 DIAGNOSIS — Z87442 Personal history of urinary calculi: Secondary | ICD-10-CM | POA: Diagnosis not present

## 2022-10-30 DIAGNOSIS — R3915 Urgency of urination: Secondary | ICD-10-CM | POA: Diagnosis not present

## 2022-10-30 DIAGNOSIS — R3129 Other microscopic hematuria: Secondary | ICD-10-CM | POA: Diagnosis not present

## 2022-10-30 DIAGNOSIS — R350 Frequency of micturition: Secondary | ICD-10-CM

## 2022-10-30 DIAGNOSIS — N200 Calculus of kidney: Secondary | ICD-10-CM | POA: Diagnosis not present

## 2022-10-30 DIAGNOSIS — E1169 Type 2 diabetes mellitus with other specified complication: Secondary | ICD-10-CM | POA: Diagnosis not present

## 2022-10-30 LAB — URINALYSIS, ROUTINE W REFLEX MICROSCOPIC
Bilirubin, UA: NEGATIVE
Glucose, UA: NEGATIVE
Ketones, UA: NEGATIVE
Nitrite, UA: NEGATIVE
Specific Gravity, UA: 1.025 (ref 1.005–1.030)
Urobilinogen, Ur: 0.2 mg/dL (ref 0.2–1.0)
pH, UA: 5.5 (ref 5.0–7.5)

## 2022-10-30 LAB — BLADDER SCAN AMB NON-IMAGING: Scan Result: 0

## 2022-10-30 LAB — MICROSCOPIC EXAMINATION: WBC, UA: >30 /hpf — AB (ref 0–5)

## 2022-10-30 MED ORDER — MIRABEGRON ER 25 MG PO TB24
50.0000 mg | ORAL_TABLET | Freq: Every day | ORAL | 11 refills | Status: DC
Start: 2022-10-30 — End: 2022-12-06

## 2022-11-02 DIAGNOSIS — H25812 Combined forms of age-related cataract, left eye: Secondary | ICD-10-CM | POA: Diagnosis not present

## 2022-11-02 LAB — HM DIABETES EYE EXAM

## 2022-11-13 ENCOUNTER — Telehealth: Payer: Self-pay | Admitting: Family Medicine

## 2022-11-16 ENCOUNTER — Encounter: Payer: Self-pay | Admitting: Family

## 2022-11-16 ENCOUNTER — Ambulatory Visit (INDEPENDENT_AMBULATORY_CARE_PROVIDER_SITE_OTHER): Payer: Medicare HMO | Admitting: Family

## 2022-11-16 VITALS — BP 134/53 | HR 50 | Temp 97.5°F | Ht 64.0 in | Wt 163.4 lb

## 2022-11-16 DIAGNOSIS — I10 Essential (primary) hypertension: Secondary | ICD-10-CM | POA: Diagnosis not present

## 2022-11-16 DIAGNOSIS — J4520 Mild intermittent asthma, uncomplicated: Secondary | ICD-10-CM | POA: Diagnosis not present

## 2022-11-16 DIAGNOSIS — Z86718 Personal history of other venous thrombosis and embolism: Secondary | ICD-10-CM | POA: Diagnosis not present

## 2022-11-16 DIAGNOSIS — E785 Hyperlipidemia, unspecified: Secondary | ICD-10-CM

## 2022-11-16 DIAGNOSIS — I700 Atherosclerosis of aorta: Secondary | ICD-10-CM | POA: Diagnosis not present

## 2022-11-16 DIAGNOSIS — E1169 Type 2 diabetes mellitus with other specified complication: Secondary | ICD-10-CM | POA: Diagnosis not present

## 2022-11-16 DIAGNOSIS — I5022 Chronic systolic (congestive) heart failure: Secondary | ICD-10-CM | POA: Diagnosis not present

## 2022-11-16 DIAGNOSIS — I6930 Unspecified sequelae of cerebral infarction: Secondary | ICD-10-CM

## 2022-11-16 DIAGNOSIS — Z01818 Encounter for other preprocedural examination: Secondary | ICD-10-CM

## 2022-11-16 LAB — BAYER DCA HB A1C WAIVED: HB A1C (BAYER DCA - WAIVED): 7.5 % — ABNORMAL HIGH (ref 4.8–5.6)

## 2022-11-16 MED ORDER — APIXABAN 5 MG PO TABS
5.0000 mg | ORAL_TABLET | Freq: Two times a day (BID) | ORAL | 3 refills | Status: DC
Start: 2022-11-16 — End: 2023-08-24

## 2022-11-16 MED ORDER — BISOPROLOL FUMARATE 10 MG PO TABS
10.0000 mg | ORAL_TABLET | Freq: Every day | ORAL | 1 refills | Status: DC
Start: 2022-11-16 — End: 2023-10-19

## 2022-11-16 MED ORDER — ALBUTEROL SULFATE HFA 108 (90 BASE) MCG/ACT IN AERS: 2.0000 | INHALATION_SPRAY | Freq: Four times a day (QID) | RESPIRATORY_TRACT | 0 refills | Status: DC | PRN

## 2022-11-16 NOTE — Patient Instructions (Signed)
Cataract  A cataract is a buildup of protein that causes the lens of the eye to become cloudy. The lens is normally clear. It is the part of the eye that is behind the iris and pupil. The lens focuses light on the retina, which lets you see clearly. When a lens becomes cloudy, your vision may become blurry. The clouding can range from a tiny dot of blurriness to complete cloudiness. As some cataracts develop, they can make it harder for you to see things that are far away. (You become more nearsighted.) Other cataracts increase glare or cause more problems with reading. Cataracts usually get worse over time, and sometimes the pupil can look white. As cataracts get worse, they cloud more of the lens, making it difficult to see. Cataracts can affect one eye or both eyes. What are the causes? This condition is usually caused by age-related eye changes. The lens of the eye is mostly made up of water and protein. Normally, this protein is arranged in a way that keeps the lens clear. Cataracts develop when protein begins to clump together over time. This buildup of protein clouds the lens and lets less light pass through to the retina, which causes blurry vision. What increases the risk? You are more likely to develop this condition if you: Are 40 years of age or older. Have diabetes. Take certain medicines for long periods of time, such as steroids or hormone replacement therapy. Have had a previous eye surgery. Have a family history of cataracts. Other conditions associated with developing cataracts include: Having high blood pressure. Having had a previous eye injury. Smoking. Being obese. Drinking alcohol heavily. Having been exposed to large amounts of radiation, lead, or other toxic substances. Frequently being exposed to sun or very strong light without eye protection. What are the signs or symptoms? The main symptom of a cataract is blurry vision. Your vision may change or get worse over  time. Other symptoms include: Increased glare. Seeing a bright ring or halo around light. Poor night vision. Double vision or seeing shadows in one eye or both eyes. Having trouble seeing or reading up close. Seeing colors that appear faded. How is this diagnosed? This condition is diagnosed with a medical history and eye exam. You should see an eye specialist (optometrist or ophthalmologist). Your health care provider may enlarge (dilate) your pupils with eye drops to see the back of your eye more clearly and look for signs of cataracts or other eye problems. You may also have tests, including: A visual acuity test. This uses a chart to determine the smallest letters that you can see from a specific distance. A slit-lamp exam. This uses a microscope to examine small sections of your eye for abnormalities. Tonometry. This test measures the pressure of the fluid inside your eye. Glare testing. This test shines a light in your eye while you view letters to see whether the bright light affects your vision. How is this treated? Treatment depends on the stage of your cataract. You may benefit from: A new prescription for eyeglasses or contact lenses. You can also use stronger light, especially for reading. These are mainly for early-stage cataracts. Having cataract surgery if the condition is severely affecting your vision. This is needed for late-stage cataracts. Follow these instructions at home: Lifestyle Use stronger or brighter lighting. Consider using a magnifying glass for reading or other activities. Become familiar with your surroundings. Having poor vision can put you at greater risk for tripping, falling, or  bumping into things. Wear sunglasses and a hat if you are sensitive to bright light or are having problems with glare. Do not use any products that contain nicotine or tobacco. These products include cigarettes, chewing tobacco, and vaping devices, such as e-cigarettes. If you  need help quitting, ask your health care provider. General instructions If you are prescribed new eyeglasses or contacts, wear them as told by your health care provider. Take over-the-counter and prescription medicines only as told by your health care provider. Do not change your medicines unless told to by your health care provider. Do not drive or use machinery if your vision is blurry, especially at night. Keep your blood sugar under control if you have diabetes. Keep all follow-up visits. This is important. Contact a health care provider if: Your symptoms get worse. Your vision affects your ability to perform daily activities, especially driving. You have new symptoms. Get help right away if: You have sudden vision loss. You have redness, swelling, or increasing pain in your eye. You develop a sudden, severe headache and increased sensitivity to light. Summary A cataract is a buildup of protein that causes the lens of your eye to become cloudy. Cataracts are very common, especially as people age. Mild cataracts cause mild visual symptoms, while more severe cataracts can cause a significant decrease in quality of life. Mild cataracts can often be treated with a prescription for new glasses or contact lenses, while surgery is often recommended for more severe cataracts. Contact a health care provider if your symptoms get worse or your vision affects your ability to do daily activities. Get help right away if you have sudden vision loss, redness, swelling, or increasing pain in the eye, or you develop a sudden, severe headache or increased sensitivity to light. This information is not intended to replace advice given to you by your health care provider. Make sure you discuss any questions you have with your health care provider. Document Revised: 10/27/2020 Document Reviewed: 10/27/2020 Elsevier Patient Education  2024 [employer].

## 2022-11-16 NOTE — Progress Notes (Signed)
Subjective:    Patient ID: [name], female    DOB: 01-06-1946, 77 y.o.   MRN: [id number]  Chief Complaint  Patient presents with   Pre-op Exam   PT presents to the office today for pre op exam. She is scheduled to have cataract surgery next week.   Has hx of CVA, HTN, DM, hx of DVT, and asthma. Takes Eliquis BID.   She is followed by Neurologists.   Followed by Cardiologists annually for CHF, HTN, She has aortic atherosclerosis and is prescribed Crestor daily. However, she does not take regularly.   Followed by Urologists every 3 months for kidney stones.  Hypertension This is a chronic problem. The current episode started more than 1 year ago. The problem has been waxing and waning since onset. The problem is uncontrolled. Associated symptoms include malaise/fatigue and peripheral edema. Pertinent negatives include no blurred vision or shortness of breath. Risk factors for coronary artery disease include dyslipidemia, diabetes mellitus, obesity and sedentary lifestyle. The current treatment provides moderate improvement.  Asthma There is no cough, shortness of breath or wheezing. The current episode started more than 1 year ago. The problem occurs intermittently. Associated symptoms include malaise/fatigue. Her past medical history is significant for asthma.  Congestive Heart Failure Presents for follow-up visit. Pertinent negatives include no shortness of breath. The symptoms have been stable.  Hyperlipidemia This is a chronic problem. The current episode started more than 1 year ago. Exacerbating diseases include obesity. Pertinent negatives include no shortness of breath. Current antihyperlipidemic treatment includes statins. Risk factors for coronary artery disease include dyslipidemia, diabetes mellitus, hypertension, post-menopausal and a sedentary lifestyle.  Diabetes She presents for her follow-up diabetic visit. She has type 2 diabetes mellitus. Pertinent negatives for  diabetes include no blurred vision and no foot paresthesias. Risk factors for coronary artery disease include diabetes mellitus, dyslipidemia, hypertension, sedentary lifestyle and post-menopausal. (Does not check glucose at home) Eye exam is current.      Review of Systems  Constitutional:  Positive for malaise/fatigue.  Eyes:  Negative for blurred vision.  Respiratory:  Negative for cough, shortness of breath and wheezing.        Objective:   Physical Exam Vitals reviewed.  Constitutional:      General: She is not in acute distress.    Appearance: She is well-developed.  HENT:     Head: Normocephalic and atraumatic.     Right Ear: Tympanic membrane normal.     Left Ear: Tympanic membrane normal.  Eyes:     Pupils: Pupils are equal, round, and reactive to light.  Neck:     Thyroid: No thyromegaly.  Cardiovascular:     Rate and Rhythm: Normal rate and regular rhythm.     Heart sounds: Normal heart sounds. No murmur heard. Pulmonary:     Effort: Pulmonary effort is normal. No respiratory distress.     Breath sounds: Normal breath sounds. No wheezing.  Abdominal:     General: Bowel sounds are normal. There is no distension.     Palpations: Abdomen is soft.     Tenderness: There is no abdominal tenderness.  Musculoskeletal:        General: No tenderness. Normal range of motion.     Cervical back: Normal range of motion and neck supple.     Right lower leg: Edema (trace) present.     Left lower leg: Edema (2+) present.  Skin:    General: Skin is warm and dry.  Neurological:  Mental Status: She is alert and oriented to person, place, and time.     Cranial Nerves: No cranial nerve deficit.     Deep Tendon Reflexes: Reflexes are normal and symmetric.  Psychiatric:        Behavior: Behavior normal.        Thought Content: Thought content normal.        Judgment: Judgment normal.       BP (!) 158/63   Pulse (!) 40   Temp (!) 97.5 F (36.4 C) (Temporal)   Ht 5\' 4"   (1.626 m)   Wt 163 lb 6.4 oz (74.1 kg)   LMP 01/22/1981 (LMP Unknown)   SpO2 97%   BMI 28.05 kg/m      Assessment & Plan:  [name] comes in today with chief complaint of Pre-op Exam   Diagnosis and orders addressed:  1. Essential hypertension - bisoprolol (ZEBETA) 10 MG tablet; Take 1 tablet (10 mg total) by mouth daily.  Dispense: 90 tablet; Refill: 1  2. Late effect of cerebrovascular accident (CVA) - apixaban (ELIQUIS) 5 MG TABS tablet; Take 1 tablet (5 mg total) by mouth 2 (two) times daily. START AFTER STARTER PACK  Dispense: 60 tablet; Refill: 3  3. Pre-op exam  4. Aortic atherosclerosis (HCC) - Lipid panel  5. Mild intermittent asthma, unspecified whether complicated  6. Chronic systolic heart failure (HCC)  7. Hyperlipidemia associated with type 2 diabetes mellitus (HCC) - Lipid panel  8. Type 2 diabetes mellitus with other specified complication, without long-term current use of insulin (HCC) - Bayer DCA Hb A1c Waived - Microalbumin / creatinine urine ratio  9. History of DVT (deep vein thrombosis) - albuterol (VENTOLIN HFA) 108 (90 Base) MCG/ACT inhaler; Inhale 2 puffs into the lungs every 6 (six) hours as needed for wheezing or shortness of breath.  Dispense: 18 g; Refill: 0   Labs pending Health Maintenance reviewed Diet and exercise encouraged  Follow up plan: 3 months   Jannifer Rodney, FNP

## 2022-11-24 DIAGNOSIS — H25812 Combined forms of age-related cataract, left eye: Secondary | ICD-10-CM | POA: Diagnosis not present

## 2022-12-01 DIAGNOSIS — H25811 Combined forms of age-related cataract, right eye: Secondary | ICD-10-CM | POA: Diagnosis not present

## 2022-12-06 ENCOUNTER — Ambulatory Visit: Payer: Medicare HMO | Admitting: Neurology

## 2022-12-06 ENCOUNTER — Encounter: Payer: Self-pay | Admitting: Neurology

## 2022-12-06 VITALS — BP 159/67 | HR 56 | Ht 64.0 in | Wt 164.2 lb

## 2022-12-06 DIAGNOSIS — R4020 Unspecified coma: Secondary | ICD-10-CM | POA: Diagnosis not present

## 2022-12-06 NOTE — Patient Instructions (Signed)
Good to meet you.  Schedule 1-hour EEG. If normal, we will do a 2-day home EEG  2. Please contact Dr. Antoine Poche and let him know about the event as well  3. It is prudent to recommend that all persons should be free of syncopal (loss of consciousness) episodes for at least six months to be granted the driving privilege." (THE [place] PHYSICIAN'S GUIDE TO DRIVER MEDICAL EVALUATION, Second Edition, Medical Review Branch, Associate Professor, Division of [employer] of Transportation, July 2004)  4. Follow-up after tests, call for any changes    RECOMMENDATIONS FOR ALL PATIENTS WITH MEMORY PROBLEMS: 1. Continue to exercise (Recommend 30 minutes of walking everyday, or 3 hours every week) 2. Increase social interactions - continue going to [place] and enjoy social gatherings with friends and family 3. Eat healthy, avoid fried foods and eat more fruits and vegetables 4. Maintain adequate blood pressure, blood sugar, and blood cholesterol level. Reducing the risk of stroke and cardiovascular disease also helps promoting better memory. 5. Avoid stressful situations. Live a simple life and avoid aggravations. Organize your time and prepare for the next day in anticipation. 6. Sleep well, avoid any interruptions of sleep and avoid any distractions in the bedroom that may interfere with adequate sleep quality 7. Avoid sugar, avoid sweets as there is a strong link between excessive sugar intake, diabetes, and cognitive impairment The Mediterranean diet has been shown to help patients reduce the risk of progressive memory disorders and reduces cardiovascular risk. This includes eating fish, eat fruits and green leafy vegetables, nuts like almonds and hazelnuts, walnuts, and also use olive oil. Avoid fast foods and fried foods as much as possible. Avoid sweets and sugar as sugar use has been linked to worsening of memory function.

## 2022-12-06 NOTE — Progress Notes (Signed)
NEUROLOGY CONSULTATION NOTE  [name] MRN: [id number] DOB: 30-Sep-1945  Referring provider: Marita [place], PA-C Primary care provider: Jannifer Rodney, FNP  Reason for consult:  dysequilibrium   Thank you for your kind referral of [name] for consultation of the above symptoms. Although her history is well known to you, please allow me to reiterate it for the purpose of our medical record. The patient was accompanied to the clinic by her daughter Judeth Cornfield and granddaughter Pearson Grippe who also provides collateral information. Records and images were personally reviewed where available.   HISTORY OF PRESENT ILLNESS: This is a pleasant 77 year old left-handed woman with a history of hypertension, CHF, NICM, DM2, OSA, nephrolithiasis, referred to our office for evaluation of dysequilibrium. Referral and notes from ER reviewed where it notes that she complained of dizziness, unsteadiness since the day prior, there is no mention of loss of consciousness however patient reports that she lost consciousness on 10/22/22. She recalls driving home from church feeling tired, wanting to lay down,then her next recollection is her daughter knocking on the car window. She does not recall getting home and parking on the driveway. Her daughter reports that the dog came out when her car arrived home but she noticed the dog was acting odd going back and forth between the car doors. She saw her mother's head was slumped down and first thought she was reading something, she knocked on the window opened the door, and as her mother came to, she was really confused, not knowing how she got there. It took a while to speak words. No tongue bite or incontinence. She recalls feeling very disoriented and things around her looked caramel colored. She felt very tired and heavy. No dents on the car seen. They went to the ER the next day where bloodwork showed a creatinine of 1.21, BUN 26. I personally reviewed brain MRI  without contrast which did not show any acute changes, there was moderate chronic microvascular disease and small lacunar infarcts in the right basal ganglia. EKG noted: "Normal sinus rhythm Right superior axis deviation Incomplete right bundle branch block Anterior infarct , age undetermined T wave abnormality, consider lateral ischemia."  No prior episodes of loss of consciousness, and none since 10/22/22. Family denies any staring episodes. For the past few months, she has had times where she wonders how long she had been sitting in a spot. Sometimes she notices a metallic taste in her mouth. She has some epigastric sensations due to indigestion. She denies any focal numbness/tingling/weakness, no myoclonic jerks. Her daughter notes that over the past 8 months, she has been very, very forgetful, forgetting birthdays or that Judeth Cornfield had been engaged. They are not sure if she forgets her medications or is just not good with taking them, but she had not been taking her BP medication for several days. She was boiling eggs 6 weeks ago and forgot about it. She denies getting lost driving. Judeth Cornfield says she is not supposed to drive at night, she says "who said that?" and they remind her that she had told them that. No missed bill payments. She has become more easily frustrated since her stroke 9 years ago, it has been a slowly progressive issue but more pronounced in the past 8-9 months. She gets more agitated, which is not like her when she used to be more of an introvert and avoided confrontations. No hallucinations or paranoia. Her mother had dementia. Her mother used to have episodes of loss of  consciousness. She had a normal birth and early development, no history of febrile convulsions, CNS infections, significant head injuries, neurosurgical procedures.   She has a lot of cramps in her legs and feet. She had cataract surgery on both eyes recently and has had headaches since the surgery, but none on a  regular basis. She gets dizzy when laying down a certain way (too flat) and things start swirling. No neck/back pain. She has a lot of urinary incontinence and wears diapers at night. She has been having palpitations and very random chest pain and shortness of breath. She usually gets 5-6 hours of sleep and denies sleep deprivation prior to the episode. She had some wine the night prior since it was her birthday.     PAST MEDICAL HISTORY: Past Medical History:  Diagnosis Date   Asthma    Chronic systolic heart failure (HCC)    Echo (08/26/13):  Mild LVH. EF 25% to 30%. Diffuse HK. Aortic valve: There was trivial regurgitation. Mitral valve: There was mild regurgitation. Left atrium: The atrium was mildly dilated.   GERD (gastroesophageal reflux disease)    Headache(784.0)    History of kidney stones    History of nephrolithiasis    Hypertension    NICM (nonischemic cardiomyopathy) (HCC)    a. LHC (08/2013):  no CAD   Sleep apnea    does not use machine   Stroke (HCC)    Type II or unspecified type diabetes mellitus without mention of complication, uncontrolled    borderline    PAST SURGICAL HISTORY: Past Surgical History:  Procedure Laterality Date   ABDOMINAL HYSTERECTOMY     CATARACT EXTRACTION     COLONOSCOPY WITH PROPOFOL N/A 07/01/2019   Procedure: COLONOSCOPY WITH PROPOFOL;  Surgeon: West Bali, MD;  [facility]: [facility];  Service: Endoscopy;  Laterality: N/A;  2:15pm - pt will not move up   CYSTOSCOPY WITH RETROGRADE PYELOGRAM, URETEROSCOPY AND STENT PLACEMENT Left 08/11/2021   Procedure: CYSTOSCOPY WITH RETROGRADE PYELOGRAM, URETEROSCOPY AND STENT PLACEMENT;  Surgeon: Malen Gauze, MD;  Location: [facility];  Service: Urology;  Laterality: Left;   CYSTOSCOPY WITH RETROGRADE PYELOGRAM, URETEROSCOPY AND STENT PLACEMENT Left 09/08/2021   Procedure: CYSTOSCOPY WITH RETROGRADE PYELOGRAM, URETEROSCOPY AND STENT EXCHANGE;  Surgeon: Malen Gauze, MD;  Location: [facility];  Service: Urology;  Laterality: Left;   CYSTOSCOPY WITH RETROGRADE PYELOGRAM, URETEROSCOPY AND STENT PLACEMENT Right 10/06/2021   Procedure: CYSTOSCOPY WITH RETROGRADE PYELOGRAM, URETEROSCOPY AND STENT PLACEMENT;  Surgeon: Malen Gauze, MD;  Location: [facility];  Service: Urology;  Laterality: Right;   CYSTOSCOPY WITH RETROGRADE PYELOGRAM, URETEROSCOPY AND STENT PLACEMENT Right 10/27/2021   Procedure: CYSTOSCOPY WITH RETROGRADE PYELOGRAM, URETEROSCOPY AND STENT EXCHANGE;  Surgeon: Malen Gauze, MD;  Location: [facility];  Service: Urology;  Laterality: Right;   EXTRACORPOREAL SHOCK WAVE LITHOTRIPSY Left 05/31/2021   Procedure: EXTRACORPOREAL SHOCK WAVE LITHOTRIPSY (ESWL);  Surgeon: Malen Gauze, MD;  Location: [facility];  Service: Urology;  Laterality: Left;   EXTRACORPOREAL SHOCK WAVE LITHOTRIPSY Left 06/28/2021   Procedure: EXTRACORPOREAL SHOCK WAVE LITHOTRIPSY (ESWL);  Surgeon: Malen Gauze, MD;  Location: [facility];  Service: Urology;  Laterality: Left;   HOLMIUM LASER APPLICATION Left 08/11/2021   Procedure: HOLMIUM LASER APPLICATION;  Surgeon: Malen Gauze, MD;  Location: [facility];  Service: Urology;  Laterality: Left;   HOLMIUM LASER APPLICATION Left 09/08/2021   Procedure: HOLMIUM LASER APPLICATION;  Surgeon: Malen Gauze, MD;  Location: [facility];  Service: Urology;  Laterality: Left;   HOLMIUM LASER APPLICATION Right 10/06/2021   Procedure: HOLMIUM LASER APPLICATION;  Surgeon: Malen Gauze, MD;  Location: [facility];  Service: Urology;  Laterality: Right;   HOLMIUM LASER APPLICATION Right 10/27/2021   Procedure: HOLMIUM LASER APPLICATION;  Surgeon: Malen Gauze, MD;  Location: [facility];  Service: Urology;  Laterality: Right;   LEFT AND RIGHT HEART CATHETERIZATION WITH CORONARY ANGIOGRAM N/A 09/03/2013   Procedure: LEFT AND RIGHT HEART CATHETERIZATION WITH CORONARY ANGIOGRAM;  Surgeon: Kathleene Hazel, MD;  [facility] C[facility];  Service:  Cardiovascular;  Laterality: N/A;   OOPHORECTOMY     removed 1 ovary and 1 ovary remains.   POLYPECTOMY  07/01/2019   Procedure: POLYPECTOMY;  Surgeon: West Bali, MD;  [facility];  Service: Endoscopy;;   STONE EXTRACTION WITH BASKET Left 09/08/2021   Procedure: STONE EXTRACTION WITH BASKET;  Surgeon: Malen Gauze, MD;  Location: [facility];  Service: Urology;  Laterality: Left;   STONE EXTRACTION WITH BASKET Right 10/27/2021   Procedure: STONE EXTRACTION WITH BASKET;  Surgeon: Malen Gauze, MD;  Location: [facility];  Service: Urology;  Laterality: Right;   ureteral extraction of kidney stone      MEDICATIONS: Current Outpatient Medications on File Prior to Visit  Medication Sig Dispense Refill   acetaminophen (TYLENOL) 500 MG tablet Take 1,000 mg by mouth every 6 (six) hours as needed for moderate pain or headache.     albuterol (VENTOLIN HFA) 108 (90 Base) MCG/ACT inhaler Inhale 2 puffs into the lungs every 6 (six) hours as needed for wheezing or shortness of breath. 18 g 0   amLODipine (NORVASC) 10 MG tablet Take 1 tablet (10 mg total) by mouth daily. 90 tablet 2   apixaban (ELIQUIS) 5 MG TABS tablet Take 1 tablet (5 mg total) by mouth 2 (two) times daily. START AFTER STARTER PACK 60 tablet 3   bisoprolol (ZEBETA) 10 MG tablet Take 1 tablet (10 mg total) by mouth daily. 90 tablet 1   glucose blood (ONETOUCH VERIO) test strip Use as instructed 100 each 12   ONETOUCH DELICA LANCETS FINE MISC Use to check BG once daily 100 each 2   oxyCODONE-acetaminophen (PERCOCET) 5-325 MG tablet Take 1 tablet by mouth every 4 (four) hours as needed for severe pain. 30 tablet 0   sacubitril-valsartan (ENTRESTO) 97-103 MG Take 1 tablet by mouth twice daily 90 tablet 3   rosuvastatin (CRESTOR) 10 MG tablet Take 1 tablet (10 mg total) by mouth daily. (Patient not taking: Reported on 12/06/2022) 90 tablet 3   [DISCONTINUED] escitalopram (LEXAPRO) 20 MG tablet Take 1 tablet (20 mg total)  by mouth daily. (Patient not taking: Reported on 05/10/2020) 30 tablet 1   No current facility-administered medications on file prior to visit.    ALLERGIES: Allergies  Allergen Reactions   Levaquin [Levofloxacin In D5w] Diarrhea   Penicillins Rash    FAMILY HISTORY: Family History  Problem Relation Age of Onset   Hypertension Mother    Arthritis Mother    CAD Mother 64   Hip fracture Mother 23   COPD Father    Crohn's disease Sister    Cancer Sister        lung   Early death Brother 42       house fire   Diabetes Maternal Grandmother    Hypertension Sister    Hypertension Brother    Colon cancer Neg Hx     SOCIAL HISTORY: Social History  Socioeconomic History   Marital status: Divorced    Spouse name: Not on file   Number of children: 2   Years of education: Not on file   Highest education level: Not on file  Occupational History   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 06/19/21-Currently lives with daughter and is actively looking for a rental home.    1 son and 1 daughter. Son died in 2016/01/07.   2 grandchildren   Left handedNo caffiene   Social Determinants of Health   Financial Resource Strain: Low Risk  (09/19/2022)   Overall Financial Resource Strain (CARDIA)    Difficulty of Paying Living Expenses: Not hard at all  Food Insecurity: No Food Insecurity (09/19/2022)   Hunger Vital Sign    Worried About Running Out of Food in the Last Year: Never true    Ran Out of Food in the Last Year: Never true  Transportation Needs: No Transportation Needs (09/19/2022)    PRAPARE - Administrator, Civil Service (Medical): No    Lack of Transportation (Non-Medical): No  Physical Activity: Insufficiently Active (09/19/2022)   Exercise Vital Sign    Days of Exercise per Week: 3 days    Minutes of Exercise per Session: 30 min  Stress: No Stress Concern Present (09/19/2022)   [employer] of Occupational Health - Occupational Stress Questionnaire    Feeling of Stress : Not at all  Social Connections: Moderately Integrated (09/19/2022)   Social Connection and Isolation Panel [NHANES]    Frequency of Communication with Friends and Family: More than three times a week    Frequency of Social Gatherings with Friends and Family: More than three times a week    Attends Religious Services: More than 4 times per year    Active Member of [employer] or Organizations: Yes    Attends Engineer, structural: More than 4 times per year    Marital Status: Divorced  Intimate Partner Violence: Not At Risk (09/19/2022)   Humiliation, Afraid, Rape, and Kick questionnaire    Fear of Current or Ex-Partner: No    Emotionally Abused: No    Physically Abused: No    Sexually Abused: No     PHYSICAL EXAM: Vitals:   12/06/22 1021  BP: (!) 159/67  Pulse: (!) 56  SpO2: 98%   General: No acute distress Head:  Normocephalic/atraumatic Skin/Extremities: No rash, no edema Neurological Exam: Mental status: alert and oriented to person, place, and time, no dysarthria or aphasia, Fund of knowledge is appropriate.  Recent and remote memory are intact.  Attention and concentration are normal.    Able to name objects and repeat phrases. MMSE 30/30    12/06/2022    3:00 PM  MMSE - Mini Mental State Exam  Orientation to time 5  Orientation to Place 5  Registration 3  Attention/ Calculation 5  Recall 3  Language- name 2 objects 2  Language- repeat 1  Language- follow 3 step command 3  Language- read & follow direction 1  Write a sentence 1  Copy design 1  Total  score 30   Cranial nerves: CN I: not tested CN II: pupils equal, round, visual fields intact CN III, IV, VI:  full range of motion, no nystagmus, no ptosis CN V: decreased cold on right V1,V2, intact to pin CN VII: upper and lower face symmetric CN VIII: hearing intact to conversation CN XI: sternocleidomastoid and trapezius muscles intact CN XII: tongue midline Bulk & Tone: normal, no fasciculations. Motor: 5/5 throughout with no pronator drift. Sensation: intact to light touch, cold, pin on both UE, decreased cold and pin on right LE, decreased vibration sense to ankles bilaterally. Romberg test negative Deep Tendon Reflexes: +2 throughout Cerebellar: no incoordination on finger to nose testing Gait: narrow-based and steady, no ataxia Tremor: none   IMPRESSION: This is a pleasant 77 year old left-handed woman with a history of hypertension, CHF, NICM, DM2, OSA, nephrolithiasis, referred to our office for dysequilibrium, however on further questioning, she was found slumped down in her car on 10/22/22, with she woke up when daughter knocked on window but was disoriented, unable to recall how she got home. She denies any dizziness. We also discussed cognitive concerns, MMSE today 30/30, etiology unclear. Her neurological exam shows some sensory changes on the right and neuropathy. Etiology syncope unclear, from a neurological standpoint, we will do a 1-hour EEG. If normal, 48-hour EEG will be ordered. She was advised to call her Cardiologist about the syncopal episode as well. Family advised to monitor medications. [place] driving laws discussed, no driving after an episode of loss of consciousness until 6 months event-free. Follow-up after tests, call for any changes.    Thank you for allowing me to participate in the care of this patient. Please do not hesitate to call for any questions or concerns.   Patrcia Dolly, M.D.  CC: Jannifer Rodney, FNP, Marita Kansas, [place]

## 2022-12-07 ENCOUNTER — Ambulatory Visit (INDEPENDENT_AMBULATORY_CARE_PROVIDER_SITE_OTHER): Payer: Medicare HMO | Admitting: Neurology

## 2022-12-07 DIAGNOSIS — R4020 Unspecified coma: Secondary | ICD-10-CM

## 2022-12-07 NOTE — Progress Notes (Signed)
EEG complete - results pending 

## 2022-12-07 NOTE — Procedures (Signed)
ELECTROENCEPHALOGRAM REPORT  Date of Study: 12/07/2022  Patient's Name: [name] MRN: [id number] Date of Birth: [birth year]  Referring Provider: Dr. Patrcia Dolly  Clinical History: This is a 77 year old woman with an episode of loss of consciousness and memory loss. EEG for classification.  Medications: Eliquis, Norvasc, Zebeta, Entresto, Crestor  Technical Summary: A multichannel digital 1-hour EEG recording measured by the international 10-20 system with electrodes applied with paste and impedances below 5000 ohms performed in our laboratory with EKG monitoring in an awake and asleep patient.  Hyperventilation was not performed. Photic stimulation was performed.  The digital EEG was referentially recorded, reformatted, and digitally filtered in a variety of bipolar and referential montages for optimal display.    Description: The patient is awake and asleep during the recording.  During maximal wakefulness, there is a symmetric, medium voltage 8.5 Hz posterior dominant rhythm that attenuates with eye opening.  The record is symmetric.  During drowsiness and sleep, there is an increase in theta slowing of the background.  Vertex waves and symmetric sleep spindles were seen. Photic stimulation did not elicit any abnormalities.  There were no epileptiform discharges or electrographic seizures seen.    EKG lead showed sinus bradycardia 42-48 bpm.  Impression: This 1-hour awake and asleep EEG is normal.    Clinical Correlation: A normal EEG does not exclude a clinical diagnosis of epilepsy.  If further clinical questions remain, prolonged EEG may be helpful.  Clinical correlation is advised.   Patrcia Dolly, M.D.

## 2022-12-08 ENCOUNTER — Ambulatory Visit (HOSPITAL_COMMUNITY): Payer: Medicare HMO

## 2022-12-08 DIAGNOSIS — H524 Presbyopia: Secondary | ICD-10-CM | POA: Diagnosis not present

## 2022-12-08 DIAGNOSIS — H04123 Dry eye syndrome of bilateral lacrimal glands: Secondary | ICD-10-CM | POA: Diagnosis not present

## 2022-12-20 ENCOUNTER — Other Ambulatory Visit: Payer: Medicare HMO | Admitting: Urology

## 2022-12-21 ENCOUNTER — Encounter: Payer: Self-pay | Admitting: Neurology

## 2022-12-22 NOTE — Telephone Encounter (Signed)
Called patient and gave EEG results patient inquiring if she needs to make a follow up appointment

## 2022-12-25 ENCOUNTER — Telehealth: Payer: Self-pay

## 2022-12-25 DIAGNOSIS — R4020 Unspecified coma: Secondary | ICD-10-CM

## 2022-12-25 NOTE — Telephone Encounter (Signed)
See phone note

## 2022-12-25 NOTE — Telephone Encounter (Signed)
-----   Message from Van Clines sent at 12/19/2022 11:30 AM EDT ----- Pls let her/dtr know the EEG is normal, however it is not like a pregnancy test that is yes or no, just a snapshot of her brain waves during the test. We discussed if this is normal, would do a 2-day home EEG, pls order 48-hour EEG if she wants to proceed, thanks

## 2022-12-25 NOTE — Telephone Encounter (Signed)
Pt called an informed  EEG is normal, however it is not like a pregnancy test that is yes or no, just a snapshot of her brain waves during the test. We discussed if this is normal, would do a 2-day home EEG. Pt would like to do the 48 hr eeg

## 2023-01-04 ENCOUNTER — Ambulatory Visit (HOSPITAL_COMMUNITY)
Admission: RE | Admit: 2023-01-04 | Discharge: 2023-01-04 | Disposition: A | Discharge status: Home / Self Care | Payer: Medicare HMO | Source: Ambulatory Visit | Attending: Urology | Admitting: Urology

## 2023-01-04 DIAGNOSIS — R932 Abnormal findings on diagnostic imaging of liver and biliary tract: Secondary | ICD-10-CM | POA: Diagnosis not present

## 2023-01-04 DIAGNOSIS — R3129 Other microscopic hematuria: Secondary | ICD-10-CM | POA: Diagnosis not present

## 2023-01-04 DIAGNOSIS — N200 Calculus of kidney: Secondary | ICD-10-CM | POA: Diagnosis not present

## 2023-01-04 DIAGNOSIS — K449 Diaphragmatic hernia without obstruction or gangrene: Secondary | ICD-10-CM | POA: Diagnosis not present

## 2023-01-04 DIAGNOSIS — R319 Hematuria, unspecified: Secondary | ICD-10-CM | POA: Diagnosis not present

## 2023-01-04 LAB — POCT I-STAT CREATININE: Creatinine, Ser: 1.2 mg/dL — ABNORMAL HIGH (ref 0.44–1.00)

## 2023-01-04 MED ORDER — IOHEXOL 300 MG/ML  SOLN
125.0000 mL | Freq: Once | INTRAMUSCULAR | Status: AC | PRN
  Administered 2023-01-04: 100 mL via INTRAVENOUS

## 2023-01-15 ENCOUNTER — Ambulatory Visit: Payer: Medicare HMO | Admitting: Neurology

## 2023-01-15 DIAGNOSIS — R4020 Unspecified coma: Secondary | ICD-10-CM | POA: Diagnosis not present

## 2023-01-15 NOTE — Progress Notes (Signed)
Ambulatory EEG hooked up and running. Light flashing. Push button tested. Camera and event log explained. Batteries explained. Patient understood.   

## 2023-01-17 NOTE — Progress Notes (Signed)
AMB EEG discontinued. No skin breakdown at unhook.   

## 2023-01-22 ENCOUNTER — Telehealth: Payer: Self-pay

## 2023-01-22 NOTE — Procedures (Addendum)
ELECTROENCEPHALOGRAM REPORT  Dates of Recording: 01/15/2023 3:25PM to 01/17/2023 1:02PM  Patient's Name: [name] MRN: [id number] Date of Birth: 04-Nov-1945  Referring Provider: Dr. Patrcia Dolly  Procedure: 47-hour ambulatory EEG  History:  This is a 77 year old woman with an episode of loss of consciousness and memory loss. EEG for classification.   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  The digital EEG was referentially recorded, reformatted, and digitally filtered in a variety of bipolar and referential montages for optimal display.    DESCRIPTION OF RECORDING: During maximal wakefulness, the background activity consisted of a symmetric 10 Hz posterior dominant rhythm which was reactive to eye opening.  There were no epileptiform discharges or focal slowing seen in wakefulness.  During the recording, the patient progresses through wakefulness, drowsiness, and Stage 2 sleep.  Again, there were no epileptiform discharges seen.  Events: There were push button events on 10/8 at 1540, 1555, and 10/9 at 1105, 1108, and 115 hours. No video recorded. Patient reported 1- headache in back of head and 2- dizziness as she laid down or as she got up. Did not specify date or time. Electrographically, there were no EEG or EKG changes seen.   There were no electrographic seizures seen.  EKG lead showed sinus bradycardia from 42-54 bpm.   IMPRESSION: This 47-hour ambulatory EEG study is normal.    CLINICAL CORRELATION: A normal EEG does not exclude a clinical diagnosis of epilepsy. Episodes of headache and dizziness did not show EEG changes. Note of sinus bradycardia.  If further clinical questions remain, inpatient video EEG monitoring may be  helpful.   Patrcia Dolly, M.D.

## 2023-01-22 NOTE — Telephone Encounter (Signed)
-----   Message from Donnita Falls sent at 01/22/2023  1:12 PM EDT ----- Please let pt know her CT showed no acute findings. Advised to follow up for cystoscopy with Dr. Ronne Binning as planned on 02/19/2023.

## 2023-01-22 NOTE — Telephone Encounter (Signed)
Mychart message sent informing patient of results.

## 2023-02-02 ENCOUNTER — Telehealth: Payer: Self-pay | Admitting: Neurology

## 2023-02-02 NOTE — Telephone Encounter (Signed)
Called patient and she has seen cardiology and they said that they see no reason for the passing out spells. She has not had anymnore spells.Patient said it is getting increasingly hard to get rides she wanted to know if she is clear with EEG and Cardio if she can start driving again

## 2023-02-02 NOTE — Telephone Encounter (Signed)
Pt called in wanting to get her EEG results

## 2023-02-06 NOTE — Telephone Encounter (Signed)
The DMV does not care if the results are normal, they go by the last time she passed out. It would be 6 months from then, especially since we haven't found a clear cause, the DMV wants 6 months of observation to make sure this is not something that would injure herself or others.

## 2023-02-07 ENCOUNTER — Other Ambulatory Visit: Payer: Medicare HMO | Admitting: Urology

## 2023-02-19 ENCOUNTER — Ambulatory Visit: Payer: Medicare HMO | Admitting: Urology

## 2023-02-19 VITALS — BP 144/84 | HR 85

## 2023-02-19 DIAGNOSIS — N2889 Other specified disorders of kidney and ureter: Secondary | ICD-10-CM | POA: Diagnosis not present

## 2023-02-19 DIAGNOSIS — Z87442 Personal history of urinary calculi: Secondary | ICD-10-CM

## 2023-02-19 DIAGNOSIS — N3281 Overactive bladder: Secondary | ICD-10-CM | POA: Diagnosis not present

## 2023-02-19 LAB — URINALYSIS, ROUTINE W REFLEX MICROSCOPIC
Bilirubin, UA: NEGATIVE
Glucose, UA: NEGATIVE
Ketones, UA: NEGATIVE
Nitrite, UA: NEGATIVE
Specific Gravity, UA: 1.02 (ref 1.005–1.030)
Urobilinogen, Ur: 1 mg/dL (ref 0.2–1.0)
pH, UA: 6 (ref 5.0–7.5)

## 2023-02-19 LAB — MICROSCOPIC EXAMINATION

## 2023-02-19 MED ORDER — SOLIFENACIN SUCCINATE 5 MG PO TABS: 5.0000 mg | ORAL_TABLET | Freq: Every day | ORAL | 3 refills | Status: DC

## 2023-02-19 NOTE — Progress Notes (Unsigned)
02/19/2023 10:39 AM   [name] 1946/04/05 [id number]  Referring provider: Junie Spencer, FNP [address] [place],  [place] [address]   Followup OAB, hematuria, nephrolithiasis  HPI: [name] is a 77yo here for followup for microhematuria, OAB and nephrolithiasis. No stone events since last visit. She has bothersome urinary incontinence related to urge. She uses 3-4 pads and pullups per day. She previously took [place] which failed to improve her incontinence. She underwent CT hematuria protocol for microhematuria which showed a possible thickened left proximal ureter.    PMH: Past Medical History:  Diagnosis Date   Asthma    Chronic systolic heart failure (HCC)    Echo (08/26/13):  Mild LVH. EF 25% to 30%. Diffuse HK. Aortic valve: There was trivial regurgitation. Mitral valve: There was mild regurgitation. Left atrium: The atrium was mildly dilated.   GERD (gastroesophageal reflux disease)    Headache(784.0)    History of kidney stones    History of nephrolithiasis    Hypertension    NICM (nonischemic cardiomyopathy) (HCC)    a. LHC (08/2013):  no CAD   Sleep apnea    does not use machine   Stroke (HCC)    Type II or unspecified type diabetes mellitus without mention of complication, uncontrolled    borderline    Surgical History: Past Surgical History:  Procedure Laterality Date   ABDOMINAL HYSTERECTOMY     CATARACT EXTRACTION     COLONOSCOPY WITH PROPOFOL N/A 07/01/2019   Procedure: COLONOSCOPY WITH PROPOFOL;  Surgeon: West Bali, MD;  [facility];  Service: Endoscopy;  Laterality: N/A;  2:15pm - pt will not move up   CYSTOSCOPY WITH RETROGRADE PYELOGRAM, URETEROSCOPY AND STENT PLACEMENT Left 08/11/2021   Procedure: CYSTOSCOPY WITH RETROGRADE PYELOGRAM, URETEROSCOPY AND STENT PLACEMENT;  Surgeon: Malen Gauze, MD;  Location: [facility];  Service: Urology;  Laterality: Left;   CYSTOSCOPY WITH RETROGRADE PYELOGRAM, URETEROSCOPY AND  STENT PLACEMENT Left 09/08/2021   Procedure: CYSTOSCOPY WITH RETROGRADE PYELOGRAM, URETEROSCOPY AND STENT EXCHANGE;  Surgeon: Malen Gauze, MD;  Location: [facility];  Service: Urology;  Laterality: Left;   CYSTOSCOPY WITH RETROGRADE PYELOGRAM, URETEROSCOPY AND STENT PLACEMENT Right 10/06/2021   Procedure: CYSTOSCOPY WITH RETROGRADE PYELOGRAM, URETEROSCOPY AND STENT PLACEMENT;  Surgeon: Malen Gauze, MD;  Location: [facility];  Service: Urology;  Laterality: Right;   CYSTOSCOPY WITH RETROGRADE PYELOGRAM, URETEROSCOPY AND STENT PLACEMENT Right 10/27/2021   Procedure: CYSTOSCOPY WITH RETROGRADE PYELOGRAM, URETEROSCOPY AND STENT EXCHANGE;  Surgeon: Malen Gauze, MD;  Location: [facility];  Service: Urology;  Laterality: Right;   EXTRACORPOREAL SHOCK WAVE LITHOTRIPSY Left 05/31/2021   Procedure: EXTRACORPOREAL SHOCK WAVE LITHOTRIPSY (ESWL);  Surgeon: Malen Gauze, MD;  Location: [facility];  Service: Urology;  Laterality: Left;   EXTRACORPOREAL SHOCK WAVE LITHOTRIPSY Left 06/28/2021   Procedure: EXTRACORPOREAL SHOCK WAVE LITHOTRIPSY (ESWL);  Surgeon: Malen Gauze, MD;  Location: [facility];  Service: Urology;  Laterality: Left;   HOLMIUM LASER APPLICATION Left 08/11/2021   Procedure: HOLMIUM LASER APPLICATION;  Surgeon: Malen Gauze, MD;  Location: [facility];  Service: Urology;  Laterality: Left;   HOLMIUM LASER APPLICATION Left 09/08/2021   Procedure: HOLMIUM LASER APPLICATION;  Surgeon: Malen Gauze, MD;  Location: [facility];  Service: Urology;  Laterality: Left;   HOLMIUM LASER APPLICATION Right 10/06/2021   Procedure: HOLMIUM LASER APPLICATION;  Surgeon: Malen Gauze, MD;  Location: [facility];  Service: Urology;  Laterality: Right;   HOLMIUM LASER APPLICATION  Right 10/27/2021   Procedure: HOLMIUM LASER APPLICATION;  Surgeon: Malen Gauze, MD;  Location: [facility];  Service: Urology;  Laterality: Right;   LEFT AND RIGHT HEART CATHETERIZATION WITH CORONARY ANGIOGRAM N/A  09/03/2013   Procedure: LEFT AND RIGHT HEART CATHETERIZATION WITH CORONARY ANGIOGRAM;  Surgeon: Kathleene Hazel, MD;  Location: [facility];  Service: Cardiovascular;  Laterality: N/A;   OOPHORECTOMY     removed 1 ovary and 1 ovary remains.   POLYPECTOMY  07/01/2019   Procedure: POLYPECTOMY;  Surgeon: West Bali, MD;  [facility];  Service: Endoscopy;;   STONE EXTRACTION WITH BASKET Left 09/08/2021   Procedure: STONE EXTRACTION WITH BASKET;  Surgeon: Malen Gauze, MD;  Location: [facility];  Service: Urology;  Laterality: Left;   STONE EXTRACTION WITH BASKET Right 10/27/2021   Procedure: STONE EXTRACTION WITH BASKET;  Surgeon: Malen Gauze, MD;  Location: [facility];  Service: Urology;  Laterality: Right;   ureteral extraction of kidney stone      Home Medications:  Allergies as of 02/19/2023       Reactions   Levaquin [levofloxacin In D5w] Diarrhea   Penicillins Rash        Medication List        Accurate as of February 19, 2023 10:39 AM. If you have any questions, ask your nurse or doctor.          acetaminophen 500 MG tablet Commonly known as: TYLENOL Take 1,000 mg by mouth every 6 (six) hours as needed for moderate pain or headache.   albuterol 108 (90 Base) MCG/ACT inhaler Commonly known as: VENTOLIN HFA Inhale 2 puffs into the lungs every 6 (six) hours as needed for wheezing or shortness of breath.   amLODipine 10 MG tablet Commonly known as: NORVASC Take 1 tablet (10 mg total) by mouth daily.   apixaban 5 MG Tabs tablet Commonly known as: ELIQUIS Take 1 tablet (5 mg total) by mouth 2 (two) times daily. START AFTER STARTER PACK   bisoprolol 10 MG tablet Commonly known as: ZEBETA Take 1 tablet (10 mg total) by mouth daily.   Entresto 97-103 MG Generic drug: sacubitril-valsartan Take 1 tablet by mouth twice daily   glucose blood test strip Commonly known as: OneTouch Verio Use as instructed   OneTouch Delica Lancets Fine  Misc Use to check BG once daily   oxyCODONE-acetaminophen 5-325 MG tablet Commonly known as: Percocet Take 1 tablet by mouth every 4 (four) hours as needed for severe pain.   rosuvastatin 10 MG tablet Commonly known as: Crestor Take 1 tablet (10 mg total) by mouth daily.        Allergies:  Allergies  Allergen Reactions   Levaquin [Levofloxacin In D5w] Diarrhea   Penicillins Rash    Family History: Family History  Problem Relation Age of Onset   Hypertension Mother    Arthritis Mother    CAD Mother 68   Hip fracture Mother 5   COPD Father    Crohn's disease Sister    Cancer Sister        lung   Early death Brother 45       house fire   Diabetes Maternal Grandmother    Hypertension Sister    Hypertension Brother    Colon cancer Neg Hx     Social History:  reports that she has never smoked. She has never used smokeless tobacco. She reports current alcohol use. She reports that she does not use drugs.  ROS: All other review of systems were reviewed and are negative except what is noted above in HPI  Physical Exam: BP (!) 144/84   Pulse 85   LMP 01/22/1981 (LMP Unknown)   Constitutional:  Alert and oriented, No acute distress. HEENT: [place] AT, moist mucus membranes.  Trachea midline, no masses. Cardiovascular: No clubbing, cyanosis, or edema. Respiratory: Normal respiratory effort, no increased work of breathing. GI: Abdomen is soft, nontender, nondistended, no abdominal masses GU: No CVA tenderness.  Lymph: No cervical or inguinal lymphadenopathy. Skin: No rashes, bruises or suspicious lesions. Neurologic: Grossly intact, no focal deficits, moving all 4 extremities. Psychiatric: Normal mood and affect.  Laboratory Data: Lab Results  Component Value Date   WBC 8.5 10/23/2022   HGB 14.1 10/23/2022   HCT 44.1 10/23/2022   MCV 91.1 10/23/2022   PLT 227 10/23/2022    Lab Results  Component Value Date   CREATININE 1.20 (H) 01/04/2023    No results found  for: "PSA"  No results found for: "TESTOSTERONE"  Lab Results  Component Value Date   HGBA1C 7.5 (H) 11/16/2022    Urinalysis    Component Value Date/Time   COLORURINE YELLOW 12/16/2013 1120   APPEARANCEUR Clear 10/30/2022 1059   LABSPEC 1.013 12/16/2013 1120   PHURINE 6.5 12/16/2013 1120   GLUCOSEU Negative 10/30/2022 1059   HGBUR MODERATE (A) 12/16/2013 1120   BILIRUBINUR Negative 10/30/2022 1059   KETONESUR NEGATIVE 12/16/2013 1120   PROTEINUR 2+ (A) 10/30/2022 1059   PROTEINUR 30 (A) 12/16/2013 1120   UROBILINOGEN negative 07/24/2014 1519   UROBILINOGEN 0.2 12/16/2013 1120   NITRITE Negative 10/30/2022 1059   NITRITE NEGATIVE 12/16/2013 1120   LEUKOCYTESUR 2+ (A) 10/30/2022 1059    Lab Results  Component Value Date   LABMICR 206.7 11/16/2022   WBCUA >30 (A) 10/30/2022   LABEPIT 0-10 10/30/2022   MUCUS Present 12/09/2021   BACTERIA Few (A) 10/30/2022    Pertinent Imaging: CT hematuria 01/04/2023: Images reviewed and discussed with the patient Results for orders placed in visit on 09/19/22  DG Abd 1 View  Narrative CLINICAL DATA:  Nephrolithiasis.  EXAM: ABDOMEN - 1 VIEW  COMPARISON:  07/13/2021.  FINDINGS: The bowel gas pattern is normal. Numerous subcentimeter calcifications overlie both kidneys consistent with nephrolithiasis. Stones have decreased in size compared to prior study. Currently no stones are seen that are larger than 7 mm compared to the prior study when there was a 2.4 cm stone on the right.  IMPRESSION: Bilateral nephrolithiasis. Stones appear smaller than on the prior study.   Electronically Signed By: Layla Maw M.D. On: 09/24/2022 18:07  No results found for this or any previous visit.  No results found for this or any previous visit.  No results found for this or any previous visit.  Results for orders placed during the hospital encounter of 01/25/22  Ultrasound renal complete  Narrative CLINICAL DATA:   Nephrolithiasis.  EXAM: RENAL / URINARY TRACT ULTRASOUND COMPLETE  COMPARISON:  None Available.  FINDINGS: Right Kidney:  Renal measurements: 10.7 x 5.9 x 4.8 cm = volume: 160 mL. Contains a 12.8 mm nonobstructive stone. Contains 1.6 cm cyst. No follow-up imaging recommended for the cyst.  Left Kidney:  Renal measurements: 30.0 x 7.1 x 5.1 cm = volume: 244.6 mL. Contains a 7 mm nonobstructive stone. Contains 2 cysts measuring 1.9 and 0.7 cm. No follow-up imaging recommended for the cysts.  Bladder:  Appears normal for degree of bladder distention.  Other:  None.  IMPRESSION: Nonobstructive stones in both kidneys. No other significant abnormalities.   Electronically Signed By: Gerome Sam III M.D. On: 01/26/2022 11:08  No valid procedures specified. Results for orders placed in visit on 05/18/21  CT HEMATURIA WORKUP  Narrative CLINICAL DATA:  Hematuria over the last 3 weeks. History of diabetes and hypertension. Cystitis. Urinary incontinence.  EXAM: CT ABDOMEN AND PELVIS WITHOUT AND WITH CONTRAST  TECHNIQUE: Multidetector CT imaging of the abdomen and pelvis was performed following the standard protocol before and following the bolus administration of intravenous contrast.  RADIATION DOSE REDUCTION: This exam was performed according to the departmental dose-optimization program which includes automated exposure control, adjustment of the mA and/or kV according to patient size and/or use of iterative reconstruction technique.  CONTRAST:  OMNIPAQUE IOHEXOL 300 MG/ML  SOLN  COMPARISON:  CT abdomen 10/04/2001  FINDINGS: Lower chest: Mild scarring in the right middle lobe, right lower lobe, and lingula. 4 by 3 mm pleural-based nodule along the left lower lobe on image 61 series 15, not changed from 05/11/2020. Small to moderate-sized hiatal hernia. Mild cardiomegaly. Descending thoracic aortic atherosclerosis.  Hepatobiliary: Contracted  gallbladder.  Otherwise unremarkable.  Pancreas: Unremarkable  Spleen: Unremarkable  Adrenals/Urinary Tract: Both adrenal glands appear normal.  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 A branching 2.2 by 1.7 cm collecting system staghorn calculus in the right renal pelvis is associated with lower pole and mid kidney caliectasis  2.5 by 1.8 cm filling defect in the right mid to lower kidney infundibulum and calyx proximal to the stone on image 56 series 16 does not appear to enhance and accordingly favors plan thrombus over tumor. Similar filling defect is observed in much of the anterior kidney collecting system as shown for example on image 37 series 13. Within the blunted calices the presumed clot as outlined by excreted contrast.  There calcifications near the distal ureters but not within the distal ureters them cells. A separate right renal calculus is nonobstructive and measures 3 mm in diameter on image 34 series 2.  Which like the contralateral side, there is a staghorn calculus in the left mid kidney collecting system abutting the UPJ, measuring 1.7 cm in long axis. Release 5 additional nonobstructive renal calculi are present in the left kidney. There is no overt left hydronephrosis nor do we demonstrate additional casting filling defects in the left kidney. There is some mild left caliectasis particularly in upper pole example on image 32 series 13.  There is abnormal wall thickening in the right proximal ureter extending along 4.3 cm from the stone, possibly from inflammatory wall thickening, less likely to be from thrombus or tumor. Similar appearance on the left extending along 3.4 cm distal to the collecting system calculus in the left proximal ureter with only a thin channel of contrast in this vicinity.  Urinary bladder unremarkable.  Stomach/Bowel: Hiatal hernia.  Sigmoid colon diverticulosis.  Vascular/Lymphatic: Atherosclerosis is present,  including aortoiliac atherosclerotic disease and atherosclerotic calcifications in branches of the abdominal aorta.  Reproductive: Uterus absent.  Adnexa unremarkable.  Other: No supplemental non-categorized findings.  Musculoskeletal: Thoracic and lumbar spondylosis and degenerative disc disease.  IMPRESSION: 1. Bilateral staghorn calculi in the collecting systems extending to the UPJ on both sides. These are associated with abnormal thickening of the proximal ureters bilaterally, as well as mild bilateral hydronephrosis. 2. In the right renal collecting system, the large staghorn calculus is associated with abnormal noncalcified filling defect in anterior infundibula and calices probably from blood clot  given the fact that this abnormal density filling the infundibulum and calices is not appreciably enhancing. Difficult to exclude small amounts of tumor in this region given the substantially abnormal appearance. See for example image 37 series 13. 3. Renal atrophy and scarring with renal sinus lipomatosis. 4. Additional nonobstructive renal calculi are present bilaterally. 5. Stable 4 by 3 mm pleural-based nodule in the left lower lobe, not changed over the last year, thought to likely be benign. 6. Mild bibasilar scarring. 7. Other imaging findings of potential clinical significance: Small to moderate-sized hiatal hernia. Mild cardiomegaly. Sigmoid colon diverticulosis. Aortic Atherosclerosis (ICD10-I70.0). Systemic atherosclerosis. Thoracolumbar spondylosis and degenerative disc disease.   Electronically Signed By: Gaylyn Rong M.D. On: 05/23/2021 14:17  Results for orders placed during the hospital encounter of 12/02/21  CT RENAL STONE STUDY  Narrative CLINICAL DATA:  Nephrolithiasis, RIGHT flank pain for 4-5 months, history kidney stones, lithotripsy, stenting  EXAM: CT ABDOMEN AND PELVIS WITHOUT CONTRAST  TECHNIQUE: Multidetector CT imaging of the  abdomen and pelvis was performed following the standard protocol without IV contrast.  RADIATION DOSE REDUCTION: This exam was performed according to the departmental dose-optimization program which includes automated exposure control, adjustment of the mA and/or kV according to patient size and/or use of iterative reconstruction technique.  COMPARISON:  05/23/2021  FINDINGS: Lower chest: Minimal subsegmental atelectasis at lung bases  Hepatobiliary: Gallbladder and liver normal appearance  Pancreas: Atrophic pancreas without mass  Spleen: Normal appearance  Adrenals/Urinary Tract: Adrenal thickening without mass. BILATERAL cortical thinning. BILATERAL nonobstructing renal calculi up to 9 mm RIGHT in 5 mm LEFT. RIGHT ureteral stent. No hydronephrosis or definite renal mass. Questionable tiny calculus adjacent to the mid to distal RIGHT ureteral stent. Bladder unremarkable.  Stomach/Bowel: Normal appendix. Scattered stool in colon. Distal colonic diverticulosis without evidence of diverticulitis. Moderate-sized hiatal hernia. Remaining bowel loops unremarkable.  Vascular/Lymphatic: Atherosclerotic calcifications aorta, iliac arteries, coronary arteries. Aorta normal caliber. No adenopathy.  Reproductive: Uterus surgically absent. Unremarkable LEFT ovary with nonvisualization of RIGHT ovary.  Other: No free air or free fluid. No hernia or inflammatory process.  Musculoskeletal: Osseous demineralization.  IMPRESSION: BILATERAL nonobstructing renal calculi.  BILATERAL renal cortical atrophy.  RIGHT ureteral stent without hydronephrosis or hydroureter, though there is questionably a tiny calculus adjacent to the mid to distal portion of the RIGHT ureteral stent.  Aortic Atherosclerosis (ICD10-I70.0).   Electronically Signed By: Ulyses Southward M.D. On: 12/02/2021 10:17   Assessment & Plan:    1. NEPHROLITHIASIS, HX OF -continue surveillance. Followup 6 months with  [facility] - Urinalysis, Routine w reflex microscopic  2. Ureteral mass -if cytology is positive we will proceed with diagnostic ureteroscopy - Cytology, urine  3. OAB -vesicare 5mg    No follow-ups on file.  Wilkie Aye, MD  [facility]

## 2023-02-20 ENCOUNTER — Encounter: Payer: Self-pay | Admitting: Urology

## 2023-02-20 LAB — CYTOLOGY, URINE

## 2023-02-20 NOTE — Patient Instructions (Signed)

## 2023-02-27 ENCOUNTER — Telehealth: Payer: Self-pay

## 2023-02-27 NOTE — Telephone Encounter (Signed)
Patient requesting cytology results

## 2023-02-27 NOTE — Telephone Encounter (Signed)
Pt notified via My Chart

## 2023-02-27 NOTE — Telephone Encounter (Signed)
Patient is adking for specimen

## 2023-03-30 ENCOUNTER — Ambulatory Visit: Payer: Medicare HMO | Admitting: Urology

## 2023-05-09 ENCOUNTER — Ambulatory Visit: Payer: Medicare HMO | Admitting: Urology

## 2023-06-11 ENCOUNTER — Telehealth: Payer: Self-pay | Admitting: Family Medicine

## 2023-06-11 NOTE — Telephone Encounter (Signed)
 PATIENT IS COMPLETELY OUT AND WE HAVE NO SAMPLES. WHAT CAN PATIENT DO?

## 2023-06-11 NOTE — Telephone Encounter (Signed)
 Copied from CRM [id number]. Topic: Clinical - Medication Refill >> Jun 11, 2023  2:39 PM [name] wrote: Patient unable to pay deductible for eliquis yet.  Asking if she can get some samples until she can get contact with the insurance company.. Please call her at [phone].she is completely out.

## 2023-06-12 ENCOUNTER — Telehealth: Payer: Self-pay

## 2023-06-12 NOTE — Telephone Encounter (Signed)
 Patient aware and verbalized understanding.

## 2023-06-12 NOTE — Telephone Encounter (Signed)
 We do not have samples, she can call her Cardiologists to see if they have samples.

## 2023-06-12 NOTE — Telephone Encounter (Signed)
 Copied from CRM [id number]. Topic: Clinical - Medication Refill >> Jun 11, 2023  2:39 PM Emylou G wrote: Patient unable to pay deductible for eliquis yet.  Asking if she can get some samples until she can get contact with the insurance company.. Please call her at [phone].she is completely out. >> Jun 12, 2023 10:16 AM Gaetano Hawthorne wrote: Patient calling back to check in on whether we were able to touch base with Jannifer Rodney regarding prescribing something else - she spoke with someone yesterday who mentioned she would get a call back yesterday afternoon.

## 2023-06-12 NOTE — Telephone Encounter (Signed)
 SHE IS AWARE NO SAMPLES WANTS A MESSAGE TO SEND ON WHAT SHE NEEDS TO DO?

## 2023-06-13 ENCOUNTER — Telehealth: Payer: Self-pay | Admitting: Family Medicine

## 2023-06-13 NOTE — Telephone Encounter (Signed)
 Patient aware and verbalized understanding.

## 2023-06-14 ENCOUNTER — Emergency Department (HOSPITAL_BASED_OUTPATIENT_CLINIC_OR_DEPARTMENT_OTHER)

## 2023-06-14 ENCOUNTER — Other Ambulatory Visit: Payer: Self-pay

## 2023-06-14 ENCOUNTER — Emergency Department (HOSPITAL_BASED_OUTPATIENT_CLINIC_OR_DEPARTMENT_OTHER)
Admission: EM | Admit: 2023-06-14 | Discharge: 2023-06-14 | Disposition: A | Discharge status: Home / Self Care | Attending: Emergency Medicine | Admitting: Emergency Medicine

## 2023-06-14 ENCOUNTER — Encounter (HOSPITAL_BASED_OUTPATIENT_CLINIC_OR_DEPARTMENT_OTHER): Payer: Self-pay

## 2023-06-14 DIAGNOSIS — Z20822 Contact with and (suspected) exposure to covid-19: Secondary | ICD-10-CM | POA: Diagnosis not present

## 2023-06-14 DIAGNOSIS — J101 Influenza due to other identified influenza virus with other respiratory manifestations: Secondary | ICD-10-CM | POA: Diagnosis not present

## 2023-06-14 DIAGNOSIS — Z1152 Encounter for screening for COVID-19: Secondary | ICD-10-CM | POA: Diagnosis not present

## 2023-06-14 DIAGNOSIS — E119 Type 2 diabetes mellitus without complications: Secondary | ICD-10-CM | POA: Insufficient documentation

## 2023-06-14 DIAGNOSIS — J4531 Mild persistent asthma with (acute) exacerbation: Secondary | ICD-10-CM | POA: Diagnosis not present

## 2023-06-14 DIAGNOSIS — Z7901 Long term (current) use of anticoagulants: Secondary | ICD-10-CM | POA: Diagnosis not present

## 2023-06-14 DIAGNOSIS — Z6825 Body mass index (BMI) 25.0-25.9, adult: Secondary | ICD-10-CM | POA: Diagnosis not present

## 2023-06-14 DIAGNOSIS — R0989 Other specified symptoms and signs involving the circulatory and respiratory systems: Secondary | ICD-10-CM | POA: Diagnosis not present

## 2023-06-14 DIAGNOSIS — Z7951 Long term (current) use of inhaled steroids: Secondary | ICD-10-CM | POA: Diagnosis not present

## 2023-06-14 DIAGNOSIS — J45901 Unspecified asthma with (acute) exacerbation: Secondary | ICD-10-CM | POA: Diagnosis not present

## 2023-06-14 DIAGNOSIS — R0602 Shortness of breath: Secondary | ICD-10-CM | POA: Diagnosis not present

## 2023-06-14 DIAGNOSIS — Z79899 Other long term (current) drug therapy: Secondary | ICD-10-CM | POA: Insufficient documentation

## 2023-06-14 DIAGNOSIS — I10 Essential (primary) hypertension: Secondary | ICD-10-CM | POA: Diagnosis not present

## 2023-06-14 DIAGNOSIS — R059 Cough, unspecified: Secondary | ICD-10-CM | POA: Diagnosis not present

## 2023-06-14 MED ORDER — ACETAMINOPHEN 325 MG PO TABS
650.0000 mg | ORAL_TABLET | Freq: Once | ORAL | Status: AC | PRN
  Administered 2023-06-14: 650 mg via ORAL
  Filled 2023-06-14: qty 2

## 2023-06-14 MED ORDER — PREDNISONE 20 MG PO TABS: 40.0000 mg | ORAL_TABLET | Freq: Every day | ORAL | 0 refills | Status: AC

## 2023-06-14 MED ORDER — ACETAMINOPHEN 500 MG PO TABS: 1000.0000 mg | ORAL_TABLET | Freq: Once | ORAL | Status: DC

## 2023-06-14 MED ORDER — PREDNISONE 50 MG PO TABS
60.0000 mg | ORAL_TABLET | Freq: Once | ORAL | Status: AC
  Administered 2023-06-14: 60 mg via ORAL
  Filled 2023-06-14: qty 1

## 2023-06-14 MED ORDER — ALBUTEROL SULFATE HFA 108 (90 BASE) MCG/ACT IN AERS: 2.0000 | INHALATION_SPRAY | RESPIRATORY_TRACT | Status: DC | PRN

## 2023-06-14 MED ORDER — IPRATROPIUM-ALBUTEROL 0.5-2.5 (3) MG/3ML IN SOLN
3.0000 mL | Freq: Once | RESPIRATORY_TRACT | Status: AC
  Administered 2023-06-14: 3 mL via RESPIRATORY_TRACT

## 2023-06-14 MED ORDER — ALBUTEROL SULFATE HFA 108 (90 BASE) MCG/ACT IN AERS
2.0000 | INHALATION_SPRAY | Freq: Once | RESPIRATORY_TRACT | Status: AC
  Administered 2023-06-14: 2 via RESPIRATORY_TRACT
  Filled 2023-06-14: qty 6.7

## 2023-06-14 NOTE — ED Triage Notes (Addendum)
 Flu symptoms and SOB began yesterday. Went to [facility] and tested positive for flu. Sore throat, cough and chest tightness

## 2023-06-14 NOTE — ED Provider Notes (Signed)
 [name] Provider Note   CSN: [id number] Arrival date & time: 06/14/23  1613     History  Chief Complaint  Patient presents with   Shortness of Breath    [name] is a 78 y.o. female.  Patient here with ongoing cough shortness of breath diagnosed with flu yesterday.  She has had symptoms for 24 hours.  On Eliquis history of asthma.  Using inhaler with good relief.  Denies any active chest pain shortness of breath weakness numbness tingling.  No nausea vomiting diarrhea.  History of diabetes heart failure hypertension.  The history is provided by the patient.       Home Medications Prior to Admission medications   Medication Sig Start Date End Date Taking? Authorizing Provider  predniSONE (DELTASONE) 20 MG tablet Take 2 tablets (40 mg total) by mouth daily for 3 days. 06/14/23 06/17/23 Yes Aanya Haynes, DO  acetaminophen (TYLENOL) 500 MG tablet Take 1,000 mg by mouth every 6 (six) hours as needed for moderate pain or headache.    [provider]  albuterol (VENTOLIN HFA) 108 (90 Base) MCG/ACT inhaler Inhale 2 puffs into the lungs every 6 (six) hours as needed for wheezing or shortness of breath. 11/16/22   Jannifer Rodney A, FNP  amLODipine (NORVASC) 10 MG tablet Take 1 tablet (10 mg total) by mouth daily. 05/09/21   Junie Spencer, FNP  apixaban (ELIQUIS) 5 MG TABS tablet Take 1 tablet (5 mg total) by mouth 2 (two) times daily. START AFTER STARTER PACK 11/16/22   Jannifer Rodney A, FNP  bisoprolol (ZEBETA) 10 MG tablet Take 1 tablet (10 mg total) by mouth daily. 11/16/22   Junie Spencer, FNP  glucose blood (ONETOUCH VERIO) test strip Use as instructed 12/24/13   Junie Spencer, FNP  [facility] DELICA LANCETS FINE MISC Use to check BG once daily 01/19/14   Eckard, Tammy, RPH-CPP  oxyCODONE-acetaminophen (PERCOCET) 5-325 MG tablet Take 1 tablet by mouth every 4 (four) hours as needed for severe pain. 09/20/22 09/20/23  Malen Gauze, MD  rosuvastatin (CRESTOR) 10 MG tablet Take 1 tablet (10 mg total) by mouth daily. Patient not taking: Reported on 12/06/2022 05/06/21   Junie Spencer, FNP  sacubitril-valsartan (ENTRESTO) 97-103 MG Take 1 tablet by mouth twice daily 04/27/22   Rollene Rotunda, MD  solifenacin (VESICARE) 5 MG tablet Take 1 tablet (5 mg total) by mouth daily. 02/19/23   McKenzie, Mardene Celeste, MD  escitalopram (LEXAPRO) 20 MG tablet Take 1 tablet (20 mg total) by mouth daily. Patient not taking: Reported on 05/10/2020 02/07/19 05/11/20  Junie Spencer, FNP      Allergies    Levaquin [levofloxacin in d5w] and Penicillins    Review of Systems   Review of Systems  Physical Exam Updated Vital Signs BP (!) 154/70   Pulse 75   Temp 100.2 F (37.9 C) (Oral)   Resp 16   Wt 70.3 kg   LMP 01/22/1981 (LMP Unknown)   SpO2 92%   BMI 26.61 kg/m  Physical Exam Vitals and nursing note reviewed.  Constitutional:      General: She is not in acute distress.    Appearance: She is well-developed. She is not ill-appearing.  HENT:     Head: Normocephalic and atraumatic.     Mouth/Throat:     Mouth: Mucous membranes are moist.  Eyes:     Extraocular Movements: Extraocular movements intact.     Conjunctiva/sclera:  Conjunctivae normal.     Pupils: Pupils are equal, round, and reactive to light.  Cardiovascular:     Rate and Rhythm: Normal rate and regular rhythm.     Pulses: Normal pulses.     Heart sounds: Normal heart sounds. No murmur heard. Pulmonary:     Effort: Pulmonary effort is normal. No respiratory distress.     Breath sounds: Wheezing present.  Abdominal:     Palpations: Abdomen is soft.     Tenderness: There is no abdominal tenderness.  Musculoskeletal:        General: No swelling.     Cervical back: Normal range of motion and neck supple.  Skin:    General: Skin is warm and dry.     Capillary Refill: Capillary refill takes less than 2 seconds.  Neurological:     Mental Status: She  is alert.  Psychiatric:        Mood and Affect: Mood normal.     ED Results / Procedures / Treatments   Labs (all labs ordered are listed, but only abnormal results are displayed) Labs Reviewed - No data to display  EKG EKG Interpretation Date/Time:  Thursday June 14 2023 16:29:42 EST Ventricular Rate:  79 PR Interval:  112 QRS Duration:  103 QT Interval:  418 QTC Calculation: 480 R Axis:   -82  Text Interpretation: Ectopic atrial rhythm Borderline short PR interval Confirmed by Virgina Norfolk [phone]) on 06/14/2023 4:30:50 PM  Radiology No results found.  Procedures Procedures    Medications Ordered in ED Medications  acetaminophen (TYLENOL) tablet 650 mg (650 mg Oral Given 06/14/23 1629)  ipratropium-albuterol (DUONEB) 0.5-2.5 (3) MG/3ML nebulizer solution 3 mL (3 mLs Nebulization Given 06/14/23 1702)  predniSONE (DELTASONE) tablet 60 mg (60 mg Oral Given 06/14/23 1705)    ED Course/ Medical Decision Making/ A&P                                 Medical Decision Making Amount and/or Complexity of Data Reviewed Radiology: ordered.  Risk OTC drugs. Prescription drug management.   [name] is here with ongoing flu symptoms.  Patient febrile but otherwise unremarkable vitals.  She is got some wheezing on exam.  History of asthma hypertension.  She has had flu symptoms now for 2 days.  Tested positive for flu yesterday.  Overall I think she likely has a mild asthma exacerbation from this.  Have no concern for ACS PE other acute process will check for may be pneumonia with a chest x-ray although seems too early for something like that.  Will give breathing treatment steroids and reevaluate.  Patient feeling much better after breathing treatment and steroids.  Fever has improved to 99 on my review.  I will see any obvious pneumonia pneumothorax on chest x-ray.  Overall I think she has influenza in the setting of a mild asthma exacerbation.  Will continue prednisone at  home given some nausea symptoms will hold on any Tamiflu as I do not want to exacerbate those symptoms.  She will follow-up with her primary care doctor return if symptoms worsen.  Discharged in good condition.  This chart was dictated using voice recognition software.  Despite best efforts to proofread,  errors can occur which can change the documentation meaning.         Final Clinical Impression(s) / ED Diagnoses Final diagnoses:  Influenza A  Mild asthma with exacerbation, unspecified whether  persistent    Rx / DC Orders ED Discharge Orders          Ordered    predniSONE (DELTASONE) 20 MG tablet  Daily        06/14/23 1905              Virgina Norfolk, DO 06/14/23 1906

## 2023-06-14 NOTE — Discharge Instructions (Signed)
 Continue 1000 mg of Tylenol every 6 hours as needed for fever.  Continue 2 to 4 puffs of your albuterol inhaler every 4-6 hours as needed.  Take your next dose of prednisone tomorrow.  Return if symptoms worsen as we discussed.

## 2023-06-18 ENCOUNTER — Telehealth: Payer: Self-pay | Admitting: Family

## 2023-06-18 ENCOUNTER — Telehealth (INDEPENDENT_AMBULATORY_CARE_PROVIDER_SITE_OTHER): Admitting: Family

## 2023-06-18 ENCOUNTER — Encounter: Payer: Self-pay | Admitting: Family

## 2023-06-18 DIAGNOSIS — J101 Influenza due to other identified influenza virus with other respiratory manifestations: Secondary | ICD-10-CM

## 2023-06-18 DIAGNOSIS — J4520 Mild intermittent asthma, uncomplicated: Secondary | ICD-10-CM | POA: Diagnosis not present

## 2023-06-18 MED ORDER — ALBUTEROL SULFATE (2.5 MG/3ML) 0.083% IN NEBU: 2.5000 mg | INHALATION_SOLUTION | Freq: Four times a day (QID) | RESPIRATORY_TRACT | 1 refills | Status: DC | PRN

## 2023-06-18 MED ORDER — PREDNISONE 10 MG (21) PO TBPK
ORAL_TABLET | ORAL | 0 refills | Status: DC
Start: 2023-06-18 — End: 2023-10-03

## 2023-06-18 NOTE — Telephone Encounter (Signed)
 Copied from CRM [id number]. Topic: Clinical - Medication Refill >> Jun 18, 2023 11:01 AM Eula Fried wrote: Most Recent Primary Care Visit:   Medication: PREDNISON  Has the patient contacted their pharmacy? Yes (Agent: If no, request that the patient contact the pharmacy for the refill. If patient does not wish to contact the pharmacy document the reason why and proceed with request.) (Agent: If yes, when and what did the pharmacy advise?)  Is this the correct pharmacy for this prescription? Yes If no, delete pharmacy and type the correct one.  This is the patient's preferred pharmacy:  [facility] [address] [place] - [address] [place] [address] [place] [address] Phone: [phone] Fax: [phone]    Has the prescription been filled recently? No  Is the patient out of the medication? Yes  Has the patient been seen for an appointment in the last year OR does the patient have an upcoming appointment? Yes  Can we respond through MyChart? Yes  Agent: Please be advised that Rx refills may take up to 3 business days. We ask that you follow-up with your pharmacy.

## 2023-06-18 NOTE — Telephone Encounter (Signed)
Called patient appt made

## 2023-06-18 NOTE — Progress Notes (Signed)
 Virtual Visit Consent   [name], you are scheduled for a virtual visit with a [facility] provider today. Just as with appointments in the office, your consent must be obtained to participate. Your consent will be active for this visit and any virtual visit you may have with one of our providers in the next 365 days. If you have a MyChart account, a copy of this consent can be sent to you electronically.  As this is a virtual visit, video technology does not allow for your provider to perform a traditional examination. This may limit your provider's ability to fully assess your condition. If your provider identifies any concerns that need to be evaluated in person or the need to arrange testing (such as labs, EKG, etc.), we will make arrangements to do so. Although advances in technology are sophisticated, we cannot ensure that it will always work on either your end or our end. If the connection with a video visit is poor, the visit may have to be switched to a telephone visit. With either a video or telephone visit, we are not always able to ensure that we have a secure connection.  By engaging in this virtual visit, you consent to the provision of healthcare and authorize for your insurance to be billed (if applicable) for the services provided during this visit. Depending on your insurance coverage, you may receive a charge related to this service.  I need to obtain your verbal consent now. Are you willing to proceed with your visit today? [name] has provided verbal consent on 06/18/2023 for a virtual visit (video or telephone). Jannifer Rodney, FNP  Date: 06/18/2023 1:54 PM   Virtual Visit via Video Note   I, Jannifer Rodney, connected with  [name]  ([id number], [birth year]) on 06/18/23 at 11:25 AM EDT by a video-enabled telemedicine application and verified that I am speaking with the correct person using two identifiers.  Location: Patient: Virtual Visit Location  Patient: Home Provider: Virtual Visit Location Provider: Home Office   I discussed the limitations of evaluation and management by telemedicine and the availability of in person appointments. The patient expressed understanding and agreed to proceed.    History of Present Illness: [name] is a 78 y.o. who identifies as a female who was assigned female at birth, and is being seen today for flu A. She went to the ED on 06/14/23 and diagnosed with Flu. She was given prednisone.   She has asthma and has continued to have wheezing and SOB.   HPI: Wheezing  This is a new problem. The current episode started 1 to 4 weeks ago. The problem has been unchanged. Associated symptoms include chills, coughing, ear pain, a fever, rhinorrhea, shortness of breath, a sore throat and sputum production. She has tried rest for the symptoms. The treatment provided mild relief. Her past medical history is significant for asthma.    Problems:  Patient Active Problem List   Diagnosis Date Noted   Aortic atherosclerosis (HCC) 07/31/2022   Deep vein thrombosis (DVT) of proximal lower extremity (HCC) 07/31/2022   Leg swelling 03/15/2022   Prolonged QT interval 03/11/2019   Hyperlipidemia associated with type 2 diabetes mellitus (HCC) 02/07/2019   Snoring 02/06/2018   Vitamin D deficiency 07/30/2014   Osteopenia with high risk of fracture 07/30/2014   Left-sided Bell's palsy 01/16/2014   Late effect of cerebrovascular accident (CVA) 12/16/2013   NICM (nonischemic cardiomyopathy), Echo 08/26/13-EF 25-30%  09/17/2013   Chronic  systolic heart failure (HCC) 09/17/2013   Type 2 diabetes mellitus with other specified complication (HCC) 05/05/2010   Essential hypertension 11/12/2007   Asthma 11/12/2007   GERD 11/12/2007   NEPHROLITHIASIS, HX OF 11/12/2007    Allergies:  Allergies  Allergen Reactions   Levaquin [Levofloxacin In D5w] Diarrhea   Penicillins Rash   Medications:  Current Outpatient  Medications:    albuterol (PROVENTIL) (2.5 MG/3ML) 0.083% nebulizer solution, Take 3 mLs (2.5 mg total) by nebulization every 6 (six) hours as needed for wheezing or shortness of breath., Disp: 150 mL, Rfl: 1   predniSONE (STERAPRED UNI-PAK 21 TAB) 10 MG (21) TBPK tablet, Use as directed, Disp: 21 tablet, Rfl: 0   acetaminophen (TYLENOL) 500 MG tablet, Take 1,000 mg by mouth every 6 (six) hours as needed for moderate pain or headache., Disp: , Rfl:    albuterol (VENTOLIN HFA) 108 (90 Base) MCG/ACT inhaler, Inhale 2 puffs into the lungs every 6 (six) hours as needed for wheezing or shortness of breath., Disp: 18 g, Rfl: 0   amLODipine (NORVASC) 10 MG tablet, Take 1 tablet (10 mg total) by mouth daily., Disp: 90 tablet, Rfl: 2   apixaban (ELIQUIS) 5 MG TABS tablet, Take 1 tablet (5 mg total) by mouth 2 (two) times daily. START AFTER STARTER PACK, Disp: 60 tablet, Rfl: 3   bisoprolol (ZEBETA) 10 MG tablet, Take 1 tablet (10 mg total) by mouth daily., Disp: 90 tablet, Rfl: 1   glucose blood (ONETOUCH VERIO) test strip, Use as instructed, Disp: 100 each, Rfl: 12   ONETOUCH DELICA LANCETS FINE MISC, Use to check BG once daily, Disp: 100 each, Rfl: 2   oxyCODONE-acetaminophen (PERCOCET) 5-325 MG tablet, Take 1 tablet by mouth every 4 (four) hours as needed for severe pain., Disp: 30 tablet, Rfl: 0   rosuvastatin (CRESTOR) 10 MG tablet, Take 1 tablet (10 mg total) by mouth daily. (Patient not taking: Reported on 12/06/2022), Disp: 90 tablet, Rfl: 3   sacubitril-valsartan (ENTRESTO) 97-103 MG, Take 1 tablet by mouth twice daily, Disp: 90 tablet, Rfl: 3   solifenacin (VESICARE) 5 MG tablet, Take 1 tablet (5 mg total) by mouth daily., Disp: 30 tablet, Rfl: 3  Observations/Objective: Patient is well-developed, well-nourished in no acute distress.  Resting comfortably  at home.  Head is normocephalic, atraumatic.  No labored breathing.  Speech is clear and coherent with logical content.  Patient is alert and  oriented at baseline.  Wheezing, dry nonproductive cough  Assessment and Plan: 1. Influenza A (Primary) - predniSONE (STERAPRED UNI-PAK 21 TAB) 10 MG (21) TBPK tablet; Use as directed  Dispense: 21 tablet; Refill: 0 - albuterol (PROVENTIL) (2.5 MG/3ML) 0.083% nebulizer solution; Take 3 mLs (2.5 mg total) by nebulization every 6 (six) hours as needed for wheezing or shortness of breath.  Dispense: 150 mL; Refill: 1 - For home use only DME Nebulizer machine  2. Mild intermittent asthma, unspecified whether complicated - predniSONE (STERAPRED UNI-PAK 21 TAB) 10 MG (21) TBPK tablet; Use as directed  Dispense: 21 tablet; Refill: 0 - albuterol (PROVENTIL) (2.5 MG/3ML) 0.083% nebulizer solution; Take 3 mLs (2.5 mg total) by nebulization every 6 (six) hours as needed for wheezing or shortness of breath.  Dispense: 150 mL; Refill: 1 - For home use only DME Nebulizer machine  - Take meds as prescribed - Use a cool mist humidifier  -Use saline nose sprays frequently -Force fluids -For any cough or congestion  Use plain Mucinex- regular strength or max strength is fine -  For fever or aces or pains- take tylenol or ibuprofen. -Throat lozenges if help If symptoms continue will need chest x-ray to rule out pneumonia -Follow up if symptoms worsen or do not improve   Follow Up Instructions: I discussed the assessment and treatment plan with the patient. The patient was provided an opportunity to ask questions and all were answered. The patient agreed with the plan and demonstrated an understanding of the instructions.  A copy of instructions were sent to the patient via MyChart unless otherwise noted below.     The patient was advised to call back or seek an in-person evaluation if the symptoms worsen or if the condition fails to improve as anticipated.    Jannifer Rodney, FNP

## 2023-06-18 NOTE — Telephone Encounter (Signed)
 Copied from CRM [id number]. Topic: Clinical - Prescription Issue >> Jun 18, 2023  4:54 PM Albin Felling L wrote: Reason for CRM: Patient does not have nebulizing machine and having hard time getting one. Patient unable to use albuterol nebulizing solution.  Requesting assistance with this,  Best call back number: [phone]

## 2023-06-18 NOTE — Telephone Encounter (Signed)
 Please make her an appointment. It can be a virtual if she needs it.

## 2023-06-18 NOTE — Telephone Encounter (Signed)
 Patient went to ED dx with Flu A patient states she can not breath asking if prednisone can be called in

## 2023-06-19 ENCOUNTER — Telehealth: Payer: Self-pay

## 2023-06-19 NOTE — Telephone Encounter (Signed)
 Patient aware and verbalized understanding.

## 2023-06-19 NOTE — Telephone Encounter (Signed)
 Called and spoke with patient.

## 2023-06-19 NOTE — Telephone Encounter (Signed)
 Copied from CRM [id number]. Topic: General - Other >> Jun 19, 2023  9:48 AM Macon Large wrote: Reason for CRM: Patient requests that Morrie Sheldon return her call at [phone]

## 2023-06-19 NOTE — Telephone Encounter (Unsigned)
 Copied from CRM [id number]. Topic: Clinical - Medical Advice >> Jun 19, 2023 11:08 AM Gildardo Pounds wrote: Reason for CRM: Patient is calling for Morrie Sheldon to have her assist with getting nebulizer machine. Patient states that [place] is giving her issues. Callback number is [phone]

## 2023-06-19 NOTE — Telephone Encounter (Signed)
 Lmtcb

## 2023-06-19 NOTE — Telephone Encounter (Signed)
Lmtcb [name]  

## 2023-06-28 ENCOUNTER — Ambulatory Visit: Admitting: Nurse Practitioner

## 2023-06-28 ENCOUNTER — Encounter: Payer: Self-pay | Admitting: Nurse Practitioner

## 2023-06-28 VITALS — BP 147/82 | HR 44 | Temp 97.5°F | Ht 64.0 in | Wt 157.0 lb

## 2023-06-28 DIAGNOSIS — R6889 Other general symptoms and signs: Secondary | ICD-10-CM

## 2023-06-28 DIAGNOSIS — R059 Cough, unspecified: Secondary | ICD-10-CM | POA: Diagnosis not present

## 2023-06-28 LAB — VERITOR FLU A/B WAIVED
Influenza A: NEGATIVE
Influenza B: NEGATIVE

## 2023-06-28 LAB — RSV AG, IMMUNOCHR, WAIVED: RSV Ag, Immunochr, Waived: NEGATIVE

## 2023-06-28 MED ORDER — AZITHROMYCIN 250 MG PO TABS: ORAL_TABLET | ORAL | 0 refills | Status: DC

## 2023-06-28 NOTE — Patient Instructions (Addendum)

## 2023-06-28 NOTE — Progress Notes (Signed)
 Subjective:    Patient ID: [name], female    DOB: [birth year], 78 y.o.   MRN: [id number]   Chief Complaint: Influenza (Pt was diagnosed with the flu 8 days ago. Pt started to feel better but then yesterday she started feeling worse. Sore throat, body aches, fatigue, dizzy.)   Went to ER 8 dya s ago and tested positive for type A influenza. Was treated with predninsone, nebulizer but no tamiflu.  Influenza This is a new problem. The current episode started 1 to 4 weeks ago. The problem occurs 2 to 4 times per day. Associated symptoms include congestion, coughing and myalgias. Pertinent negatives include no chest pain, chills or fever. Nothing aggravates the symptoms. Treatments tried: prednisone. The treatment provided mild relief.    Patient Active Problem List   Diagnosis Date Noted   Aortic atherosclerosis (HCC) 07/31/2022   Deep vein thrombosis (DVT) of proximal lower extremity (HCC) 07/31/2022   Leg swelling 03/15/2022   Prolonged QT interval 03/11/2019   Hyperlipidemia associated with type 2 diabetes mellitus (HCC) 02/07/2019   Snoring 02/06/2018   Vitamin D deficiency 07/30/2014   Osteopenia with high risk of fracture 07/30/2014   Left-sided Bell's palsy 01/16/2014   Late effect of cerebrovascular accident (CVA) 12/16/2013   NICM (nonischemic cardiomyopathy), Echo 08/26/13-EF 25-30%  09/17/2013   Chronic systolic heart failure (HCC) 09/17/2013   Type 2 diabetes mellitus with other specified complication (HCC) 05/05/2010   Essential hypertension 11/12/2007   Asthma 11/12/2007   GERD 11/12/2007   NEPHROLITHIASIS, HX OF 11/12/2007       Review of Systems  Constitutional:  Negative for chills and fever.  HENT:  Positive for congestion. Negative for sinus pressure and sinus pain.   Respiratory:  Positive for cough and shortness of breath.   Cardiovascular:  Negative for chest pain.  Musculoskeletal:  Positive for myalgias.       Objective:   Physical  Exam Constitutional:      Appearance: Normal appearance.  HENT:     Right Ear: Tympanic membrane normal.     Left Ear: Tympanic membrane normal.     Nose: Congestion and rhinorrhea present.     Mouth/Throat:     Pharynx: No oropharyngeal exudate or posterior oropharyngeal erythema.  Cardiovascular:     Rate and Rhythm: Normal rate and regular rhythm.     Pulses: Normal pulses.     Heart sounds: Normal heart sounds.  Pulmonary:     Breath sounds: Normal breath sounds.  Musculoskeletal:     Cervical back: Normal range of motion and neck supple.  Neurological:     General: No focal deficit present.     Mental Status: She is alert.  Psychiatric:        Mood and Affect: Mood normal.        Behavior: Behavior normal.    BP (!) 147/82   Pulse (!) 44   Temp (!) 97.5 F (36.4 C)   Ht 5\' 4"  (1.626 m)   Wt 157 lb (71.2 kg)   LMP 01/22/1981 (LMP Unknown)   SpO2 95%   BMI 26.95 kg/m         Assessment & Plan:  ARNELLA PRALLE in today with chief complaint of Influenza (Pt was diagnosed with the flu 8 days ago. Pt started to feel better but then yesterday she started feeling worse. Sore throat, body aches, fatigue, dizzy.)   1. Flu-like symptoms (Primary) - Veritor Flu A/B Waived  2. Cough in  adult 1. Take meds as prescribed 2. Use a cool mist humidifier especially during the winter months and when heat has been humid. 3. Use saline nose sprays frequently 4. Saline irrigations of the nose can be very helpful if done frequently.  * 4X daily for 1 week*  * Use of a nettie pot can be helpful with this. Follow directions with this* 5. Drink plenty of fluids 6. Keep thermostat turn down low 7.For any cough or congestion- mucinex 8. For fever or aces or pains- take tylenol or ibuprofen appropriate for age and weight.  * for fevers greater than 101 orally you may alternate ibuprofen and tylenol every  3 hours.    - RSV Ag, Immunochr, Waived  Meds ordered this encounter   Medications   azithromycin (ZITHROMAX Z-PAK) 250 MG tablet    Sig: As directed    Dispense:  6 tablet    Refill:  0    Supervising Provider:   Arville Care A [[id number]]     The above assessment and management plan was discussed with the patient. The patient verbalized understanding of and has agreed to the management plan. Patient is aware to call the clinic if symptoms persist or worsen. Patient is aware when to return to the clinic for a follow-up visit. Patient educated on when it is appropriate to go to the emergency department.   Mary-Margaret Daphine Deutscher, FNP

## 2023-07-04 ENCOUNTER — Ambulatory Visit: Payer: Medicare HMO | Admitting: Urology

## 2023-08-23 NOTE — Progress Notes (Unsigned)
 Cardiology Office Note:   Date:  08/24/2023  ID:  [name], DOB 04-28-1945, MRN [id number] PCP: Yevette Hem, FNP   HeartCare Providers Cardiologist:  Eilleen Grates, MD {  History of Present Illness:   [name] is a 78 y.o. female who presents  for ongoing assessment and management of hypertension, chronic dyspnea, nonischemic cardiomyopathy, with probable OSA.   In August 2023 she was found to have DVT.  This was in the right popliteal and femoral veins.  Since I last saw her she has been complaining of some jaw discomfort.  This is happening sporadically.  It is a deep aching in her upper jaw.  Seems to happen at rest.  It is lasting 1 to 3 minutes and 7 out of 10 in intensity.  It has been happening a couple of times a week.  There is associated nausea.  She does get a little more exercise intolerance and has actually had a little discomfort with activity in her mid chest.  She has not had this before.  She is not having any arm discomfort.  She is not having any new PND or orthopnea.  She has had no new palpitations, presyncope or syncope.  ROS: As stated in the HPI and negative for all other systems.  Studies Reviewed:    EKG:   EKG Interpretation Date/Time:  Friday Aug 24 2023 11:40:27 EDT Ventricular Rate:  78 PR Interval:  180 QRS Duration:  100 QT Interval:  350 QTC Calculation: 399 R Axis:   266  Text Interpretation: Normal sinus rhythm Right superior axis deviation Pulmonary disease pattern Incomplete right bundle branch block Biventricular hypertrophy Nonspecific T wave abnormality When compared with ECG of 14-Jun-2023 16:29, No significant change since last tracing Confirmed by Eilleen Grates ([id number]) on 08/24/2023 11:45:40 AM    Risk Assessment/Calculations:         Physical Exam:   VS:  BP (!) 158/90   Pulse 84   Ht 5\' 4"  (1.626 m)   Wt 157 lb (71.2 kg)   LMP 01/22/1981 (LMP Unknown)   SpO2 96%   BMI 26.95 kg/m    Wt Readings from  Last 3 Encounters:  08/24/23 157 lb (71.2 kg)  06/28/23 157 lb (71.2 kg)  06/14/23 155 lb (70.3 kg)     GEN: Well nourished, well developed in no acute distress NECK: No JVD; No carotid bruits CARDIAC: RRR, no murmurs, rubs, gallops RESPIRATORY:  Clear to auscultation without rales, wheezing or rhonchi  ABDOMEN: Soft, non-tender, non-distended EXTREMITIES: Chronic right lower extremity calf edema; No deformity   ASSESSMENT AND PLAN:   Hypertension: Her blood pressure is elevated.  She is going to keep a blood pressure diary.  She might need to have low-dose hydralazine  added if her blood pressure is high.  Jaw pain: I am worried that this is an anginal equivalent.   I am going to send her for coronary CTA.  She has had known coronary calcium  in significant risk factors.  Nonischemic cardiomyopathy:   EF improved from 30 to 65% in 2020.   She has no symptoms of heart failure.  No change in therapy.  DVT: This seems to be unprovoked.  I am going to continue anticoagulation.   She has had some chronic right lower extremity swelling and we talked about compression socks.     Aortic atherosclerosis:.  She is going to continue with aggressive risk reduction.  Likely she will need a higher dose of Crestor   once I see any plaque burden in her coronaries.  She and I talked about this.    Dyslipidemia: Goals of therapy will be pending the results of the CT.    Follow up with me in one year or sooner based on the results of the above  Signed, Eilleen Grates, MD

## 2023-08-24 ENCOUNTER — Ambulatory Visit (INDEPENDENT_AMBULATORY_CARE_PROVIDER_SITE_OTHER): Admitting: Cardiology

## 2023-08-24 ENCOUNTER — Encounter: Payer: Self-pay | Admitting: Cardiology

## 2023-08-24 ENCOUNTER — Other Ambulatory Visit (HOSPITAL_COMMUNITY): Payer: Self-pay

## 2023-08-24 ENCOUNTER — Other Ambulatory Visit: Payer: Self-pay | Admitting: *Deleted

## 2023-08-24 ENCOUNTER — Other Ambulatory Visit: Payer: Self-pay | Admitting: Family

## 2023-08-24 VITALS — BP 158/90 | HR 84 | Ht 64.0 in | Wt 157.0 lb

## 2023-08-24 DIAGNOSIS — I10 Essential (primary) hypertension: Secondary | ICD-10-CM

## 2023-08-24 DIAGNOSIS — I200 Unstable angina: Secondary | ICD-10-CM

## 2023-08-24 DIAGNOSIS — I428 Other cardiomyopathies: Secondary | ICD-10-CM | POA: Diagnosis not present

## 2023-08-24 DIAGNOSIS — I6930 Unspecified sequelae of cerebral infarction: Secondary | ICD-10-CM

## 2023-08-24 DIAGNOSIS — I700 Atherosclerosis of aorta: Secondary | ICD-10-CM

## 2023-08-24 DIAGNOSIS — I824Y9 Acute embolism and thrombosis of unspecified deep veins of unspecified proximal lower extremity: Secondary | ICD-10-CM

## 2023-08-24 MED ORDER — APIXABAN 5 MG PO TABS
5.0000 mg | ORAL_TABLET | Freq: Two times a day (BID) | ORAL | 6 refills | Status: DC
  Filled 2023-08-24: qty 60, 30d supply, fill #0

## 2023-08-24 MED ORDER — METOPROLOL TARTRATE 100 MG PO TABS
100.0000 mg | ORAL_TABLET | Freq: Once | ORAL | 0 refills | Status: DC
Start: 2023-08-27 — End: 2023-10-03

## 2023-08-24 NOTE — Telephone Encounter (Signed)
 Request eliquis  be sent to [facility].

## 2023-08-24 NOTE — Patient Instructions (Addendum)
 Medication Instructions:  Your physician recommends that you continue on your current medications as directed. Please refer to the Current Medication list given to you today.  Labwork: BMET (due 1-2 weeks within CT) [facility]-schedule an appointment at your family doctor to have this done before your CT  Testing/Procedures: Coronary CTA-see instructions below  Follow-Up: Your physician recommends that you schedule a follow-up appointment in: 1 year. You will receive a reminder call in about 8-10 months reminding you to schedule your appointment. If you don't receive this call, please contact our office.  Any Other Special Instructions Will Be Listed Below (If Applicable).   If you need a refill on your cardiac medications before your next appointment, please call your pharmacy.    Your cardiac CT will be scheduled at one of the below locations:   [facility] [address] [place], [place] [address] [phone]  Or  [facility] and Vascular Tower [address]  [place], [place] [address] Opening August 06, 2023  If scheduled at [facility], please arrive at the [facility] and Children's Entrance (Entrance C2) of [facility] 30 minutes prior to test start time. You can use the FREE valet parking offered at entrance C (encouraged to control the heart rate for the test)  Proceed to the [facility] Radiology Department (first floor) to check-in and test prep.   All radiology patients and guests should use entrance C2 at [facility], accessed from [facility], even though the hospital's physical address listed is [address].    If scheduled at the Heart and Vascular Tower at [address], please enter the parking lot using the [address] entrance and use the FREE valet service at the patient drop-off area. Enter the buidling and check-in with registration on the main floor.  Please follow these  instructions carefully (unless otherwise directed):  An IV will be required for this test and Nitroglycerin  will be given.   On the Night Before the Test: Be sure to Drink plenty of water . Do not consume any caffeinated/decaffeinated beverages or chocolate 12 hours prior to your test. Do not take any antihistamines 12 hours prior to your test. If the patient has contrast allergy: No contrast allergy  On the Day of the Test: Drink plenty of water  until 1 hour prior to the test. Do not eat any food 1 hour prior to test. You may take your regular medications prior to the test.  Take metoprolol (Lopressor) 100 mg two hours prior to test. Patients who wear a continuous glucose monitor MUST remove the device prior to scanning. FEMALES- please wear underwire-free bra if available, avoid dresses & tight clothing      After the Test: Drink plenty of water . After receiving IV contrast, you may experience a mild flushed feeling. This is normal. On occasion, you may experience a mild rash up to 24 hours after the test. This is not dangerous. If this occurs, you can take Benadryl  25 mg, Zyrtec, Claritin, or Allegra and increase your fluid intake. (Patients taking Tikosyn should avoid Benadryl , and may take Zyrtec, Claritin, or Allegra) If you experience trouble breathing, this can be serious. If it is severe call 911 IMMEDIATELY. If it is mild, please call our office.  We will call to schedule your test 2-4 weeks out understanding that some insurance companies will need an authorization prior to the service being performed.   For more information and frequently asked questions, please  visit our website : [email or web address]  For non-scheduling related questions, please contact the cardiac imaging nurse navigator should you have any questions/concerns: Cardiac Imaging Nurse Navigators Direct Office Dial: [phone]   For scheduling needs, including cancellations and rescheduling,  please call [place], [phone].

## 2023-08-24 NOTE — Telephone Encounter (Signed)
 Prescription refill request for Eliquis  received. Indication: DVT Last office visit: 08/24/23 (Hochrein)  Scr: 1.20 (01/04/23)  Age: 78 Weight: 71.2kg  Appropriate dose. Refill sent.

## 2023-09-05 ENCOUNTER — Other Ambulatory Visit (HOSPITAL_COMMUNITY): Payer: Self-pay

## 2023-09-14 ENCOUNTER — Encounter (HOSPITAL_COMMUNITY): Payer: Self-pay

## 2023-09-17 DIAGNOSIS — I200 Unstable angina: Secondary | ICD-10-CM | POA: Diagnosis not present

## 2023-09-17 DIAGNOSIS — I10 Essential (primary) hypertension: Secondary | ICD-10-CM | POA: Diagnosis not present

## 2023-09-18 ENCOUNTER — Ambulatory Visit: Payer: Self-pay | Admitting: *Deleted

## 2023-09-18 LAB — BASIC METABOLIC PANEL WITH GFR
BUN/Creatinine Ratio: 24 (ref 12–28)
BUN: 24 mg/dL (ref 8–27)
CO2: 21 mmol/L (ref 20–29)
Calcium: 9.8 mg/dL (ref 8.7–10.3)
Chloride: 108 mmol/L — ABNORMAL HIGH (ref 96–106)
Creatinine, Ser: 1.02 mg/dL — ABNORMAL HIGH (ref 0.57–1.00)
Glucose: 199 mg/dL — ABNORMAL HIGH (ref 70–99)
Potassium: 4.3 mmol/L (ref 3.5–5.2)
Sodium: 148 mmol/L — ABNORMAL HIGH (ref 134–144)
eGFR: 56 mL/min/{1.73_m2} — ABNORMAL LOW (ref >59)

## 2023-09-21 ENCOUNTER — Ambulatory Visit (HOSPITAL_COMMUNITY)
Admission: RE | Admit: 2023-09-21 | Discharge: 2023-09-21 | Disposition: A | Discharge status: Home / Self Care | Source: Ambulatory Visit | Attending: Cardiology | Admitting: Cardiology

## 2023-09-21 DIAGNOSIS — I25110 Atherosclerotic heart disease of native coronary artery with unstable angina pectoris: Secondary | ICD-10-CM | POA: Diagnosis not present

## 2023-09-21 DIAGNOSIS — R931 Abnormal findings on diagnostic imaging of heart and coronary circulation: Secondary | ICD-10-CM | POA: Diagnosis present

## 2023-09-21 DIAGNOSIS — J849 Interstitial pulmonary disease, unspecified: Secondary | ICD-10-CM | POA: Insufficient documentation

## 2023-09-21 DIAGNOSIS — K449 Diaphragmatic hernia without obstruction or gangrene: Secondary | ICD-10-CM | POA: Insufficient documentation

## 2023-09-21 DIAGNOSIS — I200 Unstable angina: Secondary | ICD-10-CM | POA: Diagnosis present

## 2023-09-21 DIAGNOSIS — I2582 Chronic total occlusion of coronary artery: Secondary | ICD-10-CM | POA: Insufficient documentation

## 2023-09-21 MED ORDER — METOPROLOL TARTRATE 5 MG/5ML IV SOLN
10.0000 mg | Freq: Once | INTRAVENOUS | Status: DC | PRN
Start: 2023-09-21 — End: 2023-09-22

## 2023-09-21 MED ORDER — DILTIAZEM HCL 25 MG/5ML IV SOLN: 10.0000 mg | INTRAVENOUS | Status: DC | PRN

## 2023-09-21 MED ORDER — NITROGLYCERIN 0.4 MG SL SUBL: 0.8000 mg | SUBLINGUAL_TABLET | Freq: Once | SUBLINGUAL | Status: AC

## 2023-09-21 MED ORDER — IOHEXOL 350 MG/ML SOLN: 100.0000 mL | Freq: Once | INTRAVENOUS | Status: AC | PRN

## 2023-09-24 ENCOUNTER — Other Ambulatory Visit: Payer: Self-pay | Admitting: Cardiology

## 2023-09-24 ENCOUNTER — Ambulatory Visit (HOSPITAL_COMMUNITY)
Admission: RE | Admit: 2023-09-24 | Discharge: 2023-09-24 | Disposition: A | Discharge status: Home / Self Care | Source: Ambulatory Visit | Attending: Cardiology | Admitting: Cardiology

## 2023-09-24 DIAGNOSIS — R931 Abnormal findings on diagnostic imaging of heart and coronary circulation: Secondary | ICD-10-CM | POA: Diagnosis not present

## 2023-09-24 DIAGNOSIS — J849 Interstitial pulmonary disease, unspecified: Secondary | ICD-10-CM | POA: Diagnosis not present

## 2023-09-24 DIAGNOSIS — I2582 Chronic total occlusion of coronary artery: Secondary | ICD-10-CM | POA: Diagnosis not present

## 2023-09-24 DIAGNOSIS — I25110 Atherosclerotic heart disease of native coronary artery with unstable angina pectoris: Secondary | ICD-10-CM | POA: Diagnosis not present

## 2023-09-24 DIAGNOSIS — K449 Diaphragmatic hernia without obstruction or gangrene: Secondary | ICD-10-CM | POA: Diagnosis not present

## 2023-09-27 ENCOUNTER — Telehealth: Payer: Self-pay | Admitting: Cardiology

## 2023-09-27 NOTE — Telephone Encounter (Signed)
Patient called to follow-up on test results. 

## 2023-09-27 NOTE — Telephone Encounter (Signed)
 Spoke with pt. Advised pt the CT has not been reviewed and we would give her a call once we got those results.

## 2023-09-28 ENCOUNTER — Telehealth: Payer: Self-pay | Admitting: Cardiology

## 2023-09-28 DIAGNOSIS — Z01812 Encounter for preprocedural laboratory examination: Secondary | ICD-10-CM

## 2023-09-28 NOTE — Telephone Encounter (Signed)
 Pt states she is returning call to a nurse

## 2023-10-01 DIAGNOSIS — Z01812 Encounter for preprocedural laboratory examination: Secondary | ICD-10-CM | POA: Diagnosis not present

## 2023-10-01 DIAGNOSIS — Z01810 Encounter for preprocedural cardiovascular examination: Secondary | ICD-10-CM | POA: Diagnosis not present

## 2023-10-01 LAB — CBC
Hematocrit: 43.2 % (ref 34.0–46.6)
Hemoglobin: 14 g/dL (ref 11.1–15.9)
MCH: 29.7 pg (ref 26.6–33.0)
MCHC: 32.4 g/dL (ref 31.5–35.7)
MCV: 92 fL (ref 79–97)
Platelets: 261 10*3/uL (ref 150–450)
RBC: 4.72 x10E6/uL (ref 3.77–5.28)
RDW: 13 % (ref 11.7–15.4)
WBC: 8.6 10*3/uL (ref 3.4–10.8)

## 2023-10-01 NOTE — Telephone Encounter (Signed)
 Spoke with pt regarding cath per Dr. Lavona. Pt is scheduled for 6/26 for left heart cath. Instructions sent via MyChart. CBC ordered and released. Pt aware. Pt verbalized understanding. All questions if any were answered.

## 2023-10-04 ENCOUNTER — Encounter (HOSPITAL_COMMUNITY): Payer: Self-pay | Admitting: Cardiology

## 2023-10-04 ENCOUNTER — Encounter (HOSPITAL_COMMUNITY)
Admission: RE | Disposition: A | Discharge status: Home / Self Care | Payer: Self-pay | Source: Home / Self Care | Attending: Cardiology

## 2023-10-04 ENCOUNTER — Ambulatory Visit (HOSPITAL_COMMUNITY)
Admission: RE | Admit: 2023-10-04 | Discharge: 2023-10-05 | Disposition: A | Discharge status: Home / Self Care | Attending: Cardiology | Admitting: Cardiology

## 2023-10-04 ENCOUNTER — Other Ambulatory Visit: Payer: Self-pay

## 2023-10-04 ENCOUNTER — Ambulatory Visit: Payer: Self-pay | Admitting: *Deleted

## 2023-10-04 DIAGNOSIS — Z86718 Personal history of other venous thrombosis and embolism: Secondary | ICD-10-CM | POA: Diagnosis not present

## 2023-10-04 DIAGNOSIS — I5042 Chronic combined systolic (congestive) and diastolic (congestive) heart failure: Secondary | ICD-10-CM | POA: Insufficient documentation

## 2023-10-04 DIAGNOSIS — Z7901 Long term (current) use of anticoagulants: Secondary | ICD-10-CM | POA: Insufficient documentation

## 2023-10-04 DIAGNOSIS — E785 Hyperlipidemia, unspecified: Secondary | ICD-10-CM | POA: Insufficient documentation

## 2023-10-04 DIAGNOSIS — I110 Hypertensive heart disease with heart failure: Secondary | ICD-10-CM | POA: Diagnosis not present

## 2023-10-04 DIAGNOSIS — I209 Angina pectoris, unspecified: Secondary | ICD-10-CM | POA: Diagnosis present

## 2023-10-04 DIAGNOSIS — I25119 Atherosclerotic heart disease of native coronary artery with unspecified angina pectoris: Secondary | ICD-10-CM | POA: Diagnosis not present

## 2023-10-04 DIAGNOSIS — I428 Other cardiomyopathies: Secondary | ICD-10-CM | POA: Diagnosis not present

## 2023-10-04 DIAGNOSIS — Z7902 Long term (current) use of antithrombotics/antiplatelets: Secondary | ICD-10-CM | POA: Insufficient documentation

## 2023-10-04 DIAGNOSIS — Z79899 Other long term (current) drug therapy: Secondary | ICD-10-CM | POA: Insufficient documentation

## 2023-10-04 DIAGNOSIS — E1169 Type 2 diabetes mellitus with other specified complication: Secondary | ICD-10-CM | POA: Diagnosis present

## 2023-10-04 DIAGNOSIS — Z955 Presence of coronary angioplasty implant and graft: Secondary | ICD-10-CM

## 2023-10-04 DIAGNOSIS — E119 Type 2 diabetes mellitus without complications: Secondary | ICD-10-CM | POA: Diagnosis present

## 2023-10-04 DIAGNOSIS — Z7982 Long term (current) use of aspirin: Secondary | ICD-10-CM | POA: Diagnosis not present

## 2023-10-04 DIAGNOSIS — I824Y9 Acute embolism and thrombosis of unspecified deep veins of unspecified proximal lower extremity: Secondary | ICD-10-CM | POA: Diagnosis present

## 2023-10-04 DIAGNOSIS — I10 Essential (primary) hypertension: Secondary | ICD-10-CM | POA: Diagnosis present

## 2023-10-04 DIAGNOSIS — I5032 Chronic diastolic (congestive) heart failure: Secondary | ICD-10-CM | POA: Insufficient documentation

## 2023-10-04 DIAGNOSIS — Z8673 Personal history of transient ischemic attack (TIA), and cerebral infarction without residual deficits: Secondary | ICD-10-CM | POA: Diagnosis not present

## 2023-10-04 HISTORY — PX: CORONARY STENT INTERVENTION: CATH118234

## 2023-10-04 HISTORY — PX: LEFT HEART CATH AND CORONARY ANGIOGRAPHY: CATH118249

## 2023-10-04 LAB — GLUCOSE, CAPILLARY
Glucose-Capillary: 148 mg/dL — ABNORMAL HIGH (ref 70–99)
Glucose-Capillary: 302 mg/dL — ABNORMAL HIGH (ref 70–99)

## 2023-10-04 LAB — POCT ACTIVATED CLOTTING TIME: Activated Clotting Time: 539 s

## 2023-10-04 MED ORDER — ASPIRIN 81 MG PO CHEW
81.0000 mg | CHEWABLE_TABLET | ORAL | Status: DC
Start: 2023-10-04 — End: 2023-10-04

## 2023-10-04 MED ORDER — HEPARIN (PORCINE) IN NACL 1000-0.9 UT/500ML-% IV SOLN
INTRAVENOUS | Status: DC | PRN
  Administered 2023-10-04 (×2): 500 mL

## 2023-10-04 MED ORDER — ASPIRIN 81 MG PO CHEW
81.0000 mg | CHEWABLE_TABLET | Freq: Every day | ORAL | Status: DC
  Administered 2023-10-05: 81 mg via ORAL
  Filled 2023-10-04: qty 1

## 2023-10-04 MED ORDER — SODIUM CHLORIDE 0.9% FLUSH
3.0000 mL | Freq: Two times a day (BID) | INTRAVENOUS | Status: DC
  Administered 2023-10-04 (×2): 3 mL via INTRAVENOUS

## 2023-10-04 MED ORDER — HEPARIN SODIUM (PORCINE) 1000 UNIT/ML IJ SOLN
INTRAMUSCULAR | Status: DC | PRN
  Administered 2023-10-04 (×2): 3500 [IU] via INTRAVENOUS

## 2023-10-04 MED ORDER — SODIUM CHLORIDE 0.9 % IV SOLN
INTRAVENOUS | Status: AC
Start: 2023-10-04 — End: 2023-10-04

## 2023-10-04 MED ORDER — SODIUM CHLORIDE 0.9 % IV SOLN
250.0000 mL | INTRAVENOUS | Status: AC | PRN
  Administered 2023-10-04: 250 mL via INTRAVENOUS

## 2023-10-04 MED ORDER — ASPIRIN 81 MG PO CHEW: 81.0000 mg | CHEWABLE_TABLET | Freq: Every day | ORAL | Status: DC

## 2023-10-04 MED ORDER — SACUBITRIL-VALSARTAN 97-103 MG PO TABS
1.0000 | ORAL_TABLET | Freq: Two times a day (BID) | ORAL | Status: DC
  Administered 2023-10-04 – 2023-10-05 (×2): 1 via ORAL
  Filled 2023-10-04 (×2): qty 1

## 2023-10-04 MED ORDER — SODIUM CHLORIDE 0.9 % WEIGHT BASED INFUSION: 3.0000 mL/kg/h | INTRAVENOUS | Status: DC

## 2023-10-04 MED ORDER — LIDOCAINE HCL (PF) 1 % IJ SOLN
INTRAMUSCULAR | Status: AC
  Filled 2023-10-04: qty 30

## 2023-10-04 MED ORDER — VERAPAMIL HCL 2.5 MG/ML IV SOLN
INTRAVENOUS | Status: AC
  Filled 2023-10-04: qty 2

## 2023-10-04 MED ORDER — FAMOTIDINE IN NACL 20-0.9 MG/50ML-% IV SOLN
INTRAVENOUS | Status: AC | PRN
  Administered 2023-10-04: 20 mg via INTRAVENOUS

## 2023-10-04 MED ORDER — CLOPIDOGREL BISULFATE 300 MG PO TABS
ORAL_TABLET | ORAL | Status: DC | PRN
Start: 2023-10-04 — End: 2023-10-04
  Administered 2023-10-04: 600 mg via ORAL

## 2023-10-04 MED ORDER — BISOPROLOL FUMARATE 10 MG PO TABS
10.0000 mg | ORAL_TABLET | ORAL | Status: DC
  Filled 2023-10-04: qty 1

## 2023-10-04 MED ORDER — LABETALOL HCL 5 MG/ML IV SOLN: 10.0000 mg | INTRAVENOUS | Status: AC | PRN

## 2023-10-04 MED ORDER — HEPARIN SODIUM (PORCINE) 1000 UNIT/ML IJ SOLN
INTRAMUSCULAR | Status: AC
  Filled 2023-10-04: qty 10

## 2023-10-04 MED ORDER — CLOPIDOGREL BISULFATE 300 MG PO TABS
ORAL_TABLET | ORAL | Status: AC
  Filled 2023-10-04: qty 1

## 2023-10-04 MED ORDER — IOHEXOL 350 MG/ML SOLN
INTRAVENOUS | Status: DC | PRN
  Administered 2023-10-04: 235 mL

## 2023-10-04 MED ORDER — LABETALOL HCL 5 MG/ML IV SOLN
INTRAVENOUS | Status: AC
  Filled 2023-10-04: qty 4

## 2023-10-04 MED ORDER — CLOPIDOGREL BISULFATE 75 MG PO TABS
75.0000 mg | ORAL_TABLET | Freq: Every day | ORAL | Status: DC
  Administered 2023-10-05: 75 mg via ORAL
  Filled 2023-10-04: qty 1

## 2023-10-04 MED ORDER — BISOPROLOL FUMARATE 5 MG PO TABS
10.0000 mg | ORAL_TABLET | Freq: Every day | ORAL | Status: DC
  Administered 2023-10-05: 10 mg via ORAL
  Filled 2023-10-04: qty 2

## 2023-10-04 MED ORDER — FAMOTIDINE IN NACL 20-0.9 MG/50ML-% IV SOLN
INTRAVENOUS | Status: AC
  Filled 2023-10-04: qty 50

## 2023-10-04 MED ORDER — SODIUM CHLORIDE 0.9% FLUSH: 3.0000 mL | INTRAVENOUS | Status: DC | PRN

## 2023-10-04 MED ORDER — HYDRALAZINE HCL 20 MG/ML IJ SOLN
INTRAMUSCULAR | Status: DC | PRN
  Administered 2023-10-04 (×2): 10 mg via INTRAVENOUS

## 2023-10-04 MED ORDER — HYDRALAZINE HCL 20 MG/ML IJ SOLN
INTRAMUSCULAR | Status: AC
  Filled 2023-10-04: qty 1

## 2023-10-04 MED ORDER — CLOPIDOGREL BISULFATE 300 MG PO TABS
ORAL_TABLET | ORAL | Status: AC
  Filled 2023-10-04: qty 2

## 2023-10-04 MED ORDER — FENTANYL CITRATE (PF) 100 MCG/2ML IJ SOLN
INTRAMUSCULAR | Status: AC
  Filled 2023-10-04: qty 2

## 2023-10-04 MED ORDER — NITROGLYCERIN 1 MG/10 ML FOR IR/CATH LAB
INTRA_ARTERIAL | Status: AC
  Filled 2023-10-04: qty 10

## 2023-10-04 MED ORDER — FENTANYL CITRATE (PF) 100 MCG/2ML IJ SOLN
INTRAMUSCULAR | Status: DC | PRN
  Administered 2023-10-04: 25 ug via INTRAVENOUS

## 2023-10-04 MED ORDER — NITROGLYCERIN 1 MG/10 ML FOR IR/CATH LAB
INTRA_ARTERIAL | Status: DC | PRN
Start: 2023-10-04 — End: 2023-10-04
  Administered 2023-10-04: 200 ug via INTRACORONARY

## 2023-10-04 MED ORDER — SODIUM CHLORIDE 0.9 % WEIGHT BASED INFUSION: 1.0000 mL/kg/h | INTRAVENOUS | Status: DC

## 2023-10-04 MED ORDER — LABETALOL HCL 5 MG/ML IV SOLN
INTRAVENOUS | Status: DC | PRN
  Administered 2023-10-04: 10 mg via INTRAVENOUS

## 2023-10-04 MED ORDER — AMLODIPINE BESYLATE 5 MG PO TABS
10.0000 mg | ORAL_TABLET | Freq: Once | ORAL | Status: AC
  Administered 2023-10-04: 10 mg via ORAL
  Filled 2023-10-04: qty 2

## 2023-10-04 MED ORDER — AMLODIPINE BESYLATE 10 MG PO TABS
10.0000 mg | ORAL_TABLET | Freq: Every day | ORAL | Status: DC
  Administered 2023-10-05: 10 mg via ORAL
  Filled 2023-10-04: qty 1

## 2023-10-04 MED ORDER — LIDOCAINE HCL (PF) 1 % IJ SOLN
INTRAMUSCULAR | Status: DC | PRN
  Administered 2023-10-04: 2 mL

## 2023-10-04 MED ORDER — ACETAMINOPHEN 325 MG PO TABS: 650.0000 mg | ORAL_TABLET | ORAL | Status: DC | PRN

## 2023-10-04 MED ORDER — MIDAZOLAM HCL 2 MG/2ML IJ SOLN
INTRAMUSCULAR | Status: DC | PRN
  Administered 2023-10-04: 1 mg via INTRAVENOUS

## 2023-10-04 MED ORDER — VERAPAMIL HCL 2.5 MG/ML IV SOLN
INTRAVENOUS | Status: DC | PRN
  Administered 2023-10-04: 10 mL via INTRA_ARTERIAL

## 2023-10-04 MED ORDER — HYDRALAZINE HCL 20 MG/ML IJ SOLN: 10.0000 mg | INTRAMUSCULAR | Status: AC | PRN

## 2023-10-04 MED ORDER — MIDAZOLAM HCL 2 MG/2ML IJ SOLN
INTRAMUSCULAR | Status: AC
  Filled 2023-10-04: qty 2

## 2023-10-04 NOTE — H&P (Signed)
 Cardiology Admission History and Physical   Patient ID: [name] MRN: [id number]; DOB: February 15, 1946   Admission date: 10/04/2023  PCP:  Lavell Bari LABOR, FNP   [facility] Providers Cardiologist:  Lynwood Schilling, MD       Chief Complaint:  chest pain  Patient Profile: [name] is a 78 y.o. female with history of HTN, CHF, DM, CVA who is being seen 10/04/2023 for the evaluation of chest pain.  History of Present Illness: [name] with above history. Had right popliteal  DVT last August and is on Eliquis . She was seen recently by Dr Schilling and noted symptoms of chest pain and pain into the jaw. Happening a couple of times a week. Coronary CTA done showing obstructive disease in LAD. Already on optimal medical therapy. Cardiac cath recommended.    Past Medical History:  Diagnosis Date   Asthma    Chronic systolic heart failure (HCC)    Echo (08/26/13):  Mild LVH. EF 25% to 30%. Diffuse HK. Aortic valve: There was trivial regurgitation. Mitral valve: There was mild regurgitation. Left atrium: The atrium was mildly dilated.   GERD (gastroesophageal reflux disease)    Headache(784.0)    History of kidney stones    History of nephrolithiasis    Hypertension    NICM (nonischemic cardiomyopathy) (HCC)    a. LHC (08/2013):  no CAD   Sleep apnea    does not use machine   Stroke (HCC)    Type II or unspecified type diabetes mellitus without mention of complication, uncontrolled    borderline   Past Surgical History:  Procedure Laterality Date   ABDOMINAL HYSTERECTOMY     CATARACT EXTRACTION     COLONOSCOPY WITH PROPOFOL  N/A 07/01/2019   Procedure: COLONOSCOPY WITH PROPOFOL ;  Surgeon: Harvey Margo CROME, MD;  Location: [facility];  Service: Endoscopy;  Laterality: N/A;  2:15pm - pt will not move up   CYSTOSCOPY WITH RETROGRADE PYELOGRAM, URETEROSCOPY AND STENT PLACEMENT Left 08/11/2021   Procedure: CYSTOSCOPY WITH RETROGRADE PYELOGRAM, URETEROSCOPY AND  STENT PLACEMENT;  Surgeon: Sherrilee Belvie CROME, MD;  Location: [facility] ORS;  Service: Urology;  Laterality: Left;   CYSTOSCOPY WITH RETROGRADE PYELOGRAM, URETEROSCOPY AND STENT PLACEMENT Left 09/08/2021   Procedure: CYSTOSCOPY WITH RETROGRADE PYELOGRAM, URETEROSCOPY AND STENT EXCHANGE;  Surgeon: Sherrilee Belvie CROME, MD;  Location: [facility] OR[facility];  Service: Urology;  Laterality: Left;   CYSTOSCOPY WITH RETROGRADE PYELOGRAM, URETEROSCOPY AND STENT PLACEMENT Right 10/06/2021   Procedure: CYSTOSCOPY WITH RETROGRADE PYELOGRAM, URETEROSCOPY AND STENT PLACEMENT;  Surgeon: Sherrilee Belvie CROME, MD;  Location: [facility] OR[facility];  Service: Urology;  Laterality: Right;   CYSTOSCOPY WITH RETROGRADE PYELOGRAM, URETEROSCOPY AND STENT PLACEMENT Right 10/27/2021   Procedure: CYSTOSCOPY WITH RETROGRADE PYELOGRAM, URETEROSCOPY AND STENT EXCHANGE;  Surgeon: Sherrilee Belvie CROME, MD;  Location: [facility] OR[facility];  Service: Urology;  Laterality: Right;   EXTRACORPOREAL SHOCK WAVE LITHOTRIPSY Left 05/31/2021   Procedure: EXTRACORPOREAL SHOCK WAVE LITHOTRIPSY (ESWL);  Surgeon: Sherrilee Belvie CROME, MD;  Location: [facility] OR[facility];  Service: Urology;  Laterality: Left;   EXTRACORPOREAL SHOCK WAVE LITHOTRIPSY Left 06/28/2021   Procedure: EXTRACORPOREAL SHOCK WAVE LITHOTRIPSY (ESWL);  Surgeon: Sherrilee Belvie CROME, MD;  Location: [facility];  Service: Urology;  Laterality: Left;   HOLMIUM LASER APPLICATION Left 08/11/2021   Procedure: HOLMIUM LASER APPLICATION;  Surgeon: Sherrilee Belvie CROME, MD;  Location: [facility];  Service: Urology;  Laterality: Left;   HOLMIUM LASER APPLICATION Left 09/08/2021   Procedure: HOLMIUM LASER APPLICATION;  Surgeon: Sherrilee Belvie CROME, MD;  Location: [facility];  Service: Urology;  Laterality: Left;   HOLMIUM LASER APPLICATION Right 10/06/2021   Procedure: HOLMIUM LASER APPLICATION;  Surgeon: Sherrilee Belvie CROME, MD;  Location: [facility];  Service: Urology;  Laterality: Right;   HOLMIUM LASER APPLICATION Right 10/27/2021   Procedure: HOLMIUM LASER  APPLICATION;  Surgeon: Sherrilee Belvie CROME, MD;  Location: [facility];  Service: Urology;  Laterality: Right;   LEFT AND RIGHT HEART CATHETERIZATION WITH CORONARY ANGIOGRAM N/A 09/03/2013   Procedure: LEFT AND RIGHT HEART CATHETERIZATION WITH CORONARY ANGIOGRAM;  Surgeon: Lonni JONETTA Cash, MD;  Location: [facility] CATH LAB;  Service: Cardiovascular;  Laterality: N/A;   OOPHORECTOMY     removed 1 ovary and 1 ovary remains.   POLYPECTOMY  07/01/2019   Procedure: POLYPECTOMY;  Surgeon: Harvey Margo CROME, MD;  [facility];  Service: Endoscopy;;   STONE EXTRACTION WITH BASKET Left 09/08/2021   Procedure: STONE EXTRACTION WITH BASKET;  Surgeon: Sherrilee Belvie CROME, MD;  Location: [facility];  Service: Urology;  Laterality: Left;   STONE EXTRACTION WITH BASKET Right 10/27/2021   Procedure: STONE EXTRACTION WITH BASKET;  Surgeon: Sherrilee Belvie CROME, MD;  Location: [facility];  Service: Urology;  Laterality: Right;   ureteral extraction of kidney stone       Medications Prior to Admission: Prior to Admission medications   Medication Sig Start Date End Date Taking? Authorizing Provider  albuterol  (VENTOLIN  HFA) 108 (90 Base) MCG/ACT inhaler Inhale 2 puffs into the lungs every 6 (six) hours as needed for wheezing or shortness of breath. 11/16/22  Yes Hawks, Christy A, FNP  amLODipine  (NORVASC ) 10 MG tablet Take 1 tablet (10 mg total) by mouth daily. 05/09/21  Yes Hawks, Christy A, FNP  apixaban  (ELIQUIS ) 5 MG TABS tablet Take 1 tablet (5 mg total) by mouth 2 (two) times daily. 08/24/23  Yes Lavona Agent, MD  bisoprolol  (ZEBETA ) 10 MG tablet Take 1 tablet (10 mg total) by mouth daily. 11/16/22  Yes Hawks, Bari LABOR, FNP  sacubitril -valsartan  (ENTRESTO ) 97-103 MG Take 1 tablet by mouth twice daily 04/27/22  Yes Hochrein, Agent, MD  acetaminophen  (TYLENOL ) 500 MG tablet Take 1,000 mg by mouth every 6 (six) hours as needed for moderate pain or headache.    [provider]  albuterol  (PROVENTIL ) (2.5  MG/3ML) 0.083% nebulizer solution Take 3 mLs (2.5 mg total) by nebulization every 6 (six) hours as needed for wheezing or shortness of breath. 06/18/23   Lavell Bari LABOR, FNP  glucose blood (ONETOUCH VERIO) test strip Use as instructed 12/24/13   Lavell Bari LABOR, FNP  [facility] DELICA LANCETS FINE MISC Use to check BG once daily 01/19/14   Eckard, Tammy, RPH-CPP  escitalopram  (LEXAPRO ) 20 MG tablet Take 1 tablet (20 mg total) by mouth daily. 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 06/19/21-Currently lives with daughter and is actively looking for a rental home.    1 son and 1 daughter. Son died in 07-Nov-2015.   2 grandchildren   Left handedNo caffiene   Social Drivers of Health   Financial Resource Strain: Low Risk  (09/19/2022)   Overall Financial Resource Strain (CARDIA)    Difficulty of Paying Living Expenses: Not hard at all  Food Insecurity: No Food Insecurity (09/19/2022)   Hunger Vital Sign    Worried About Running Out of Food in the Last Year: Never true    Ran Out of Food in the Last Year: Never true  Transportation Needs: No Transportation Needs (09/19/2022)    PRAPARE - Administrator, Civil Service (Medical): No    Lack of Transportation (Non-Medical): No  Physical Activity: Insufficiently Active (09/19/2022)   Exercise Vital Sign    Days of Exercise per Week: 3 days    Minutes of Exercise per Session: 30 min  Stress: No Stress Concern Present (09/19/2022)   [employer] of Occupational Health - Occupational Stress Questionnaire    Feeling of Stress : Not at all  Social Connections: Moderately Integrated (09/19/2022)   Social Connection and Isolation Panel    Frequency of Communication with Friends and Family: More than three times a week    Frequency of Social Gatherings with Friends and Family: More than three times a week    Attends Religious Services: More than 4 times per year    Active Member of [employer] or Organizations: Yes    Attends Banker Meetings: More than 4 times per year    Marital Status: Divorced  Intimate Partner Violence: Not At Risk (09/19/2022)   Humiliation, Afraid, Rape, and Kick questionnaire    Fear of Current or Ex-Partner: No    Emotionally Abused: No    Physically Abused: No    Sexually Abused: No     Family History:   The patient's family history includes Arthritis in her mother; CAD (age of onset: 73) in her mother; COPD in her father; Cancer in her sister; Crohn's disease in her sister; Diabetes in her maternal grandmother; Early death (age of onset: 40) in her brother; Hip fracture (age of onset: 78) in her mother; Hypertension in her brother, mother, and sister. There is no history of Colon cancer.    ROS:  Please see the history of present illness.  All other ROS reviewed and negative.     Physical Exam/Data: Vitals:   10/04/23 0606  BP: (!) 201/102  Pulse: (!) 58  Temp: 97.9 F (36.6 C)  TempSrc: Oral  SpO2: 96%  Weight: 68 kg  Height: 5' 4 (1.626 m)   No intake or output data in the 24 hours ending 10/04/23 0706    10/04/2023    6:06 AM 08/24/2023   10:47 AM  06/28/2023   12:25 PM  Last 3 Weights  Weight (lbs) 150 lb 157 lb 157 lb  Weight (kg) 68.04 kg 71.215 kg 71.215 kg     Body mass index is 25.75 kg/m.  General:  Well nourished, well developed, in no acute distress HEENT: normal Neck: no  JVD Vascular: No carotid bruits; Distal pulses 2+ bilaterally   Cardiac:  normal S1, S2; RRR; no murmur   Lungs:  clear to auscultation bilaterally, no wheezing, rhonchi or rales  Abd: soft, nontender, no hepatomegaly  Ext: no  edema Musculoskeletal:  No deformities, BUE and BLE strength normal and equal Skin: warm and dry  Neuro:  CNs 2-12 intact, no focal abnormalities noted Psych:  Normal affect   EKG:  The ECG that was done today was personally reviewed and demonstrates NSR, old anterior infarct.   Relevant CV Studies: Coronary RUJ:[id number]: 1. Coronary calcium  score of 231. This was 69th percentile for age-, sex, and race-matched controls.   2. Total plaque volume 365 mm3 which is 51st percentile for age- and sex-matched controls (calcified plaque 53mm3; non-calcified plaque 316mm3). TPV is severe.   3. Normal coronary origin with right dominance.   4. Moderate atherosclerosis: 50-69% proximal to mid LAD/diastal RCA and ostial OM1.   5. Consider symptomt guided anti-ischemic and preventive pharmacotherapy as well as risk factor modification per guideline-directed care.   6. This study has been submitted for FFR analysis. IMPRESSION: 1. Coronary CTA FFR flow analysis demonstrates a possible flow limiting lesion in the mid LAD (0.85>>0.67).   2. Recommend cardiac catheterization.  Laboratory Data: High Sensitivity Troponin:  No results for input(s): TROPONINIHS in the last 720 hours.    ChemistryNo results for input(s): NA, K, CL, CO2, GLUCOSE, BUN, CREATININE, CALCIUM , MG, GFRNONAA, GFRAA, ANIONGAP in the last 168 hours.  No results for input(s): PROT, ALBUMIN, AST, ALT, ALKPHOS, BILITOT  in the last 168 hours. Lipids No results for input(s): CHOL, TRIG, HDL, LABVLDL, LDLCALC, CHOLHDL in the last 168 hours. Hematology Recent Labs  Lab 10/01/23 1422  WBC 8.6  RBC 4.72  HGB 14.0  HCT 43.2  MCV 92  MCH 29.7  MCHC 32.4  RDW 13.0  PLT 261   Thyroid  No results for input(s): TSH, FREET4 in the last 168 hours. BNPNo results for input(s): BNP, PROBNP in the last 168 hours.  DDimer No results for input(s): DDIMER in the last 168 hours.  Radiology/Studies:  No results found.   Assessment and Plan: Angina pectoris. Intermediate risk based on coronary CTA. On optimal medical therapy. Proceed with cardiac cath with possible PCI. The procedure and risks were reviewed including but not limited to death, myocardial infarction, stroke, arrythmias, bleeding, transfusion, emergency surgery, dye allergy, or renal dysfunction. The patient voices understanding and is agreeable to proceed. HTN poorly controlled History of nonischemic CM. Last Echo in 2020 with normal EF History of unprovoked DVT. Plan to continue long term anticoagulation. If stent required will need antiplatelet therapy as well.  HLD. On statin  DM   Risk Assessment/Risk Scores:        Code Status: Full Code For questions or updates, please contact [facility] Please consult [email or web address] for contact info under     Signed, Demonie Kassa [place], MD  10/04/2023 7:06 AM

## 2023-10-04 NOTE — Progress Notes (Signed)
 Small oozing noted to radial site. Dressing removed and replaced with small gauze and tegaderm dressing. Post activity re-enforced. Will continue to monitor per order/protocol.Aishi Courts E

## 2023-10-04 NOTE — Interval H&P Note (Signed)
 History and Physical Interval Note:  10/04/2023 7:13 AM  [name]  has presented today for surgery, with the diagnosis of chest pain.  The various methods of treatment have been discussed with the patient and family. After consideration of risks, benefits and other options for treatment, the patient has consented to  Procedure(s): LEFT HEART CATH AND CORONARY ANGIOGRAPHY (N/A) as a surgical intervention.  The patient's history has been reviewed, patient examined, no change in status, stable for surgery.  I have reviewed the patient's chart and labs.  Questions were answered to the patient's satisfaction.   Cath Lab Visit (complete for each Cath Lab visit)  Clinical Evaluation Leading to the Procedure:   ACS: No.  Non-ACS:    Anginal Classification: CCS II  Anti-ischemic medical therapy: Maximal Therapy (2 or more classes of medications)  Non-Invasive Test Results: Intermediate-risk stress test findings: cardiac mortality 1-3%/year  Prior CABG: No previous CABG        Maude [facility] 10/04/2023 7:14 AM

## 2023-10-04 NOTE — Progress Notes (Signed)
 TR band fully deflated. Band removed, dressing placed. No further s/sx of hematoma. Pt re-educated on restrictions of use of RUE. Pt acknowledged. Awaiting room assignment. Will continue to monitor.

## 2023-10-04 NOTE — Progress Notes (Signed)
 3x3 hematoma noted proximal to TR band. Manual pressure applied x . TR band repositioned with 3 cc air returned to band for a total of 10cc. Reverse barbeau remains a B/C. Arm remains elevated on pillow. Report given to Juliene Hicks, RN; care released.Katheryn Culliton E

## 2023-10-05 ENCOUNTER — Encounter (HOSPITAL_COMMUNITY): Payer: Self-pay | Admitting: Cardiology

## 2023-10-05 ENCOUNTER — Encounter: Payer: Self-pay | Admitting: Cardiology

## 2023-10-05 ENCOUNTER — Other Ambulatory Visit (HOSPITAL_COMMUNITY): Payer: Self-pay

## 2023-10-05 DIAGNOSIS — I25119 Atherosclerotic heart disease of native coronary artery with unspecified angina pectoris: Secondary | ICD-10-CM | POA: Diagnosis not present

## 2023-10-05 DIAGNOSIS — I209 Angina pectoris, unspecified: Secondary | ICD-10-CM

## 2023-10-05 DIAGNOSIS — I5032 Chronic diastolic (congestive) heart failure: Secondary | ICD-10-CM | POA: Insufficient documentation

## 2023-10-05 DIAGNOSIS — Z7902 Long term (current) use of antithrombotics/antiplatelets: Secondary | ICD-10-CM | POA: Diagnosis not present

## 2023-10-05 DIAGNOSIS — I110 Hypertensive heart disease with heart failure: Secondary | ICD-10-CM | POA: Diagnosis not present

## 2023-10-05 DIAGNOSIS — I82409 Acute embolism and thrombosis of unspecified deep veins of unspecified lower extremity: Secondary | ICD-10-CM | POA: Insufficient documentation

## 2023-10-05 DIAGNOSIS — E785 Hyperlipidemia, unspecified: Secondary | ICD-10-CM | POA: Diagnosis not present

## 2023-10-05 DIAGNOSIS — I5042 Chronic combined systolic (congestive) and diastolic (congestive) heart failure: Secondary | ICD-10-CM | POA: Diagnosis not present

## 2023-10-05 DIAGNOSIS — Z7901 Long term (current) use of anticoagulants: Secondary | ICD-10-CM | POA: Diagnosis not present

## 2023-10-05 DIAGNOSIS — E119 Type 2 diabetes mellitus without complications: Secondary | ICD-10-CM | POA: Diagnosis not present

## 2023-10-05 DIAGNOSIS — I428 Other cardiomyopathies: Secondary | ICD-10-CM | POA: Diagnosis not present

## 2023-10-05 DIAGNOSIS — Z7982 Long term (current) use of aspirin: Secondary | ICD-10-CM | POA: Diagnosis not present

## 2023-10-05 LAB — BASIC METABOLIC PANEL WITH GFR
Anion gap: 11 (ref 5–15)
BUN: 20 mg/dL (ref 8–23)
CO2: 22 mmol/L (ref 22–32)
Calcium: 9.4 mg/dL (ref 8.9–10.3)
Chloride: 109 mmol/L (ref 98–111)
Creatinine, Ser: 1.05 mg/dL — ABNORMAL HIGH (ref 0.44–1.00)
GFR, Estimated: 54 mL/min — ABNORMAL LOW (ref >60)
Glucose, Bld: 138 mg/dL — ABNORMAL HIGH (ref 70–99)
Potassium: 3.3 mmol/L — ABNORMAL LOW (ref 3.5–5.1)
Sodium: 142 mmol/L (ref 135–145)

## 2023-10-05 LAB — CBC
HCT: 42.1 % (ref 36.0–46.0)
Hemoglobin: 13.8 g/dL (ref 12.0–15.0)
MCH: 29.1 pg (ref 26.0–34.0)
MCHC: 32.8 g/dL (ref 30.0–36.0)
MCV: 88.6 fL (ref 80.0–100.0)
Platelets: 243 10*3/uL (ref 150–400)
RBC: 4.75 MIL/uL (ref 3.87–5.11)
RDW: 13.1 % (ref 11.5–15.5)
WBC: 10.4 10*3/uL (ref 4.0–10.5)
nRBC: 0 % (ref 0.0–0.2)

## 2023-10-05 MED ORDER — CLOPIDOGREL BISULFATE 75 MG PO TABS
75.0000 mg | ORAL_TABLET | Freq: Every day | ORAL | 5 refills | Status: AC
  Filled 2023-10-05: qty 30, 30d supply, fill #0
  Filled 2023-12-20: qty 30, 30d supply, fill #1

## 2023-10-05 MED ORDER — NITROGLYCERIN 0.4 MG SL SUBL
0.4000 mg | SUBLINGUAL_TABLET | SUBLINGUAL | 2 refills | Status: AC | PRN
  Filled 2023-10-05: qty 25, 10d supply, fill #0
  Filled 2023-12-20: qty 25, 10d supply, fill #1

## 2023-10-05 MED ORDER — ASPIRIN 81 MG PO CHEW: 81.0000 mg | CHEWABLE_TABLET | Freq: Every day | ORAL | Status: DC

## 2023-10-05 MED ORDER — APIXABAN 5 MG PO TABS: 5.0000 mg | ORAL_TABLET | Freq: Two times a day (BID) | ORAL | Status: DC

## 2023-10-05 MED ORDER — SPIRONOLACTONE 25 MG PO TABS
25.0000 mg | ORAL_TABLET | Freq: Every day | ORAL | 6 refills | Status: AC
  Filled 2023-10-05: qty 30, 30d supply, fill #0
  Filled 2023-12-20: qty 30, 30d supply, fill #1

## 2023-10-05 MED ORDER — ROSUVASTATIN CALCIUM 40 MG PO TABS
40.0000 mg | ORAL_TABLET | Freq: Every day | ORAL | 11 refills | Status: AC
  Filled 2023-10-05: qty 30, 30d supply, fill #0
  Filled 2023-12-20: qty 30, 30d supply, fill #1

## 2023-10-05 MED ORDER — POTASSIUM CHLORIDE CRYS ER 20 MEQ PO TBCR
40.0000 meq | EXTENDED_RELEASE_TABLET | Freq: Once | ORAL | Status: AC
  Administered 2023-10-05: 40 meq via ORAL
  Filled 2023-10-05: qty 2

## 2023-10-05 NOTE — Progress Notes (Signed)
 CARDIAC REHAB PHASE I    Pt sitting in bed, feeling well today. Ambulating independently with no CP, SOB or dizziness.  Post stent education including site care, restrictions, risk factors, exercise guidelines, NTG use, antiplatelet therapy importance, heart healthy diabetic diet and CRP2 reviewed. All questions and concerns addressed. Will refer to [facility] for CRP2. Plan for home later today.    [id number] Felicia Asberry Hacking, RN BSN 10/05/2023 9:16 AM

## 2023-10-05 NOTE — Plan of Care (Signed)
  Problem: Cardiovascular: Goal: Ability to achieve and maintain adequate cardiovascular perfusion will improve Outcome: Adequate for Discharge Goal: Vascular access site(s) Level 0-1 will be maintained Outcome: Adequate for Discharge   Problem: Clinical Measurements: Goal: Ability to maintain clinical measurements within normal limits will improve Outcome: Progressing Goal: Will remain free from infection Outcome: Progressing Goal: Diagnostic test results will improve Outcome: Progressing Goal: Respiratory complications will improve Outcome: Progressing Goal: Cardiovascular complication will be avoided Outcome: Progressing

## 2023-10-05 NOTE — Discharge Summary (Addendum)
 Discharge Summary   Patient ID: [name] MRN: [id number]; DOB: 1945-12-03  Admit date: 10/04/2023 Discharge date: 10/05/2023  PCP:  Lavell Bari LABOR, FNP   [facility] Providers Cardiologist:  Lynwood Schilling, MD    Discharge Diagnoses  Principal Problem:   Angina pectoris [facility]) Active Problems:   Essential hypertension   Type 2 diabetes mellitus with other specified complication (HCC)   Deep vein thrombosis (DVT) of proximal lower extremity (HCC)   Heart failure with recovered ejection fraction (HFrecEF) [facility])   Diagnostic Studies/Procedures  Left heart catheterization 10/04/2023  Mid LAD lesion is 85% stenosed.   1st Diag lesion is 80% stenosed.   1st Mrg lesion is 85% stenosed.   A drug-eluting stent was successfully placed using a STENT SYNERGY XD 3.0X16.   A drug-eluting stent was successfully placed using a STENT SYNERGY XD 3.0X12.   A drug-eluting stent was successfully placed using a STENT SYNERGY XD 2.50X12.   Post intervention, there is a 0% residual stenosis.   Post intervention, there is a 0% residual stenosis.   The left ventricular systolic function is normal.   LV end diastolic pressure is normal.   Recommend to resume Apixaban , at currently prescribed dose and frequency on 10/05/2023.   Recommend concurrent antiplatelet therapy of Aspirin  81 mg for 1 month and Clopidogrel  75mg  daily for 6 months .   Severe 2 vessel obstructive CAD Normal LV function Normal LVEDP 15 mm Hg Successful PCI of the mid LAD complicated by proximal  edge dissection. Successfully treated with DES x 2. Initial compromise of diagonal flow but this improved with IC Ntg and the patient was pain free without Ecg changes Successful PCI of the first OM with DES x 1   Plan: due to complexity of intervention and severe HTN will observe overnight tonight. Anticipate DC in am. Will treat with ASA for one week and Plavix  for 6 months. Anticipate resuming Eliquis  tomorrow.   _____________   History of Present Illness   [name] is a 78 y.o. female with history of nonischemic cardiomyopathy, with recovered 2017, unprovoked DVT 2023, hypertension, hyperlipidemia, diabetes, OSA.  Patient seen outpatient May 2025 and had been complaining of fatigue and jaw pain.  A coronary CTA was ordered that showed disease in the mid LAD with positive FFR, she was scheduled for outpatient cardiac catheterization   Hospital Course    CAD Hyperlipidemia Cardiac catheterization revealing two-vessel obstructive CAD in the mid LAD.  She received DES x 2. Intervention complicated by proximal edge dissection (no EKG changes).  Also received 1 stent to OM1. Plan is for aspirin  x 1 week.  Plavix  for 6 months.  She will also be on Eliquis  for history of unprovoked DVT. Has not been on statin therapy, initiating rosuvastatin  40 mg daily.  Repeat LDL at follow-up visit.  Last LDL was 10 months ago and was 138.  Goal should be less than 55. Will also discharge her on some nitroglycerin   Nonischemic cardiomyopathy with recovered EF EF was 35% 2015.  Heart cath with no CAD.  EF recovered in 2017-2020.  Left heart catheterization showing preserved function.  Euvolemic and compensated. GDMT: Continue with Entresto  97-103 mg twice daily, bisoprolol  10 mg, will also start spironolactone  25 mg given elevated blood pressure.  Hypertension Suspect this is related to untreated sleep apnea.  Would plan for sleep study outpatient.  Also adding on spironolactone  25 mg since she has had some low potassium as well.  If she  continues to demonstrate resistant hypertension would plan to evaluate secondary causes. She will check her blood pressure twice a day for the next 2 weeks and bring to upcoming appointment.  Unprovoked DVT Chronically on Eliquis  5 mg twice daily.  Hypokalemia Potassium 3.3.  Giving 1 dose of 40 mEq prior to discharge.  Murmur Suspect mild aortic stenosis, faint 2/6.  Consider echo outpatient.   Patient seen and examined by myself and Dr. Lavona and deemed stable for discharge.  Radial cath site free of any acute complications.  Medications will be sent to [facility] pharmacy.  Follow-up has been scheduled, I will see her in clinic.  Afterwards she can go back to [place] to follow-up with Dr. Lavona.  Check labs 1 week.  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     Did the patient have an acute coronary syndrome (MI, NSTEMI, STEMI, etc) this admission?:  No                               Did the patient have a percutaneous coronary intervention (stent / angioplasty)?:  Yes.     Cath/PCI Registry Performance & Quality Measures: Aspirin  prescribed? - Yes ADP Receptor Inhibitor (Plavix /Clopidogrel , Brilinta/Ticagrelor or Effient/Prasugrel) prescribed (includes medically managed patients)? - Yes High Intensity Statin (Lipitor 40-80mg  or Crestor  20-40mg ) prescribed? - Yes For EF <40%, was ACEI/ARB prescribed? - Not Applicable (EF >/= 40%) For EF <40%, Aldosterone Antagonist (Spironolactone  or Eplerenone) prescribed? - Not Applicable (EF >/= 40%) Cardiac Rehab Phase II ordered? - Yes     _____________  Discharge Vitals Blood pressure (!) 177/64, pulse (!) 52, temperature 98.2 F (36.8 C), temperature source Oral, resp. rate 16, height 5' 4 (1.626 m), weight 70.9 kg, last menstrual period 01/22/1981, SpO2 92%.  Filed Weights   10/04/23 0606 10/04/23 1431  Weight: 68 kg 70.9 kg    Physical Exam  Cardiovascular:     Rate and Rhythm: Normal rate and regular rhythm.     Heart sounds: Murmur heard.  Pulmonary:     Effort: Pulmonary effort is normal.     Breath sounds: Normal breath sounds.  Abdominal:     General: Abdomen is flat.     Palpations: Abdomen is soft.   Skin:    General: Skin is warm and dry.     Capillary Refill: Capillary refill takes less than 2 seconds.   Neurological:     Mental Status: She is alert.      Labs & Radiologic Studies  CBC Recent Labs    10/05/23 0427  WBC 10.4  HGB 13.8  HCT 42.1  MCV 88.6  PLT 243   Basic Metabolic Panel Recent Labs    93/72/74 0427  NA 142  K 3.3*  CL 109  CO2 22  GLUCOSE 138*  BUN 20  CREATININE 1.05*  CALCIUM  9.4   Liver Function Tests No results for input(s): AST, ALT, ALKPHOS, BILITOT, PROT, ALBUMIN in the last 72 hours. No results for input(s): LIPASE, AMYLASE in the last 72 hours. High Sensitivity Troponin:   No results for input(s): TROPONINIHS in the last 720 hours.  No results for input(s): TRNPT in the last 720 hours.  BNP Invalid input(s): POCBNP No results for input(s): PROBNP in the last 72 hours.  No results for input(s): BNP in the last 72 hours.  D-Dimer No results for input(s): DDIMER in the last 72 hours. Hemoglobin A1C  No results for input(s): HGBA1C in the last 72 hours. Fasting Lipid Panel No results for input(s): CHOL, HDL, LDLCALC, TRIG, CHOLHDL, LDLDIRECT in the last 72 hours. No results found for: LIPOA  Thyroid  Function Tests No results for input(s): TSH, T4TOTAL, T3FREE, THYROIDAB in the last 72 hours.  Invalid input(s): FREET3 _____________  CARDIAC CATHETERIZATION Result Date: 10/04/2023   Mid LAD lesion is 85% stenosed.   1st Diag lesion is 80% stenosed.   1st Mrg lesion is 85% stenosed.   A drug-eluting stent was successfully placed using a STENT SYNERGY XD 3.0X16.   A drug-eluting stent was successfully placed using a STENT SYNERGY XD 3.0X12.   A drug-eluting stent was successfully placed using a STENT SYNERGY XD 2.50X12.   Post intervention, there is a 0% residual stenosis.   Post intervention, there is a 0% residual stenosis.   The left ventricular systolic function is normal.   LV end diastolic pressure is normal.   Recommend to resume Apixaban , at currently prescribed dose and frequency on 10/05/2023.   Recommend concurrent antiplatelet therapy of  Aspirin  81 mg for 1 month and Clopidogrel  75mg  daily for 6 months . Severe 2 vessel obstructive CAD Normal LV function Normal LVEDP 15 mm Hg Successful PCI of the mid LAD complicated by proximal  edge dissection. Successfully treated with DES x 2. Initial compromise of diagonal flow but this improved with IC Ntg and the patient was pain free without Ecg changes Successful PCI of the first OM with DES x 1 Plan: due to complexity of intervention and severe HTN will observe overnight tonight. Anticipate DC in am. Will treat with ASA for one week and Plavix  for 6 months. Anticipate resuming Eliquis  tomorrow.   CT CORONARY MORPH W/CTA COR W/SCORE W/CA W/CM &/OR WO/CM Addendum Date: 09/27/2023 ADDENDUM REPORT: 09/27/2023 07:29 ADDENDUM: OVER-READ INTERPRETATION  CT CHEST The following report is an over-read performed by radiologist Dr. Franky Puffer [facility] on 06/29/2023. This over-read does not include interpretation of cardiac or coronary anatomy or pathology. The interpretation by the cardiologist is attached. COMPARISON:  None. FINDINGS: Within the visualized portions of the lungs demonstrates prominence of the interstitial markings bilaterally correlate with chronic interstitial lung disease some nodular atelectatic changes of the right middle lobe without evidence of suspicious pulmonary nodules. No acute infiltrates consolidations. No pulmonary nodules or masses. No pleural effusions. No mediastinal masses or adenopathy. No pericardial effusions. Aorta appears normal caliber. No acute findings in the upper abdomen.  Large hiatal hernia. Chest wall and soft tissues are unremarkable without evidence of acute bony abnormalities. Coronary artery calcifications IMPRESSION: Chronic interstitial lung disease Hiatal hernia. Electronically Signed   By: Franky Chard M.D.   On: 09/27/2023 07:29   Result Date: 09/27/2023 CLINICAL DATA:  Chest pain EXAM: Cardiac/Coronary CTA TECHNIQUE: A non-contrast,  gated CT scan was obtained with axial slices of 3 mm through the heart for calcium  scoring. Calcium  scoring was performed using the Agatston method. A 120 kV prospective, gated, contrast cardiac scan was obtained. Gantry rotation speed was 250 msecs and collimation was 0.6 mm. Two sublingual nitroglycerin  tablets (0.8 mg) were given. The 3D data set was reconstructed in 5% intervals of the 35-75% of the R-R cycle. Diastolic phases were analyzed on a dedicated workstation using MPR, MIP, and VRT modes. The patient received 95 cc of contrast. FINDINGS: Image quality: Excellent. Noise artifact is: Limited. Coronary Arteries:  Normal coronary origin.  Right dominance. Left main: The left main is a large  caliber vessel with a normal take off from the left coronary cusp that trifurcates into a LAD, LCX, and ramus intermedius. There is minimal mixed plaque in the ostial LM with associated stenosis of < 25%. Left anterior descending artery: The LAD gives off 2 patent diagonal branches. There is mild to moderate diffuse mixed plaque in the proximal and mid LAD with associated stenosis of up to 50-69%. There is mild calcified plaque in the distal LAD with associated stenosis of 25-49%. Ramus intermedius: Very small and patent with no evidence of plaque or stenosis. Left circumflex artery: The LCX is non-dominant and gives off 2 patent obtuse marginal branches. There is minimal mixed plaque in the mid LCx with associated stenosis of <25%. There is at least moderate soft plaque in the large ostial OM1 with at least 50-69% stenosis. Right coronary artery: The RCA is dominant with normal take off from the right coronary cusp. There is mild mixed plaque in the mid RCA with associated stenosis of 25-49%. There is moderate soft plaque in the distal RCA with associated stenosis of 50-69%. The RCA terminates as a PDA and right posterolateral branch without evidence of plaque or stenosis. Right Atrium: Right atrial size is within  normal limits. Right Ventricle: The right ventricular cavity is within normal limits. Left Atrium: Left atrial size is normal in size with no left atrial appendage filling defect. Left Ventricle: The ventricular cavity size is within normal limits. Pulmonary arteries: Normal in size. Pulmonary veins: Normal pulmonary venous drainage. Pericardium: Normal thickness without significant effusion or calcium  present. Cardiac valves: The aortic valve is trileaflet without significant calcification. The mitral valve is normal without significant calcification. Aorta: Normal caliber with scattered atherosclerotic plaque. Extra-cardiac findings: See attached radiology report for non-cardiac structures. IMPRESSION: 1. Coronary calcium  score of 231. This was 69th percentile for age-, sex, and race-matched controls. 2. Total plaque volume 365 mm3 which is 51st percentile for age- and sex-matched controls (calcified plaque 83mm3; non-calcified plaque 371mm3). TPV is severe. 3. Normal coronary origin with right dominance. 4. Moderate atherosclerosis: 50-69% proximal to mid LAD/diastal RCA and ostial OM1. 5. Consider symptomt guided anti-ischemic and preventive pharmacotherapy as well as risk factor modification per guideline-directed care. 6. This study has been submitted for FFR analysis. RECOMMENDATIONS: 1. CAD-RADS 0: No evidence of CAD (0%). Consider non-atherosclerotic causes of chest pain. 2. CAD-RADS 1: Minimal non-obstructive CAD (0-24%). Consider non-atherosclerotic causes of chest pain. Consider preventive therapy and risk factor modification. 3. CAD-RADS 2: Mild non-obstructive CAD (25-49%). Consider non-atherosclerotic causes of chest pain. Consider preventive therapy and risk factor modification. 4. CAD-RADS 3: Moderate stenosis. Consider symptom-guided anti-ischemic pharmacotherapy as well as risk factor modification per guideline directed care. Additional analysis with CT FFR will be submitted. 5. CAD-RADS 4: Severe  stenosis. (70-99% or > 50% left main). Cardiac catheterization or CT FFR is recommended. Consider symptom-guided anti-ischemic pharmacotherapy as well as risk factor modification per guideline directed care. Invasive coronary angiography recommended with revascularization per published guideline statements. 6. CAD-RADS 5: Total coronary occlusion (100%). Consider cardiac catheterization or viability assessment. Consider symptom-guided anti-ischemic pharmacotherapy as well as risk factor modification per guideline directed care. 7. CAD-RADS N: Non-diagnostic study. Obstructive CAD can't be excluded. Alternative evaluation is recommended. Wilbert Bihari, MD Electronically Signed: By: Wilbert Bihari M.D. On: 09/24/2023 15:10   CT CORONARY FRACTIONAL FLOW RESERVE FLUID ANALYSIS Result Date: 09/24/2023 EXAM: FFRCT ANALYSIS FINDINGS: FFRct analysis was performed on the original cardiac CT angiogram dataset. Diagrammatic representation of the FFRct analysis  is provided in a separate PDF document in PACS. This dictation was created using the PDF document and an interactive 3D model of the results. 3D model is not available in the EMR/PACS. Normal FFR range is >0.80. 1. Left Main: No significant stenosis.  FFR 1.00. 2. LAD: Probable stenosis. Proximal 0.97, Mid 0.85 Distal 0.67. 3. LCX: No significant stenosis. Proximal 0.99, Mid 0.99, Distal 0.90. OM1: Proximal 0.90, Mid 0.84, Distal 0.79. 4. Ramus: No significant stenosis. proximal 1.00, Mid 0.93, Distal not modeled. 5. RCA: No significant stenosis. proximal 0.98, mid 0.91, Distal 0.89. IMPRESSION: 1. Coronary CTA FFR flow analysis demonstrates a possible flow limiting lesion in the mid LAD (0.85>>0.67). 2. Recommend cardiac catheterization. Electronically Signed   By: Wilbert Bihari M.D.   On: 09/24/2023 15:24    Disposition Pt is being discharged home today in good condition.  Follow-up Plans & Appointments  Follow-up Information     Daneen Damien BROCKS, NP Follow up.    Specialties: Cardiology, Family Medicine Why: 10/19/2023 at 2:45 Contact information: [address] [place] [address] [phone]                Discharge Instructions     Amb Referral to Cardiac Rehabilitation   Complete by: As directed    Diagnosis: Coronary Stents   After initial evaluation and assessments completed: Virtual Based Care may be provided alone or in conjunction with Phase 2 Cardiac Rehab based on patient barriers.: Yes   Intensive Cardiac Rehabilitation (ICR) [facility] location only OR Traditional Cardiac Rehabilitation (TCR) *If criteria for ICR are not met will enroll in TCR [facility] only): Yes   Diet - low sodium heart healthy   Complete by: As directed    Discharge instructions   Complete by: As directed    Radial Site Care Refer to this sheet in the next few weeks. These instructions provide you with information on caring for yourself after your procedure. Your caregiver may also give you more specific instructions. Your treatment has been planned according to current medical practices, but problems sometimes occur. Call your caregiver if you have any problems or questions after your procedure. HOME CARE INSTRUCTIONS You may shower the day after the procedure. Remove the bandage (dressing) and gently wash the site with plain soap and water . Gently pat the site dry.  Do not apply powder or lotion to the site.  Do not submerge the affected site in water  for 3 to 5 days.  Inspect the site at least twice daily.  Do not flex or bend the affected arm for 24 hours.  No lifting over 5 pounds (2.3 kg) for 5 days after your procedure.  Do not drive home if you are discharged the same day of the procedure. Have someone else drive you.  You may drive 24 hours after the procedure unless otherwise instructed by your caregiver.  What to expect: Any bruising will usually fade within 1 to 2 weeks.  Blood that collects in the tissue (hematoma) may be painful to the  touch. It should usually decrease in size and tenderness within 1 to 2 weeks.  SEEK IMMEDIATE MEDICAL CARE IF: You have unusual pain at the radial site.  You have redness, warmth, swelling, or pain at the radial site.  You have drainage (other than a small amount of blood on the dressing).  You have chills.  You have a fever or persistent symptoms for more than 72 hours.  You have a fever and your symptoms suddenly get  worse.  Your arm becomes pale, cool, tingly, or numb.  You have heavy bleeding from the site. Hold pressure on the site.   Increase activity slowly   Complete by: As directed        Discharge Medications Allergies as of 10/05/2023       Reactions   Levaquin  [levofloxacin  In D5w] Diarrhea   Penicillins Rash        Medication List     TAKE these medications    acetaminophen  500 MG tablet Commonly known as: TYLENOL  Take 1,000 mg by mouth every 6 (six) hours as needed for moderate pain or headache.   albuterol  108 (90 Base) MCG/ACT inhaler Commonly known as: VENTOLIN  HFA Inhale 2 puffs into the lungs every 6 (six) hours as needed for wheezing or shortness of breath.   albuterol  (2.5 MG/3ML) 0.083% nebulizer solution Commonly known as: PROVENTIL  Take 3 mLs (2.5 mg total) by nebulization every 6 (six) hours as needed for wheezing or shortness of breath.   amLODipine  10 MG tablet Commonly known as: NORVASC  Take 1 tablet (10 mg total) by mouth daily.   apixaban  5 MG Tabs tablet Commonly known as: ELIQUIS  Take 1 tablet (5 mg total) by mouth 2 (two) times daily.   aspirin  81 MG chewable tablet Chew 1 tablet (81 mg total) by mouth daily.   bisoprolol  10 MG tablet Commonly known as: ZEBETA  Take 1 tablet (10 mg total) by mouth daily.   clopidogrel  75 MG tablet Commonly known as: PLAVIX  Take 1 tablet (75 mg total) by mouth daily with breakfast.   Entresto  97-103 MG Generic drug: sacubitril -valsartan  Take 1 tablet by mouth twice daily   glucose blood  test strip Commonly known as: OneTouch Verio Use as instructed   nitroGLYCERIN  0.4 MG SL tablet Commonly known as: Nitrostat  Place 1 tablet (0.4 mg total) under the tongue every 5 (five) minutes as needed for chest pain.   OneTouch Delica Lancets Fine Misc Use to check BG once daily   rosuvastatin  40 MG tablet Commonly known as: CRESTOR  Take 1 tablet (40 mg total) by mouth daily.   spironolactone  25 MG tablet Commonly known as: ALDACTONE  Take 1 tablet (25 mg total) by mouth daily.          Outstanding Labs/Studies Check BMP 1 week   Duration of Discharge Encounter: APP Time: 30 minutes   Signed, Thom LITTIE Sluder, PA-C 10/05/2023, 12:12 PM  History and all data above reviewed.  Patient examined.  I agree with the findings as above.  No pain.  No SOB. The patient exam reveals COR:RRR  ,  Lungs: Clear  ,  Abd: Positive bowel sounds, no rebound no guarding, Ext Right radial with slight hematoma and bruising.   .  All available labs, radiology testing, previous records reviewed. Agree with documented assessment and plan. CAD:  OK for discharge.  Follow up arranged.      HTN:  Add spironolactone  and follow BP at home.   Dyslipidemia: Increased Crestor .    Lynwood Schilling  12:18 PM  10/05/2023

## 2023-10-06 LAB — LIPOPROTEIN A (LPA): Lipoprotein (a): 65.4 nmol/L — ABNORMAL HIGH (ref <75.0)

## 2023-10-19 ENCOUNTER — Encounter: Payer: Self-pay | Admitting: Nurse Practitioner

## 2023-10-19 ENCOUNTER — Ambulatory Visit: Attending: Nurse Practitioner | Admitting: Nurse Practitioner

## 2023-10-19 VITALS — BP 134/64 | HR 53 | Ht 64.0 in | Wt 159.6 lb

## 2023-10-19 DIAGNOSIS — E785 Hyperlipidemia, unspecified: Secondary | ICD-10-CM | POA: Diagnosis not present

## 2023-10-19 DIAGNOSIS — E1169 Type 2 diabetes mellitus with other specified complication: Secondary | ICD-10-CM

## 2023-10-19 DIAGNOSIS — G4733 Obstructive sleep apnea (adult) (pediatric): Secondary | ICD-10-CM

## 2023-10-19 DIAGNOSIS — Z86718 Personal history of other venous thrombosis and embolism: Secondary | ICD-10-CM

## 2023-10-19 DIAGNOSIS — I428 Other cardiomyopathies: Secondary | ICD-10-CM | POA: Diagnosis not present

## 2023-10-19 DIAGNOSIS — I340 Nonrheumatic mitral (valve) insufficiency: Secondary | ICD-10-CM

## 2023-10-19 DIAGNOSIS — R0609 Other forms of dyspnea: Secondary | ICD-10-CM

## 2023-10-19 DIAGNOSIS — I10 Essential (primary) hypertension: Secondary | ICD-10-CM

## 2023-10-19 DIAGNOSIS — R011 Cardiac murmur, unspecified: Secondary | ICD-10-CM

## 2023-10-19 DIAGNOSIS — I2510 Atherosclerotic heart disease of native coronary artery without angina pectoris: Secondary | ICD-10-CM

## 2023-10-19 MED ORDER — BISOPROLOL FUMARATE 5 MG PO TABS: 5.0000 mg | ORAL_TABLET | Freq: Every day | ORAL | 3 refills | Status: AC

## 2023-10-19 NOTE — Progress Notes (Unsigned)
 Office Visit    Patient Name: [name] Date of Encounter: 10/19/2023  Primary Care Provider:  Lavell Bari LABOR, FNP Primary Cardiologist:  Lynwood Schilling, MD  Chief Complaint    78 year old female with a history of CAD s/p DES x 2-LAD, DES-OM 1 in 09/2023, NICM with improved EF, mitral valve regurgitation, aortic atherosclerosis, hypertension, hyperlipidemia, DVT, CVA, type 2 diabetes, and OSA not on CPAP who presents for follow-up related to CAD s/p DES x 3.  Past Medical History    Past Medical History:  Diagnosis Date   Asthma    Chronic systolic heart failure (HCC)    Echo (08/26/13):  Mild LVH. EF 25% to 30%. Diffuse HK. Aortic valve: There was trivial regurgitation. Mitral valve: There was mild regurgitation. Left atrium: The atrium was mildly dilated.   GERD (gastroesophageal reflux disease)    Headache(784.0)    History of kidney stones    History of nephrolithiasis    Hypertension    NICM (nonischemic cardiomyopathy) (HCC)    a. LHC (08/2013):  no CAD   Sleep apnea    does not use machine   Stroke (HCC)    Type II or unspecified type diabetes mellitus without mention of complication, uncontrolled    borderline   Past Surgical History:  Procedure Laterality Date   ABDOMINAL HYSTERECTOMY     CATARACT EXTRACTION     COLONOSCOPY WITH PROPOFOL  N/A 07/01/2019   Procedure: COLONOSCOPY WITH PROPOFOL ;  Surgeon: Harvey Margo CROME, MD;  [facility]: [facility];  Service: Endoscopy;  Laterality: N/A;  2:15pm - pt will not move up   CORONARY STENT INTERVENTION N/A 10/04/2023   Procedure: CORONARY STENT INTERVENTION;  Surgeon: Swazil[place], Peter M, MD;  Location: [facility] INVASIVE CV LAB;  Service: Cardiovascular;  Laterality: N/A;   CYSTOSCOPY WITH RETROGRADE PYELOGRAM, URETEROSCOPY AND STENT PLACEMENT Left 08/11/2021   Procedure: CYSTOSCOPY WITH RETROGRADE PYELOGRAM, URETEROSCOPY AND STENT PLACEMENT;  Surgeon: Sherrilee Belvie CROME, MD;  Location: [facility];  Service: Urology;  Laterality:  Left;   CYSTOSCOPY WITH RETROGRADE PYELOGRAM, URETEROSCOPY AND STENT PLACEMENT Left 09/08/2021   Procedure: CYSTOSCOPY WITH RETROGRADE PYELOGRAM, URETEROSCOPY AND STENT EXCHANGE;  Surgeon: Sherrilee Belvie CROME, MD;  Location: [facility];  Service: Urology;  Laterality: Left;   CYSTOSCOPY WITH RETROGRADE PYELOGRAM, URETEROSCOPY AND STENT PLACEMENT Right 10/06/2021   Procedure: CYSTOSCOPY WITH RETROGRADE PYELOGRAM, URETEROSCOPY AND STENT PLACEMENT;  Surgeon: Sherrilee Belvie CROME, MD;  Location: [facility];  Service: Urology;  Laterality: Right;   CYSTOSCOPY WITH RETROGRADE PYELOGRAM, URETEROSCOPY AND STENT PLACEMENT Right 10/27/2021   Procedure: CYSTOSCOPY WITH RETROGRADE PYELOGRAM, URETEROSCOPY AND STENT EXCHANGE;  Surgeon: Sherrilee Belvie CROME, MD;  Location: [facility];  Service: Urology;  Laterality: Right;   EXTRACORPOREAL SHOCK WAVE LITHOTRIPSY Left 05/31/2021   Procedure: EXTRACORPOREAL SHOCK WAVE LITHOTRIPSY (ESWL);  Surgeon: Sherrilee Belvie CROME, MD;  Location: [facility];  Service: Urology;  Laterality: Left;   EXTRACORPOREAL SHOCK WAVE LITHOTRIPSY Left 06/28/2021   Procedure: EXTRACORPOREAL SHOCK WAVE LITHOTRIPSY (ESWL);  Surgeon: Sherrilee Belvie CROME, MD;  Location: [facility];  Service: Urology;  Laterality: Left;   HOLMIUM LASER APPLICATION Left 08/11/2021   Procedure: HOLMIUM LASER APPLICATION;  Surgeon: Sherrilee Belvie CROME, MD;  Location: [facility];  Service: Urology;  Laterality: Left;   HOLMIUM LASER APPLICATION Left 09/08/2021   Procedure: HOLMIUM LASER APPLICATION;  Surgeon: Sherrilee Belvie CROME, MD;  Location: [facility];  Service: Urology;  Laterality: Left;   HOLMIUM LASER APPLICATION Right 10/06/2021   Procedure: HOLMIUM LASER APPLICATION;  Surgeon: Sherrilee Belvie CROME, MD;  Location: [facility];  Service: Urology;  Laterality: Right;   HOLMIUM LASER APPLICATION Right 10/27/2021   Procedure: HOLMIUM LASER APPLICATION;  Surgeon: Sherrilee Belvie CROME, MD;  Location: [facility];  Service: Urology;  Laterality: Right;    LEFT AND RIGHT HEART CATHETERIZATION WITH CORONARY ANGIOGRAM N/A 09/03/2013   Procedure: LEFT AND RIGHT HEART CATHETERIZATION WITH CORONARY ANGIOGRAM;  Surgeon: Lonni JONETTA Cash, MD;  Location: [facility] CATH LAB;  Service: Cardiovascular;  Laterality: N/A;   LEFT HEART CATH AND CORONARY ANGIOGRAPHY N/A 10/04/2023   Procedure: LEFT HEART CATH AND CORONARY ANGIOGRAPHY;  Surgeon: Swazil[place], Peter M, MD;  Location: [facility] INVASIVE CV LAB;  Service: Cardiovascular;  Laterality: N/A;   OOPHORECTOMY     removed 1 ovary and 1 ovary remains.   POLYPECTOMY  07/01/2019   Procedure: POLYPECTOMY;  Surgeon: Harvey Margo CROME, MD;  [facility]: [facility];  Service: Endoscopy;;   STONE EXTRACTION WITH BASKET Left 09/08/2021   Procedure: STONE EXTRACTION WITH BASKET;  Surgeon: Sherrilee Belvie CROME, MD;  Location: [facility];  Service: Urology;  Laterality: Left;   STONE EXTRACTION WITH BASKET Right 10/27/2021   Procedure: STONE EXTRACTION WITH BASKET;  Surgeon: Sherrilee Belvie CROME, MD;  Location: [facility];  Service: Urology;  Laterality: Right;   ureteral extraction of kidney stone      Allergies  Allergies  Allergen Reactions   Levaquin  [Levofloxacin  In D5w] Diarrhea   Penicillins Rash     Labs/Other Studies Reviewed    The following studies were reviewed today:  Cardiac Studies & Procedures   ______________________________________________________________________________________________ CARDIAC CATHETERIZATION  CARDIAC CATHETERIZATION 10/04/2023  Conclusion   Mid LAD lesion is 85% stenosed.   1st Diag lesion is 80% stenosed.   1st Mrg lesion is 85% stenosed.   A drug-eluting stent was successfully placed using a STENT SYNERGY XD 3.0X16.   A drug-eluting stent was successfully placed using a STENT SYNERGY XD 3.0X12.   A drug-eluting stent was successfully placed using a STENT SYNERGY XD 2.50X12.   Post intervention, there is a 0% residual stenosis.   Post intervention, there is a 0% residual stenosis.   The  left ventricular systolic function is normal.   LV end diastolic pressure is normal.   Recommend to resume Apixaban , at currently prescribed dose and frequency on 10/05/2023.   Recommend concurrent antiplatelet therapy of Aspirin  81 mg for 1 month and Clopidogrel  75mg  daily for 6 months .  Severe 2 vessel obstructive CAD Normal LV function Normal LVEDP 15 mm Hg Successful PCI of the mid LAD complicated by proximal  edge dissection. Successfully treated with DES x 2. Initial compromise of diagonal flow but this improved with IC Ntg and the patient was pain free without Ecg changes Successful PCI of the first OM with DES x 1  Plan: due to complexity of intervention and severe HTN will observe overnight tonight. Anticipate DC in am. Will treat with ASA for one week and Plavix  for 6 months. Anticipate resuming Eliquis  tomorrow.  Findings Coronary Findings Diagnostic  Dominance: Right  Left Anterior Descending Mid LAD lesion is 85% stenosed.  First Diagonal Branch Vessel is small in size. 1st Diag lesion is 80% stenosed. The lesion is segmental.  Left Circumflex Vessel is large.  First Obtuse Marginal Branch 1st Mrg lesion is 85% stenosed.  Third Obtuse Marginal Branch Vessel is large in size.  Right Coronary Artery Vessel was injected. Vessel is normal in caliber. The vessel exhibits minimal luminal irregularities.  Intervention  Mid LAD lesion Stent CATH LAUNCHER 6FR EBU 3.75 guide catheter was inserted. Lesion crossed with guidewire using a WIRE ASAHI PROWATER 180CM. A drug-eluting stent was successfully placed using a STENT SYNERGY XD 3.0X16. Maximum pressure: 14 atm. Stent strut is well apposed. Post-stent angioplasty was not performed. Stent A drug-eluting stent was successfully placed using a STENT SYNERGY XD 3.0X12. Maximum pressure: 16 atm. Stent strut is well apposed. Stent overlaps previously placed stent. Post-Intervention Lesion Assessment The intervention was  successful. Pre-interventional TIMI flow is 3. Post-intervention TIMI flow is 3. No complications occurred at this lesion. There is a 0% residual stenosis post intervention.  1st Mrg lesion Stent Lesion crossed with guidewire using a WIRE ASAHI PROWATER 180CM. Pre-stent angioplasty was performed using a BALLOON Vidor EMERGE MR 2.5X12. A drug-eluting stent was successfully placed using a STENT SYNERGY XD 2.50X12. Maximum pressure: 16 atm. Stent strut is well apposed. Post-stent angioplasty was not performed. Post-Intervention Lesion Assessment The intervention was successful. Pre-interventional TIMI flow is 3. Post-intervention TIMI flow is 3. No complications occurred at this lesion. There is a 0% residual stenosis post intervention.     ECHOCARDIOGRAM  ECHOCARDIOGRAM COMPLETE 03/18/2019  Narrative ECHOCARDIOGRAM REPORT    Patient Name:   [name] Date of Exam: 03/18/2019 Medical Rec #:  [id number]        Height:       64.0 in Accession #:    [id number]       Weight:       175.0 lb Date of Birth:  07-15-1945        BSA:          1.85 m Patient Age:    73 years         BP:           185/77 mmHg Patient Gender: F                HR:           63 bpm. Exam Location:  [facility]  Procedure: 2D Echo, Cardiac Doppler and Color Doppler  Indications:    I42.9 (ICD-10-CM) - Cardiomyopathy, unspecified type (HCC) I10 (ICD-10-CM) - Essential hypertension R94.31 (ICD-10-CM) - Abnormal EKG  History:        Patient has prior history of Echocardiogram examinations, most recent 03/24/2016. CHF; Risk Factors:Hypertension, Diabetes and Dyslipidemia. H/O CVA.  Sonographer:    Aida Pizza RCS Referring Phys: [id number] [id number] HOCHREIN  IMPRESSIONS   1. Left ventricular ejection fraction, by visual estimation, is 60 to 65%. The left ventricle has normal function. There is mildly increased left ventricular hypertrophy. 2. Elevated left ventricular end-diastolic pressure. 3. Left ventricular  diastolic parameters are consistent with Grade I diastolic dysfunction (impaired relaxation). 4. The left ventricle has no regional wall motion abnormalities. 5. Global right ventricle has normal systolic function.The right ventricular size is normal. No increase in right ventricular wall thickness. 6. Left atrial size was normal. 7. Right atrial size was normal. 8. The mitral valve is grossly normal. Mild mitral valve regurgitation. 9. The tricuspid valve is grossly normal. Tricuspid valve regurgitation is trivial. 10. The aortic valve is tricuspid. Aortic valve regurgitation is not visualized. 11. The pulmonic valve was grossly normal. Pulmonic valve regurgitation is not visualized. 12. The inferior vena cava is normal in size with greater than 50% respiratory variability, suggesting right atrial pressure of 3 mmHg.  FINDINGS Left Ventricle: Left ventricular ejection fraction, by visual estimation, is 60 to 65%. The left  ventricle has normal function. The left ventricle has no regional wall motion abnormalities. The left ventricular internal cavity size was the left ventricle is normal in size. There is mildly increased left ventricular hypertrophy. Concentric left ventricular hypertrophy. Left ventricular diastolic parameters are consistent with Grade I diastolic dysfunction (impaired relaxation). Elevated left ventricular end-diastolic pressure.  Right Ventricle: The right ventricular size is normal. No increase in right ventricular wall thickness. Global RV systolic function is has normal systolic function.  Left Atrium: Left atrial size was normal in size.  Right Atrium: Right atrial size was normal in size  Pericardium: There is no evidence of pericardial effusion.  Mitral Valve: The mitral valve is grossly normal. Mild mitral valve regurgitation.  Tricuspid Valve: The tricuspid valve is grossly normal. Tricuspid valve regurgitation is trivial.  Aortic Valve: The aortic valve is  tricuspid. Aortic valve regurgitation is not visualized. Moderate aortic valve annular calcification.  Pulmonic Valve: The pulmonic valve was grossly normal. Pulmonic valve regurgitation is not visualized.  Aorta: The aortic root is normal in size and structure.  Venous: The inferior vena cava is normal in size with greater than 50% respiratory variability, suggesting right atrial pressure of 3 mmHg.  IAS/Shunts: No atrial level shunt detected by color flow Doppler.   LEFT VENTRICLE PLAX 2D LVIDd:         4.75 cm       Diastology LVIDs:         2.69 cm       LV e' lateral:   5.98 cm/s LV PW:         1.21 cm       LV E/e' lateral: 13.5 LV IVS:        1.28 cm       LV e' medial:    3.92 cm/s LVOT diam:     1.80 cm       LV E/e' medial:  20.6 LV SV:         78 ml LV SV Index:   40.72 LVOT Area:     2.54 cm  LV Volumes (MOD) LV area d, A2C:    25.00 cm LV area d, A4C:    29.10 cm LV area s, A2C:    14.10 cm LV area s, A4C:    15.70 cm LV major d, A2C:   7.50 cm LV major d, A4C:   7.76 cm LV major s, A2C:   6.53 cm LV major s, A4C:   6.73 cm LV vol d, MOD A2C: 71.2 ml LV vol d, MOD A4C: 90.1 ml LV vol s, MOD A2C: 26.5 ml LV vol s, MOD A4C: 30.8 ml LV SV MOD A2C:     44.7 ml LV SV MOD A4C:     90.1 ml LV SV MOD BP:      52.2 ml  RIGHT VENTRICLE RV S prime:     10.00 cm/s TAPSE (M-mode): 1.9 cm  LEFT ATRIUM             Index       RIGHT ATRIUM           Index LA diam:        3.80 cm 2.06 cm/m  RA Area:     12.20 cm LA Vol (A2C):   46.4 ml 25.10 ml/m RA Volume:   29.30 ml  15.85 ml/m LA Vol (A4C):   55.6 ml 30.08 ml/m LA Biplane Vol: 51.3 ml 27.75 ml/m AORTIC VALVE LVOT Vmax:  90.00 cm/s LVOT Vmean:  59.400 cm/s LVOT VTI:    0.192 m  AORTA Ao Root diam: 3.20 cm  MITRAL VALVE MV Area (PHT): 2.99 cm              SHUNTS MV PHT:        73.66 msec            Systemic VTI:  0.19 m MV Decel Time: 254 msec              Systemic Diam: 1.80 cm MV E velocity:  80.60 cm/s  103 cm/s MV A velocity: 107.00 cm/s 70.3 cm/s MV E/A ratio:  0.75        1.5   Pearla Rout MD Electronically signed by Pearla Rout MD Signature Date/Time: 03/18/2019/3:19:27 PM    Final      CT SCANS  CT CORONARY MORPH W/CTA COR W/SCORE 09/21/2023  Addendum 09/27/2023  7:32 AM ADDENDUM REPORT: 09/27/2023 07:29  ADDENDUM: OVER-READ INTERPRETATION  CT CHEST  The following report is an over-read performed by radiologist Dr. Franky Puffer [facility] on 06/29/2023. This over-read does not include interpretation of cardiac or coronary anatomy or pathology. The interpretation by the cardiologist is attached.  COMPARISON:  None.  FINDINGS: Within the visualized portions of the lungs demonstrates prominence of the interstitial markings bilaterally correlate with chronic interstitial lung disease some nodular atelectatic changes of the right middle lobe without evidence of suspicious pulmonary nodules. No acute infiltrates consolidations. No pulmonary nodules or masses.  No pleural effusions.  No mediastinal masses or adenopathy. No pericardial effusions.  Aorta appears normal caliber.  No acute findings in the upper abdomen.  Large hiatal hernia.  Chest wall and soft tissues are unremarkable without evidence of acute bony abnormalities.  Coronary artery calcifications  IMPRESSION: Chronic interstitial lung disease  Hiatal hernia.   Electronically Signed By: Franky Chard M.D. On: 09/27/2023 07:29  Narrative CLINICAL DATA:  Chest pain  EXAM: Cardiac/Coronary CTA  TECHNIQUE: A non-contrast, gated CT scan was obtained with axial slices of 3 mm through the heart for calcium  scoring. Calcium  scoring was performed using the Agatston method. A 120 kV prospective, gated, contrast cardiac scan was obtained. Gantry rotation speed was 250 msecs and collimation was 0.6 mm. Two sublingual nitroglycerin  tablets (0.8 mg) were given.  The 3D data set was reconstructed in 5% intervals of the 35-75% of the R-R cycle. Diastolic phases were analyzed on a dedicated workstation using MPR, MIP, and VRT modes. The patient received 95 cc of contrast.  FINDINGS: Image quality: Excellent.  Noise artifact is: Limited.  Coronary Arteries:  Normal coronary origin.  Right dominance.  Left main: The left main is a large caliber vessel with a normal take off from the left coronary cusp that trifurcates into a LAD, LCX, and ramus intermedius. There is minimal mixed plaque in the ostial LM with associated stenosis of < 25%.  Left anterior descending artery: The LAD gives off 2 patent diagonal branches. There is mild to moderate diffuse mixed plaque in the proximal and mid LAD with associated stenosis of up to 50-69%. There is mild calcified plaque in the distal LAD with associated stenosis of 25-49%.  Ramus intermedius: Very small and patent with no evidence of plaque or stenosis.  Left circumflex artery: The LCX is non-dominant and gives off 2 patent obtuse marginal branches. There is minimal mixed plaque in the mid LCx with associated stenosis of <25%. There is at least moderate soft plaque  in the large ostial OM1 with at least 50-69% stenosis.  Right coronary artery: The RCA is dominant with normal take off from the right coronary cusp. There is mild mixed plaque in the mid RCA with associated stenosis of 25-49%. There is moderate soft plaque in the distal RCA with associated stenosis of 50-69%. The RCA terminates as a PDA and right posterolateral branch without evidence of plaque or stenosis.  Right Atrium: Right atrial size is within normal limits.  Right Ventricle: The right ventricular cavity is within normal limits.  Left Atrium: Left atrial size is normal in size with no left atrial appendage filling defect.  Left Ventricle: The ventricular cavity size is within normal limits.  Pulmonary arteries: Normal in  size.  Pulmonary veins: Normal pulmonary venous drainage.  Pericardium: Normal thickness without significant effusion or calcium  present.  Cardiac valves: The aortic valve is trileaflet without significant calcification. The mitral valve is normal without significant calcification.  Aorta: Normal caliber with scattered atherosclerotic plaque.  Extra-cardiac findings: See attached radiology report for non-cardiac structures.  IMPRESSION: 1. Coronary calcium  score of 231. This was 69th percentile for age-, sex, and race-matched controls.  2. Total plaque volume 365 mm3 which is 51st percentile for age- and sex-matched controls (calcified plaque 99mm3; non-calcified plaque 335mm3). TPV is severe.  3. Normal coronary origin with right dominance.  4. Moderate atherosclerosis: 50-69% proximal to mid LAD/diastal RCA and ostial OM1.  5. Consider symptomt guided anti-ischemic and preventive pharmacotherapy as well as risk factor modification per guideline-directed care.  6. This study has been submitted for FFR analysis.  RECOMMENDATIONS: 1. CAD-RADS 0: No evidence of CAD (0%). Consider non-atherosclerotic causes of chest pain.  2. CAD-RADS 1: Minimal non-obstructive CAD (0-24%). Consider non-atherosclerotic causes of chest pain. Consider preventive therapy and risk factor modification.  3. CAD-RADS 2: Mild non-obstructive CAD (25-49%). Consider non-atherosclerotic causes of chest pain. Consider preventive therapy and risk factor modification.  4. CAD-RADS 3: Moderate stenosis. Consider symptom-guided anti-ischemic pharmacotherapy as well as risk factor modification per guideline directed care. Additional analysis with CT FFR will be submitted.  5. CAD-RADS 4: Severe stenosis. (70-99% or > 50% left main). Cardiac catheterization or CT FFR is recommended. Consider symptom-guided anti-ischemic pharmacotherapy as well as risk factor modification per guideline directed care.  Invasive coronary angiography recommended with revascularization per published guideline statements.  6. CAD-RADS 5: Total coronary occlusion (100%). Consider cardiac catheterization or viability assessment. Consider symptom-guided anti-ischemic pharmacotherapy as well as risk factor modification per guideline directed care.  7. CAD-RADS N: Non-diagnostic study. Obstructive CAD can't be excluded. Alternative evaluation is recommended.  Wilbert Bihari, MD  Electronically Signed: By: Wilbert Bihari M.D. On: 09/24/2023 15:10     ______________________________________________________________________________________________     Recent Labs: 10/05/2023: BUN 20; Creatinine, Ser 1.05; Hemoglobin 13.8; Platelets 243; Potassium 3.3; Sodium 142  Recent Lipid Panel    Component Value Date/Time   CHOL 213 (H) 11/16/2022 1153   TRIG 175 (H) 11/16/2022 1153   HDL 44 11/16/2022 1153   CHOLHDL 4.8 (H) 11/16/2022 1153   CHOLHDL 4.6 12/17/2013 0353   VLDL 69 (H) 12/17/2013 0353   LDLCALC 138 (H) 11/16/2022 1153    History of Present Illness    78 year old female with the above past medical history including CAD s/p DES x 2-LAD, DES-OM 1 in 09/2023, NICM with recovered EF, mitral valve regurgitation, aortic atherosclerosis, hypertension, hyperlipidemia, DVT, CVA, type 2 diabetes, and OSA not on CPAP who presents for follow-up related to CAD s/p  DES x 3.  She has a history of nonischemic cardiomyopathy, prior of 30%. Echocardiogram in 2020 showed EF 60 to 65%, normal LV function, mild LVH, G1 DD, normal RV systolic function, mild mitral valve regurgitation.  Additionally, she has a history of unprovoked DVT, on chronic Eliquis .  She was last seen in the office on 08/24/2023 and reported intermittent jaw discomfort, associated nausea, decreased activity tolerance with mild chest discomfort.  Coronary CTA in 09/2023 revealed coronary calcium  score of 231 (69th percentile), severe TPV, FFR showed possible  flow-limiting lesion in the mid LAD.  Noncardiac findings included chronic interstitial lung disease.  Cardiac catheterization on 10/04/2023 which revealed 95% mid LAD stenosis, 80% D1 stenosis, 85% OM1 stenosis s/p DES x 1, PCI of the LAD was complicated by proximal edge dissection, s/p DES x 2.  Triple therapy was recommended x 1 week with aspirin , Plavix , Eliquis , followed by Plavix  and Eliquis .  She was noted to have a faint murmur.  Outpatient echocardiogram was recommended.  She presents today for follow-up.  Since her last visit and since her procedure Been stable from a cardiac standpoint.  She does note ongoing fatigue, dyspnea on exertion.  She notes rare fleeting chest discomfort this occurs at rest last for 5 to 10 seconds and resolve spontaneously.  She has been somewhat depressed due to personal stress.  She was told she needed a CPAP, however she did not tolerate the mask.  Given evidence of interstitial lung disease on CT, ongoing dyspnea on exertion, need for possible sleep apnea management, will refer to pulmonology.  Will repeat echocardiogram.  Will check fasting lipids, CMET prior to follow-up.  Heart rate is low in office today.  In the setting of fatigue, will decrease bisoprolol  to 5 mg daily.  Continue to monitor BP report BP consistent greater than 140/80.  If she fatigue persists, consider de-escalation of statin therapy, possible need for referral to lipid clinic Pharm.D.  Follow-up in 2 to 3 months, sooner if needed.  Denies bleeding.  Hemoglobin was stable.Chronic right lower extremity edema in the setting of DVT.  Stable.  Right radial cath site with bruising in the healing stages, no bleeding, bruit, hematoma. CAD: NICM with recovered EF: Cardiac murmur/mitral valve regurgitation: Hypertension: Hyperlipidemia: History of DVT: Type 2 diabetes: OSA: Disposition:  Home Medications    Current Outpatient Medications  Medication Sig Dispense Refill   acetaminophen  (TYLENOL )  500 MG tablet Take 1,000 mg by mouth every 6 (six) hours as needed for moderate pain or headache.     albuterol  (PROVENTIL ) (2.5 MG/3ML) 0.083% nebulizer solution Take 3 mLs (2.5 mg total) by nebulization every 6 (six) hours as needed for wheezing or shortness of breath. 150 mL 1   albuterol  (VENTOLIN  HFA) 108 (90 Base) MCG/ACT inhaler Inhale 2 puffs into the lungs every 6 (six) hours as needed for wheezing or shortness of breath. 18 g 0   amLODipine  (NORVASC ) 10 MG tablet Take 1 tablet (10 mg total) by mouth daily. 90 tablet 2   apixaban  (ELIQUIS ) 5 MG TABS tablet Take 1 tablet (5 mg total) by mouth 2 (two) times daily. 60 tablet 6   aspirin  81 MG chewable tablet Chew 1 tablet (81 mg total) by mouth daily.     bisoprolol  (ZEBETA ) 10 MG tablet Take 1 tablet (10 mg total) by mouth daily. 90 tablet 1   clopidogrel  (PLAVIX ) 75 MG tablet Take 1 tablet (75 mg total) by mouth daily with breakfast. 30 tablet 5  glucose blood (ONETOUCH VERIO) test strip Use as instructed (Patient not taking: Reported on 10/19/2023) 100 each 12   nitroGLYCERIN  (NITROSTAT ) 0.4 MG SL tablet Place 1 tablet (0.4 mg total) under the tongue every 5 (five) minutes as needed for chest pain. 25 tablet 2   ONETOUCH DELICA LANCETS FINE MISC Use to check BG once daily (Patient not taking: Reported on 10/19/2023) 100 each 2   rosuvastatin  (CRESTOR ) 40 MG tablet Take 1 tablet (40 mg total) by mouth daily. 30 tablet 11   sacubitril -valsartan  (ENTRESTO ) 97-103 MG Take 1 tablet by mouth twice daily 90 tablet 3   spironolactone  (ALDACTONE ) 25 MG tablet Take 1 tablet (25 mg total) by mouth daily. 30 tablet 6   No current facility-administered medications for this visit.     Review of Systems    ***.  All other systems reviewed and are otherwise negative except as noted above. {The patient has an active order for outpatient cardiac rehabilitation.   Please indicate if the patient is ready to start. Do NOT delete this.  It will auto delete.   Refresh note, then sign.              Click here to document readiness and see contraindications.  :1}  Cardiac Rehabilitation Eligibility Assessment      Physical Exam    VS:  BP 134/64   Pulse (!) 53   Ht 5' 4 (1.626 m)   Wt 159 lb 9.6 oz (72.4 kg)   LMP 01/22/1981 (LMP Unknown)   SpO2 97%   BMI 27.40 kg/m   GEN: Well nourished, well developed, in no acute distress. HEENT: normal. Neck: Supple, no JVD, carotid bruits, or masses. Cardiac: RRR, no murmurs, rubs, or gallops. No clubbing, cyanosis, edema.  Radials/DP/PT 2+ and equal bilaterally.  Respiratory:  Respirations regular and unlabored, clear to auscultation bilaterally. GI: Soft, nontender, nondistended, BS + x 4. MS: no deformity or atrophy. Skin: warm and dry, no rash. Neuro:  Strength and sensation are intact. Psych: Normal affect.  Accessory Clinical Findings    ECG personally reviewed by me today - EKG Interpretation Date/Time:  Friday October 19 2023 15:01:29 EDT Ventricular Rate:  53 PR Interval:  178 QRS Duration:  100 QT Interval:  456 QTC Calculation: 427 R Axis:   -64  Text Interpretation: Sinus bradycardia Left axis deviation Pulmonary disease pattern Minimal voltage criteria for LVH, may be normal variant ( [employer] product ) Nonspecific T wave abnormality When compared with ECG of 05-Oct-2023 05:06, T wave inversion no longer evident in Lateral leads Confirmed by Daneen Bagot ([id number]) on 10/19/2023 3:37:29 PM  - no acute changes.   Lab Results  Component Value Date   WBC 10.4 10/05/2023   HGB 13.8 10/05/2023   HCT 42.1 10/05/2023   MCV 88.6 10/05/2023   PLT 243 10/05/2023   Lab Results  Component Value Date   CREATININE 1.05 (H) 10/05/2023   BUN 20 10/05/2023   NA 142 10/05/2023   K 3.3 (L) 10/05/2023   CL 109 10/05/2023   CO2 22 10/05/2023   Lab Results  Component Value Date   ALT 12 05/05/2021   AST 14 05/05/2021   ALKPHOS 73 05/05/2021   BILITOT 0.3 05/05/2021   Lab Results   Component Value Date   CHOL 213 (H) 11/16/2022   HDL 44 11/16/2022   LDLCALC 138 (H) 11/16/2022   TRIG 175 (H) 11/16/2022   CHOLHDL 4.8 (H) 11/16/2022    Lab Results  Component  Value Date   HGBA1C 7.5 (H) 11/16/2022    Assessment & Plan    1.  ***      Damien JAYSON Braver, NP 10/19/2023, 3:39 PM

## 2023-10-19 NOTE — Patient Instructions (Signed)
 Medication Instructions:  Decrease Bisoprolol  5 mg daily  *If you need a refill on your cardiac medications before your next appointment, please call your pharmacy*  Lab Work: Fasting lipid panel, CMET 6-8 weeks  Testing/Procedures: Your physician has requested that you have an echocardiogram. Echocardiography is a painless test that uses sound waves to create images of your heart. It provides your doctor with information about the size and shape of your heart and how well your heart's chambers and valves are working. This procedure takes approximately one hour. There are no restrictions for this procedure. Please do NOT wear cologne, perfume, aftershave, or lotions (deodorant is allowed). Please arrive 15 minutes prior to your appointment time.  Please note: We ask at that you not bring children with you during ultrasound (echo/ vascular) testing. Due to room size and safety concerns, children are not allowed in the ultrasound rooms during exams. Our front office staff cannot provide observation of children in our lobby area while testing is being conducted. An adult accompanying a patient to their appointment will only be allowed in the ultrasound room at the discretion of the ultrasound technician under special circumstances. We apologize for any inconvenience.  Follow-Up: At [facility], you and your health needs are our priority.  As part of our continuing mission to provide you with exceptional heart care, our providers are all part of one team.  This team includes your primary Cardiologist (physician) and Advanced Practice Providers or APPs (Physician Assistants and Nurse Practitioners) who all work together to provide you with the care you need, when you need it.  Your next appointment:   2-3 month(s)  Provider:   Lynwood Schilling, MD or Damien Braver, NP          We recommend signing up for the patient portal called MyChart.  Sign up information is provided on this After  Visit Summary.  MyChart is used to connect with patients for Virtual Visits (Telemedicine).  Patients are able to view lab/test results, encounter notes, upcoming appointments, etc.  Non-urgent messages can be sent to your provider as well.   To learn more about what you can do with MyChart, go to [email or web address].   Other Instructions

## 2023-10-22 ENCOUNTER — Encounter: Payer: Self-pay | Admitting: Nurse Practitioner

## 2023-10-23 ENCOUNTER — Encounter (HOSPITAL_COMMUNITY): Payer: Self-pay

## 2023-10-24 ENCOUNTER — Telehealth (HOSPITAL_COMMUNITY): Payer: Self-pay | Admitting: Surgery

## 2023-10-24 ENCOUNTER — Encounter (HOSPITAL_COMMUNITY)

## 2023-10-24 DIAGNOSIS — Z955 Presence of coronary angioplasty implant and graft: Secondary | ICD-10-CM | POA: Insufficient documentation

## 2023-10-24 NOTE — Telephone Encounter (Signed)
 I called the pt so that I could complete her virtual phone appt before her orientation next week.  Left a voicemail and awaiting her call back

## 2023-10-31 ENCOUNTER — Encounter (HOSPITAL_COMMUNITY)
Admission: RE | Admit: 2023-10-31 | Discharge: 2023-10-31 | Disposition: A | Discharge status: Home / Self Care | Source: Ambulatory Visit | Attending: Cardiology | Admitting: Cardiology

## 2023-10-31 ENCOUNTER — Other Ambulatory Visit: Payer: Self-pay | Admitting: Family

## 2023-10-31 VITALS — Ht 60.5 in | Wt 158.0 lb

## 2023-10-31 DIAGNOSIS — Z955 Presence of coronary angioplasty implant and graft: Secondary | ICD-10-CM | POA: Diagnosis not present

## 2023-10-31 DIAGNOSIS — I10 Essential (primary) hypertension: Secondary | ICD-10-CM

## 2023-10-31 NOTE — Progress Notes (Signed)
 Cardiac Individual Treatment Plan  Patient Details  Name: [name] MRN: [id number] Date of Birth: 1945-10-03 Referring Provider:   Flowsheet Row CARDIAC REHAB PHASE II ORIENTATION from 10/31/2023 in [facility] CARDIAC REHABILITATION  Referring Provider Lavona Agent MD    Initial Encounter Date:  Flowsheet Row CARDIAC REHAB PHASE II ORIENTATION from 10/31/2023 in [place] CARDIAC REHABILITATION  Date 10/31/23    Visit Diagnosis: Status post coronary artery stent placement  Patient's Home Medications on Admission:  Current Outpatient Medications:    acetaminophen  (TYLENOL ) 500 MG tablet, Take 1,000 mg by mouth every 6 (six) hours as needed for moderate pain or headache., Disp: , Rfl:    albuterol  (PROVENTIL ) (2.5 MG/3ML) 0.083% nebulizer solution, Take 3 mLs (2.5 mg total) by nebulization every 6 (six) hours as needed for wheezing or shortness of breath., Disp: 150 mL, Rfl: 1   albuterol  (VENTOLIN  HFA) 108 (90 Base) MCG/ACT inhaler, Inhale 2 puffs into the lungs every 6 (six) hours as needed for wheezing or shortness of breath., Disp: 18 g, Rfl: 0   amLODipine  (NORVASC ) 10 MG tablet, Take 1 tablet (10 mg total) by mouth daily., Disp: 90 tablet, Rfl: 2   apixaban  (ELIQUIS ) 5 MG TABS tablet, Take 1 tablet (5 mg total) by mouth 2 (two) times daily., Disp: 60 tablet, Rfl: 6   aspirin  81 MG chewable tablet, Chew 1 tablet (81 mg total) by mouth daily., Disp: , Rfl:    bisoprolol  (ZEBETA ) 5 MG tablet, Take 1 tablet (5 mg total) by mouth daily., Disp: 90 tablet, Rfl: 3   clopidogrel  (PLAVIX ) 75 MG tablet, Take 1 tablet (75 mg total) by mouth daily with breakfast., Disp: 30 tablet, Rfl: 5   nitroGLYCERIN  (NITROSTAT ) 0.4 MG SL tablet, Place 1 tablet (0.4 mg total) under the tongue every 5 (five) minutes as needed for chest pain., Disp: 25 tablet, Rfl: 2   rosuvastatin  (CRESTOR ) 40 MG tablet, Take 1 tablet (40 mg total) by mouth daily., Disp: 30 tablet, Rfl: 11   sacubitril -valsartan   (ENTRESTO ) 97-103 MG, Take 1 tablet by mouth twice daily, Disp: 90 tablet, Rfl: 3   spironolactone  (ALDACTONE ) 25 MG tablet, Take 1 tablet (25 mg total) by mouth daily., Disp: 30 tablet, Rfl: 6   glucose blood (ONETOUCH VERIO) test strip, Use as instructed (Patient not taking: Reported on 10/31/2023), Disp: 100 each, Rfl: 12   ONETOUCH DELICA LANCETS FINE MISC, Use to check BG once daily (Patient not taking: Reported on 10/31/2023), Disp: 100 each, Rfl: 2  Past Medical History: Past Medical History:  Diagnosis Date   Asthma    Chronic systolic heart failure (HCC)    Echo (08/26/13):  Mild LVH. EF 25% to 30%. Diffuse HK. Aortic valve: There was trivial regurgitation. Mitral valve: There was mild regurgitation. Left atrium: The atrium was mildly dilated.   GERD (gastroesophageal reflux disease)    Headache(784.0)    History of kidney stones    History of nephrolithiasis    Hypertension    NICM (nonischemic cardiomyopathy) (HCC)    a. LHC (08/2013):  no CAD   Sleep apnea    does not use machine   Stroke (HCC)    Type II or unspecified type diabetes mellitus without mention of complication, uncontrolled    borderline    Tobacco Use: Social History   Tobacco Use  Smoking Status Never  Smokeless Tobacco Never    Labs: Review Flowsheet  More data exists      Latest Ref Rng & Units  02/07/2019 05/05/2021 08/08/2021 10/25/2021 11/16/2022  Labs for ITP Cardiac and Pulmonary Rehab  Cholestrol 100 - 199 mg/dL 796  791  - - 786   LDL (calc) 0 - 99 mg/dL 870  874  - - 861   HDL-C >39 mg/dL 40  40  - - 44   Trlycerides 0 - 149 mg/dL 806  759  - - 824   Hemoglobin A1c 4.8 - 5.6 % 8.0  7.1  6.9  7.1  7.5     Capillary Blood Glucose: Lab Results  Component Value Date   GLUCAP 302 (H) 10/04/2023   GLUCAP 148 (H) 10/04/2023   GLUCAP 135 (H) 10/27/2021   GLUCAP 135 (H) 10/06/2021   GLUCAP 140 (H) 10/06/2021     Exercise Target Goals: Exercise Program Goal: Individual exercise prescription  set using results from initial 6 min walk test and THRR while considering  patient's activity barriers and safety.   Exercise Prescription Goal: Starting with aerobic activity 30 plus minutes a day, 3 days per week for initial exercise prescription. Provide home exercise prescription and guidelines that participant acknowledges understanding prior to discharge.  Activity Barriers & Risk Stratification:  Activity Barriers & Cardiac Risk Stratification - 10/31/23 1414       Activity Barriers & Cardiac Risk Stratification   Activity Barriers History of Falls;Balance Concerns;Muscular Weakness;Deconditioning    Cardiac Risk Stratification Moderate          6 Minute Walk:  6 Minute Walk     Row Name 10/31/23 1507         6 Minute Walk   Phase Initial     Distance 895 feet     Walk Time 6 minutes     # of Rest Breaks 0     MPH 1.69     METS 1.38     RPE 11     Perceived Dyspnea  3     VO2 Peak 4.84     Symptoms Yes (comment)     Comments SOB, wearing flip flops     Resting HR 60 bpm     Resting BP 122/66     Resting Oxygen Saturation  98 %     Exercise Oxygen Saturation  during 6 min walk 99 %     Max Ex. HR 92 bpm     Max Ex. BP 126/74     2 Minute Post BP 106/70        Oxygen Initial Assessment:   Oxygen Re-Evaluation:   Oxygen Discharge (Final Oxygen Re-Evaluation):   Initial Exercise Prescription:  Initial Exercise Prescription - 10/31/23 1500       Date of Initial Exercise RX and Referring Provider   Date 10/31/23    Referring Provider Lavona Agent MD      Oxygen   Maintain Oxygen Saturation 88% or higher      Treadmill   MPH 1.5    Grade 0.5    Minutes 15    METs 2.25      NuStep   Level 1    SPM 80    Minutes 15    METs 2.2      Prescription Details   Frequency (times per week) 2    Duration Progress to 30 minutes of continuous aerobic without signs/symptoms of physical distress      Intensity   THRR 40-80% of Max Heartrate 93-126     Ratings of Perceived Exertion 11-13    Perceived Dyspnea 0-4  Progression   Progression Continue to progress workloads to maintain intensity without signs/symptoms of physical distress.      Resistance Training   Training Prescription Yes    Weight 3 lb    Reps 10-15          Perform Capillary Blood Glucose checks as needed.  Exercise Prescription Changes:   Exercise Prescription Changes     Row Name 10/31/23 1500             Response to Exercise   Blood Pressure (Admit) 122/60       Blood Pressure (Exercise) 126/74       Blood Pressure (Exit) 106/70       Heart Rate (Admit) 60 bpm       Heart Rate (Exercise) 92 bpm       Heart Rate (Exit) 70 bpm       Oxygen Saturation (Admit) 98 %       Oxygen Saturation (Exercise) 99 %       Rating of Perceived Exertion (Exercise) 11       Perceived Dyspnea (Exercise) 3       Symptoms SOB       Comments walk test results (in flip flops)          Exercise Comments:   Exercise Goals and Review:   Exercise Goals     Row Name 10/31/23 1510             Exercise Goals   Increase Physical Activity Yes       Intervention Provide advice, education, support and counseling about physical activity/exercise needs.;Develop an individualized exercise prescription for aerobic and resistive training based on initial evaluation findings, risk stratification, comorbidities and participant's personal goals.       Expected Outcomes Short Term: Attend rehab on a regular basis to increase amount of physical activity.;Long Term: Exercising regularly at least 3-5 days a week.;Long Term: Add in home exercise to make exercise part of routine and to increase amount of physical activity.       Increase Strength and Stamina Yes       Intervention Provide advice, education, support and counseling about physical activity/exercise needs.;Develop an individualized exercise prescription for aerobic and resistive training based on initial  evaluation findings, risk stratification, comorbidities and participant's personal goals.       Expected Outcomes Short Term: Increase workloads from initial exercise prescription for resistance, speed, and METs.;Short Term: Perform resistance training exercises routinely during rehab and add in resistance training at home;Long Term: Improve cardiorespiratory fitness, muscular endurance and strength as measured by increased METs and functional capacity ( )       Able to understand and use rate of perceived exertion (RPE) scale Yes       Intervention Provide education and explanation on how to use RPE scale       Expected Outcomes Short Term: Able to use RPE daily in rehab to express subjective intensity level;Long Term:  Able to use RPE to guide intensity level when exercising independently       Able to understand and use Dyspnea scale Yes       Intervention Provide education and explanation on how to use Dyspnea scale       Expected Outcomes Short Term: Able to use Dyspnea scale daily in rehab to express subjective sense of shortness of breath during exertion;Long Term: Able to use Dyspnea scale to guide intensity level when exercising independently       Knowledge and  understanding of Target Heart Rate Range (THRR) Yes       Intervention Provide education and explanation of THRR including how the numbers were predicted and where they are located for reference       Expected Outcomes Short Term: Able to state/look up THRR;Long Term: Able to use THRR to govern intensity when exercising independently;Short Term: Able to use daily as guideline for intensity in rehab       Able to check pulse independently Yes       Intervention Provide education and demonstration on how to check pulse in carotid and radial arteries.;Review the importance of being able to check your own pulse for safety during independent exercise       Expected Outcomes Short Term: Able to explain why pulse checking is important  during independent exercise;Long Term: Able to check pulse independently and accurately       Understanding of Exercise Prescription Yes       Intervention Provide education, explanation, and written materials on patient's individual exercise prescription       Expected Outcomes Short Term: Able to explain program exercise prescription;Long Term: Able to explain home exercise prescription to exercise independently          Exercise Goals Re-Evaluation :    Discharge Exercise Prescription (Final Exercise Prescription Changes):  Exercise Prescription Changes - 10/31/23 1500       Response to Exercise   Blood Pressure (Admit) 122/60    Blood Pressure (Exercise) 126/74    Blood Pressure (Exit) 106/70    Heart Rate (Admit) 60 bpm    Heart Rate (Exercise) 92 bpm    Heart Rate (Exit) 70 bpm    Oxygen Saturation (Admit) 98 %    Oxygen Saturation (Exercise) 99 %    Rating of Perceived Exertion (Exercise) 11    Perceived Dyspnea (Exercise) 3    Symptoms SOB    Comments walk test results (in flip flops)          Nutrition:  Target Goals: Understanding of nutrition guidelines, daily intake of sodium 1500mg , cholesterol 200mg , calories 30% from fat and 7% or less from saturated fats, daily to have 5 or more servings of fruits and vegetables.  Biometrics:  Pre Biometrics - 10/31/23 1510       Pre Biometrics   Height 5' 0.5 (1.537 m)    Weight 71.7 kg    Waist Circumference 35.25 inches    Hip Circumference 41.25 inches    Waist to Hip Ratio 0.85 %    BMI (Calculated) 30.34    Grip Strength 9.1 kg    Single Leg Stand 4 seconds           Nutrition Therapy Plan and Nutrition Goals:  Nutrition Therapy & Goals - 10/31/23 1515       Intervention Plan   Intervention Prescribe, educate and counsel regarding individualized specific dietary modifications aiming towards targeted core components such as weight, hypertension, lipid management, diabetes, heart failure and other  comorbidities.;Nutrition handout(s) given to patient.    Expected Outcomes Short Term Goal: Understand basic principles of dietary content, such as calories, fat, sodium, cholesterol and nutrients.;Long Term Goal: Adherence to prescribed nutrition plan.          Nutrition Assessments:  MEDIFICTS Score Key: >=70 Need to make dietary changes  40-70 Heart Healthy Diet <= 40 Therapeutic Level Cholesterol Diet  Flowsheet Row CARDIAC REHAB PHASE II ORIENTATION from 10/31/2023 in [facility] CARDIAC REHABILITATION  Picture Your Plate Total  Score on Admission 60   Picture Your Plate Scores: <59 Unhealthy dietary pattern with much room for improvement. 41-50 Dietary pattern unlikely to meet recommendations for good health and room for improvement. 51-60 More healthful dietary pattern, with some room for improvement.  >60 Healthy dietary pattern, although there may be some specific behaviors that could be improved.    Nutrition Goals Re-Evaluation:   Nutrition Goals Discharge (Final Nutrition Goals Re-Evaluation):   Psychosocial: Target Goals: Acknowledge presence or absence of significant depression and/or stress, maximize coping skills, provide positive support system. Participant is able to verbalize types and ability to use techniques and skills needed for reducing stress and depression.  Initial Review & Psychosocial Screening:  Initial Psych Review & Screening - 10/31/23 1418       Initial Review   Current issues with History of Depression;Current Stress Concerns    Source of Stress Concerns Unable to participate in former interests or hobbies;Unable to perform yard/household activities;Financial    Comments In process of moving.  Was living with daughter in [place], moving to [place] to be closer to work      [employer]   Good Support System? Yes   Daughter, friends from church     Barriers   Psychosocial barriers to participate in program Psychosocial barriers  identified (see note);The patient should benefit from training in stress management and relaxation.      Screening Interventions   Interventions Encouraged to exercise;To provide support and resources with identified psychosocial needs;Provide feedback about the scores to participant    Expected Outcomes Short Term goal: Utilizing psychosocial counselor, staff and physician to assist with identification of specific Stressors or current issues interfering with healing process. Setting desired goal for each stressor or current issue identified.;Long Term Goal: Stressors or current issues are controlled or eliminated.;Short Term goal: Identification and review with participant of any Quality of Life or Depression concerns found by scoring the questionnaire.;Long Term goal: The participant improves quality of Life and PHQ9 Scores as seen by post scores and/or verbalization of changes          Quality of Life Scores:  Quality of Life - 10/31/23 1514       Quality of Life   Select Quality of Life      Quality of Life Scores   Health/Function Pre 22.17 %    Socioeconomic Pre 19 %    Psych/Spiritual Pre 22.71 %    Family Pre 25.8 %    GLOBAL Pre 22.07 %         Scores of 19 and below usually indicate a poorer quality of life in these areas.  A difference of  2-3 points is a clinically meaningful difference.  A difference of 2-3 points in the total score of the Quality of Life Index has been associated with significant improvement in overall quality of life, self-image, physical symptoms, and general health in studies assessing change in quality of life.  PHQ-9: Review Flowsheet  More data exists      10/31/2023 11/16/2022 09/19/2022 09/28/2021 06/20/2021  Depression screen PHQ 2/9  Decreased Interest 1 0 0 1 1  Down, Depressed, Hopeless 1 0 0 1 1  PHQ - 2 Score 2 0 0 2 2  Altered sleeping 0 1 - 0 2  Tired, decreased energy 3 1 - 1 2  Change in appetite 0 0 - 1 0  Feeling bad or failure  about yourself  1 0 - 0 0  Trouble concentrating 1  0 - 1 1  Moving slowly or fidgety/restless 0 0 - 0 0  Suicidal thoughts 0 0 - 0 0  PHQ-9 Score 7 2 - 5 7  Difficult doing work/chores Somewhat difficult Not difficult at all - Somewhat difficult Somewhat difficult   Interpretation of Total Score  Total Score Depression Severity:  1-4 = Minimal depression, 5-9 = Mild depression, 10-14 = Moderate depression, 15-19 = Moderately severe depression, 20-27 = Severe depression   Psychosocial Evaluation and Intervention:  Psychosocial Evaluation - 10/31/23 1511       Psychosocial Evaluation & Interventions   Comments Chassity is coming into cardiac rehab after having a stent placed in June.  She is also in the middle of moving from [place] to [place] to be closer to work.  She was living with her daughter (her biggest supporter) but wanted to move out to be independent and close to work since she goes in 4 days a week. 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  She is eager to build her strength and stamina back up. She wants to get into a routine for herself again.  She usually sleeps well.  She does not precieve any issues with getting to rehab reguarly.  She does have a copay and will let us  know if it gets to be too much. She has a history of meds but is no longer needing them.  She is also off of her type 2 DM meds which she is very proud of herself.    Expected Outcomes Short: Attend rehab to build up stamina. Long: Continue to improve strength    Continue Psychosocial Services  Follow up required by staff          Psychosocial Re-Evaluation:   Psychosocial Discharge (Final Psychosocial Re-Evaluation):   Vocational Rehabilitation: Provide vocational rehab assistance to qualifying candidates.   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 Participant will state understanding/return demonstration of topics presented.  Learning Barriers/Preferences:  Learning Barriers/Preferences - 10/31/23 1416       Learning Barriers/Preferences   Learning Barriers None    Learning Preferences None          Education Topics: Hypertension, Hypertension Reduction -Define heart disease and high blood pressure. Discus how high blood pressure affects the body and ways to reduce high blood pressure.   Exercise and Your Heart -Discuss why it is important to exercise, the FITT principles of exercise, normal and abnormal responses to exercise, and how to exercise safely.   Angina -Discuss definition of angina, causes of angina, treatment of angina, and how to decrease risk of having angina.   Cardiac Medications -Review what the following cardiac medications are used for, how they affect the body, and side effects that may occur when taking the medications.  Medications include Aspirin , Beta blockers, calcium  channel blockers, ACE Inhibitors, angiotensin receptor blockers, diuretics, digoxin, and antihyperlipidemics.   Congestive Heart Failure -Discuss the definition of CHF, how to live with CHF, the signs and symptoms of CHF, and how keep track of weight and sodium intake.   Heart Disease and Intimacy -Discus the effect sexual activity has on the heart,  how changes occur during intimacy as we age, and safety during sexual activity.   Smoking Cessation / COPD -Discuss different methods to quit smoking, the health benefits of quitting smoking, and the definition of COPD.   Nutrition I: Fats -Discuss the types of cholesterol, what cholesterol does to the heart, and how cholesterol levels can be controlled.   Nutrition II: Labels -Discuss the  different components of food labels and how to read food label   Heart Parts/Heart Disease and PAD -Discuss the anatomy of the heart, the pathway of blood circulation through the heart, and these are affected by heart disease.   Stress I: Signs and Symptoms -Discuss the causes of stress, how stress may lead to anxiety and depression, and ways to limit stress.   Stress II: Relaxation -Discuss different types of relaxation techniques to limit stress.   Warning Signs of Stroke / TIA -Discuss definition of a stroke, what the signs and symptoms are of a stroke, and how to identify when someone is having stroke.   Knowledge Questionnaire Score:  Knowledge Questionnaire Score - 10/31/23 1515       Knowledge Questionnaire Score   Pre Score 22/26          Core Components/Risk Factors/Patient Goals at Admission:  Personal Goals and Risk Factors at Admission - 10/31/23 1515       Core Components/Risk Factors/Patient Goals on Admission    Weight Management Yes;Weight Loss;Obesity    Intervention Weight Management: Develop a combined nutrition and exercise program designed to reach desired caloric intake, while maintaining appropriate intake of nutrient and fiber, sodium and fats, and appropriate energy expenditure required for the weight goal.;Weight Management: Provide education and appropriate resources to help participant work on and attain dietary goals.;Weight Management/Obesity: Establish reasonable short term and long term weight goals.;Obesity: Provide education and appropriate resources to help participant work on and attain dietary goals.    Admit Weight 158 lb (71.7 kg)    Goal Weight: Short Term 155 lb (70.3 kg)    Goal Weight: Long Term 150 lb (68 kg)    Expected Outcomes Short Term: Continue to assess and modify interventions until short term weight is achieved;Long Term: Adherence to nutrition and physical activity/exercise program aimed toward attainment of established  weight goal;Weight Loss: Understanding of general recommendations for a balanced deficit meal plan, which promotes 1-2 lb weight loss per week and includes a negative energy balance of [id number] kcal/d;Understanding recommendations for meals to include 15-35% energy as protein, 25-35% energy from fat, 35-60% energy from carbohydrates, less than 200mg  of dietary cholesterol, 20-35 gm of total fiber daily;Understanding of distribution of calorie intake throughout the day with the consumption of 4-5 meals/snacks    Improve shortness of breath with ADL's Yes    Intervention Provide education, individualized exercise plan and daily activity instruction to help decrease symptoms of SOB with activities of daily living.    Expected Outcomes Short Term: Improve cardiorespiratory fitness to achieve a reduction of symptoms when performing ADLs;Long Term: Be able to perform more ADLs without symptoms or delay the onset of symptoms    Diabetes Yes   no longer on meds or checking sugars   Intervention Provide education about signs/symptoms and action to take for hypo/hyperglycemia.;Provide education about proper nutrition, including hydration, and aerobic/resistive exercise prescription along with prescribed medications to achieve blood glucose in normal ranges: Fasting glucose 65-99 mg/dL    Expected Outcomes Short Term: Participant verbalizes understanding of the signs/symptoms and immediate care of hyper/hypoglycemia,  proper foot care and importance of medication, aerobic/resistive exercise and nutrition plan for blood glucose control.    Heart Failure Yes    Intervention Provide a combined exercise and nutrition program that is supplemented with education, support and counseling about heart failure. Directed toward relieving symptoms such as shortness of breath, decreased exercise tolerance, and extremity edema.    Expected Outcomes Improve functional capacity of life;Short term: Attendance in program 2-3 days a week  with increased exercise capacity. Reported lower sodium intake. Reported increased fruit and vegetable intake. Reports medication compliance.;Short term: Daily weights obtained and reported for increase. Utilizing diuretic protocols set by physician.;Long term: Adoption of self-care skills and reduction of barriers for early signs and symptoms recognition and intervention leading to self-care maintenance.    Hypertension Yes    Intervention Provide education on lifestyle modifcations including regular physical activity/exercise, weight management, moderate sodium restriction and increased consumption of fresh fruit, vegetables, and low fat dairy, alcohol  moderation, and smoking cessation.;Monitor prescription use compliance.    Expected Outcomes Short Term: Continued assessment and intervention until BP is < 140/73mm HG in hypertensive participants. < 130/52mm HG in hypertensive participants with diabetes, heart failure or chronic kidney disease.;Long Term: Maintenance of blood pressure at goal levels.    Lipids Yes    Intervention Provide education and support for participant on nutrition & aerobic/resistive exercise along with prescribed medications to achieve LDL 70mg , HDL >40mg .    Expected Outcomes Short Term: Participant states understanding of desired cholesterol values and is compliant with medications prescribed. Participant is following exercise prescription and nutrition guidelines.;Long Term: Cholesterol controlled with medications as prescribed, with individualized exercise RX and with personalized nutrition plan. Value goals: LDL < 70mg , HDL > 40 mg.          Core Components/Risk Factors/Patient Goals Review:    Core Components/Risk Factors/Patient Goals at Discharge (Final Review):    ITP Comments:  ITP Comments     Row Name 10/31/23 1503           ITP Comments Patient attend orientation today.  Patient is attending Cardiac Rehabilitation Program.  Documentation for  diagnosis can be found in 10/05/23.  Reviewed medical chart, RPE/RPD, gym safety, and program guidelines.  Patient was fitted to equipment they will be using during rehab.  Patient is scheduled to start exercise on Tuesday 11/06/23 at 1330.   Initial ITP created and sent for review and signature by Dr. Dorn Ross, Medical Director for Cardiac Rehabilitation Program.          Comments: Initial ITP

## 2023-10-31 NOTE — Patient Instructions (Signed)
 Patient Instructions  Patient Details  Name: [name] MRN: [id number] Date of Birth: Nov 15, 1945 Referring Provider:  Lavell Bari LABOR, FNP  Below are your personal goals for exercise, nutrition, and risk factors. Our goal is to help you stay on track towards obtaining and maintaining these goals. We will be discussing your progress on these goals with you throughout the program.  Initial Exercise Prescription:  Initial Exercise Prescription - 10/31/23 1500       Date of Initial Exercise RX and Referring Provider   Date 10/31/23    Referring Provider Lavona Agent MD      Oxygen   Maintain Oxygen Saturation 88% or higher      Treadmill   MPH 1.5    Grade 0.5    Minutes 15    METs 2.25      NuStep   Level 1    SPM 80    Minutes 15    METs 2.2      Prescription Details   Frequency (times per week) 2    Duration Progress to 30 minutes of continuous aerobic without signs/symptoms of physical distress      Intensity   THRR 40-80% of Max Heartrate 93-126    Ratings of Perceived Exertion 11-13    Perceived Dyspnea 0-4      Progression   Progression Continue to progress workloads to maintain intensity without signs/symptoms of physical distress.      Resistance Training   Training Prescription Yes    Weight 3 lb    Reps 10-15          Exercise Goals: Frequency: Be able to perform aerobic exercise two to three times per week in program working toward 2-5 days per week of home exercise.  Intensity: Work with a perceived exertion of 11 (fairly light) - 15 (hard) while following your exercise prescription.  We will make changes to your prescription with you as you progress through the program.   Duration: Be able to do 30 to 45 minutes of continuous aerobic exercise in addition to a 5 minute warm-up and a 5 minute cool-down routine.   Nutrition Goals: Your personal nutrition goals will be established when you do your nutrition analysis with the  dietician.  The following are general nutrition guidelines to follow: Cholesterol < 200mg /day Sodium < 1500mg /day Fiber: Women over 50 yrs - 21 grams per day  Personal Goals:  Personal Goals and Risk Factors at Admission - 10/31/23 1515       Core Components/Risk Factors/Patient Goals on Admission    Weight Management Yes;Weight Loss;Obesity    Intervention Weight Management: Develop a combined nutrition and exercise program designed to reach desired caloric intake, while maintaining appropriate intake of nutrient and fiber, sodium and fats, and appropriate energy expenditure required for the weight goal.;Weight Management: Provide education and appropriate resources to help participant work on and attain dietary goals.;Weight Management/Obesity: Establish reasonable short term and long term weight goals.;Obesity: Provide education and appropriate resources to help participant work on and attain dietary goals.    Admit Weight 158 lb (71.7 kg)    Goal Weight: Short Term 155 lb (70.3 kg)    Goal Weight: Long Term 150 lb (68 kg)    Expected Outcomes Short Term: Continue to assess and modify interventions until short term weight is achieved;Long Term: Adherence to nutrition and physical activity/exercise program aimed toward attainment of established weight goal;Weight Loss: Understanding of general recommendations for a balanced deficit meal plan, which promotes  1-2 lb weight loss per week and includes a negative energy balance of [phone] kcal/d;Understanding recommendations for meals to include 15-35% energy as protein, 25-35% energy from fat, 35-60% energy from carbohydrates, less than 200mg  of dietary cholesterol, 20-35 gm of total fiber daily;Understanding of distribution of calorie intake throughout the day with the consumption of 4-5 meals/snacks    Improve shortness of breath with ADL's Yes    Intervention Provide education, individualized exercise plan and daily activity instruction to help  decrease symptoms of SOB with activities of daily living.    Expected Outcomes Short Term: Improve cardiorespiratory fitness to achieve a reduction of symptoms when performing ADLs;Long Term: Be able to perform more ADLs without symptoms or delay the onset of symptoms    Diabetes Yes   no longer on meds or checking sugars   Intervention Provide education about signs/symptoms and action to take for hypo/hyperglycemia.;Provide education about proper nutrition, including hydration, and aerobic/resistive exercise prescription along with prescribed medications to achieve blood glucose in normal ranges: Fasting glucose 65-99 mg/dL    Expected Outcomes Short Term: Participant verbalizes understanding of the signs/symptoms and immediate care of hyper/hypoglycemia, proper foot care and importance of medication, aerobic/resistive exercise and nutrition plan for blood glucose control.    Heart Failure Yes    Intervention Provide a combined exercise and nutrition program that is supplemented with education, support and counseling about heart failure. Directed toward relieving symptoms such as shortness of breath, decreased exercise tolerance, and extremity edema.    Expected Outcomes Improve functional capacity of life;Short term: Attendance in program 2-3 days a week with increased exercise capacity. Reported lower sodium intake. Reported increased fruit and vegetable intake. Reports medication compliance.;Short term: Daily weights obtained and reported for increase. Utilizing diuretic protocols set by physician.;Long term: Adoption of self-care skills and reduction of barriers for early signs and symptoms recognition and intervention leading to self-care maintenance.    Hypertension Yes    Intervention Provide education on lifestyle modifcations including regular physical activity/exercise, weight management, moderate sodium restriction and increased consumption of fresh fruit, vegetables, and low fat dairy, alcohol   moderation, and smoking cessation.;Monitor prescription use compliance.    Expected Outcomes Short Term: Continued assessment and intervention until BP is < 140/55mm HG in hypertensive participants. < 130/77mm HG in hypertensive participants with diabetes, heart failure or chronic kidney disease.;Long Term: Maintenance of blood pressure at goal levels.    Lipids Yes    Intervention Provide education and support for participant on nutrition & aerobic/resistive exercise along with prescribed medications to achieve LDL 70mg , HDL >40mg .    Expected Outcomes Short Term: Participant states understanding of desired cholesterol values and is compliant with medications prescribed. Participant is following exercise prescription and nutrition guidelines.;Long Term: Cholesterol controlled with medications as prescribed, with individualized exercise RX and with personalized nutrition plan. Value goals: LDL < 70mg , HDL > 40 mg.          Tobacco Use Initial Evaluation: Social History   Tobacco Use  Smoking Status Never  Smokeless Tobacco Never    Exercise Goals and Review:  Exercise Goals     Row Name 10/31/23 1510             Exercise Goals   Increase Physical Activity Yes       Intervention Provide advice, education, support and counseling about physical activity/exercise needs.;Develop an individualized exercise prescription for aerobic and resistive training based on initial evaluation findings, risk stratification, comorbidities and participant's personal goals.  Expected Outcomes Short Term: Attend rehab on a regular basis to increase amount of physical activity.;Long Term: Exercising regularly at least 3-5 days a week.;Long Term: Add in home exercise to make exercise part of routine and to increase amount of physical activity.       Increase Strength and Stamina Yes       Intervention Provide advice, education, support and counseling about physical activity/exercise needs.;Develop an  individualized exercise prescription for aerobic and resistive training based on initial evaluation findings, risk stratification, comorbidities and participant's personal goals.       Expected Outcomes Short Term: Increase workloads from initial exercise prescription for resistance, speed, and METs.;Short Term: Perform resistance training exercises routinely during rehab and add in resistance training at home;Long Term: Improve cardiorespiratory fitness, muscular endurance and strength as measured by increased METs and functional capacity ( )       Able to understand and use rate of perceived exertion (RPE) scale Yes       Intervention Provide education and explanation on how to use RPE scale       Expected Outcomes Short Term: Able to use RPE daily in rehab to express subjective intensity level;Long Term:  Able to use RPE to guide intensity level when exercising independently       Able to understand and use Dyspnea scale Yes       Intervention Provide education and explanation on how to use Dyspnea scale       Expected Outcomes Short Term: Able to use Dyspnea scale daily in rehab to express subjective sense of shortness of breath during exertion;Long Term: Able to use Dyspnea scale to guide intensity level when exercising independently       Knowledge and understanding of Target Heart Rate Range (THRR) Yes       Intervention Provide education and explanation of THRR including how the numbers were predicted and where they are located for reference       Expected Outcomes Short Term: Able to state/look up THRR;Long Term: Able to use THRR to govern intensity when exercising independently;Short Term: Able to use daily as guideline for intensity in rehab       Able to check pulse independently Yes       Intervention Provide education and demonstration on how to check pulse in carotid and radial arteries.;Review the importance of being able to check your own pulse for safety during independent exercise        Expected Outcomes Short Term: Able to explain why pulse checking is important during independent exercise;Long Term: Able to check pulse independently and accurately       Understanding of Exercise Prescription Yes       Intervention Provide education, explanation, and written materials on patient's individual exercise prescription       Expected Outcomes Short Term: Able to explain program exercise prescription;Long Term: Able to explain home exercise prescription to exercise independently        Copy of goals given to participant.

## 2023-11-06 ENCOUNTER — Encounter (HOSPITAL_COMMUNITY)
Admission: RE | Admit: 2023-11-06 | Discharge: 2023-11-06 | Disposition: A | Discharge status: Home / Self Care | Source: Ambulatory Visit | Attending: Cardiology | Admitting: Cardiology

## 2023-11-06 ENCOUNTER — Ambulatory Visit (HOSPITAL_COMMUNITY)

## 2023-11-06 DIAGNOSIS — Z955 Presence of coronary angioplasty implant and graft: Secondary | ICD-10-CM

## 2023-11-06 NOTE — Progress Notes (Signed)
 Daily Session Note  Patient Details  Name: [name] MRN: [id number] Date of Birth: 10/04/1945 Referring Provider:   Flowsheet Row CARDIAC REHAB PHASE II ORIENTATION from 10/31/2023 in [facility] C[facility]AC RE[facility]  Referring Provider Lavona Agent MD    Encounter Date: 11/06/2023  Check In:  Session Check In - 11/06/23 1336       Check-In   Supervising physician immediately available to respond to emergencies See telemetry face sheet for immediately available MD    Location [facility]    Staff Present Laymon Rattler, BSN, RN, Rosalba Gelineau, MA, RCEP, CCRP, CCET    Virtual Visit No    Medication changes reported     No    Fall or balance concerns reported    No    Tobacco Cessation No Change    Warm-up and Cool-down Performed on first and last piece of equipment    Resistance Training Performed Yes    VAD Patient? No    PAD/SET Patient? No      Pain Assessment   Currently in Pain? No/denies          Capillary Blood Glucose: No results found for this or any previous visit (from the past 24 hours).    Social History   Tobacco Use  Smoking Status Never  Smokeless Tobacco Never    Goals Met:  Independence with exercise equipment Exercise tolerated well No report of concerns or symptoms today Strength training completed today  Goals Unmet:  Not Applicable  Comments: First full day of exercise!  Patient was oriented to gym and equipment including functions, settings, policies, and procedures.  Patient's individual exercise prescription and treatment plan were reviewed.  All starting workloads were established based on the results of the 6 minute walk test done at initial orientation visit.  The plan for exercise progression was also introduced and progression will be customized based on patient's performance and goals.

## 2023-11-08 ENCOUNTER — Encounter (HOSPITAL_COMMUNITY)
Admission: RE | Admit: 2023-11-08 | Discharge: 2023-11-08 | Disposition: A | Discharge status: Home / Self Care | Source: Ambulatory Visit | Attending: Cardiology | Admitting: Cardiology

## 2023-11-08 DIAGNOSIS — Z955 Presence of coronary angioplasty implant and graft: Secondary | ICD-10-CM | POA: Diagnosis not present

## 2023-11-08 NOTE — Progress Notes (Signed)
 Daily Session Note  Patient Details  Name: [name] MRN: [id number] Date of Birth: 1946-02-22 Referring Provider:   Flowsheet Row CARDIAC REHAB PHASE II ORIENTATION from 10/31/2023 in [facility] CARDIAC REHABILITATION  Referring Provider Lavona Agent MD    Encounter Date: 11/08/2023  Check In:  Session Check In - 11/08/23 1315       Check-In   Supervising physician immediately available to respond to emergencies See telemetry face sheet for immediately available MD    Location [facility]    Staff Present Adrien Louder, RN, BSN;Jessica Grenville, MA, RCEP, CCRP, CCET;Mary Idell Glen, RN, BSN, MA    Virtual Visit No    Medication changes reported     No    Fall or balance concerns reported    No    Warm-up and Cool-down Performed on first and last piece of equipment    Resistance Training Performed Yes    VAD Patient? No    PAD/SET Patient? No      Pain Assessment   Currently in Pain? No/denies    Multiple Pain Sites No          Capillary Blood Glucose: No results found for this or any previous visit (from the past 24 hours).    Social History   Tobacco Use  Smoking Status Never  Smokeless Tobacco Never    Goals Met:  Independence with exercise equipment Exercise tolerated well No report of concerns or symptoms today Strength training completed today  Goals Unmet:  Not Applicable  Comments: Pt able to follow exercise prescription today without complaint.  Will continue to monitor for progression.

## 2023-11-09 ENCOUNTER — Ambulatory Visit (HOSPITAL_COMMUNITY)
Admission: RE | Admit: 2023-11-09 | Discharge: 2023-11-09 | Disposition: A | Discharge status: Home / Self Care | Source: Ambulatory Visit | Attending: Nurse Practitioner | Admitting: Nurse Practitioner

## 2023-11-09 DIAGNOSIS — I2510 Atherosclerotic heart disease of native coronary artery without angina pectoris: Secondary | ICD-10-CM | POA: Insufficient documentation

## 2023-11-09 DIAGNOSIS — I428 Other cardiomyopathies: Secondary | ICD-10-CM | POA: Diagnosis not present

## 2023-11-09 DIAGNOSIS — I340 Nonrheumatic mitral (valve) insufficiency: Secondary | ICD-10-CM | POA: Diagnosis not present

## 2023-11-09 DIAGNOSIS — R011 Cardiac murmur, unspecified: Secondary | ICD-10-CM | POA: Diagnosis not present

## 2023-11-09 LAB — ECHOCARDIOGRAM COMPLETE
AR max vel: 1.59 cm2
AV Area VTI: 1.46 cm2
AV Area mean vel: 1.65 cm2
AV Mean grad: 6 mmHg
AV Peak grad: 11 mmHg
Ao pk vel: 1.66 m/s
Area-P 1/2: 3.27 cm2
MV M vel: 3.38 m/s
MV Peak grad: 45.7 mmHg
S' Lateral: 3.1 cm

## 2023-11-09 NOTE — Progress Notes (Signed)
*  PRELIMINARY RESULTS* Echocardiogram 2D Echocardiogram has been performed.  [name] Stallion 11/09/2023, 9:43 AM

## 2023-11-10 ENCOUNTER — Other Ambulatory Visit: Payer: Self-pay | Admitting: Cardiology

## 2023-11-11 ENCOUNTER — Ambulatory Visit: Payer: Self-pay | Admitting: Nurse Practitioner

## 2023-11-13 ENCOUNTER — Encounter (HOSPITAL_COMMUNITY)

## 2023-11-14 ENCOUNTER — Encounter: Payer: Self-pay | Admitting: Nurse Practitioner

## 2023-11-14 ENCOUNTER — Telehealth: Payer: Self-pay | Admitting: Cardiology

## 2023-11-14 ENCOUNTER — Ambulatory Visit: Admitting: Nurse Practitioner

## 2023-11-14 VITALS — BP 165/70 | HR 54 | Temp 97.3°F | Ht 60.5 in | Wt 156.8 lb

## 2023-11-14 DIAGNOSIS — I209 Angina pectoris, unspecified: Secondary | ICD-10-CM | POA: Diagnosis not present

## 2023-11-14 DIAGNOSIS — H00011 Hordeolum externum right upper eyelid: Secondary | ICD-10-CM | POA: Diagnosis not present

## 2023-11-14 DIAGNOSIS — Z09 Encounter for follow-up examination after completed treatment for conditions other than malignant neoplasm: Secondary | ICD-10-CM | POA: Insufficient documentation

## 2023-11-14 DIAGNOSIS — I824Y9 Acute embolism and thrombosis of unspecified deep veins of unspecified proximal lower extremity: Secondary | ICD-10-CM

## 2023-11-14 MED ORDER — SACUBITRIL-VALSARTAN 97-103 MG PO TABS: 1.0000 | ORAL_TABLET | Freq: Two times a day (BID) | ORAL | 11 refills | Status: AC

## 2023-11-14 MED ORDER — SACUBITRIL-VALSARTAN 97-103 MG PO TABS: 1.0000 | ORAL_TABLET | Freq: Two times a day (BID) | ORAL | 3 refills | Status: DC

## 2023-11-14 MED ORDER — ERYTHROMYCIN 5 MG/GM OP OINT: 1.0000 | TOPICAL_OINTMENT | Freq: Every day | OPHTHALMIC | 0 refills | Status: AC

## 2023-11-14 NOTE — Telephone Encounter (Signed)
 Resent

## 2023-11-14 NOTE — Telephone Encounter (Signed)
 Pt called back in stating she is unable to afford 90 days. She asked if 30 days can't be sent instead.

## 2023-11-14 NOTE — Telephone Encounter (Signed)
 Rx sent in

## 2023-11-14 NOTE — Telephone Encounter (Signed)
*  STAT* If patient is at the pharmacy, call can be transferred to refill team.   1. Which medications need to be refilled? (please list name of each medication and dose if known)   sacubitril -valsartan  (ENTRESTO ) 97-103 MG    4. Which pharmacy/location (including street and city if local pharmacy) is medication to be sent to?  [id number] - [place], [place] - [address]     5. Do they need a 30 day or 90 day supply? 90    Pt states she is completely out

## 2023-11-14 NOTE — Progress Notes (Signed)
 Established Patient Office Visit  Subjective  Patient ID: [name], female    DOB: 1945-06-20  Age: 78 y.o. MRN: [id number]  Chief Complaint  Patient presents with   Hospitalization Follow-up    Went to hospital 3 weeks ago for heart stents     HPI [name] is a 78 year old female presenting on 11/14/2023 for a hospital follow-up visit after undergoing a heart catheterization on 10/04/2023. She had a follow-up with cardiology on 10/19/2023 and is currently participating in cardiac rehab twice weekly (Tuesdays and Thursdays). She missed yesterday's session.  The patient reports experiencing intermittent chest pain throughout the day, described as a dull ache rated 5/10 in severity. She states, "It's not that severe, but I know it's there." The chest pain occurs even at rest and has been noted to radiate from the chest to her right wrist, which was the access site for the procedure. Per her daughter, the patient was complaining of chest pain last night but refused to go to the emergency department.  She reports difficulty affording her prescribed Entresto , stating, "They want me to pay $500."  Additionally, she has a stye on her right eye and is requesting an antibiotic.  Patient Active Problem List   Diagnosis Date Noted   Hospital discharge follow-up 11/14/2023   Heart failure with recovered ejection fraction (HFrecEF) (HCC) 10/05/2023   DVT (deep venous thrombosis) (HCC) 10/05/2023   Angina pectoris (HCC) 10/04/2023   Aortic atherosclerosis (HCC) 07/31/2022   Deep vein thrombosis (DVT) of proximal lower extremity (HCC) 07/31/2022   Leg swelling 03/15/2022   Prolonged QT interval 03/11/2019   Hyperlipidemia associated with type 2 diabetes mellitus (HCC) 02/07/2019   Snoring 02/06/2018   Vitamin D  deficiency 07/30/2014   Osteopenia with high risk of fracture 07/30/2014   Left-sided Bell's palsy 01/16/2014   Late effect of cerebrovascular accident (CVA) 12/16/2013    NICM (nonischemic cardiomyopathy), Echo 08/26/13-EF 25-30%  09/17/2013   Type 2 diabetes mellitus with other specified complication (HCC) 05/05/2010   Essential hypertension 11/12/2007   Asthma 11/12/2007   GERD 11/12/2007   NEPHROLITHIASIS, HX OF 11/12/2007   Past Medical History:  Diagnosis Date   Asthma    Chronic systolic heart failure (HCC)    Echo (08/26/13):  Mild LVH. EF 25% to 30%. Diffuse HK. Aortic valve: There was trivial regurgitation. Mitral valve: There was mild regurgitation. Left atrium: The atrium was mildly dilated.   GERD (gastroesophageal reflux disease)    Headache(784.0)    History of kidney stones    History of nephrolithiasis    Hypertension    NICM (nonischemic cardiomyopathy) (HCC)    a. LHC (08/2013):  no CAD   Sleep apnea    does not use machine   Stroke (HCC)    Type II or unspecified type diabetes mellitus without mention of complication, uncontrolled    borderline   Past Surgical History:  Procedure Laterality Date   ABDOMINAL HYSTERECTOMY     CATARACT EXTRACTION     COLONOSCOPY WITH PROPOFOL  N/A 07/01/2019   Procedure: COLONOSCOPY WITH PROPOFOL ;  Surgeon: Harvey Margo CROME, MD;  [facility];  Service: Endoscopy;  Laterality: N/A;  2:15pm - pt will not move up   CORONARY STENT INTERVENTION N/A 10/04/2023   Procedure: CORONARY STENT INTERVENTION;  Surgeon: Swaziland, Peter M, MD;  Location: [facility] INVASIVE CV LAB;  Service: Cardiovascular;  Laterality: N/A;   CYSTOSCOPY WITH RETROGRADE PYELOGRAM, URETEROSCOPY AND STENT PLACEMENT Left 08/11/2021   Procedure: CYSTOSCOPY  WITH RETROGRADE PYELOGRAM, URETEROSCOPY AND STENT PLACEMENT;  Surgeon: Sherrilee Belvie CROME, MD;  Location: [facility];  Service: Urology;  Laterality: Left;   CYSTOSCOPY WITH RETROGRADE PYELOGRAM, URETEROSCOPY AND STENT PLACEMENT Left 09/08/2021   Procedure: CYSTOSCOPY WITH RETROGRADE PYELOGRAM, URETEROSCOPY AND STENT EXCHANGE;  Surgeon: Sherrilee Belvie CROME, MD;  Location: [facility];  Service:  Urology;  Laterality: Left;   CYSTOSCOPY WITH RETROGRADE PYELOGRAM, URETEROSCOPY AND STENT PLACEMENT Right 10/06/2021   Procedure: CYSTOSCOPY WITH RETROGRADE PYELOGRAM, URETEROSCOPY AND STENT PLACEMENT;  Surgeon: Sherrilee Belvie CROME, MD;  Location: [facility];  Service: Urology;  Laterality: Right;   CYSTOSCOPY WITH RETROGRADE PYELOGRAM, URETEROSCOPY AND STENT PLACEMENT Right 10/27/2021   Procedure: CYSTOSCOPY WITH RETROGRADE PYELOGRAM, URETEROSCOPY AND STENT EXCHANGE;  Surgeon: Sherrilee Belvie CROME, MD;  Location: [facility];  Service: Urology;  Laterality: Right;   EXTRACORPOREAL SHOCK WAVE LITHOTRIPSY Left 05/31/2021   Procedure: EXTRACORPOREAL SHOCK WAVE LITHOTRIPSY (ESWL);  Surgeon: Sherrilee Belvie CROME, MD;  Location: [facility];  Service: Urology;  Laterality: Left;   EXTRACORPOREAL SHOCK WAVE LITHOTRIPSY Left 06/28/2021   Procedure: EXTRACORPOREAL SHOCK WAVE LITHOTRIPSY (ESWL);  Surgeon: Sherrilee Belvie CROME, MD;  Location: [facility];  Service: Urology;  Laterality: Left;   HOLMIUM LASER APPLICATION Left 08/11/2021   Procedure: HOLMIUM LASER APPLICATION;  Surgeon: Sherrilee Belvie CROME, MD;  Location: [facility];  Service: Urology;  Laterality: Left;   HOLMIUM LASER APPLICATION Left 09/08/2021   Procedure: HOLMIUM LASER APPLICATION;  Surgeon: Sherrilee Belvie CROME, MD;  Location: [facility];  Service: Urology;  Laterality: Left;   HOLMIUM LASER APPLICATION Right 10/06/2021   Procedure: HOLMIUM LASER APPLICATION;  Surgeon: Sherrilee Belvie CROME, MD;  Location: [facility];  Service: Urology;  Laterality: Right;   HOLMIUM LASER APPLICATION Right 10/27/2021   Procedure: HOLMIUM LASER APPLICATION;  Surgeon: Sherrilee Belvie CROME, MD;  Location: [facility];  Service: Urology;  Laterality: Right;   LEFT AND RIGHT HEART CATHETERIZATION WITH CORONARY ANGIOGRAM N/A 09/03/2013   Procedure: LEFT AND RIGHT HEART CATHETERIZATION WITH CORONARY ANGIOGRAM;  Surgeon: Lonni JONETTA Cash, MD;  Location: [facility] CATH LAB;  Service: Cardiovascular;   Laterality: N/A;   LEFT HEART CATH AND CORONARY ANGIOGRAPHY N/A 10/04/2023   Procedure: LEFT HEART CATH AND CORONARY ANGIOGRAPHY;  Surgeon: Swaziland, Peter M, MD;  Location: [facility] INVASIVE CV LAB;  Service: Cardiovascular;  Laterality: N/A;   OOPHORECTOMY     removed 1 ovary and 1 ovary remains.   POLYPECTOMY  07/01/2019   Procedure: POLYPECTOMY;  Surgeon: Harvey Margo CROME, MD;  [facility];  Service: Endoscopy;;   STONE EXTRACTION WITH BASKET Left 09/08/2021   Procedure: STONE EXTRACTION WITH BASKET;  Surgeon: Sherrilee Belvie CROME, MD;  Location: [facility];  Service: Urology;  Laterality: Left;   STONE EXTRACTION WITH BASKET Right 10/27/2021   Procedure: STONE EXTRACTION WITH BASKET;  Surgeon: Sherrilee Belvie CROME, MD;  Location: [facility];  Service: Urology;  Laterality: Right;   ureteral extraction of kidney stone     Social History   Tobacco Use   Smoking status: Never   Smokeless tobacco: Never  Vaping Use   Vaping status: Never Used  Substance Use Topics   Alcohol  use: Yes    Comment: rare   Drug use: No   Social History   Socioeconomic History   Marital status: Divorced    Spouse name: Not on file   Number of children: 2   Years of education: Not on file   Highest education level: Not on file  Occupational History  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 06/19/21-Currently lives with daughter and is actively looking for a rental home.    1 son and 1 daughter. Son died in 11/22/15.   2 grandchildren   Left handedNo caffiene   Social Drivers of Health   Financial Resource  Strain: Low Risk  (09/19/2022)   Overall Financial Resource Strain (CARDIA)    Difficulty of Paying Living Expenses: Not hard at all  Food Insecurity: No Food Insecurity (10/04/2023)   Hunger Vital Sign    Worried About Running Out of Food in the Last Year: Never true    Ran Out of Food in the Last Year: Never true  Transportation Needs: No Transportation Needs (10/04/2023)   PRAPARE - Administrator, Civil Service (Medical): No    Lack of Transportation (Non-Medical): No  Physical Activity: Insufficiently Active (09/19/2022)   Exercise Vital Sign    Days of Exercise per Week: 3 days    Minutes of Exercise per Session: 30 min  Stress: No Stress Concern Present (09/19/2022)   [name]    Feeling of Stress : Not at all  Social Connections: Unknown (10/04/2023)   Social Connection and Isolation Panel    Frequency of Communication with Friends and Family: More than three times a week    Frequency of Social Gatherings with Friends and Family: More than three times a week    Attends Religious Services: More than 4 times per year    Active Member of [employer] or Organizations: Yes    Attends Banker Meetings: More than 4 times per year    Marital Status: Patient declined  Intimate Partner Violence: Not At Risk (10/04/2023)   Humiliation, Afraid, Rape, and Kick Newton    Fear of Current or Ex-Partner: No    Emotionally Abused: No    Physically Abused: No    Sexually Abused: No   Family Status  Relation Name Status   Mother  Alive   Father  Deceased at age 79   Sister  Deceased   Brother  Deceased   MGM  Deceased   Sister  Audiological scientist  Alive   Sister  Alive   Sister  Alive   MGF  Deceased   PGM  Deceased   PGF  Deceased   Neg Hx  (Not Specified)  No partnership data on file   Family History  Problem Relation Age of Onset   Hypertension Mother    Arthritis Mother    CAD Mother 27    Hip fracture Mother 67   COPD Father    Crohn's disease Sister    Cancer Sister        lung   Early death Brother 28       house fire   Diabetes Maternal Grandmother    Hypertension Sister    Hypertension Brother    Colon cancer Neg Hx    Allergies  Allergen Reactions   Levaquin  [Levofloxacin  In D5w] Diarrhea  Penicillins Rash      ROS Constitutional: No fever or weight loss. Reports fatigue. Cardiovascular: Intermittent chest pain at rest, 5/10 ache. No palpitations or syncope. Respiratory: No SOB, cough, or wheezing. GI: No N/V/D or abdominal pain. Musculoskeletal: No joint pain. Mild wrist discomfort related to cath access. Neuro: No headache or focal deficits. Eyes: Right eye stye. No vision changes.    Objective:     BP (!) 165/70   Pulse (!) 54   Temp (!) 97.3 F (36.3 C) (Temporal)   Ht 5' 0.5 (1.537 m)   Wt 156 lb 12.8 oz (71.1 kg)   LMP 01/22/1981 (LMP Unknown)   SpO2 97%   BMI 30.12 kg/m  BP Readings from Last 3 Encounters:  11/14/23 (!) 165/70  10/19/23 134/64  10/05/23 (!) 177/64   Wt Readings from Last 3 Encounters:  11/14/23 156 lb 12.8 oz (71.1 kg)  10/31/23 158 lb (71.7 kg)  10/19/23 159 lb 9.6 oz (72.4 kg)      Physical Exam  General: Alert and oriented x3, no acute distress. Cardiovascular: Regular rate and rhythm, no murmurs or gallops noted. Respiratory: Lungs clear to auscultation bilaterally. No rales or wheezing. Extremities: No edema. Radial pulse palpable at right wrist. No signs of hematoma at previous access site. Eyes: Right upper eyelid shows localized erythema and mild swelling consistent with a stye. No discharge noted. Vision intact.  No results found for any visits on 11/14/23.  Last CBC Lab Results  Component Value Date   WBC 10.4 10/05/2023   HGB 13.8 10/05/2023   HCT 42.1 10/05/2023   MCV 88.6 10/05/2023   MCH 29.1 10/05/2023   RDW 13.1 10/05/2023   PLT 243 10/05/2023   Last metabolic panel Lab Results   Component Value Date   GLUCOSE 138 (H) 10/05/2023   NA 142 10/05/2023   K 3.3 (L) 10/05/2023   CL 109 10/05/2023   CO2 22 10/05/2023   BUN 20 10/05/2023   CREATININE 1.05 (H) 10/05/2023   GFRNONAA 54 (L) 10/05/2023   CALCIUM  9.4 10/05/2023   PROT 6.4 05/05/2021   ALBUMIN 4.2 05/05/2021   LABGLOB 2.2 05/05/2021   AGRATIO 1.9 05/05/2021   BILITOT 0.3 05/05/2021   ALKPHOS 73 05/05/2021   AST 14 05/05/2021   ALT 12 05/05/2021   ANIONGAP 11 10/05/2023   Last lipids Lab Results  Component Value Date   CHOL 213 (H) 11/16/2022   HDL 44 11/16/2022   LDLCALC 138 (H) 11/16/2022   TRIG 175 (H) 11/16/2022   CHOLHDL 4.8 (H) 11/16/2022   Last hemoglobin A1c Lab Results  Component Value Date   HGBA1C 7.5 (H) 11/16/2022   Last thyroid  functions Lab Results  Component Value Date   TSH 1.280 05/05/2021   T4TOTAL 7.7 07/29/2014        Assessment & Plan:  Angina pectoris [facility])  Hospital discharge follow-up  Elim 78 year old Caucasian female seen today for posthospital discharge, no acute distress   Assessment -Post-cardiac catheterization follow-up - Completed 10/04/2023; patient undergoing rehab. Chest pain present, but intermittent and non-severe. Monitor closely.  -Chest pain, intermittent, non-exertional - Needs further evaluation given history of recent cath; consider ECG and cardiac enzyme labs depending on severity/pattern.  -Medication affordability issue - Difficulty affording Entresto ; discuss alternatives or enrollment in patient assistance programs.  -Hordeolum (stye), right eye - Mild, without signs of abscess or systemic involvement.  Cardiac: - follow-up  with cardiology sooner if symptoms persist or worsen.  -Reinforce importance of ED visit  if chest pain worsens or becomes severe/prolonged.  -Continue cardiac rehab as scheduled.  Medication Access: -Discuss with pharmacy or case management about Entresto  assistance programs. Call cardiology office fro  medication assistance  Stye: Prescribe erythromycin  ophthalmic ointment, apply to affected area BID for 5-7 days.  Warm compresses to the area 3-4 times daily.  Instruct patient to monitor for signs of worsening (increased swelling, pus, fever).  Education:  -Educated patient and daughter on when to seek emergency care.  -Reinforced medication adherence and follow-up appointments.   Labs: BMP result pending The above assessment and management plan was discussed with the patient. The patient verbalized understanding of and has agreed to the management plan. Patient is aware to call the clinic if they develop any new symptoms or if symptoms persist or worsen. Patient is aware when to return to the clinic for a follow-up visit. Patient educated on when it is appropriate to go to the emergency department.  No follow-ups on file.    Cieanna Stormes [place] Thompson, DNP [facility] [address] [place], [place] [address] [phone]    Note: This document was prepared by [employer] voice dictation technology and any errors that results from this process are unintentional.

## 2023-11-14 NOTE — Addendum Note (Signed)
 Addended by: DARIO IZETTA CROME on: 11/14/2023 04:42 PM   Modules accepted: Orders

## 2023-11-15 ENCOUNTER — Telehealth (HOSPITAL_COMMUNITY): Payer: Self-pay | Admitting: *Deleted

## 2023-11-15 ENCOUNTER — Encounter (HOSPITAL_COMMUNITY)

## 2023-11-15 ENCOUNTER — Ambulatory Visit: Payer: Self-pay | Admitting: Nurse Practitioner

## 2023-11-15 LAB — BASIC METABOLIC PANEL WITH GFR
BUN/Creatinine Ratio: 15 (ref 12–28)
BUN: 16 mg/dL (ref 8–27)
CO2: 22 mmol/L (ref 20–29)
Calcium: 9.7 mg/dL (ref 8.7–10.3)
Chloride: 106 mmol/L (ref 96–106)
Creatinine, Ser: 1.1 mg/dL — ABNORMAL HIGH (ref 0.57–1.00)
Glucose: 98 mg/dL (ref 70–99)
Potassium: 3.8 mmol/L (ref 3.5–5.2)
Sodium: 145 mmol/L — ABNORMAL HIGH (ref 134–144)
eGFR: 51 mL/min/1.73 — ABNORMAL LOW (ref >59)

## 2023-11-15 NOTE — Telephone Encounter (Signed)
 Called to check on patient today when she did not show for class.  Left message

## 2023-11-16 NOTE — Telephone Encounter (Signed)
 Pt calling back to f/u on refill request for  apixaban  (ELIQUIS ) 5 MG TABS tablet  and  sacubitril -valsartan  (ENTRESTO ) 97-103 MG both needing 30 day supply due to cost. Pt would like for refills to go to  [facility] [address] [place] [address]     Please advise

## 2023-11-19 MED ORDER — APIXABAN 5 MG PO TABS: 5.0000 mg | ORAL_TABLET | Freq: Two times a day (BID) | ORAL | 5 refills | Status: DC

## 2023-11-19 NOTE — Addendum Note (Signed)
 Addended by: JOESPH CHROMAN B on: 11/19/2023 08:44 AM   Modules accepted: Orders

## 2023-11-19 NOTE — Telephone Encounter (Signed)
 Prescription refill request for Eliquis  received. Indication: dvt/cva Last office visit: 10/19/23 ([place])  Scr:1.10 (11/14/23)  Age: 78 Weight: 71.1kg  Appropriate dose. Refill sent.

## 2023-11-20 ENCOUNTER — Encounter (HOSPITAL_COMMUNITY)
Admission: RE | Admit: 2023-11-20 | Discharge: 2023-11-20 | Disposition: A | Discharge status: Home / Self Care | Source: Ambulatory Visit | Attending: Cardiology | Admitting: Cardiology

## 2023-11-20 DIAGNOSIS — Z955 Presence of coronary angioplasty implant and graft: Secondary | ICD-10-CM | POA: Diagnosis not present

## 2023-11-20 NOTE — Progress Notes (Signed)
 Daily Session Note  Patient Details  Name: [name] MRN: [id number] Date of Birth: 1945/05/21 Referring Provider:   Flowsheet Row CARDIAC REHAB PHASE II ORIENTATION from 10/31/2023 in [facility]  Referring Provider Lavona Agent MD    Encounter Date: 11/20/2023  Check In:  Session Check In - 11/20/23 1330       Check-In   Supervising physician immediately available to respond to emergencies See telemetry face sheet for immediately available MD    Location [facility]    Staff Present Powell Benders, BS, Exercise Physiologist;Brittany Jackquline, BSN, RN, Estrella Daring, BS, RRT, CPFT;Tabetha Haraway Zina, RN    Virtual Visit No    Medication changes reported     No    Fall or balance concerns reported    No    Warm-up and Cool-down Performed on first and last piece of equipment    Resistance Training Performed Yes    VAD Patient? No    PAD/SET Patient? No      Pain Assessment   Currently in Pain? No/denies          Capillary Blood Glucose: No results found for this or any previous visit (from the past 24 hours).    Social History   Tobacco Use  Smoking Status Never  Smokeless Tobacco Never    Goals Met:  Independence with exercise equipment Exercise tolerated well No report of concerns or symptoms today Strength training completed today  Goals Unmet:  Not Applicable  Comments: Pt able to follow exercise prescription today without complaint.  Will continue to monitor for progression.

## 2023-11-21 ENCOUNTER — Encounter (HOSPITAL_COMMUNITY): Payer: Self-pay | Admitting: *Deleted

## 2023-11-21 DIAGNOSIS — Z955 Presence of coronary angioplasty implant and graft: Secondary | ICD-10-CM

## 2023-11-21 NOTE — Progress Notes (Signed)
 Cardiac Individual Treatment Plan  Patient Details  Name: [name] MRN: [id number] Date of Birth: 1946-02-19 Referring Provider:   Flowsheet Row CARDIAC REHAB PHASE II ORIENTATION from 10/31/2023 in [facility] CARDIAC REHABILITATION  Referring Provider Lavona Agent MD    Initial Encounter Date:  Flowsheet Row CARDIAC REHAB PHASE II ORIENTATION from 10/31/2023 in [facility] [facility] CARDIAC REHABILITATION  Date 10/31/23    Visit Diagnosis: Status post coronary artery stent placement  Patient's Home Medications on Admission:  Current Outpatient Medications:    acetaminophen  (TYLENOL ) 500 MG tablet, Take 1,000 mg by mouth every 6 (six) hours as needed for moderate pain or headache., Disp: , Rfl:    albuterol  (PROVENTIL ) (2.5 MG/3ML) 0.083% nebulizer solution, Take 3 mLs (2.5 mg total) by nebulization every 6 (six) hours as needed for wheezing or shortness of breath., Disp: 150 mL, Rfl: 1   albuterol  (VENTOLIN  HFA) 108 (90 Base) MCG/ACT inhaler, Inhale 2 puffs into the lungs every 6 (six) hours as needed for wheezing or shortness of breath., Disp: 18 g, Rfl: 0   amLODipine  (NORVASC ) 10 MG tablet, Take 1 tablet (10 mg total) by mouth daily., Disp: 90 tablet, Rfl: 2   apixaban  (ELIQUIS ) 5 MG TABS tablet, Take 1 tablet (5 mg total) by mouth 2 (two) times daily., Disp: 60 tablet, Rfl: 5   aspirin  81 MG chewable tablet, Chew 1 tablet (81 mg total) by mouth daily., Disp: , Rfl:    bisoprolol  (ZEBETA ) 5 MG tablet, Take 1 tablet (5 mg total) by mouth daily., Disp: 90 tablet, Rfl: 3   clopidogrel  (PLAVIX ) 75 MG tablet, Take 1 tablet (75 mg total) by mouth daily with breakfast., Disp: 30 tablet, Rfl: 5   erythromycin  ophthalmic ointment, Place 1 Application into the right eye at bedtime., Disp: 3.5 g, Rfl: 0   glucose blood (ONETOUCH VERIO) test strip, Use as instructed (Patient not taking: Reported on 11/14/2023), Disp: 100 each, Rfl: 12   nitroGLYCERIN  (NITROSTAT ) 0.4 MG SL tablet, Place 1 tablet (0.4  mg total) under the tongue every 5 (five) minutes as needed for chest pain., Disp: 25 tablet, Rfl: 2   ONETOUCH DELICA LANCETS FINE MISC, Use to check BG once daily (Patient not taking: Reported on 11/14/2023), Disp: 100 each, Rfl: 2   rosuvastatin  (CRESTOR ) 40 MG tablet, Take 1 tablet (40 mg total) by mouth daily., Disp: 30 tablet, Rfl: 11   sacubitril -valsartan  (ENTRESTO ) 97-103 MG, Take 1 tablet by mouth 2 (two) times daily., Disp: 60 tablet, Rfl: 11   spironolactone  (ALDACTONE ) 25 MG tablet, Take 1 tablet (25 mg total) by mouth daily., Disp: 30 tablet, Rfl: 6  Past Medical History: Past Medical History:  Diagnosis Date   Asthma    Chronic systolic heart failure (HCC)    Echo (08/26/13):  Mild LVH. EF 25% to 30%. Diffuse HK. Aortic valve: There was trivial regurgitation. Mitral valve: There was mild regurgitation. Left atrium: The atrium was mildly dilated.   GERD (gastroesophageal reflux disease)    Headache(784.0)    History of kidney stones    History of nephrolithiasis    Hypertension    NICM (nonischemic cardiomyopathy) (HCC)    a. LHC (08/2013):  no CAD   Sleep apnea    does not use machine   Stroke (HCC)    Type II or unspecified type diabetes mellitus without mention of complication, uncontrolled    borderline    Tobacco Use: Social History   Tobacco Use  Smoking Status Never  Smokeless Tobacco  Never    Labs: Review Flowsheet  More data exists      Latest Ref Rng & Units 02/07/2019 05/05/2021 08/08/2021 10/25/2021 11/16/2022  Labs for ITP Cardiac and Pulmonary Rehab  Cholestrol 100 - 199 mg/dL 796  791  - - 786   LDL (calc) 0 - 99 mg/dL 870  874  - - 861   HDL-C >39 mg/dL 40  40  - - 44   Trlycerides 0 - 149 mg/dL 806  759  - - 824   Hemoglobin A1c 4.8 - 5.6 % 8.0  7.1  6.9  7.1  7.5     Capillary Blood Glucose: Lab Results  Component Value Date   GLUCAP 302 (H) 10/04/2023   GLUCAP 148 (H) 10/04/2023   GLUCAP 135 (H) 10/27/2021   GLUCAP 135 (H) 10/06/2021    GLUCAP 140 (H) 10/06/2021     Exercise Target Goals: Exercise Program Goal: Individual exercise prescription set using results from initial 6 min walk test and THRR while considering  patient's activity barriers and safety.   Exercise Prescription Goal: Starting with aerobic activity 30 plus minutes a day, 3 days per week for initial exercise prescription. Provide home exercise prescription and guidelines that participant acknowledges understanding prior to discharge.  Activity Barriers & Risk Stratification:  Activity Barriers & Cardiac Risk Stratification - 10/31/23 1414       Activity Barriers & Cardiac Risk Stratification   Activity Barriers History of Falls;Balance Concerns;Muscular Weakness;Deconditioning    Cardiac Risk Stratification Moderate          6 Minute Walk:  6 Minute Walk     Row Name 10/31/23 1507         6 Minute Walk   Phase Initial     Distance 895 feet     Walk Time 6 minutes     # of Rest Breaks 0     MPH 1.69     METS 1.38     RPE 11     Perceived Dyspnea  3     VO2 Peak 4.84     Symptoms Yes (comment)     Comments SOB, wearing flip flops     Resting HR 60 bpm     Resting BP 122/66     Resting Oxygen Saturation  98 %     Exercise Oxygen Saturation  during 6 min walk 99 %     Max Ex. HR 92 bpm     Max Ex. BP 126/74     2 Minute Post BP 106/70        Oxygen Initial Assessment:   Oxygen Re-Evaluation:   Oxygen Discharge (Final Oxygen Re-Evaluation):   Initial Exercise Prescription:  Initial Exercise Prescription - 10/31/23 1500       Date of Initial Exercise RX and Referring Provider   Date 10/31/23    Referring Provider Lavona Agent MD      Oxygen   Maintain Oxygen Saturation 88% or higher      Treadmill   MPH 1.5    Grade 0.5    Minutes 15    METs 2.25      NuStep   Level 1    SPM 80    Minutes 15    METs 2.2      Prescription Details   Frequency (times per week) 2    Duration Progress to 30 minutes of  continuous aerobic without signs/symptoms of physical distress      Intensity   THRR  40-80% of Max Heartrate 93-126    Ratings of Perceived Exertion 11-13    Perceived Dyspnea 0-4      Progression   Progression Continue to progress workloads to maintain intensity without signs/symptoms of physical distress.      Resistance Training   Training Prescription Yes    Weight 3 lb    Reps 10-15          Perform Capillary Blood Glucose checks as needed.  Exercise Prescription Changes:   Exercise Prescription Changes     Row Name 10/31/23 1500 11/08/23 1500           Response to Exercise   Blood Pressure (Admit) 122/60 140/70      Blood Pressure (Exercise) 126/74 122/62      Blood Pressure (Exit) 106/70 148/60      Heart Rate (Admit) 60 bpm 55 bpm      Heart Rate (Exercise) 92 bpm 94 bpm      Heart Rate (Exit) 70 bpm 51 bpm      Oxygen Saturation (Admit) 98 % --      Oxygen Saturation (Exercise) 99 % --      Rating of Perceived Exertion (Exercise) 11 12      Perceived Dyspnea (Exercise) 3 --      Symptoms SOB --      Comments walk test results (in flip flops) --      Duration -- Continue with 30 min of aerobic exercise without signs/symptoms of physical distress.      Intensity -- THRR unchanged        Progression   Progression -- Continue to follow PAD protocol        Resistance Training   Training Prescription -- Yes      Weight -- 3      Reps -- 10-15        Treadmill   MPH -- 1      Grade -- 0      Minutes -- 15      METs -- 1.77        NuStep   Level -- 1      Minutes -- 15      METs -- 1.4         Exercise Comments:   Exercise Comments     Row Name 11/06/23 1337           Exercise Comments First full day of exercise!  Patient was oriented to gym and equipment including functions, settings, policies, and procedures.  Patient's individual exercise prescription and treatment plan were reviewed.  All starting workloads were established based on the  results of the 6 minute walk test done at initial orientation visit.  The plan for exercise progression was also introduced and progression will be customized based on patient's performance and goals.          Exercise Goals and Review:   Exercise Goals     Row Name 10/31/23 1510             Exercise Goals   Increase Physical Activity Yes       Intervention Provide advice, education, support and counseling about physical activity/exercise needs.;Develop an individualized exercise prescription for aerobic and resistive training based on initial evaluation findings, risk stratification, comorbidities and participant's personal goals.       Expected Outcomes Short Term: Attend rehab on a regular basis to increase amount of physical activity.;Long Term: Exercising regularly at least 3-5 days a week.;Long  Term: Add in home exercise to make exercise part of routine and to increase amount of physical activity.       Increase Strength and Stamina Yes       Intervention Provide advice, education, support and counseling about physical activity/exercise needs.;Develop an individualized exercise prescription for aerobic and resistive training based on initial evaluation findings, risk stratification, comorbidities and participant's personal goals.       Expected Outcomes Short Term: Increase workloads from initial exercise prescription for resistance, speed, and METs.;Short Term: Perform resistance training exercises routinely during rehab and add in resistance training at home;Long Term: Improve cardiorespiratory fitness, muscular endurance and strength as measured by increased METs and functional capacity ( )       Able to understand and use rate of perceived exertion (RPE) scale Yes       Intervention Provide education and explanation on how to use RPE scale       Expected Outcomes Short Term: Able to use RPE daily in rehab to express subjective intensity level;Long Term:  Able to use RPE to guide  intensity level when exercising independently       Able to understand and use Dyspnea scale Yes       Intervention Provide education and explanation on how to use Dyspnea scale       Expected Outcomes Short Term: Able to use Dyspnea scale daily in rehab to express subjective sense of shortness of breath during exertion;Long Term: Able to use Dyspnea scale to guide intensity level when exercising independently       Knowledge and understanding of Target Heart Rate Range (THRR) Yes       Intervention Provide education and explanation of THRR including how the numbers were predicted and where they are located for reference       Expected Outcomes Short Term: Able to state/look up THRR;Long Term: Able to use THRR to govern intensity when exercising independently;Short Term: Able to use daily as guideline for intensity in rehab       Able to check pulse independently Yes       Intervention Provide education and demonstration on how to check pulse in carotid and radial arteries.;Review the importance of being able to check your own pulse for safety during independent exercise       Expected Outcomes Short Term: Able to explain why pulse checking is important during independent exercise;Long Term: Able to check pulse independently and accurately       Understanding of Exercise Prescription Yes       Intervention Provide education, explanation, and written materials on patient's individual exercise prescription       Expected Outcomes Short Term: Able to explain program exercise prescription;Long Term: Able to explain home exercise prescription to exercise independently          Exercise Goals Re-Evaluation :    Discharge Exercise Prescription (Final Exercise Prescription Changes):  Exercise Prescription Changes - 11/08/23 1500       Response to Exercise   Blood Pressure (Admit) 140/70    Blood Pressure (Exercise) 122/62    Blood Pressure (Exit) 148/60    Heart Rate (Admit) 55 bpm    Heart Rate  (Exercise) 94 bpm    Heart Rate (Exit) 51 bpm    Rating of Perceived Exertion (Exercise) 12    Duration Continue with 30 min of aerobic exercise without signs/symptoms of physical distress.    Intensity THRR unchanged      Progression   Progression Continue to  follow PAD protocol      Resistance Training   Training Prescription Yes    Weight 3    Reps 10-15      Treadmill   MPH 1    Grade 0    Minutes 15    METs 1.77      NuStep   Level 1    Minutes 15    METs 1.4          Nutrition:  Target Goals: Understanding of nutrition guidelines, daily intake of sodium 1500mg , cholesterol 200mg , calories 30% from fat and 7% or less from saturated fats, daily to have 5 or more servings of fruits and vegetables.  Biometrics:  Pre Biometrics - 10/31/23 1510       Pre Biometrics   Height 5' 0.5 (1.537 m)    Weight 158 lb (71.7 kg)    Waist Circumference 35.25 inches    Hip Circumference 41.25 inches    Waist to Hip Ratio 0.85 %    BMI (Calculated) 30.34    Grip Strength 9.1 kg    Single Leg Stand 4 seconds           Nutrition Therapy Plan and Nutrition Goals:  Nutrition Therapy & Goals - 10/31/23 1515       Intervention Plan   Intervention Prescribe, educate and counsel regarding individualized specific dietary modifications aiming towards targeted core components such as weight, hypertension, lipid management, diabetes, heart failure and other comorbidities.;Nutrition handout(s) given to patient.    Expected Outcomes Short Term Goal: Understand basic principles of dietary content, such as calories, fat, sodium, cholesterol and nutrients.;Long Term Goal: Adherence to prescribed nutrition plan.          Nutrition Assessments:  MEDIFICTS Score Key: >=70 Need to make dietary changes  40-70 Heart Healthy Diet <= 40 Therapeutic Level Cholesterol Diet  Flowsheet Row CARDIAC REHAB PHASE II ORIENTATION from 10/31/2023 in [facility] CARDIAC REHABILITATION  Picture  Your Plate Total Score on Admission 60   Picture Your Plate Scores: <59 Unhealthy dietary pattern with much room for improvement. 41-50 Dietary pattern unlikely to meet recommendations for good health and room for improvement. 51-60 More healthful dietary pattern, with some room for improvement.  >60 Healthy dietary pattern, although there may be some specific behaviors that could be improved.    Nutrition Goals Re-Evaluation:   Nutrition Goals Discharge (Final Nutrition Goals Re-Evaluation):   Psychosocial: Target Goals: Acknowledge presence or absence of significant depression and/or stress, maximize coping skills, provide positive support system. Participant is able to verbalize types and ability to use techniques and skills needed for reducing stress and depression.  Initial Review & Psychosocial Screening:  Initial Psych Review & Screening - 10/31/23 1418       Initial Review   Current issues with History of Depression;Current Stress Concerns    Source of Stress Concerns Unable to participate in former interests or hobbies;Unable to perform yard/household activities;Financial    Comments In process of moving.  Was living with daughter in [place], moving to [place] to be closer to work      [employer]   Good Support System? Yes   Daughter, friends from church     Barriers   Psychosocial barriers to participate in program Psychosocial barriers identified (see note);The patient should benefit from training in stress management and relaxation.      Screening Interventions   Interventions Encouraged to exercise;To provide support and resources with identified psychosocial needs;Provide feedback about the scores to  participant    Expected Outcomes Short Term goal: Utilizing psychosocial counselor, staff and physician to assist with identification of specific Stressors or current issues interfering with healing process. Setting desired goal for each stressor or current issue  identified.;Long Term Goal: Stressors or current issues are controlled or eliminated.;Short Term goal: Identification and review with participant of any Quality of Life or Depression concerns found by scoring the questionnaire.;Long Term goal: The participant improves quality of Life and PHQ9 Scores as seen by post scores and/or verbalization of changes          Quality of Life Scores:  Quality of Life - 10/31/23 1514       Quality of Life   Select Quality of Life      Quality of Life Scores   Health/Function Pre 22.17 %    Socioeconomic Pre 19 %    Psych/Spiritual Pre 22.71 %    Family Pre 25.8 %    GLOBAL Pre 22.07 %         Scores of 19 and below usually indicate a poorer quality of life in these areas.  A difference of  2-3 points is a clinically meaningful difference.  A difference of 2-3 points in the total score of the Quality of Life Index has been associated with significant improvement in overall quality of life, self-image, physical symptoms, and general health in studies assessing change in quality of life.  PHQ-9: Review Flowsheet  More data exists      10/31/2023 11/16/2022 09/19/2022 09/28/2021 06/20/2021  Depression screen PHQ 2/9  Decreased Interest 1 0 0 1 1  Down, Depressed, Hopeless 1 0 0 1 1  PHQ - 2 Score 2 0 0 2 2  Altered sleeping 0 1 - 0 2  Tired, decreased energy 3 1 - 1 2  Change in appetite 0 0 - 1 0  Feeling bad or failure about yourself  1 0 - 0 0  Trouble concentrating 1 0 - 1 1  Moving slowly or fidgety/restless 0 0 - 0 0  Suicidal thoughts 0 0 - 0 0  PHQ-9 Score 7 2 - 5 7  Difficult doing work/chores Somewhat difficult Not difficult at all - Somewhat difficult Somewhat difficult   Interpretation of Total Score  Total Score Depression Severity:  1-4 = Minimal depression, 5-9 = Mild depression, 10-14 = Moderate depression, 15-19 = Moderately severe depression, 20-27 = Severe depression   Psychosocial Evaluation and Intervention:  Psychosocial  Evaluation - 10/31/23 1511       Psychosocial Evaluation & Interventions   Comments Zephyr is coming into cardiac rehab after having a stent placed in June.  She is also in the middle of moving from [place] to [place] to be closer to work.  She was living with her daughter (her biggest supporter) but wanted to move out to be independent and close to work since she goes in 4 days a week. 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  She is eager to build her strength and stamina back up. She wants to get into a routine for herself again.  She usually sleeps well.  She does not precieve any issues with getting to rehab reguarly.  She does have a copay and will let us  know if it gets to be too much. She has a history of meds but is no longer needing them.  She is also off of her type 2 DM meds which she is very proud of  herself.    Expected Outcomes Short: Attend rehab to build up stamina. Long: Continue to improve strength    Continue Psychosocial Services  Follow up required by staff          Psychosocial Re-Evaluation:   Psychosocial Discharge (Final Psychosocial Re-Evaluation):   Vocational Rehabilitation: Provide vocational rehab assistance to qualifying candidates.   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 Participant will state understanding/return demonstration of topics presented.  Learning Barriers/Preferences:  Learning Barriers/Preferences - 10/31/23 1416       Learning Barriers/Preferences   Learning Barriers None    Learning Preferences None          Education Topics: Hypertension, Hypertension Reduction -Define heart disease and high  blood pressure. Discus how high blood pressure affects the body and ways to reduce high blood pressure.   Exercise and Your Heart -Discuss why it is important to exercise, the FITT principles of exercise, normal and abnormal responses to exercise, and how to exercise safely.   Angina -Discuss definition of angina, causes of angina, treatment of angina, and how to decrease risk of having angina.   Cardiac Medications -Review what the following cardiac medications are used for, how they affect the body, and side effects that may occur when taking the medications.  Medications include Aspirin , Beta blockers, calcium  channel blockers, ACE Inhibitors, angiotensin receptor blockers, diuretics, digoxin, and antihyperlipidemics. Flowsheet Row CARDIAC REHAB PHASE II EXERCISE from 11/08/2023 in [facility] CARDIAC REHABILITATION  Date 11/08/23  Educator [facility]  Instruction Review Code 1- Verbalizes Understanding    Congestive Heart Failure -Discuss the definition of CHF, how to live with CHF, the signs and symptoms of CHF, and how keep track of weight and sodium intake.   Heart Disease and Intimacy -Discus the effect sexual activity has on the heart, how changes occur during intimacy as we age, and safety during sexual activity.   Smoking Cessation / COPD -Discuss different methods to quit smoking, the health benefits of quitting smoking, and the definition of COPD.   Nutrition I: Fats -Discuss the types of cholesterol, what cholesterol does to the heart, and how cholesterol levels can be controlled.   Nutrition II: Labels -Discuss the different components of food labels and how to read food label   Heart Parts/Heart Disease and PAD -Discuss the anatomy of the heart, the pathway of blood circulation through the heart, and these are affected by heart disease.   Stress I: Signs and Symptoms -Discuss the causes of stress, how stress may lead to anxiety and depression, and ways to limit  stress.   Stress II: Relaxation -Discuss different types of relaxation techniques to limit stress.   Warning Signs of Stroke / TIA -Discuss definition of a stroke, what the signs and symptoms are of a stroke, and how to identify when someone is having stroke.   Knowledge Questionnaire Score:  Knowledge Questionnaire Score - 10/31/23 1515       Knowledge Questionnaire Score   Pre Score 22/26          Core Components/Risk Factors/Patient Goals at Admission:  Personal Goals and Risk Factors at Admission - 10/31/23 1515  Core Components/Risk Factors/Patient Goals on Admission    Weight Management Yes;Weight Loss;Obesity    Intervention Weight Management: Develop a combined nutrition and exercise program designed to reach desired caloric intake, while maintaining appropriate intake of nutrient and fiber, sodium and fats, and appropriate energy expenditure required for the weight goal.;Weight Management: Provide education and appropriate resources to help participant work on and attain dietary goals.;Weight Management/Obesity: Establish reasonable short term and long term weight goals.;Obesity: Provide education and appropriate resources to help participant work on and attain dietary goals.    Admit Weight 158 lb (71.7 kg)    Goal Weight: Short Term 155 lb (70.3 kg)    Goal Weight: Long Term 150 lb (68 kg)    Expected Outcomes Short Term: Continue to assess and modify interventions until short term weight is achieved;Long Term: Adherence to nutrition and physical activity/exercise program aimed toward attainment of established weight goal;Weight Loss: Understanding of general recommendations for a balanced deficit meal plan, which promotes 1-2 lb weight loss per week and includes a negative energy balance of [phone] kcal/d;Understanding recommendations for meals to include 15-35% energy as protein, 25-35% energy from fat, 35-60% energy from carbohydrates, less than 200mg  of dietary  cholesterol, 20-35 gm of total fiber daily;Understanding of distribution of calorie intake throughout the day with the consumption of 4-5 meals/snacks    Improve shortness of breath with ADL's Yes    Intervention Provide education, individualized exercise plan and daily activity instruction to help decrease symptoms of SOB with activities of daily living.    Expected Outcomes Short Term: Improve cardiorespiratory fitness to achieve a reduction of symptoms when performing ADLs;Long Term: Be able to perform more ADLs without symptoms or delay the onset of symptoms    Diabetes Yes   no longer on meds or checking sugars   Intervention Provide education about signs/symptoms and action to take for hypo/hyperglycemia.;Provide education about proper nutrition, including hydration, and aerobic/resistive exercise prescription along with prescribed medications to achieve blood glucose in normal ranges: Fasting glucose 65-99 mg/dL    Expected Outcomes Short Term: Participant verbalizes understanding of the signs/symptoms and immediate care of hyper/hypoglycemia, proper foot care and importance of medication, aerobic/resistive exercise and nutrition plan for blood glucose control.    Heart Failure Yes    Intervention Provide a combined exercise and nutrition program that is supplemented with education, support and counseling about heart failure. Directed toward relieving symptoms such as shortness of breath, decreased exercise tolerance, and extremity edema.    Expected Outcomes Improve functional capacity of life;Short term: Attendance in program 2-3 days a week with increased exercise capacity. Reported lower sodium intake. Reported increased fruit and vegetable intake. Reports medication compliance.;Short term: Daily weights obtained and reported for increase. Utilizing diuretic protocols set by physician.;Long term: Adoption of self-care skills and reduction of barriers for early signs and symptoms recognition and  intervention leading to self-care maintenance.    Hypertension Yes    Intervention Provide education on lifestyle modifcations including regular physical activity/exercise, weight management, moderate sodium restriction and increased consumption of fresh fruit, vegetables, and low fat dairy, alcohol  moderation, and smoking cessation.;Monitor prescription use compliance.    Expected Outcomes Short Term: Continued assessment and intervention until BP is < 140/33mm HG in hypertensive participants. < 130/25mm HG in hypertensive participants with diabetes, heart failure or chronic kidney disease.;Long Term: Maintenance of blood pressure at goal levels.    Lipids Yes    Intervention Provide education and support for participant on nutrition & aerobic/resistive exercise  along with prescribed medications to achieve LDL 70mg , HDL >40mg .    Expected Outcomes Short Term: Participant states understanding of desired cholesterol values and is compliant with medications prescribed. Participant is following exercise prescription and nutrition guidelines.;Long Term: Cholesterol controlled with medications as prescribed, with individualized exercise RX and with personalized nutrition plan. Value goals: LDL < 70mg , HDL > 40 mg.          Core Components/Risk Factors/Patient Goals Review:    Core Components/Risk Factors/Patient Goals at Discharge (Final Review):    ITP Comments:  ITP Comments     Row Name 10/31/23 1503 11/06/23 1337 11/21/23 1248       ITP Comments Patient attend orientation today.  Patient is attending Cardiac Rehabilitation Program.  Documentation for diagnosis can be found in 10/05/23.  Reviewed medical chart, RPE/RPD, gym safety, and program guidelines.  Patient was fitted to equipment they will be using during rehab.  Patient is scheduled to start exercise on Tuesday 11/06/23 at 1330.   Initial ITP created and sent for review and signature by Dr. Dorn Ross, Medical Director for Cardiac  Rehabilitation Program. First full day of exercise!  Patient was oriented to gym and equipment including functions, settings, policies, and procedures.  Patient's individual exercise prescription and treatment plan were reviewed.  All starting workloads were established based on the results of the 6 minute walk test done at initial orientation visit.  The plan for exercise progression was also introduced and progression will be customized based on patient's performance and goals. 30 day review completed. ITP sent to Dr. Dorn Ross, Medical Director of Cardiac Rehab. Continue with ITP unless changes are made by physician.  Newer to program.        Comments: 30 day review

## 2023-11-22 ENCOUNTER — Encounter (HOSPITAL_COMMUNITY)
Admission: RE | Admit: 2023-11-22 | Discharge: 2023-11-22 | Disposition: A | Discharge status: Home / Self Care | Source: Ambulatory Visit | Attending: Cardiology | Admitting: Cardiology

## 2023-11-22 DIAGNOSIS — Z955 Presence of coronary angioplasty implant and graft: Secondary | ICD-10-CM | POA: Diagnosis not present

## 2023-11-22 NOTE — Progress Notes (Signed)
 Daily Session Note  Patient Details  Name: [name] MRN: [id number] Date of Birth: 1945-08-21 Referring Provider:   Flowsheet Row CARDIAC REHAB PHASE II ORIENTATION from 10/31/2023 in [facility] CARDIAC REHABILITATION  Referring Provider Lavona Agent MD    Encounter Date: 11/22/2023  Check In:  Session Check In - 11/22/23 1330       Check-In   Supervising physician immediately available to respond to emergencies See telemetry face sheet for immediately available MD    Location [facility]    Staff Present Powell Benders, BS, Exercise Physiologist;Debra Vicci, RN, BSN;Other    Virtual Visit No    Medication changes reported     No    Fall or balance concerns reported    No    Tobacco Cessation No Change    Warm-up and Cool-down Performed on first and last piece of equipment    Resistance Training Performed Yes    VAD Patient? No    PAD/SET Patient? No      Pain Assessment   Currently in Pain? No/denies    Multiple Pain Sites No          Capillary Blood Glucose: No results found for this or any previous visit (from the past 24 hours).    Social History   Tobacco Use  Smoking Status Never  Smokeless Tobacco Never    Goals Met:  Independence with exercise equipment Exercise tolerated well No report of concerns or symptoms today Strength training completed today  Goals Unmet:  Not Applicable  Comments: Pt able to follow exercise prescription today without complaint.  Will continue to monitor for progression.

## 2023-11-27 ENCOUNTER — Encounter (HOSPITAL_COMMUNITY)
Admission: RE | Admit: 2023-11-27 | Discharge: 2023-11-27 | Disposition: A | Discharge status: Home / Self Care | Source: Ambulatory Visit | Attending: Cardiology | Admitting: Cardiology

## 2023-11-27 DIAGNOSIS — Z955 Presence of coronary angioplasty implant and graft: Secondary | ICD-10-CM | POA: Diagnosis not present

## 2023-11-27 NOTE — Progress Notes (Signed)
 Daily Session Note  Patient Details  Name: [name] MRN: [id number] Date of Birth: 07/01/1945 Referring Provider:   Flowsheet Row CARDIAC REHAB PHASE II ORIENTATION from 10/31/2023 in [facility] CARDIAC REHABILITATION  Referring Provider Lavona Agent MD    Encounter Date: 11/27/2023  Check In:  Session Check In - 11/27/23 1329       Check-In   Supervising physician immediately available to respond to emergencies See telemetry face sheet for immediately available MD    Location [facility]    Staff Present Powell Benders, BS, Exercise Physiologist;Jessica Vonzell, MA, RCEP, CCRP, CCET;Brittany Jackquline, BSN, RN, WTA-C    Virtual Visit No    Medication changes reported     No    Fall or balance concerns reported    No    Tobacco Cessation No Change    Warm-up and Cool-down Performed on first and last piece of equipment    Resistance Training Performed Yes    VAD Patient? No    PAD/SET Patient? No      Pain Assessment   Currently in Pain? No/denies    Multiple Pain Sites No          Capillary Blood Glucose: No results found for this or any previous visit (from the past 24 hours).    Social History   Tobacco Use  Smoking Status Never  Smokeless Tobacco Never    Goals Met:  Independence with exercise equipment Exercise tolerated well No report of concerns or symptoms today Strength training completed today  Goals Unmet:  Not Applicable  Comments: Pt able to follow exercise prescription today without complaint.  Will continue to monitor for progression.

## 2023-11-29 ENCOUNTER — Encounter (HOSPITAL_COMMUNITY)

## 2023-12-04 ENCOUNTER — Encounter (HOSPITAL_COMMUNITY)
Admission: RE | Admit: 2023-12-04 | Discharge: 2023-12-04 | Disposition: A | Discharge status: Home / Self Care | Source: Ambulatory Visit | Attending: Cardiology | Admitting: Cardiology

## 2023-12-04 DIAGNOSIS — Z955 Presence of coronary angioplasty implant and graft: Secondary | ICD-10-CM

## 2023-12-04 NOTE — Progress Notes (Signed)
 Daily Session Note  Patient Details  Name: [name] MRN: [id number] Date of Birth: 03-19-1946 Referring Provider:   Flowsheet Row CARDIAC REHAB PHASE II ORIENTATION from 10/31/2023 in [facility] CARDIAC REHABILITATION  Referring Provider Lavona Agent MD    Encounter Date: 12/04/2023  Check In:  Session Check In - 12/04/23 1330       Check-In   Supervising physician immediately available to respond to emergencies See telemetry face sheet for immediately available MD    Location [facility]    Staff Present Powell Benders, BS, Exercise Physiologist;Brittany Jackquline, BSN, RN, WTA-C;Victoria Zina, RN    Virtual Visit No    Medication changes reported     No    Fall or balance concerns reported    No    Tobacco Cessation No Change    Warm-up and Cool-down Performed on first and last piece of equipment    Resistance Training Performed Yes    VAD Patient? No    PAD/SET Patient? No      Pain Assessment   Currently in Pain? No/denies    Multiple Pain Sites No          Capillary Blood Glucose: No results found for this or any previous visit (from the past 24 hours).    Social History   Tobacco Use  Smoking Status Never  Smokeless Tobacco Never    Goals Met:  Independence with exercise equipment Exercise tolerated well No report of concerns or symptoms today Strength training completed today  Goals Unmet:  Not Applicable  Comments: Pt able to follow exercise prescription today without complaint.  Will continue to monitor for progression.

## 2023-12-06 ENCOUNTER — Encounter (HOSPITAL_COMMUNITY)

## 2023-12-11 ENCOUNTER — Encounter (HOSPITAL_COMMUNITY)
Admission: RE | Admit: 2023-12-11 | Discharge: 2023-12-11 | Disposition: A | Discharge status: Home / Self Care | Source: Ambulatory Visit | Attending: Cardiology | Admitting: Cardiology

## 2023-12-11 DIAGNOSIS — Z955 Presence of coronary angioplasty implant and graft: Secondary | ICD-10-CM | POA: Diagnosis not present

## 2023-12-11 NOTE — Progress Notes (Signed)
 Daily Session Note  Patient Details  Name: [name] MRN: [id number] Date of Birth: [birth year] Referring Provider:   Flowsheet Row CARDIAC REHAB PHASE II ORIENTATION from 10/31/2023 in [facility] CARDIAC REHABILITATION  Referring Provider Lavona Agent MD    Encounter Date: 12/11/2023  Check In:  Session Check In - 12/11/23 1402       Check-In   Supervising physician immediately available to respond to emergencies See telemetry face sheet for immediately available MD    Location [facility]    Staff Present Powell Benders, BS, Exercise Physiologist;Brittany Jackquline, BSN, RN, WTA-C;Zareah Hunzeker Zina, RN    Virtual Visit No    Medication changes reported     No    Fall or balance concerns reported    No    Warm-up and Cool-down Performed on first and last piece of equipment    Resistance Training Performed Yes    VAD Patient? No    PAD/SET Patient? No      Pain Assessment   Currently in Pain? No/denies          Capillary Blood Glucose: No results found for this or any previous visit (from the past 24 hours).    Social History   Tobacco Use  Smoking Status Never  Smokeless Tobacco Never    Goals Met:  Independence with exercise equipment Exercise tolerated well No report of concerns or symptoms today Strength training completed today  Goals Unmet:  Not Applicable  Comments: Pt able to follow exercise prescription today without complaint.  Will continue to monitor for progression.

## 2023-12-13 ENCOUNTER — Encounter (HOSPITAL_COMMUNITY)
Admission: RE | Admit: 2023-12-13 | Discharge: 2023-12-13 | Disposition: A | Discharge status: Home / Self Care | Source: Ambulatory Visit | Attending: Cardiology | Admitting: Cardiology

## 2023-12-13 DIAGNOSIS — Z955 Presence of coronary angioplasty implant and graft: Secondary | ICD-10-CM

## 2023-12-13 NOTE — Progress Notes (Signed)
 Daily Session Note  Patient Details  Name: RHODESIA STANGER MRN: [id number] Date of Birth: [birth year] Referring Provider:   Flowsheet Row CARDIAC REHAB PHASE II ORIENTATION from 10/31/2023 in [facility] CARDIAC REHABILITATION  Referring Provider Lavona Agent MD    Encounter Date: 12/13/2023  Check In:  Session Check In - 12/13/23 1518       Check-In   Supervising physician immediately available to respond to emergencies See telemetry face sheet for immediately available MD    Location [facility]    Staff Present Powell Benders, BS, Exercise Physiologist;Cornelia Walraven Vonzell, MA, RCEP, CCRP, Sueellen Louder, RN, BSN    Virtual Visit No    Medication changes reported     No    Fall or balance concerns reported    No    Warm-up and Cool-down Performed on first and last piece of equipment    Resistance Training Performed Yes    VAD Patient? No    PAD/SET Patient? No      Pain Assessment   Currently in Pain? No/denies          Capillary Blood Glucose: No results found for this or any previous visit (from the past 24 hours).    Social History   Tobacco Use  Smoking Status Never  Smokeless Tobacco Never    Goals Met:  Independence with exercise equipment Exercise tolerated well No report of concerns or symptoms today Strength training completed today  Goals Unmet:  Not Applicable  Comments: Pt able to follow exercise prescription today without complaint.  Will continue to monitor for progression.

## 2023-12-18 ENCOUNTER — Encounter (HOSPITAL_COMMUNITY)

## 2023-12-19 ENCOUNTER — Encounter (HOSPITAL_COMMUNITY): Payer: Self-pay | Admitting: *Deleted

## 2023-12-19 DIAGNOSIS — I2510 Atherosclerotic heart disease of native coronary artery without angina pectoris: Secondary | ICD-10-CM | POA: Insufficient documentation

## 2023-12-19 DIAGNOSIS — Z86718 Personal history of other venous thrombosis and embolism: Secondary | ICD-10-CM | POA: Insufficient documentation

## 2023-12-19 DIAGNOSIS — E785 Hyperlipidemia, unspecified: Secondary | ICD-10-CM | POA: Insufficient documentation

## 2023-12-19 DIAGNOSIS — Z955 Presence of coronary angioplasty implant and graft: Secondary | ICD-10-CM

## 2023-12-19 NOTE — Progress Notes (Unsigned)
 Cardiology Office Note:   Date:  12/20/2023  ID:  [name], DOB Jan 24, 1946, MRN [id number] PCP: Lavell Bari LABOR, FNP  [facility] Providers Cardiologist:  Lynwood Schilling, MD {  History of Present Illness:   [name] is a 78 y.o. female for ongoing assessment and management of hypertension, chronic dyspnea, nonischemic cardiomyopathy, with probable OSA. In August 2023 she was found to have DVT. This was in the right popliteal and femoral veins. Since I last saw her she has been complaining of some jaw discomfort.  I sent her for CT and ultimately cardiac cath.  She was found to have mid LAD 85% stenosis.  First diagonal 80% stenosis.  First obtuse marginal had 85% stenosis.  She had stent Synergy to the mid LAD.  She had a stent to the first marginal.  This was Synergy.  She presents for follow-up.     She is not sure that her symptoms are much better having had a stent.  She still getting some jaw pain.  She is very fatigued.  She is upset because she has had trouble getting the Entresto  up so expensive.  She is not having any new shortness of breath, PND or orthopnea.  She is not having any new palpitations, presyncope or syncope.  She has had some chronic lower extremity swelling.  She is doing rehab and her blood pressures have been pretty well-controlled at rehab though elevated here today.  She did not take her medications this morning.  ROS: As stated in the HPI and negative for all other systems.  Studies Reviewed:    EKG:     NA  Risk Assessment/Calculations:      Physical Exam:   VS:  BP (!) 180/83   Pulse 80   Ht 5' 4 (1.626 m)   Wt 158 lb (71.7 kg)   LMP 01/22/1981 (LMP Unknown)   SpO2 95%   BMI 27.12 kg/m    Wt Readings from Last 3 Encounters:  12/20/23 158 lb (71.7 kg)  11/14/23 156 lb 12.8 oz (71.1 kg)  10/31/23 158 lb (71.7 kg)     GEN: Well nourished, well developed in no acute distress NECK: No JVD; No carotid bruits CARDIAC: RRR, no  murmurs, rubs, gallops RESPIRATORY:  Clear to auscultation without rales, wheezing or rhonchi  ABDOMEN: Soft, non-tender, non-distended EXTREMITIES: Mild to moderate right greater than left lower extremity edema; No deformity, there is a mild raised red rash with excoriations in the right pretibial  ASSESSMENT AND PLAN:   Hypertension: Her blood pressure is elevated but not typically when she is at rehab and getting it measured there.  I am going to be adding the isosorbide  and she can keep a blood pressure diary.  CAD: She still getting some chest discomfort.  She had a relatively long but narrow caliber diagonal that could be giving her symptoms that had to be managed medically.  I am gena start isosorbide  30 mg daily and we will go up on this dose if she is continuing to have chest pain.  It is also possible that it might be not anginal and she wondered if it might be reflux.  She can talk this over with her primary provider.   Nonischemic cardiomyopathy:   EF improved from 30 to 65% in 2020 and was 60 to 65% in August.  No change in therapy other than above.   DVT: This was unprovoked and she is to remain on systemic DOAC.  She has some lower extremity swelling and a bit of a rash.  I told her if this gets worse she should see her primary and might need antibiotics.     Aortic atherosclerosis:.  We are pursuing aggressive secondary risk reduction.    Dyslipidemia: LDL goal will be 55.   Continue current therapy.  Goals of therapy will be pending the results of the CT.        Follow up with me in 3 months in [place]  Signed, Lynwood Schilling, MD

## 2023-12-19 NOTE — Progress Notes (Signed)
 Cardiac Individual Treatment Plan  Patient Details  Name: [name] MRN: [id number] Date of Birth: 1945-11-09 Referring Provider:   Flowsheet Row CARDIAC REHAB PHASE II ORIENTATION from 10/31/2023 in [facility] CARDIAC REHABILITATION  Referring Provider Lavona Agent MD    Initial Encounter Date:  Flowsheet Row CARDIAC REHAB PHASE II ORIENTATION from 10/31/2023 in [facility]ARDIAC REH[facility]ILITATION  Date 10/31/23    Visit Diagnosis: Status post coronary artery stent placement  Patient's Home Medications on Admission:  Current Outpatient Medications:    acetaminophen  (TYLENOL ) 500 MG tablet, Take 1,000 mg by mouth every 6 (six) hours as needed for moderate pain or headache., Disp: , Rfl:    albuterol  (PROVENTIL ) (2.5 MG/3ML) 0.083% nebulizer solution, Take 3 mLs (2.5 mg total) by nebulization every 6 (six) hours as needed for wheezing or shortness of breath., Disp: 150 mL, Rfl: 1   albuterol  (VENTOLIN  HFA) 108 (90 Base) MCG/ACT inhaler, Inhale 2 puffs into the lungs every 6 (six) hours as needed for wheezing or shortness of breath., Disp: 18 g, Rfl: 0   amLODipine  (NORVASC ) 10 MG tablet, Take 1 tablet (10 mg total) by mouth daily., Disp: 90 tablet, Rfl: 2   apixaban  (ELIQUIS ) 5 MG TABS tablet, Take 1 tablet (5 mg total) by mouth 2 (two) times daily., Disp: 60 tablet, Rfl: 5   aspirin  81 MG chewable tablet, Chew 1 tablet (81 mg total) by mouth daily., Disp: , Rfl:    bisoprolol  (ZEBETA ) 5 MG tablet, Take 1 tablet (5 mg total) by mouth daily., Disp: 90 tablet, Rfl: 3   clopidogrel  (PLAVIX ) 75 MG tablet, Take 1 tablet (75 mg total) by mouth daily with breakfast., Disp: 30 tablet, Rfl: 5   erythromycin  ophthalmic ointment, Place 1 Application into the right eye at bedtime., Disp: 3.5 g, Rfl: 0   glucose blood (ONETOUCH VERIO) test strip, Use as instructed (Patient not taking: Reported on 11/14/2023), Disp: 100 each, Rfl: 12   nitroGLYCERIN  (NITROSTAT ) 0.4 MG SL tablet, Place 1 tablet (0.4  mg total) under the tongue every 5 (five) minutes as needed for chest pain., Disp: 25 tablet, Rfl: 2   ONETOUCH DELICA LANCETS FINE MISC, Use to check BG once daily (Patient not taking: Reported on 11/14/2023), Disp: 100 each, Rfl: 2   rosuvastatin  (CRESTOR ) 40 MG tablet, Take 1 tablet (40 mg total) by mouth daily., Disp: 30 tablet, Rfl: 11   sacubitril -valsartan  (ENTRESTO ) 97-103 MG, Take 1 tablet by mouth 2 (two) times daily., Disp: 60 tablet, Rfl: 11   spironolactone  (ALDACTONE ) 25 MG tablet, Take 1 tablet (25 mg total) by mouth daily., Disp: 30 tablet, Rfl: 6  Past Medical History: Past Medical History:  Diagnosis Date   Asthma    Chronic systolic heart failure (HCC)    Echo (08/26/13):  Mild LVH. EF 25% to 30%. Diffuse HK. Aortic valve: There was trivial regurgitation. Mitral valve: There was mild regurgitation. Left atrium: The atrium was mildly dilated.   GERD (gastroesophageal reflux disease)    Headache(784.0)    History of kidney stones    History of nephrolithiasis    Hypertension    NICM (nonischemic cardiomyopathy) (HCC)    a. LHC (08/2013):  no CAD   Sleep apnea    does not use machine   Stroke (HCC)    Type II or unspecified type diabetes mellitus without mention of complication, uncontrolled    borderline    Tobacco Use: Social History   Tobacco Use  Smoking Status Never  Smokeless Tobacco  Never    Labs: Review Flowsheet  More data exists      Latest Ref Rng & Units 02/07/2019 05/05/2021 08/08/2021 10/25/2021 11/16/2022  Labs for ITP Cardiac and Pulmonary Rehab  Cholestrol 100 - 199 mg/dL 796  791  - - 786   LDL (calc) 0 - 99 mg/dL 870  874  - - 861   HDL-C >39 mg/dL 40  40  - - 44   Trlycerides 0 - 149 mg/dL 806  759  - - 824   Hemoglobin A1c 4.8 - 5.6 % 8.0  7.1  6.9  7.1  7.5     Capillary Blood Glucose: Lab Results  Component Value Date   GLUCAP 302 (H) 10/04/2023   GLUCAP 148 (H) 10/04/2023   GLUCAP 135 (H) 10/27/2021   GLUCAP 135 (H) 10/06/2021    GLUCAP 140 (H) 10/06/2021     Exercise Target Goals: Exercise Program Goal: Individual exercise prescription set using results from initial 6 min walk test and THRR while considering  patient's activity barriers and safety.   Exercise Prescription Goal: Starting with aerobic activity 30 plus minutes a day, 3 days per week for initial exercise prescription. Provide home exercise prescription and guidelines that participant acknowledges understanding prior to discharge.  Activity Barriers & Risk Stratification:  Activity Barriers & Cardiac Risk Stratification - 10/31/23 1414       Activity Barriers & Cardiac Risk Stratification   Activity Barriers History of Falls;Balance Concerns;Muscular Weakness;Deconditioning    Cardiac Risk Stratification Moderate          6 Minute Walk:  6 Minute Walk     Row Name 10/31/23 1507         6 Minute Walk   Phase Initial     Distance 895 feet     Walk Time 6 minutes     # of Rest Breaks 0     MPH 1.69     METS 1.38     RPE 11     Perceived Dyspnea  3     VO2 Peak 4.84     Symptoms Yes (comment)     Comments SOB, wearing flip flops     Resting HR 60 bpm     Resting BP 122/66     Resting Oxygen Saturation  98 %     Exercise Oxygen Saturation  during 6 min walk 99 %     Max Ex. HR 92 bpm     Max Ex. BP 126/74     2 Minute Post BP 106/70        Oxygen Initial Assessment:   Oxygen Re-Evaluation:   Oxygen Discharge (Final Oxygen Re-Evaluation):   Initial Exercise Prescription:  Initial Exercise Prescription - 10/31/23 1500       Date of Initial Exercise RX and Referring Provider   Date 10/31/23    Referring Provider Lavona Agent MD      Oxygen   Maintain Oxygen Saturation 88% or higher      Treadmill   MPH 1.5    Grade 0.5    Minutes 15    METs 2.25      NuStep   Level 1    SPM 80    Minutes 15    METs 2.2      Prescription Details   Frequency (times per week) 2    Duration Progress to 30 minutes of  continuous aerobic without signs/symptoms of physical distress      Intensity   THRR  40-80% of Max Heartrate 93-126    Ratings of Perceived Exertion 11-13    Perceived Dyspnea 0-4      Progression   Progression Continue to progress workloads to maintain intensity without signs/symptoms of physical distress.      Resistance Training   Training Prescription Yes    Weight 3 lb    Reps 10-15          Perform Capillary Blood Glucose checks as needed.  Exercise Prescription Changes:   Exercise Prescription Changes     Row Name 10/31/23 1500 11/08/23 1500 12/04/23 1500         Response to Exercise   Blood Pressure (Admit) 122/60 140/70 120/64     Blood Pressure (Exercise) 126/74 122/62 136/82     Blood Pressure (Exit) 106/70 148/60 110/70     Heart Rate (Admit) 60 bpm 55 bpm 75 bpm     Heart Rate (Exercise) 92 bpm 94 bpm 117 bpm     Heart Rate (Exit) 70 bpm 51 bpm 90 bpm     Oxygen Saturation (Admit) 98 % -- --     Oxygen Saturation (Exercise) 99 % -- --     Rating of Perceived Exertion (Exercise) 11 12 15      Perceived Dyspnea (Exercise) 3 -- --     Symptoms SOB -- --     Comments walk test results (in flip flops) -- --     Duration -- Continue with 30 min of aerobic exercise without signs/symptoms of physical distress. Continue with 30 min of aerobic exercise without signs/symptoms of physical distress.     Intensity -- THRR unchanged THRR unchanged       Progression   Progression -- Continue to follow PAD protocol Continue to progress workloads to maintain intensity without signs/symptoms of physical distress.       Resistance Training   Training Prescription -- Yes Yes     Weight -- 3 3     Reps -- 10-15 10-15       Treadmill   MPH -- 1 1.2     Grade -- 0 0     Minutes -- 15 15     METs -- 1.77 1.92       NuStep   Level -- 1 2     SPM -- -- 38     Minutes -- 15 15     METs -- 1.4 1.8        Exercise Comments:   Exercise Comments     Row Name  11/06/23 1337           Exercise Comments First full day of exercise!  Patient was oriented to gym and equipment including functions, settings, policies, and procedures.  Patient's individual exercise prescription and treatment plan were reviewed.  All starting workloads were established based on the results of the 6 minute walk test done at initial orientation visit.  The plan for exercise progression was also introduced and progression will be customized based on patient's performance and goals.          Exercise Goals and Review:   Exercise Goals     Row Name 10/31/23 1510             Exercise Goals   Increase Physical Activity Yes       Intervention Provide advice, education, support and counseling about physical activity/exercise needs.;Develop an individualized exercise prescription for aerobic and resistive training based on initial evaluation findings, risk stratification, comorbidities and  participant's personal goals.       Expected Outcomes Short Term: Attend rehab on a regular basis to increase amount of physical activity.;Long Term: Exercising regularly at least 3-5 days a week.;Long Term: Add in home exercise to make exercise part of routine and to increase amount of physical activity.       Increase Strength and Stamina Yes       Intervention Provide advice, education, support and counseling about physical activity/exercise needs.;Develop an individualized exercise prescription for aerobic and resistive training based on initial evaluation findings, risk stratification, comorbidities and participant's personal goals.       Expected Outcomes Short Term: Increase workloads from initial exercise prescription for resistance, speed, and METs.;Short Term: Perform resistance training exercises routinely during rehab and add in resistance training at home;Long Term: Improve cardiorespiratory fitness, muscular endurance and strength as measured by increased METs and functional capacity  ( )       Able to understand and use rate of perceived exertion (RPE) scale Yes       Intervention Provide education and explanation on how to use RPE scale       Expected Outcomes Short Term: Able to use RPE daily in rehab to express subjective intensity level;Long Term:  Able to use RPE to guide intensity level when exercising independently       Able to understand and use Dyspnea scale Yes       Intervention Provide education and explanation on how to use Dyspnea scale       Expected Outcomes Short Term: Able to use Dyspnea scale daily in rehab to express subjective sense of shortness of breath during exertion;Long Term: Able to use Dyspnea scale to guide intensity level when exercising independently       Knowledge and understanding of Target Heart Rate Range (THRR) Yes       Intervention Provide education and explanation of THRR including how the numbers were predicted and where they are located for reference       Expected Outcomes Short Term: Able to state/look up THRR;Long Term: Able to use THRR to govern intensity when exercising independently;Short Term: Able to use daily as guideline for intensity in rehab       Able to check pulse independently Yes       Intervention Provide education and demonstration on how to check pulse in carotid and radial arteries.;Review the importance of being able to check your own pulse for safety during independent exercise       Expected Outcomes Short Term: Able to explain why pulse checking is important during independent exercise;Long Term: Able to check pulse independently and accurately       Understanding of Exercise Prescription Yes       Intervention Provide education, explanation, and written materials on patient's individual exercise prescription       Expected Outcomes Short Term: Able to explain program exercise prescription;Long Term: Able to explain home exercise prescription to exercise independently          Exercise Goals Re-Evaluation  :  Exercise Goals Re-Evaluation     Row Name 12/11/23 1443             Exercise Goal Re-Evaluation   Exercise Goals Review Increase Physical Activity;Increase Strength and Stamina;Able to understand and use rate of perceived exertion (RPE) scale;Knowledge and understanding of Target Heart Rate Range (THRR);Understanding of Exercise Prescription;Able to check pulse independently       Comments [name] is doing great in rehab. She does  notes she is not excercising at home at all. I asked if there was anywhere she could walk at home and she noted that [place] that she could walk.       Expected Outcomes Short: continue to attend rehab. Long: Walking everyday for at least 30 minutes.           Discharge Exercise Prescription (Final Exercise Prescription Changes):  Exercise Prescription Changes - 12/04/23 1500       Response to Exercise   Blood Pressure (Admit) 120/64    Blood Pressure (Exercise) 136/82    Blood Pressure (Exit) 110/70    Heart Rate (Admit) 75 bpm    Heart Rate (Exercise) 117 bpm    Heart Rate (Exit) 90 bpm    Rating of Perceived Exertion (Exercise) 15    Duration Continue with 30 min of aerobic exercise without signs/symptoms of physical distress.    Intensity THRR unchanged      Progression   Progression Continue to progress workloads to maintain intensity without signs/symptoms of physical distress.      Resistance Training   Training Prescription Yes    Weight 3    Reps 10-15      Treadmill   MPH 1.2    Grade 0    Minutes 15    METs 1.92      NuStep   Level 2    SPM 38    Minutes 15    METs 1.8          Nutrition:  Target Goals: Understanding of nutrition guidelines, daily intake of sodium 1500mg , cholesterol 200mg , calories 30% from fat and 7% or less from saturated fats, daily to have 5 or more servings of fruits and vegetables.  Biometrics:  Pre Biometrics - 10/31/23 1510       Pre Biometrics   Height 5' 0.5 (1.537  m)    Weight 158 lb (71.7 kg)    Waist Circumference 35.25 inches    Hip Circumference 41.25 inches    Waist to Hip Ratio 0.85 %    BMI (Calculated) 30.34    Grip Strength 9.1 kg    Single Leg Stand 4 seconds           Nutrition Therapy Plan and Nutrition Goals:  Nutrition Therapy & Goals - 10/31/23 1515       Intervention Plan   Intervention Prescribe, educate and counsel regarding individualized specific dietary modifications aiming towards targeted core components such as weight, hypertension, lipid management, diabetes, heart failure and other comorbidities.;Nutrition handout(s) given to patient.    Expected Outcomes Short Term Goal: Understand basic principles of dietary content, such as calories, fat, sodium, cholesterol and nutrients.;Long Term Goal: Adherence to prescribed nutrition plan.          Nutrition Assessments:  MEDIFICTS Score Key: >=70 Need to make dietary changes  40-70 Heart Healthy Diet <= 40 Therapeutic Level Cholesterol Diet  Flowsheet Row CARDIAC REHAB PHASE II ORIENTATION from 10/31/2023 in [facility] CARDIAC REHABILITATION  Picture Your Plate Total Score on Admission 60   Picture Your Plate Scores: <59 Unhealthy dietary pattern with much room for improvement. 41-50 Dietary pattern unlikely to meet recommendations for good health and room for improvement. 51-60 More healthful dietary pattern, with some room for improvement.  >60 Healthy dietary pattern, although there may be some specific behaviors that could be improved.    Nutrition Goals Re-Evaluation:  Nutrition Goals Re-Evaluation     Row Name 12/11/23  1435             Goals   Comment [name] notes she is eating pretty good. She says she is watching her salt and sugar intake due to her high b/p and diabeties.       Expected Outcome Short: continue to monitor salts and sugars in diet. Long: maintain healthy diet and eat well balanced meals.          Nutrition Goals Discharge  (Final Nutrition Goals Re-Evaluation):  Nutrition Goals Re-Evaluation - 12/11/23 1435       Goals   Comment [name] notes she is eating pretty good. She says she is watching her salt and sugar intake due to her high b/p and diabeties.    Expected Outcome Short: continue to monitor salts and sugars in diet. Long: maintain healthy diet and eat well balanced meals.          Psychosocial: Target Goals: Acknowledge presence or absence of significant depression and/or stress, maximize coping skills, provide positive support system. Participant is able to verbalize types and ability to use techniques and skills needed for reducing stress and depression.  Initial Review & Psychosocial Screening:  Initial Psych Review & Screening - 10/31/23 1418       Initial Review   Current issues with History of Depression;Current Stress Concerns    Source of Stress Concerns Unable to participate in former interests or hobbies;Unable to perform yard/household activities;Financial    Comments In process of moving.  Was living with daughter in [place], moving to [place] to be closer to work      [employer]   Good Support System? Yes   Daughter, friends from church     Barriers   Psychosocial barriers to participate in program Psychosocial barriers identified (see note);The patient should benefit from training in stress management and relaxation.      Screening Interventions   Interventions Encouraged to exercise;To provide support and resources with identified psychosocial needs;Provide feedback about the scores to participant    Expected Outcomes Short Term goal: Utilizing psychosocial counselor, staff and physician to assist with identification of specific Stressors or current issues interfering with healing process. Setting desired goal for each stressor or current issue identified.;Long Term Goal: Stressors or current issues are controlled or eliminated.;Short Term goal: Identification and review  with participant of any Quality of Life or Depression concerns found by scoring the questionnaire.;Long Term goal: The participant improves quality of Life and PHQ9 Scores as seen by post scores and/or verbalization of changes          Quality of Life Scores:  Quality of Life - 10/31/23 1514       Quality of Life   Select Quality of Life      Quality of Life Scores   Health/Function Pre 22.17 %    Socioeconomic Pre 19 %    Psych/Spiritual Pre 22.71 %    Family Pre 25.8 %    GLOBAL Pre 22.07 %         Scores of 19 and below usually indicate a poorer quality of life in these areas.  A difference of  2-3 points is a clinically meaningful difference.  A difference of 2-3 points in the total score of the Quality of Life Index has been associated with significant improvement in overall quality of life, self-image, physical symptoms, and general health in studies assessing change in quality of life.  PHQ-9: Review Flowsheet  More data exists  11/27/2023 10/31/2023 11/16/2022 09/19/2022 09/28/2021  Depression screen PHQ 2/9  Decreased Interest 1 1 0 0 1  Down, Depressed, Hopeless 1 1 0 0 1  PHQ - 2 Score 2 2 0 0 2  Altered sleeping 1 0 1 - 0  Tired, decreased energy 2 3 1  - 1  Change in appetite 0 0 0 - 1  Feeling bad or failure about yourself  1 1 0 - 0  Trouble concentrating 1 1 0 - 1  Moving slowly or fidgety/restless 0 0 0 - 0  Suicidal thoughts 0 0 0 - 0  PHQ-9 Score 7 7 2  - 5  Difficult doing work/chores Somewhat difficult Somewhat difficult Not difficult at all - Somewhat difficult   Interpretation of Total Score  Total Score Depression Severity:  1-4 = Minimal depression, 5-9 = Mild depression, 10-14 = Moderate depression, 15-19 = Moderately severe depression, 20-27 = Severe depression   Psychosocial Evaluation and Intervention:  Psychosocial Evaluation - 10/31/23 1511       Psychosocial Evaluation & Interventions   Comments [name] is coming into cardiac rehab after  having a stent placed in June.  She is also in the middle of moving from [place] to [place] to be closer to work.  She was living with her daughter (her biggest supporter) but wanted to move out to be independent and close to work since she goes in 4 days a week. 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  She is eager to build her strength and stamina back up. She wants to get into a routine for herself again.  She usually sleeps well.  She does not precieve any issues with getting to rehab reguarly.  She does have a copay and will let us  know if it gets to be too much. She has a history of meds but is no longer needing them.  She is also off of her type 2 DM meds which she is very proud of herself.    Expected Outcomes Short: Attend rehab to build up stamina. Long: Continue to improve strength    Continue Psychosocial Services  Follow up required by staff          Psychosocial Re-Evaluation:  Psychosocial Re-Evaluation     Row Name 12/11/23 1432             Psychosocial Re-Evaluation   Current issues with Current Sleep Concerns;Current Stress Concerns       Comments [name] notes she is in the middle of moving, she is moving out of her daughters house into a house to herself, so she is having some stress from that. She also notes she wakes up 2-3 times a night and she thinks it is her b/p meds causing this.       Expected Outcomes Short: speak with her PCP about her b/p meds and her sleeping concerns. Long: continue to have healthy stress outlets.       Interventions Relaxation education;Encouraged to attend Cardiac Rehabilitation for the exercise;Stress management education       Continue Psychosocial Services  Follow up required by staff          Psychosocial Discharge (Final Psychosocial Re-Evaluation):  Psychosocial Re-Evaluation - 12/11/23 1432       Psychosocial Re-Evaluation   Current issues with Current Sleep Concerns;Current Stress Concerns    Comments  [name] notes she is in the middle of moving, she is moving out of her daughters house into a house  to herself, so she is having some stress from that. She also notes she wakes up 2-3 times a night and she thinks it is her b/p meds causing this.    Expected Outcomes Short: speak with her PCP about her b/p meds and her sleeping concerns. Long: continue to have healthy stress outlets.    Interventions Relaxation education;Encouraged to attend Cardiac Rehabilitation for the exercise;Stress management education    Continue Psychosocial Services  Follow up required by staff          Vocational Rehabilitation: Provide vocational rehab assistance to qualifying candidates.   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 Participant will state understanding/return demonstration of topics presented.  Learning Barriers/Preferences:  Learning Barriers/Preferences - 10/31/23 1416       Learning Barriers/Preferences   Learning Barriers None    Learning Preferences None          Education Topics: Hypertension, Hypertension Reduction -Define heart disease and high blood pressure. Discus how high blood pressure affects the body and ways to reduce high blood pressure.   Exercise and Your Heart -Discuss why it is important to exercise, the FITT principles of exercise, normal and abnormal responses to exercise, and how to exercise safely.   Angina -Discuss definition of angina, causes of angina, treatment of angina, and how to decrease risk of having angina.   Cardiac Medications -Review what the following cardiac medications are used for, how they affect the body, and side effects that may  occur when taking the medications.  Medications include Aspirin , Beta blockers, calcium  channel blockers, ACE Inhibitors, angiotensin receptor blockers, diuretics, digoxin, and antihyperlipidemics. Flowsheet Row CARDIAC REHAB PHASE II EXERCISE from 11/08/2023 in [facility] CARDIAC REHABILITATION  Date 11/08/23  Educator [facility]  Instruction Review Code 1- Verbalizes Understanding    Congestive Heart Failure -Discuss the definition of CHF, how to live with CHF, the signs and symptoms of CHF, and how keep track of weight and sodium intake.   Heart Disease and Intimacy -Discus the effect sexual activity has on the heart, how changes occur during intimacy as we age, and safety during sexual activity.   Smoking Cessation / COPD -Discuss different methods to quit smoking, the health benefits of quitting smoking, and the definition of COPD.   Nutrition I: Fats -Discuss the types of cholesterol, what cholesterol does to the heart, and how cholesterol levels can be controlled.   Nutrition II: Labels -Discuss the different components of food labels and how to read food label   Heart Parts/Heart Disease and PAD -Discuss the anatomy of the heart, the pathway of blood circulation through the heart, and these are affected by heart disease.   Stress I: Signs and Symptoms -Discuss the causes of stress, how stress may lead to anxiety and depression, and ways to limit stress.   Stress II: Relaxation -Discuss different types of relaxation techniques to limit stress.   Warning Signs of Stroke / TIA -Discuss definition of a stroke, what the signs and symptoms are of a stroke, and how to identify when someone is having stroke.   Knowledge Questionnaire Score:  Knowledge Questionnaire Score - 10/31/23 1515  Knowledge Questionnaire Score   Pre Score 22/26          Core Components/Risk Factors/Patient Goals at Admission:  Personal Goals and Risk Factors at Admission - 10/31/23 1515        Core Components/Risk Factors/Patient Goals on Admission    Weight Management Yes;Weight Loss;Obesity    Intervention Weight Management: Develop a combined nutrition and exercise program designed to reach desired caloric intake, while maintaining appropriate intake of nutrient and fiber, sodium and fats, and appropriate energy expenditure required for the weight goal.;Weight Management: Provide education and appropriate resources to help participant work on and attain dietary goals.;Weight Management/Obesity: Establish reasonable short term and long term weight goals.;Obesity: Provide education and appropriate resources to help participant work on and attain dietary goals.    Admit Weight 158 lb (71.7 kg)    Goal Weight: Short Term 155 lb (70.3 kg)    Goal Weight: Long Term 150 lb (68 kg)    Expected Outcomes Short Term: Continue to assess and modify interventions until short term weight is achieved;Long Term: Adherence to nutrition and physical activity/exercise program aimed toward attainment of established weight goal;Weight Loss: Understanding of general recommendations for a balanced deficit meal plan, which promotes 1-2 lb weight loss per week and includes a negative energy balance of [phone] kcal/d;Understanding recommendations for meals to include 15-35% energy as protein, 25-35% energy from fat, 35-60% energy from carbohydrates, less than 200mg  of dietary cholesterol, 20-35 gm of total fiber daily;Understanding of distribution of calorie intake throughout the day with the consumption of 4-5 meals/snacks    Improve shortness of breath with ADL's Yes    Intervention Provide education, individualized exercise plan and daily activity instruction to help decrease symptoms of SOB with activities of daily living.    Expected Outcomes Short Term: Improve cardiorespiratory fitness to achieve a reduction of symptoms when performing ADLs;Long Term: Be able to perform more ADLs without symptoms or delay the  onset of symptoms    Diabetes Yes   no longer on meds or checking sugars   Intervention Provide education about signs/symptoms and action to take for hypo/hyperglycemia.;Provide education about proper nutrition, including hydration, and aerobic/resistive exercise prescription along with prescribed medications to achieve blood glucose in normal ranges: Fasting glucose 65-99 mg/dL    Expected Outcomes Short Term: Participant verbalizes understanding of the signs/symptoms and immediate care of hyper/hypoglycemia, proper foot care and importance of medication, aerobic/resistive exercise and nutrition plan for blood glucose control.    Heart Failure Yes    Intervention Provide a combined exercise and nutrition program that is supplemented with education, support and counseling about heart failure. Directed toward relieving symptoms such as shortness of breath, decreased exercise tolerance, and extremity edema.    Expected Outcomes Improve functional capacity of life;Short term: Attendance in program 2-3 days a week with increased exercise capacity. Reported lower sodium intake. Reported increased fruit and vegetable intake. Reports medication compliance.;Short term: Daily weights obtained and reported for increase. Utilizing diuretic protocols set by physician.;Long term: Adoption of self-care skills and reduction of barriers for early signs and symptoms recognition and intervention leading to self-care maintenance.    Hypertension Yes    Intervention Provide education on lifestyle modifcations including regular physical activity/exercise, weight management, moderate sodium restriction and increased consumption of fresh fruit, vegetables, and low fat dairy, alcohol  moderation, and smoking cessation.;Monitor prescription use compliance.    Expected Outcomes Short Term: Continued assessment and intervention until BP is < 140/35mm HG in hypertensive participants. < 130/48mm  HG in hypertensive participants with  diabetes, heart failure or chronic kidney disease.;Long Term: Maintenance of blood pressure at goal levels.    Lipids Yes    Intervention Provide education and support for participant on nutrition & aerobic/resistive exercise along with prescribed medications to achieve LDL 70mg , HDL >40mg .    Expected Outcomes Short Term: Participant states understanding of desired cholesterol values and is compliant with medications prescribed. Participant is following exercise prescription and nutrition guidelines.;Long Term: Cholesterol controlled with medications as prescribed, with individualized exercise RX and with personalized nutrition plan. Value goals: LDL < 70mg , HDL > 40 mg.          Core Components/Risk Factors/Patient Goals Review:   Goals and Risk Factor Review     Row Name 12/11/23 1438             Core Components/Risk Factors/Patient Goals Review   Personal Goals Review Hypertension;Diabetes       Review Aveah notes she is on 3 different b/p meds. Also notes she has type 2 diabeties and she is on no medication for,she also does not check her sugar at home. I mentioned to her to speak with her PCP about checking her A1C.       Expected Outcomes Short: communicate with PCP about checking her A1C. Long: continue to follow up with her PCP and follow medication prescriptions.          Core Components/Risk Factors/Patient Goals at Discharge (Final Review):   Goals and Risk Factor Review - 12/11/23 1438       Core Components/Risk Factors/Patient Goals Review   Personal Goals Review Hypertension;Diabetes    Review [name] notes she is on 3 different b/p meds. Also notes she has type 2 diabeties and she is on no medication for,she also does not check her sugar at home. I mentioned to her to speak with her PCP about checking her A1C.    Expected Outcomes Short: communicate with PCP about checking her A1C. Long: continue to follow up with her PCP and follow medication prescriptions.           ITP Comments:  ITP Comments     Row Name 10/31/23 1503 11/06/23 1337 11/21/23 1248 12/19/23 1519     ITP Comments Patient attend orientation today.  Patient is attending Cardiac Rehabilitation Program.  Documentation for diagnosis can be found in 10/05/23.  Reviewed medical chart, RPE/RPD, gym safety, and program guidelines.  Patient was fitted to equipment they will be using during rehab.  Patient is scheduled to start exercise on Tuesday 11/06/23 at 1330.   Initial ITP created and sent for review and signature by Dr. Dorn Ross, Medical Director for Cardiac Rehabilitation Program. First full day of exercise!  Patient was oriented to gym and equipment including functions, settings, policies, and procedures.  Patient's individual exercise prescription and treatment plan were reviewed.  All starting workloads were established based on the results of the 6 minute walk test done at initial orientation visit.  The plan for exercise progression was also introduced and progression will be customized based on patient's performance and goals. 30 day review completed. ITP sent to Dr. Dorn Ross, Medical Director of Cardiac Rehab. Continue with ITP unless changes are made by physician.  Newer to program. 30 day review completed. ITP sent to Dr. Dorn Ross, Medical Director of Cardiac Rehab. Continue with ITP unless changes are made by physician.       Comments: 30 day review

## 2023-12-20 ENCOUNTER — Encounter: Payer: Self-pay | Admitting: Cardiology

## 2023-12-20 ENCOUNTER — Other Ambulatory Visit (HOSPITAL_COMMUNITY): Payer: Self-pay

## 2023-12-20 ENCOUNTER — Encounter (HOSPITAL_COMMUNITY)

## 2023-12-20 ENCOUNTER — Ambulatory Visit: Attending: Cardiology | Admitting: Cardiology

## 2023-12-20 VITALS — BP 180/83 | HR 80 | Ht 64.0 in | Wt 158.0 lb

## 2023-12-20 DIAGNOSIS — E785 Hyperlipidemia, unspecified: Secondary | ICD-10-CM | POA: Diagnosis not present

## 2023-12-20 DIAGNOSIS — I428 Other cardiomyopathies: Secondary | ICD-10-CM

## 2023-12-20 DIAGNOSIS — I2510 Atherosclerotic heart disease of native coronary artery without angina pectoris: Secondary | ICD-10-CM | POA: Diagnosis not present

## 2023-12-20 DIAGNOSIS — Z86718 Personal history of other venous thrombosis and embolism: Secondary | ICD-10-CM | POA: Diagnosis not present

## 2023-12-20 MED ORDER — ISOSORBIDE MONONITRATE ER 30 MG PO TB24
30.0000 mg | ORAL_TABLET | Freq: Every day | ORAL | 3 refills | Status: DC
  Filled 2023-12-20: qty 90, 90d supply, fill #0

## 2023-12-20 NOTE — Patient Instructions (Signed)
 Medication Instructions:  Start Isosorbide  mononitrate 30 mg once daily *If you need a refill on your cardiac medications before your next appointment, please call your pharmacy*  Lab Work: NONE If you have labs (blood work) drawn today and your tests are completely normal, you will receive your results only by: MyChart Message (if you have MyChart) OR A paper copy in the mail If you have any lab test that is abnormal or we need to change your treatment, we will call you to review the results.  Testing/Procedures: NONE  Follow-Up: At [facility], you and your health needs are our priority.  As part of our continuing mission to provide you with exceptional heart care, our providers are all part of one team.  This team includes your primary Cardiologist (physician) and Advanced Practice Providers or APPs (Physician Assistants and Nurse Practitioners) who all work together to provide you with the care you need, when you need it.  Your next appointment:   3 months in [place]  Provider:   Lav[place], MD  We recommend signing up for the patient portal called MyChart.  Sign up information is provided on this After Visit Summary.  MyChart is used to connect with patients for Virtual Visits (Telemedicine).  Patients are able to view lab/test results, encounter notes, upcoming appointments, etc.  Non-urgent messages can be sent to your provider as well.   To learn more about what you can do with MyChart, go to [email or web address].

## 2023-12-25 ENCOUNTER — Encounter (HOSPITAL_COMMUNITY)

## 2023-12-25 ENCOUNTER — Encounter (HOSPITAL_COMMUNITY)
Admission: RE | Admit: 2023-12-25 | Discharge: 2023-12-25 | Disposition: A | Discharge status: Home / Self Care | Source: Ambulatory Visit | Attending: Cardiology | Admitting: Cardiology

## 2023-12-25 DIAGNOSIS — Z955 Presence of coronary angioplasty implant and graft: Secondary | ICD-10-CM | POA: Diagnosis not present

## 2023-12-25 NOTE — Progress Notes (Signed)
 Daily Session Note  Patient Details  Name: [name] MRN: [id number] Date of Birth: [birth year] Referring Provider:   Flowsheet Row CARDIAC REHAB PHASE II ORIENTATION from 10/31/2023 in [facility] CARDIAC REHABILITATION  Referring Provider Lavona Agent MD    Encounter Date: 12/25/2023  Check In:  Session Check In - 12/25/23 1501       Check-In   Supervising physician immediately available to respond to emergencies See telemetry face sheet for immediately available MD    Location [facility]    Staff Present Powell Benders, BS, Exercise Physiologist;Brittany Jackquline, BSN, RN, WTA-C;Karrissa Parchment Zina, RN    Virtual Visit No    Medication changes reported     No    Fall or balance concerns reported    No    Warm-up and Cool-down Performed on first and last piece of equipment    Resistance Training Performed Yes    VAD Patient? No    PAD/SET Patient? No      Pain Assessment   Currently in Pain? No/denies          Capillary Blood Glucose: No results found for this or any previous visit (from the past 24 hours).    Social History   Tobacco Use  Smoking Status Never  Smokeless Tobacco Never    Goals Met:  Independence with exercise equipment Exercise tolerated well No report of concerns or symptoms today Strength training completed today  Goals Unmet:  Not Applicable  Comments: Pt able to follow exercise prescription today without complaint.  Will continue to monitor for progression.

## 2023-12-27 ENCOUNTER — Encounter (HOSPITAL_COMMUNITY)

## 2023-12-31 ENCOUNTER — Encounter: Payer: Self-pay | Admitting: Family

## 2023-12-31 ENCOUNTER — Ambulatory Visit: Admitting: Family

## 2023-12-31 VITALS — BP 172/76 | HR 78 | Temp 97.8°F | Ht 64.0 in | Wt 161.8 lb

## 2023-12-31 DIAGNOSIS — K219 Gastro-esophageal reflux disease without esophagitis: Secondary | ICD-10-CM

## 2023-12-31 DIAGNOSIS — Z0001 Encounter for general adult medical examination with abnormal findings: Secondary | ICD-10-CM | POA: Diagnosis not present

## 2023-12-31 DIAGNOSIS — E785 Hyperlipidemia, unspecified: Secondary | ICD-10-CM | POA: Diagnosis not present

## 2023-12-31 DIAGNOSIS — E1169 Type 2 diabetes mellitus with other specified complication: Secondary | ICD-10-CM

## 2023-12-31 DIAGNOSIS — I5032 Chronic diastolic (congestive) heart failure: Secondary | ICD-10-CM

## 2023-12-31 DIAGNOSIS — Z0000 Encounter for general adult medical examination without abnormal findings: Secondary | ICD-10-CM

## 2023-12-31 DIAGNOSIS — I2510 Atherosclerotic heart disease of native coronary artery without angina pectoris: Secondary | ICD-10-CM

## 2023-12-31 DIAGNOSIS — I700 Atherosclerosis of aorta: Secondary | ICD-10-CM

## 2023-12-31 DIAGNOSIS — I6930 Unspecified sequelae of cerebral infarction: Secondary | ICD-10-CM

## 2023-12-31 DIAGNOSIS — I209 Angina pectoris, unspecified: Secondary | ICD-10-CM

## 2023-12-31 DIAGNOSIS — J4520 Mild intermittent asthma, uncomplicated: Secondary | ICD-10-CM | POA: Diagnosis not present

## 2023-12-31 DIAGNOSIS — Z86718 Personal history of other venous thrombosis and embolism: Secondary | ICD-10-CM | POA: Diagnosis not present

## 2023-12-31 DIAGNOSIS — I10 Essential (primary) hypertension: Secondary | ICD-10-CM

## 2023-12-31 LAB — LIPID PANEL

## 2023-12-31 LAB — BAYER DCA HB A1C WAIVED: HB A1C (BAYER DCA - WAIVED): 6.8 % — ABNORMAL HIGH (ref 4.8–5.6)

## 2023-12-31 MED ORDER — OMEPRAZOLE 20 MG PO CPDR: 20.0000 mg | DELAYED_RELEASE_CAPSULE | Freq: Every day | ORAL | 1 refills | Status: AC

## 2023-12-31 NOTE — Patient Instructions (Signed)
 Hypertension, Adult High blood pressure (hypertension) is when the force of blood pumping through the arteries is too strong. The arteries are the blood vessels that carry blood from the heart throughout the body. Hypertension forces the heart to work harder to pump blood and may cause arteries to become narrow or stiff. Untreated or uncontrolled hypertension can lead to a heart attack, heart failure, a stroke, kidney disease, and other problems. A blood pressure reading consists of a higher number over a lower number. Ideally, your blood pressure should be below 120/80. The first ("top") number is called the systolic pressure. It is a measure of the pressure in your arteries as your heart beats. The second ("bottom") number is called the diastolic pressure. It is a measure of the pressure in your arteries as the heart relaxes. What are the causes? The exact cause of this condition is not known. There are some conditions that result in high blood pressure. What increases the risk? Certain factors may make you more likely to develop high blood pressure. Some of these risk factors are under your control, including: Smoking. Not getting enough exercise or physical activity. Being overweight. Having too much fat, sugar, calories, or salt (sodium) in your diet. Drinking too much alcohol. Other risk factors include: Having a personal history of heart disease, diabetes, high cholesterol, or kidney disease. Stress. Having a family history of high blood pressure and high cholesterol. Having obstructive sleep apnea. Age. The risk increases with age. What are the signs or symptoms? High blood pressure may not cause symptoms. Very high blood pressure (hypertensive crisis) may cause: Headache. Fast or irregular heartbeats (palpitations). Shortness of breath. Nosebleed. Nausea and vomiting. Vision changes. Severe chest pain, dizziness, and seizures. How is this diagnosed? This condition is diagnosed by  measuring your blood pressure while you are seated, with your arm resting on a flat surface, your legs uncrossed, and your feet flat on the floor. The cuff of the blood pressure monitor will be placed directly against the skin of your upper arm at the level of your heart. Blood pressure should be measured at least twice using the same arm. Certain conditions can cause a difference in blood pressure between your right and left arms. If you have a high blood pressure reading during one visit or you have normal blood pressure with other risk factors, you may be asked to: Return on a different day to have your blood pressure checked again. Monitor your blood pressure at home for 1 week or longer. If you are diagnosed with hypertension, you may have other blood or imaging tests to help your health care provider understand your overall risk for other conditions. How is this treated? This condition is treated by making healthy lifestyle changes, such as eating healthy foods, exercising more, and reducing your alcohol intake. You may be referred for counseling on a healthy diet and physical activity. Your health care provider may prescribe medicine if lifestyle changes are not enough to get your blood pressure under control and if: Your systolic blood pressure is above 130. Your diastolic blood pressure is above 80. Your personal target blood pressure may vary depending on your medical conditions, your age, and other factors. Follow these instructions at home: Eating and drinking  Eat a diet that is high in fiber and potassium, and low in sodium, added sugar, and fat. An example of this eating plan is called the DASH diet. DASH stands for Dietary Approaches to Stop Hypertension. To eat this way: Eat  plenty of fresh fruits and vegetables. Try to fill one half of your plate at each meal with fruits and vegetables. Eat whole grains, such as whole-wheat pasta, brown rice, or whole-grain bread. Fill about one  fourth of your plate with whole grains. Eat or drink low-fat dairy products, such as skim milk or low-fat yogurt. Avoid fatty cuts of meat, processed or cured meats, and poultry with skin. Fill about one fourth of your plate with lean proteins, such as fish, chicken without skin, beans, eggs, or tofu. Avoid pre-made and processed foods. These tend to be higher in sodium, added sugar, and fat. Reduce your daily sodium intake. Many people with hypertension should eat less than 1,500 mg of sodium a day. Do not drink alcohol if: Your health care provider tells you not to drink. You are pregnant, may be pregnant, or are planning to become pregnant. If you drink alcohol: Limit how much you have to: 0-1 drink a day for women. 0-2 drinks a day for men. Know how much alcohol is in your drink. In the [place] one drink equals one 12 oz bottle of beer (355 mL), one 5 oz glass of wine (148 mL), or one 1 oz glass of hard liquor (44 mL). Lifestyle  Work with your health care provider to maintain a healthy body weight or to lose weight. Ask what an ideal weight is for you. Get at least 30 minutes of exercise that causes your heart to beat faster (aerobic exercise) most days of the week. Activities may include walking, swimming, or biking. Include exercise to strengthen your muscles (resistance exercise), such as Pilates or lifting weights, as part of your weekly exercise routine. Try to do these types of exercises for 30 minutes at least 3 days a week. Do not use any products that contain nicotine or tobacco. These products include cigarettes, chewing tobacco, and vaping devices, such as e-cigarettes. If you need help quitting, ask your health care provider. Monitor your blood pressure at home as told by your health care provider. Keep all follow-up visits. This is important. Medicines Take over-the-counter and prescription medicines only as told by your health care provider. Follow directions carefully. Blood  pressure medicines must be taken as prescribed. Do not skip doses of blood pressure medicine. Doing this puts you at risk for problems and can make the medicine less effective. Ask your health care provider about side effects or reactions to medicines that you should watch for. Contact a health care provider if you: Think you are having a reaction to a medicine you are taking. Have headaches that keep coming back (recurring). Feel dizzy. Have swelling in your ankles. Have trouble with your vision. Get help right away if you: Develop a severe headache or confusion. Have unusual weakness or numbness. Feel faint. Have severe pain in your chest or abdomen. Vomit repeatedly. Have trouble breathing. These symptoms may be an emergency. Get help right away. Call 911. Do not wait to see if the symptoms will go away. Do not drive yourself to the hospital. Summary Hypertension is when the force of blood pumping through your arteries is too strong. If this condition is not controlled, it may put you at risk for serious complications. Your personal target blood pressure may vary depending on your medical conditions, your age, and other factors. For most people, a normal blood pressure is less than 120/80. Hypertension is treated with lifestyle changes, medicines, or a combination of both. Lifestyle changes include losing weight, eating a healthy,  low-sodium diet, exercising more, and limiting alcohol. This information is not intended to replace advice given to you by your health care provider. Make sure you discuss any questions you have with your health care provider. Document Revised: 02/01/2021 Document Reviewed: 02/01/2021 Elsevier Patient Education  2024 [employer].

## 2023-12-31 NOTE — Progress Notes (Signed)
 Subjective:    Patient ID: [name], female    DOB: 1946/03/22, 78 y.o.   MRN: [id number]  Chief Complaint  Patient presents with   Follow-up    Had stents put in in July. Seen cardiology Friday    Pt presets to the office today for chronic follow up.   Has hx of CVA approx 2011 and has left sided weakness with left facial drooping.   She is followed by Cardiologists for CAD and angina.   Has Aortic atherosclerosis and takes Crestor  40 mg daily.   Has hx of DVT and taking Eliquis  5 mg BID.    She is also complaining of urinary incontinence.  Hypertension This is a chronic problem. The current episode started more than 1 year ago. The problem has been waxing and waning since onset. The problem is uncontrolled. Associated symptoms include malaise/fatigue and peripheral edema (slightly). Pertinent negatives include no blurred vision or shortness of breath. Risk factors for coronary artery disease include dyslipidemia and sedentary lifestyle. Past treatments include calcium  channel blockers and beta blockers. The current treatment provides mild improvement. Hypertensive end-organ damage includes CAD/MI and CVA.  Hyperlipidemia This is a chronic problem. The current episode started more than 1 year ago. Pertinent negatives include no shortness of breath. Current antihyperlipidemic treatment includes statins. The current treatment provides mild improvement of lipids. Risk factors for coronary artery disease include dyslipidemia, hypertension, a sedentary lifestyle and post-menopausal.  Diabetes She presents for her follow-up diabetic visit. She has type 2 diabetes mellitus. Associated symptoms include fatigue. Pertinent negatives for diabetes include no blurred vision and no foot paresthesias. Diabetic complications include a CVA. Risk factors for coronary artery disease include dyslipidemia, diabetes mellitus, hypertension, sedentary lifestyle and post-menopausal. She is following a  generally healthy diet. (Does not check glucose at home)  Gastroesophageal Reflux She complains of belching, coughing and heartburn. She reports no wheezing. This is a chronic problem. The current episode started more than 1 year ago. The problem occurs occasionally. The problem has been waxing and waning. The symptoms are aggravated by certain foods. Associated symptoms include fatigue. She has tried a diet change for the symptoms. The treatment provided mild relief.  Asthma She complains of cough. There is no shortness of breath or wheezing. The current episode started more than 1 year ago. The problem occurs intermittently. Associated symptoms include heartburn and malaise/fatigue. Her past medical history is significant for asthma.  Congestive Heart Failure Presents for follow-up visit. Associated symptoms include edema and fatigue. Pertinent negatives include no shortness of breath. The symptoms have been stable.      Review of Systems  Constitutional:  Positive for fatigue and malaise/fatigue.  Eyes:  Negative for blurred vision.  Respiratory:  Positive for cough. Negative for shortness of breath and wheezing.   Gastrointestinal:  Positive for heartburn.  All other systems reviewed and are negative.      Objective:   Physical Exam Vitals reviewed.  Constitutional:      General: She is not in acute distress.    Appearance: She is well-developed. She is obese.  HENT:     Head: Normocephalic and atraumatic.     Comments: Left drooping of left eye and face    Right Ear: Tympanic membrane normal.     Left Ear: Tympanic membrane normal.  Eyes:     Pupils: Pupils are equal, round, and reactive to light.  Neck:     Thyroid : No thyromegaly.  Cardiovascular:  Rate and Rhythm: Normal rate and regular rhythm.     Heart sounds: Normal heart sounds. No murmur heard. Pulmonary:     Effort: Pulmonary effort is normal. No respiratory distress.     Breath sounds: Normal breath sounds.  No wheezing.  Abdominal:     General: Bowel sounds are normal. There is no distension.     Palpations: Abdomen is soft.     Tenderness: There is no abdominal tenderness.  Musculoskeletal:        General: No tenderness. Normal range of motion.     Cervical back: Normal range of motion and neck supple.     Right lower leg: Edema (2+) present.     Left lower leg: Edema (trace) present.  Skin:    General: Skin is warm and dry.  Neurological:     Mental Status: She is alert and oriented to person, place, and time.     Cranial Nerves: No cranial nerve deficit.     Deep Tendon Reflexes: Reflexes are normal and symmetric.  Psychiatric:        Behavior: Behavior normal.        Thought Content: Thought content normal.        Judgment: Judgment normal.       BP (!) 172/76   Pulse 78   Temp 97.8 F (36.6 C) (Temporal)   Ht 5' 4 (1.626 m)   Wt 161 lb 12.8 oz (73.4 kg)   LMP 01/22/1981 (LMP Unknown)   BMI 27.77 kg/m      Assessment & Plan:  [name] comes in today with chief complaint of Follow-up (Had stents put in in July. Seen cardiology Friday )   Diagnosis and orders addressed: 1. Annual physical exam (Primary) - Bayer DCA Hb A1c Waived - CMP14+EGFR - CBC with Differential/Platelet - Lipid panel - Microalbumin / creatinine urine ratio - TSH - Vitamin B12  2. Essential hypertension - CMP14+EGFR - CBC with Differential/Platelet  3. Mild intermittent asthma, unspecified whether complicated - CMP14+EGFR - CBC with Differential/Platelet  4. Gastroesophageal reflux disease, unspecified whether esophagitis present - CMP14+EGFR - CBC with Differential/Platelet - omeprazole  (PRILOSEC) 20 MG capsule; Take 1 capsule (20 mg total) by mouth daily.  Dispense: 90 capsule; Refill: 1  5. Late effect of cerebrovascular accident (CVA) - CMP14+EGFR - CBC with Differential/Platelet - Lipid panel  6. Hyperlipidemia associated with type 2 diabetes mellitus (HCC)  -  CMP14+EGFR - CBC with Differential/Platelet - Lipid panel  7. Type 2 diabetes mellitus with other specified complication, without long-term current use of insulin (HCC) - Bayer DCA Hb A1c Waived - CMP14+EGFR - CBC with Differential/Platelet - Microalbumin / creatinine urine ratio - TSH - Vitamin B12  8. Aortic atherosclerosis  - CMP14+EGFR - CBC with Differential/Platelet  9. History of DVT (deep vein thrombosis) - CMP14+EGFR - CBC with Differential/Platelet  10. Heart failure with recovered ejection fraction (HFrecEF) (HCC)  - CMP14+EGFR - CBC with Differential/Platelet  11. Coronary artery disease involving native coronary artery of native heart without angina pectoris - CMP14+EGFR - CBC with Differential/Platelet  12. Angina pectoris (HCC) - CMP14+EGFR - CBC with Differential/Platelet    Labs pending Will start omeprazole  20 mg Pt has not taken any of her medications today. PT will go home and take medications. She will stop by here this week to recheck BP after she takes all medications.  Health Maintenance reviewed Diet and exercise encouraged  Follow up plan: 3 months   Bari Learn, FNP

## 2024-01-01 ENCOUNTER — Ambulatory Visit: Payer: Self-pay | Admitting: Family

## 2024-01-01 ENCOUNTER — Encounter (HOSPITAL_COMMUNITY)

## 2024-01-01 ENCOUNTER — Encounter (HOSPITAL_COMMUNITY)
Admission: RE | Admit: 2024-01-01 | Discharge: 2024-01-01 | Disposition: A | Discharge status: Home / Self Care | Source: Ambulatory Visit | Attending: Cardiology | Admitting: Cardiology

## 2024-01-01 DIAGNOSIS — E1129 Type 2 diabetes mellitus with other diabetic kidney complication: Secondary | ICD-10-CM

## 2024-01-01 DIAGNOSIS — Z955 Presence of coronary angioplasty implant and graft: Secondary | ICD-10-CM | POA: Diagnosis not present

## 2024-01-01 LAB — CBC WITH DIFFERENTIAL/PLATELET
Basophils Absolute: 0.1 x10E3/uL (ref 0.0–0.2)
Basos: 1 %
EOS (ABSOLUTE): 0.3 x10E3/uL (ref 0.0–0.4)
Eos: 4 %
Hematocrit: 41.3 % (ref 34.0–46.6)
Hemoglobin: 13.2 g/dL (ref 11.1–15.9)
Immature Grans (Abs): 0 x10E3/uL (ref 0.0–0.1)
Immature Granulocytes: 0 %
Lymphocytes Absolute: 2.2 x10E3/uL (ref 0.7–3.1)
Lymphs: 29 %
MCH: 28.8 pg (ref 26.6–33.0)
MCHC: 32 g/dL (ref 31.5–35.7)
MCV: 90 fL (ref 79–97)
Monocytes Absolute: 0.6 x10E3/uL (ref 0.1–0.9)
Monocytes: 8 %
Neutrophils Absolute: 4.5 x10E3/uL (ref 1.4–7.0)
Neutrophils: 58 %
Platelets: 239 x10E3/uL (ref 150–450)
RBC: 4.58 x10E6/uL (ref 3.77–5.28)
RDW: 13.4 % (ref 11.7–15.4)
WBC: 7.6 x10E3/uL (ref 3.4–10.8)

## 2024-01-01 LAB — TSH: TSH: 1.66 u[IU]/mL (ref 0.450–4.500)

## 2024-01-01 LAB — MICROALBUMIN / CREATININE URINE RATIO
Creatinine, Urine: 199.1 mg/dL
Microalb/Creat Ratio: 51 mg/g{creat} — ABNORMAL HIGH (ref 0–29)
Microalbumin, Urine: 101.3 ug/mL

## 2024-01-01 LAB — VITAMIN B12: Vitamin B-12: 333 pg/mL (ref 232–1245)

## 2024-01-01 LAB — CMP14+EGFR
ALT: 12 IU/L (ref 0–32)
AST: 15 IU/L (ref 0–40)
Albumin: 4.1 g/dL (ref 3.8–4.8)
Alkaline Phosphatase: 72 IU/L (ref 49–135)
BUN/Creatinine Ratio: 20 (ref 12–28)
BUN: 20 mg/dL (ref 8–27)
Bilirubin Total: 0.5 mg/dL (ref 0.0–1.2)
CO2: 21 mmol/L (ref 20–29)
Calcium: 9.5 mg/dL (ref 8.7–10.3)
Chloride: 108 mmol/L — AB (ref 96–106)
Creatinine, Ser: 0.99 mg/dL (ref 0.57–1.00)
Globulin, Total: 2.1 g/dL (ref 1.5–4.5)
Glucose: 89 mg/dL (ref 70–99)
Potassium: 4 mmol/L (ref 3.5–5.2)
Sodium: 144 mmol/L (ref 134–144)
Total Protein: 6.2 g/dL (ref 6.0–8.5)
eGFR: 58 mL/min/1.73 — AB (ref >59)

## 2024-01-01 LAB — LIPID PANEL
Cholesterol, Total: 166 mg/dL (ref 100–199)
HDL: 42 mg/dL (ref >39)
LDL CALC COMMENT:: 4 ratio (ref 0.0–4.4)
LDL Chol Calc (NIH): 95 mg/dL (ref 0–99)
Triglycerides: 168 mg/dL — AB (ref 0–149)
VLDL Cholesterol Cal: 29 mg/dL (ref 5–40)

## 2024-01-01 MED ORDER — DAPAGLIFLOZIN PROPANEDIOL 5 MG PO TABS: 5.0000 mg | ORAL_TABLET | Freq: Every day | ORAL | 3 refills | Status: DC

## 2024-01-01 NOTE — Progress Notes (Signed)
 Daily Session Note  Patient Details  Name: [name] MRN: [id number] Date of Birth: 1945-08-23 Referring Provider:   Flowsheet Row CARDIAC REHAB PHASE II ORIENTATION from 10/31/2023 in [facility] CARD[facility]AC REHABILITATION  Referring Provider Lavona Agent MD    Encounter Date: 01/01/2024  Check In:  Session Check In - 01/01/24 1509       Check-In   Supervising physician immediately available to respond to emergencies See telemetry face sheet for immediately available MD    Location [facility]    Staff Present Powell Benders, BS, Exercise Physiologist;Ted Goodner Jackquline, BSN, RN, WTA-C;Victoria Zina, RN;Laureen Delores, BS, RRT, CPFT    Virtual Visit No    Medication changes reported     No    Fall or balance concerns reported    No    Tobacco Cessation No Change    Warm-up and Cool-down Performed on first and last piece of equipment    Resistance Training Performed Yes    VAD Patient? No    PAD/SET Patient? No      Pain Assessment   Currently in Pain? No/denies          Capillary Blood Glucose: Results for orders placed or performed in visit on 12/31/23 (from the past 24 hours)  Bayer DCA Hb A1c Waived     Status: Abnormal   Collection Time: 12/31/23  3:40 PM  Result Value Ref Range   HB A1C (BAYER DCA - WAIVED) 6.8 (H) 4.8 - 5.6 %   Narrative   Performed at:  [address] - [facility] [address] [place], [place]  [id number] Lab Director: Stefano Sprang [facility], Phone:  [phone]  CMP14+EGFR     Status: Abnormal   Collection Time: 12/31/23  3:42 PM  Result Value Ref Range   Glucose 89 70 - 99 mg/dL   BUN 20 8 - 27 mg/dL   Creatinine, Ser 9.00 0.57 - 1.00 mg/dL   eGFR 58 (L) >40 fO/fpw/8.26   BUN/Creatinine Ratio 20 12 - 28   Sodium 144 134 - 144 mmol/L   Potassium 4.0 3.5 - 5.2 mmol/L   Chloride 108 (H) 96 - 106 mmol/L   CO2 21 20 - 29 mmol/L   Calcium  9.5 8.7 - 10.3 mg/dL   Total Protein 6.2 6.0 - 8.5 g/dL   Albumin 4.1 3.8 - 4.8 g/dL    Globulin, Total 2.1 1.5 - 4.5 g/dL   Bilirubin Total 0.5 0.0 - 1.2 mg/dL   Alkaline Phosphatase 72 49 - 135 IU/L   AST 15 0 - 40 IU/L   ALT 12 0 - 32 IU/L   Narrative   Performed at:  [address] [facility] [address] [place], [place]  [id number] Lab Director: Frankey Sas MD, Phone:  [phone]  CBC with Differential/Platelet     Status: None   Collection Time: 12/31/23  3:42 PM  Result Value Ref Range   WBC 7.6 3.4 - 10.8 x10E3/uL   RBC 4.58 3.77 - 5.28 x10E6/uL   Hemoglobin 13.2 11.1 - 15.9 g/dL   Hematocrit 58.6 65.9 - 46.6 %   MCV 90 79 - 97 fL   MCH 28.8 26.6 - 33.0 pg   MCHC 32.0 31.5 - 35.7 g/dL   RDW 86.5 88.2 - 84.5 %   Platelets 239 150 - 450 x10E3/uL   Neutrophils 58 Not Estab. %   Lymphs 29 Not Estab. %   Monocytes 8 Not Estab. %   Eos 4 Not Estab. %  Basos 1 Not Estab. %   Neutrophils Absolute 4.5 1.4 - 7.0 x10E3/uL   Lymphocytes Absolute 2.2 0.7 - 3.1 x10E3/uL   Monocytes Absolute 0.6 0.1 - 0.9 x10E3/uL   EOS (ABSOLUTE) 0.3 0.0 - 0.4 x10E3/uL   Basophils Absolute 0.1 0.0 - 0.2 x10E3/uL   Immature Granulocytes 0 Not Estab. %   Immature Grans (Abs) 0.0 0.0 - 0.1 x10E3/uL   Narrative   Performed at:  [address] [employer] [address] [place], [place]  [id number] Lab Director: Frankey Sas MD, Phone:  [phone]  Lipid panel     Status: Abnormal   Collection Time: 12/31/23  3:42 PM  Result Value Ref Range   Cholesterol, Total 166 100 - 199 mg/dL   Triglycerides 831 (H) 0 - 149 mg/dL   HDL 42 >60 mg/dL   VLDL Cholesterol Cal 29 5 - 40 mg/dL   LDL Chol Calc (NIH) 95 0 - 99 mg/dL   Chol/HDL Ratio 4.0 0.0 - 4.4 ratio   Narrative   Performed at:  [address] [facility] [address], [place], [place]  [id number] Lab Director: Frankey Sas MD, Phone:  [phone]  Microalbumin / creatinine urine ratio     Status: Abnormal   Collection Time: 12/31/23  3:42 PM  Result Value Ref Range   Creatinine, Urine 199.1 Not Estab. mg/dL   Microalbumin,  Urine 101.3 Not Estab. ug/mL   Microalb/Creat Ratio 51 (H) 0 - 29 mg/g creat   Narrative   Performed at:  [address] [facility] [address], [place], [place]  [id number] Lab Director: Frankey Sas MD, Phone:  [phone]  TSH     Status: None   Collection Time: 12/31/23  3:42 PM  Result Value Ref Range   TSH 1.660 0.450 - 4.500 uIU/mL   Narrative   Performed at:  [address] [facility] [address], [place], [place]  [id number] Lab Director: Frankey Sas MD, Phone:  [phone]  Vitamin B12     Status: None   Collection Time: 12/31/23  3:42 PM  Result Value Ref Range   Vitamin B-12 333 232 - 1,245 pg/mL   Narrative   Performed at:  [address] [facility] [address] [place], [place]  [id number] Lab Director: Frankey Sas MD, Phone:  [phone]      Social History   Tobacco Use  Smoking Status Never  Smokeless Tobacco Never    Goals Met:    Goals Unmet:  Not Applicable  Comments:  Pt able to follow exercise prescription today without complaint.  Will continue to monitor for progression.

## 2024-01-03 ENCOUNTER — Encounter: Payer: Self-pay | Admitting: Family

## 2024-01-03 ENCOUNTER — Ambulatory Visit (INDEPENDENT_AMBULATORY_CARE_PROVIDER_SITE_OTHER): Admitting: Family

## 2024-01-03 ENCOUNTER — Encounter (HOSPITAL_COMMUNITY)
Admission: RE | Admit: 2024-01-03 | Discharge: 2024-01-03 | Disposition: A | Discharge status: Home / Self Care | Source: Ambulatory Visit | Attending: Cardiology | Admitting: Cardiology

## 2024-01-03 ENCOUNTER — Encounter (HOSPITAL_COMMUNITY)

## 2024-01-03 VITALS — BP 133/71 | HR 71 | Temp 97.1°F | Ht 64.0 in | Wt 160.0 lb

## 2024-01-03 DIAGNOSIS — Z955 Presence of coronary angioplasty implant and graft: Secondary | ICD-10-CM | POA: Diagnosis not present

## 2024-01-03 DIAGNOSIS — I10 Essential (primary) hypertension: Secondary | ICD-10-CM

## 2024-01-03 DIAGNOSIS — R600 Localized edema: Secondary | ICD-10-CM | POA: Diagnosis not present

## 2024-01-03 DIAGNOSIS — Z86718 Personal history of other venous thrombosis and embolism: Secondary | ICD-10-CM | POA: Diagnosis not present

## 2024-01-03 MED ORDER — TRIAMCINOLONE ACETONIDE 0.5 % EX OINT: 1.0000 | TOPICAL_OINTMENT | Freq: Two times a day (BID) | CUTANEOUS | 0 refills | Status: AC

## 2024-01-03 MED ORDER — FUROSEMIDE 20 MG PO TABS
20.0000 mg | ORAL_TABLET | Freq: Every day | ORAL | 1 refills | Status: AC
Start: 2024-01-03 — End: ?

## 2024-01-03 MED ORDER — APIXABAN 5 MG PO TABS: 5.0000 mg | ORAL_TABLET | Freq: Two times a day (BID) | ORAL | 5 refills | Status: AC

## 2024-01-03 NOTE — Patient Instructions (Signed)
 Peripheral Edema  Peripheral edema is swelling that is caused by a buildup of fluid. Peripheral edema most often affects the lower legs, ankles, and feet. It can also develop in the arms, hands, and face. The area of the body that has peripheral edema will look swollen. It may also feel heavy or warm. Your clothes may start to feel tight. Pressing on the area may make a temporary dent in your skin (pitting edema). You may not be able to move your swollen arm or leg as much as usual. There are many causes of peripheral edema. It can happen because of a complication of other conditions such as heart failure, kidney disease, or a problem with your circulation. It also can be a side effect of certain medicines or happen because of an infection. It often happens to women during pregnancy. Sometimes, the cause is not known. Follow these instructions at home: Managing pain, stiffness, and swelling  Raise (elevate) your legs while you are sitting or lying down. Move around often to prevent stiffness and to reduce swelling. Do not sit or stand for long periods of time. Do not wear tight clothing. Do not wear garters on your upper legs. Exercise your legs to get your circulation going. This helps to move the fluid back into your blood vessels, and it may help the swelling go down. Wear compression stockings as told by your health care provider. These stockings help to prevent blood clots and reduce swelling in your legs. It is important that these are the correct size. These stockings should be prescribed by your doctor to prevent possible injuries. If elastic bandages or wraps are recommended, use them as told by your health care provider. Medicines Take over-the-counter and prescription medicines only as told by your health care provider. Your health care provider may prescribe medicine to help your body get rid of excess water (diuretic). Take this medicine if you are told to take it. General  instructions Eat a low-salt (low-sodium) diet as told by your health care provider. Sometimes, eating less salt may reduce swelling. Pay attention to any changes in your symptoms. Moisturize your skin daily to help prevent skin from cracking and draining. Keep all follow-up visits. This is important. Contact a health care provider if: You have a fever. You have swelling in only one leg. You have increased swelling, redness, or pain in one or both of your legs. You have drainage or sores at the area where you have edema. Get help right away if: You have edema that starts suddenly or is getting worse, especially if you are pregnant or have a medical condition. You develop shortness of breath, especially when you are lying down. You have pain in your chest or abdomen. You feel weak. You feel like you will faint. These symptoms may be an emergency. Get help right away. Call 911. Do not wait to see if the symptoms will go away. Do not drive yourself to the hospital. Summary Peripheral edema is swelling that is caused by a buildup of fluid. Peripheral edema most often affects the lower legs, ankles, and feet. Move around often to prevent stiffness and to reduce swelling. Do not sit or stand for long periods of time. Pay attention to any changes in your symptoms. Contact a health care provider if you have edema that starts suddenly or is getting worse, especially if you are pregnant or have a medical condition. Get help right away if you develop shortness of breath, especially when lying down.  This information is not intended to replace advice given to you by your health care provider. Make sure you discuss any questions you have with your health care provider. Document Revised: 11/29/2020 Document Reviewed: 11/29/2020 Elsevier Patient Education  2024 [employer].

## 2024-01-03 NOTE — Progress Notes (Signed)
 /  Subjective:    Patient ID: [name], female    DOB: 04/11/1945, 78 y.o.   MRN: [id number]  Chief Complaint  Patient presents with   Hypertension   Rash    ON LEGS    PT presents to the office today to recheck HTN. She was seen on 12/31/23, but had not taken any of her medications. She has taken her medications today and her BP is at goal.   Complaining of right leg swelling and erythemas that she noticed weeks ago.  Reports it is itching and has not improved.  Hypertension This is a chronic problem. The current episode started more than 1 year ago. The problem has been resolved since onset. The problem is controlled. Associated symptoms include malaise/fatigue, peripheral edema and shortness of breath (if I get in a hurry). The current treatment provides moderate improvement.  Rash Associated symptoms include shortness of breath (if I get in a hurry).      Review of Systems  Constitutional:  Positive for malaise/fatigue.  Respiratory:  Positive for shortness of breath (if I get in a hurry).   Skin:  Positive for rash.  All other systems reviewed and are negative.   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 06/19/21-Currently lives with daughter and is actively looking for a rental home.    1 son and 1 daughter. Son died in 01-Feb-2016.   2 grandchildren    Left handedNo caffiene   Social Drivers of Health   Financial Resource Strain: Low Risk  (09/19/2022)   Overall Financial Resource Strain (CARDIA)    Difficulty of Paying Living Expenses: Not hard at all  Food Insecurity: No Food Insecurity (10/04/2023)   Hunger Vital Sign    Worried About Running Out of Food in the Last Year: Never true    Ran Out of Food in the Last Year: Never true  Transportation Needs: No Transportation Needs (10/04/2023)   PRAPARE - Administrator, Civil Service (Medical): No    Lack of Transportation (Non-Medical): No  Physical Activity: Insufficiently Active (09/19/2022)   Exercise Vital Sign    Days of Exercise per Week: 3 days    Minutes of Exercise per Session: 30 min  Stress: No Stress Concern Present (09/19/2022)   [employer] of Occupational Health - Occupational Stress Questionnaire    Feeling of Stress : Not at all  Social Connections: Unknown (10/04/2023)   Social Connection and Isolation Panel    Frequency of Communication with Friends and Family: More than three times a week    Frequency of Social Gatherings with Friends and Family: More than three times a week    Attends Religious Services: More than 4 times per year    Active Member of [employer] or Organizations:  Yes    Attends Banker Meetings: More than 4 times per year    Marital Status: Patient declined   Family History  Problem Relation Age of Onset   Hypertension Mother    Arthritis Mother    CAD Mother 32   Hip fracture Mother 31   COPD Father    Crohn's disease Sister    Cancer Sister        lung   Early death Brother 1       house fire   Diabetes Maternal Grandmother    Hypertension Sister    Hypertension Brother    Colon cancer Neg Hx         Objective:   Physical Exam Vitals reviewed.  Constitutional:      General: She is not in acute distress.    Appearance: She is well-developed.  HENT:     Head: Normocephalic and atraumatic.  Eyes:      Pupils: Pupils are equal, round, and reactive to light.  Neck:     Thyroid : No thyromegaly.  Cardiovascular:     Rate and Rhythm: Normal rate and regular rhythm.     Heart sounds: Normal heart sounds. No murmur heard. Pulmonary:     Effort: Pulmonary effort is normal. No respiratory distress.     Breath sounds: Normal breath sounds. No wheezing.  Abdominal:     General: Bowel sounds are normal. There is no distension.     Palpations: Abdomen is soft.     Tenderness: There is no abdominal tenderness.  Musculoskeletal:        General: No tenderness. Normal range of motion.     Cervical back: Normal range of motion and neck supple.     Right lower leg: Edema (3+) present.     Left lower leg: Edema (2 +) present.     Comments: RLE slightly erythemas, no warmth or tenderness  Skin:    General: Skin is warm and dry.  Neurological:     Mental Status: She is alert and oriented to person, place, and time.     Cranial Nerves: No cranial nerve deficit.     Deep Tendon Reflexes: Reflexes are normal and symmetric.  Psychiatric:        Behavior: Behavior normal.        Thought Content: Thought content normal.        Judgment: Judgment normal.       BP 133/71   Pulse 71   Temp (!) 97.1 F (36.2 C) (Temporal)   Ht 5' 4 (1.626 m)   Wt 160 lb (72.6 kg)   LMP 01/22/1981 (LMP Unknown)   BMI 27.46 kg/m      Assessment & Plan:  [name] comes in today with chief complaint of Hypertension and Rash (ON LEGS )   Diagnosis and orders addressed:  1. Essential hypertension (Primary) At goal   2. Peripheral edema Start lasix  20 mg daily Elevated feet Low salt diet  Wear compression hose daily Follow up in 1 month - Compression stockings - furosemide  (LASIX ) 20 MG tablet; Take 1 tablet (20 mg total) by mouth daily.  Dispense: 90 tablet; Refill: 1  3. History of DVT (deep vein thrombosis) Continue Eliquis   - apixaban  (ELIQUIS ) 5 MG TABS tablet; Take 1 tablet (5 mg total) by  mouth 2 (two) times daily.  Dispense: 60 tablet; Refill: 5   Labs reviewed Start Lasix  20 mg daily Continue current medications  Follow up in 1  month    Bari Learn, FNP

## 2024-01-03 NOTE — Progress Notes (Signed)
 Daily Session Note  Patient Details  Name: [name] MRN: [id number] Date of Birth: 1946-01-18 Referring Provider:   Flowsheet Row CARDIAC REHAB PHASE II ORIENTATION from 10/31/2023 in [facility] CARDIAC REHABILITATION  Referring Provider Lavona Agent MD    Encounter Date: 01/03/2024  Check In:  Session Check In - 01/03/24 1446       Check-In   Supervising physician immediately available to respond to emergencies See telemetry face sheet for immediately available MD    Location [facility]    Staff Present Powell Benders, BS, Exercise Physiologist;Jessica Vonzell, MA, RCEP, CCRP, CCET    Virtual Visit No    Medication changes reported     No    Tobacco Cessation No Change    Warm-up and Cool-down Performed on first and last piece of equipment    Resistance Training Performed Yes    VAD Patient? No    PAD/SET Patient? No      Pain Assessment   Currently in Pain? No/denies    Multiple Pain Sites No          Capillary Blood Glucose: No results found for this or any previous visit (from the past 24 hours).    Social History   Tobacco Use  Smoking Status Never  Smokeless Tobacco Never    Goals Met:  Independence with exercise equipment Exercise tolerated well No report of concerns or symptoms today Strength training completed today  Goals Unmet:  Not Applicable  Comments: Pt able to follow exercise prescription today without complaint.  Will continue to monitor for progression.

## 2024-01-08 ENCOUNTER — Encounter (HOSPITAL_COMMUNITY)
Admission: RE | Admit: 2024-01-08 | Discharge: 2024-01-08 | Disposition: A | Discharge status: Home / Self Care | Source: Ambulatory Visit | Attending: Cardiology | Admitting: Cardiology

## 2024-01-08 ENCOUNTER — Ambulatory Visit

## 2024-01-08 ENCOUNTER — Encounter (HOSPITAL_COMMUNITY)

## 2024-01-08 VITALS — BP 133/71 | HR 71 | Ht 64.0 in | Wt 160.0 lb

## 2024-01-08 DIAGNOSIS — Z13820 Encounter for screening for osteoporosis: Secondary | ICD-10-CM

## 2024-01-08 DIAGNOSIS — Z0000 Encounter for general adult medical examination without abnormal findings: Secondary | ICD-10-CM

## 2024-01-08 DIAGNOSIS — Z139 Encounter for screening, unspecified: Secondary | ICD-10-CM

## 2024-01-08 DIAGNOSIS — Z955 Presence of coronary angioplasty implant and graft: Secondary | ICD-10-CM | POA: Diagnosis not present

## 2024-01-08 NOTE — Patient Instructions (Signed)
 [name],  Thank you for taking the time for your Medicare Wellness Visit. I appreciate your continued commitment to your health goals. Please review the care plan we discussed, and feel free to reach out if I can assist you further.  Medicare recommends these wellness visits once per year to help you and your care team stay ahead of potential health issues. These visits are designed to focus on prevention, allowing your provider to concentrate on managing your acute and chronic conditions during your regular appointments.  Please note that Annual Wellness Visits do not include a physical exam. Some assessments may be limited, especially if the visit was conducted virtually. If needed, we may recommend a separate in-person follow-up with your provider.  Ongoing Care Seeing your primary care provider every 3 to 6 months helps us  monitor your health and provide consistent, personalized care.   Referrals If a referral was made during today's visit and you haven't received any updates within two weeks, please contact the referred provider directly to check on the status.  Recommended Screenings:  Health Maintenance  Topic Date Due   Eye exam for diabetics  07/03/2015   DEXA scan (bone density measurement)  05/06/2023   Medicare Annual Wellness Visit  09/19/2023   COVID-19 Vaccine (4 - 2025-26 season) 01/16/2024*   Zoster (Shingles) Vaccine (1 of 2) 03/31/2024*   Hemoglobin A1C  06/29/2024   Yearly kidney function blood test for diabetes  12/30/2024   Yearly kidney health urinalysis for diabetes  12/30/2024   Complete foot exam   12/30/2024   DTaP/Tdap/Td vaccine (2 - Td or Tdap) 12/22/2029   Pneumococcal Vaccine for age over 73  Completed   Flu Shot  Completed   Hepatitis C Screening  Completed   HPV Vaccine  Aged Out   Meningitis B Vaccine  Aged Out   Breast Cancer Screening  Discontinued   Colon Cancer Screening  Discontinued  *Topic was postponed. The date shown is not the  original due date.       01/08/2024    8:31 AM  Advanced Directives  Does Patient Have a Medical Advance Directive? No   Advance Care Planning is important because it: Ensures you receive medical care that aligns with your values, goals, and preferences. Provides guidance to your family and loved ones, reducing the emotional burden of decision-making during critical moments.  Vision: Annual vision screenings are recommended for early detection of glaucoma, cataracts, and diabetic retinopathy. These exams can also reveal signs of chronic conditions such as diabetes and high blood pressure.  Dental: Annual dental screenings help detect early signs of oral cancer, gum disease, and other conditions linked to overall health, including heart disease and diabetes.  Please see the attached documents for additional preventive care recommendations.

## 2024-01-08 NOTE — Progress Notes (Signed)
 Subjective:   [name] is a 78 y.o. who presents for a Medicare Wellness preventive visit.  As a reminder, Annual Wellness Visits don't include a physical exam, and some assessments may be limited, especially if this visit is performed virtually. We may recommend an in-person follow-up visit with your provider if needed.  Visit Complete: Virtual I connected with  [name] on 01/08/24 by a audio enabled telemedicine application and verified that I am speaking with the correct person using two identifiers.  Patient Location: Home  Provider Location: Home Office  I discussed the limitations of evaluation and management by telemedicine. The patient expressed understanding and agreed to proceed.  Vital Signs: Because this visit was a virtual/telehealth visit, some criteria may be missing or patient reported. Any vitals not documented were not able to be obtained and vitals that have been documented are patient reported.  VideoDeclined- This patient declined Librarian, academic. Therefore the visit was completed with audio only.  Persons Participating in Visit: Patient.  AWV Questionnaire: No: Patient Medicare AWV questionnaire was not completed prior to this visit.  Cardiac Risk Factors include: advanced age (>57men, >93 women);smoking/ tobacco exposure;diabetes mellitus;dyslipidemia;hypertension     Objective:    Today's Vitals   01/08/24 0905  BP: 133/71  Pulse: 71  Weight: 160 lb (72.6 kg)  Height: 5' 4 (1.626 m)   Body mass index is 27.46 kg/m.     01/08/2024    8:31 AM 10/31/2023    2:16 PM 10/04/2023    6:11 AM 06/14/2023    4:24 PM 12/06/2022   10:26 AM 10/23/2022   12:56 PM 09/19/2022    1:57 PM  Advanced Directives  Does Patient Have a Medical Advance Directive? No No No No No No No  Would patient like information on creating a medical advance directive?  Yes (MAU/Ambulatory/Procedural Areas - Information given) No - Patient  declined    No - Patient declined    Current Medications (verified) Outpatient Encounter Medications as of 01/08/2024  Medication Sig   acetaminophen  (TYLENOL ) 500 MG tablet Take 1,000 mg by mouth every 6 (six) hours as needed for moderate pain or headache.   albuterol  (PROVENTIL ) (2.5 MG/3ML) 0.083% nebulizer solution Take 3 mLs (2.5 mg total) by nebulization every 6 (six) hours as needed for wheezing or shortness of breath.   albuterol  (VENTOLIN  HFA) 108 (90 Base) MCG/ACT inhaler Inhale 2 puffs into the lungs every 6 (six) hours as needed for wheezing or shortness of breath.   amLODipine  (NORVASC ) 10 MG tablet Take 1 tablet (10 mg total) by mouth daily.   apixaban  (ELIQUIS ) 5 MG TABS tablet Take 1 tablet (5 mg total) by mouth 2 (two) times daily.   aspirin  81 MG chewable tablet Chew 1 tablet (81 mg total) by mouth daily.   bisoprolol  (ZEBETA ) 5 MG tablet Take 1 tablet (5 mg total) by mouth daily.   clopidogrel  (PLAVIX ) 75 MG tablet Take 1 tablet (75 mg total) by mouth daily with breakfast.   dapagliflozin  propanediol (FARXIGA ) 5 MG TABS tablet Take 1 tablet (5 mg total) by mouth daily.   erythromycin  ophthalmic ointment Place 1 Application into the right eye at bedtime.   furosemide  (LASIX ) 20 MG tablet Take 1 tablet (20 mg total) by mouth daily.   glucose blood (ONETOUCH VERIO) test strip Use as instructed   isosorbide  mononitrate (IMDUR ) 30 MG 24 hr tablet Take 1 tablet (30 mg total) by mouth daily.   nitroGLYCERIN  (NITROSTAT )  0.4 MG SL tablet Place 1 tablet (0.4 mg total) under the tongue every 5 (five) minutes as needed for chest pain.   omeprazole  (PRILOSEC) 20 MG capsule Take 1 capsule (20 mg total) by mouth daily.   ONETOUCH DELICA LANCETS FINE MISC Use to check BG once daily   rosuvastatin  (CRESTOR ) 40 MG tablet Take 1 tablet (40 mg total) by mouth daily.   sacubitril -valsartan  (ENTRESTO ) 97-103 MG Take 1 tablet by mouth 2 (two) times daily.   spironolactone  (ALDACTONE ) 25 MG tablet  Take 1 tablet (25 mg total) by mouth daily.   triamcinolone  ointment (KENALOG ) 0.5 % Apply 1 Application topically 2 (two) times daily.   No facility-administered encounter medications on file as of 01/08/2024.    Allergies (verified) Levaquin  [levofloxacin  in d5w] and Penicillins   History: Past Medical History:  Diagnosis Date   Asthma    Chronic systolic heart failure (HCC)    Echo (08/26/13):  Mild LVH. EF 25% to 30%. Diffuse HK. Aortic valve: There was trivial regurgitation. Mitral valve: There was mild regurgitation. Left atrium: The atrium was mildly dilated.   GERD (gastroesophageal reflux disease)    Headache(784.0)    History of kidney stones    History of nephrolithiasis    Hypertension    NICM (nonischemic cardiomyopathy) (HCC)    a. LHC (08/2013):  no CAD   Sleep apnea    does not use machine   Stroke (HCC)    Type II or unspecified type diabetes mellitus without mention of complication, uncontrolled    borderline   Past Surgical History:  Procedure Laterality Date   ABDOMINAL HYSTERECTOMY     CATARACT EXTRACTION     COLONOSCOPY WITH PROPOFOL  N/A 07/01/2019   Procedure: COLONOSCOPY WITH PROPOFOL ;  Surgeon: Harvey Margo CROME, MD;  [facility];  Service: Endoscopy;  Laterality: N/A;  2:15pm - pt will not move up   CORONARY STENT INTERVENTION N/A 10/04/2023   Procedure: CORONARY STENT INTERVENTION;  Surgeon: Swaziland, Peter M, MD;  Location: [facility] INVASIVE CV LAB;  Service: Cardiovascular;  Laterality: N/A;   CYSTOSCOPY WITH RETROGRADE PYELOGRAM, URETEROSCOPY AND STENT PLACEMENT Left 08/11/2021   Procedure: CYSTOSCOPY WITH RETROGRADE PYELOGRAM, URETEROSCOPY AND STENT PLACEMENT;  Surgeon: Sherrilee Belvie CROME, MD;  Location: [facility];  Service: Urology;  Laterality: Left;   CYSTOSCOPY WITH RETROGRADE PYELOGRAM, URETEROSCOPY AND STENT PLACEMENT Left 09/08/2021   Procedure: CYSTOSCOPY WITH RETROGRADE PYELOGRAM, URETEROSCOPY AND STENT EXCHANGE;  Surgeon: Sherrilee Belvie CROME, MD;  Location: [facility];  Service: Urology;  Laterality: Left;   CYSTOSCOPY WITH RETROGRADE PYELOGRAM, URETEROSCOPY AND STENT PLACEMENT Right 10/06/2021   Procedure: CYSTOSCOPY WITH RETROGRADE PYELOGRAM, URETEROSCOPY AND STENT PLACEMENT;  Surgeon: Sherrilee Belvie CROME, MD;  Location: [facility];  Service: Urology;  Laterality: Right;   CYSTOSCOPY WITH RETROGRADE PYELOGRAM, URETEROSCOPY AND STENT PLACEMENT Right 10/27/2021   Procedure: CYSTOSCOPY WITH RETROGRADE PYELOGRAM, URETEROSCOPY AND STENT EXCHANGE;  Surgeon: Sherrilee Belvie CROME, MD;  Location: [facility];  Service: Urology;  Laterality: Right;   EXTRACORPOREAL SHOCK WAVE LITHOTRIPSY Left 05/31/2021   Procedure: EXTRACORPOREAL SHOCK WAVE LITHOTRIPSY (ESWL);  Surgeon: Sherrilee Belvie CROME, MD;  Location: [facility];  Service: Urology;  Laterality: Left;   EXTRACORPOREAL SHOCK WAVE LITHOTRIPSY Left 06/28/2021   Procedure: EXTRACORPOREAL SHOCK WAVE LITHOTRIPSY (ESWL);  Surgeon: Sherrilee Belvie CROME, MD;  Location: [facility];  Service: Urology;  Laterality: Left;   HOLMIUM LASER APPLICATION Left 08/11/2021   Procedure: HOLMIUM LASER APPLICATION;  Surgeon: Sherrilee Belvie CROME, MD;  Location: [facility];  Service: Urology;  Laterality: Left;   HOLMIUM LASER APPLICATION Left 09/08/2021   Procedure: HOLMIUM LASER APPLICATION;  Surgeon: Sherrilee Belvie CROME, MD;  Location: [facility];  Service: Urology;  Laterality: Left;   HOLMIUM LASER APPLICATION Right 10/06/2021   Procedure: HOLMIUM LASER APPLICATION;  Surgeon: Sherrilee Belvie CROME, MD;  Location: [facility];  Service: Urology;  Laterality: Right;   HOLMIUM LASER APPLICATION Right 10/27/2021   Procedure: HOLMIUM LASER APPLICATION;  Surgeon: Sherrilee Belvie CROME, MD;  Location: [facility];  Service: Urology;  Laterality: Right;   LEFT AND RIGHT HEART CATHETERIZATION WITH CORONARY ANGIOGRAM N/A 09/03/2013   Procedure: LEFT AND RIGHT HEART CATHETERIZATION WITH CORONARY ANGIOGRAM;  Surgeon: Lonni JONETTA Cash, MD;  [facility] CATH  LAB;  Service: Cardiovascular;  Laterality: N/A;   LEFT HEART CATH AND CORONARY ANGIOGRAPHY N/A 10/04/2023   Procedure: LEFT HEART CATH AND CORONARY ANGIOGRAPHY;  Surgeon: Swaziland, Peter M, MD;  Location: [facility] INVASIVE CV LAB;  Service: Cardiovascular;  Laterality: N/A;   OOPHORECTOMY     removed 1 ovary and 1 ovary remains.   POLYPECTOMY  07/01/2019   Procedure: POLYPECTOMY;  Surgeon: Harvey Margo CROME, MD;  [facility];  Service: Endoscopy;;   STONE EXTRACTION WITH BASKET Left 09/08/2021   Procedure: STONE EXTRACTION WITH BASKET;  Surgeon: Sherrilee Belvie CROME, MD;  Location: [facility];  Service: Urology;  Laterality: Left;   STONE EXTRACTION WITH BASKET Right 10/27/2021   Procedure: STONE EXTRACTION WITH BASKET;  Surgeon: Sherrilee Belvie CROME, MD;  Location: [facility];  Service: Urology;  Laterality: Right;   ureteral extraction of kidney stone     Family History  Problem Relation Age of Onset   Hypertension Mother    Arthritis Mother    CAD Mother 47   Hip fracture Mother 77   COPD Father    Crohn's disease Sister    Cancer Sister        lung   Early death Brother 63       house fire   Diabetes Maternal Grandmother    Hypertension Sister    Hypertension Brother    Colon cancer Neg Hx    Social History   Socioeconomic History   Marital status: Divorced    Spouse name: Not on file   Number of children: 2   Years of education: Not on file   Highest education level: Not on file  Occupational History   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 06/19/21-Currently lives with  daughter and is actively looking for a rental home.    1 son and 1 daughter. Son died in February 12, 2016.   2 grandchildren   Left handedNo caffiene   Social Drivers of Health   Financial Resource Strain: Low Risk  (01/08/2024)   Overall Financial Resource Strain (CARDIA)    Difficulty of Paying Living Expenses: Not hard at all  Food Insecurity: No Food Insecurity (01/08/2024)   Hunger Vital Sign    Worried About Running Out of Food in the Last Year: Never true    Ran  Out of Food in the Last Year: Never true  Transportation Needs: No Transportation Needs (01/08/2024)   PRAPARE - Administrator, Civil Service (Medical): No    Lack of Transportation (Non-Medical): No  Physical Activity: Insufficiently Active (01/08/2024)   Exercise Vital Sign    Days of Exercise per Week: 3 days    Minutes of Exercise per Session: 30 min  Stress: Stress Concern Present (01/08/2024)   [employer] of Occupational Health - Occupational Stress Questionnaire    Feeling of Stress: Very much  Social Connections: Unknown (01/08/2024)   Social Connection and Isolation Panel    Frequency of Communication with Friends and Family: More than three times a week    Frequency of Social Gatherings with Friends and Family: More than three times a week    Attends Religious Services: More than 4 times per year    Active Member of [employer] or Organizations: Yes    Attends Engineer, structural: More than 4 times per year    Marital Status: Patient declined    Tobacco Counseling Counseling given: Yes    Clinical Intake:  Pre-visit preparation completed: Yes  Pain : No/denies pain     BMI - recorded: 27.46 Nutritional Status: BMI 25 -29 Overweight Nutritional Risks: None Diabetes: Yes  Lab Results  Component Value Date   HGBA1C 6.8 (H) 12/31/2023   HGBA1C 7.5 (H) 11/16/2022   HGBA1C 7.1 (H) 10/25/2021     How often do you need to have someone help you when you read instructions, pamphlets, or  other written materials from your doctor or pharmacy?: 1 - Never  Interpreter Needed?: No  Comments: During pt's awv pt stated that she gets stress rather much per PHQ 2-9. Suggest for pt to talk to pcp at her next OV, to see if there is something that pcp can help her with. pt agrees. alia t/cma Information entered by :: alia t/cma   Activities of Daily Living     01/08/2024    8:28 AM 10/04/2023    4:53 PM  In your present state of health, do you have any difficulty performing the following activities:  Hearing? 0 0  Vision? 0 0  Difficulty concentrating or making decisions? 0 0  Walking or climbing stairs? 1   Dressing or bathing? 0   Doing errands, shopping? 0   Preparing Food and eating ? N   Using the Toilet? N   In the past six months, have you accidently leaked urine? Y   Do you have problems with loss of bowel control? N   Managing your Medications? N   Managing your Finances? N   Housekeeping or managing your Housekeeping? N     Patient Care Team: Lavell Bari LABOR, FNP as PCP - General (Family Medicine) Lavona Agent, MD as PCP - Cardiology (Cardiology) Harvey Margo CROME, MD (Inactive) as Consulting Physician (Gastroenterology) Billee Mliss BIRCH, [facility] as [facility] (Pharmacist) [facility], [facility] Georjean, Darice HERO, MD as Consulting Physician (Neurology)  I have updated your Care Teams any recent Medical Services you may have received from other providers in the past year.     Assessment:   This is a routine wellness examination for [name].  Hearing/Vision screen Hearing Screening - Comments:: Pt have some hearing dif Vision Screening - Comments:: Pt wear glasses/pt goes [facility] in [place]/last ov yr ago   Goals Addressed             This  Visit's Progress    Exercise 150 min/wk Moderate Activity   On track    Try to exercise more. Try to find rental home.        Depression Screen     01/08/2024    8:31 AM  01/03/2024   11:58 AM 12/31/2023    3:00 PM 11/27/2023    1:30 PM 10/31/2023    2:16 PM 11/16/2022   11:13 AM 09/19/2022    1:56 PM  PHQ 2/9 Scores  PHQ - 2 Score 0 0 0 2 2 0 0  PHQ- 9 Score 0 0 6 7 7 2      Fall Risk     01/08/2024    8:26 AM 01/03/2024   11:57 AM 10/31/2023    2:14 PM 12/06/2022   10:26 AM 11/16/2022   11:13 AM  Fall Risk   Falls in the past year? 0 0 1 0 0  Number falls in past yr: 0 0 0 0   Injury with Fall? 0 0 0 0   Risk for fall due to : No Fall Risks No Fall Risks History of fall(s);Medication side effect    Follow up Falls evaluation completed Falls evaluation completed Falls prevention discussed;Education provided Falls evaluation completed     MEDICARE RISK AT HOME:  Medicare Risk at Home Any stairs in or around the home?: No If so, are there any without handrails?: No Home free of loose throw rugs in walkways, pet beds, electrical cords, etc?: Yes Adequate lighting in your home to reduce risk of falls?: Yes Life alert?: No Use of a cane, walker or w/c?: No Grab bars in the bathroom?: No Shower chair or bench in shower?: No Elevated toilet seat or a handicapped toilet?: Yes  TIMED UP AND GO:  Was the test performed?  no  Cognitive Function: 6CIT completed    12/06/2022    3:00 PM  MMSE - Mini Mental State Exam  Orientation to time 5  Orientation to Place 5  Registration 3  Attention/ Calculation 5  Recall 3  Language- name 2 objects 2  Language- repeat 1  Language- follow 3 step command 3  Language- read & follow direction 1  Write a sentence 1  Copy design 1  Total score 30        01/08/2024    8:38 AM 09/19/2022    1:57 PM  6CIT Screen  What Year? 0 points 0 points  What month? 0 points 0 points  What time? 0 points 0 points  Count back from 20 0 points 0 points  Months in reverse 0 points 0 points  Repeat phrase 0 points 0 points  Total Score 0 points 0 points    Immunizations Immunization History  Administered Date(s)  Administered   Fluad Quad(high Dose 65+) 02/07/2019, 02/07/2021, 12/18/2023   INFLUENZA, HIGH DOSE SEASONAL PF 01/22/2018   Influenza,inj,Quad PF,6+ Mos 01/13/2015, 12/30/2015   Moderna Sars-Covid-2 Vaccination 03/16/2020   PFIZER(Purple Top)SARS-COV-2 Vaccination 06/08/2019, 07/08/2019   Pneumococcal Conjugate-13 02/27/2014   Pneumococcal Polysaccharide-23 04/25/2010   Tdap 12/23/2019    Screening Tests Health Maintenance  Topic Date Due   OPHTHALMOLOGY EXAM  07/03/2015   DEXA SCAN  05/06/2023   COVID-19 Vaccine (4 - 2025-26 season) 01/16/2024 (Originally 12/10/2023)   Zoster Vaccines- Shingrix (1 of 2) 03/31/2024 (Originally 10/21/1994)   HEMOGLOBIN A1C  06/29/2024   Diabetic kidney evaluation - eGFR measurement  12/30/2024   Diabetic kidney evaluation - Urine ACR  12/30/2024   FOOT  EXAM  12/30/2024   Medicare Annual Wellness (AWV)  01/07/2025   DTaP/Tdap/Td (2 - Td or Tdap) 12/22/2029   Pneumococcal Vaccine: 50+ Years  Completed   Influenza Vaccine  Completed   Hepatitis C Screening  Completed   HPV VACCINES  Aged Out   Meningococcal B Vaccine  Aged Out   Mammogram  Discontinued   Colonoscopy  Discontinued    Health Maintenance Items Addressed: See Nurse Notes at the end of this note  Additional Screening:  Vision Screening: Recommended annual ophthalmology exams for early detection of glaucoma and other disorders of the eye. Is the patient up to date with their annual eye exam?  No  Who is the provider or what is the name of the office in which the patient attends annual eye exams? [facility] in [facility]  Dental Screening: Recommended annual dental exams for proper oral hygiene  Community Resource Referral / Chronic Care Management: CRR required this visit?  Yes   CCM required this visit?  No   Plan:    I have personally reviewed and noted the following in the patient's chart:   Medical and social history Use of alcohol , tobacco or illicit drugs  Current  medications and supplements including opioid prescriptions. Patient is not currently taking opioid prescriptions. Functional ability and status Nutritional status Physical activity Advanced directives List of other physicians Hospitalizations, surgeries, and ER visits in previous 12 months Vitals Screenings to include cognitive, depression, and falls Referrals and appointments  In addition, I have reviewed and discussed with patient certain preventive protocols, quality metrics, and best practice recommendations. A written personalized care plan for preventive services as well as general preventive health recommendations were provided to patient.   Ozie Ned, CMA   01/08/2024   After Visit Summary: (MyChart) Due to this being a telephonic visit, the after visit summary with patients personalized plan was offered to patient via MyChart   Notes: PCP Follow Up Recommendations: pt is aware and due the following: Dexa scan-ordered, eye exam--will make an appt.

## 2024-01-08 NOTE — Progress Notes (Signed)
 Daily Session Note  Patient Details  Name: [name] MRN: [id number] Date of Birth: 10/31/1945 Referring Provider:   Flowsheet Row CARDIAC REHAB PHASE II ORIENTATION from 10/31/2023 in [facility] CARDIAC REHABILITATION  Referring Provider Lavona Agent MD    Encounter Date: 01/08/2024  Check In:  Session Check In - 01/08/24 1440       Check-In   Supervising physician immediately available to respond to emergencies See telemetry face sheet for immediately available MD    Location [facility]    Staff Present Richerd Buddle, RN;Heather Con, BS, Exercise Physiologist;Shivani Barrantes Jackquline, BSN, RN, WTA-C    Virtual Visit No    Medication changes reported     No    Fall or balance concerns reported    No    Tobacco Cessation No Change    Warm-up and Cool-down Performed on first and last piece of equipment    Resistance Training Performed Yes    VAD Patient? No    PAD/SET Patient? No      Pain Assessment   Currently in Pain? No/denies          Capillary Blood Glucose: No results found for this or any previous visit (from the past 24 hours).    Social History   Tobacco Use  Smoking Status Never  Smokeless Tobacco Never    Goals Met:  Independence with exercise equipment Exercise tolerated well No report of concerns or symptoms today Strength training completed today  Goals Unmet:  Not Applicable  Comments: Pt able to follow exercise prescription today without complaint.  Will continue to monitor for progression.

## 2024-01-10 ENCOUNTER — Encounter (HOSPITAL_COMMUNITY)

## 2024-01-10 DIAGNOSIS — Z955 Presence of coronary angioplasty implant and graft: Secondary | ICD-10-CM | POA: Insufficient documentation

## 2024-01-15 ENCOUNTER — Telehealth: Payer: Self-pay

## 2024-01-15 ENCOUNTER — Encounter (HOSPITAL_COMMUNITY)

## 2024-01-15 NOTE — Progress Notes (Unsigned)
 Care Guide Pharmacy Note  01/15/2024 Name: [name] MRN: [id number] DOB: 1945/09/06  Referred By: Lavell Bari LABOR, FNP Reason for referral: Complex Care Management (Outreach to schedule with Pharm d )   [name] is a 78 y.o. year old female who is a primary care patient of Lavell Bari LABOR, FNP.  [name] was referred to the pharmacist for assistance related to: HTN and DMII  An unsuccessful telephone outreach was attempted today to contact the patient who was referred to the pharmacy team for assistance with medication assistance. Additional attempts will be made to contact the patient.  Jeoffrey Buffalo , RMA     [facility]  [facility] Guide  Direct Dial: [phone]  Website: [email or web address]

## 2024-01-16 ENCOUNTER — Encounter (HOSPITAL_COMMUNITY): Payer: Self-pay | Admitting: *Deleted

## 2024-01-16 DIAGNOSIS — Z955 Presence of coronary angioplasty implant and graft: Secondary | ICD-10-CM

## 2024-01-16 NOTE — Progress Notes (Signed)
 Cardiac Individual Treatment Plan  Patient Details  Name: [name] MRN: [id number] Date of Birth: 03/20/1946 Referring Provider:   Flowsheet Row CARDIAC REHAB PHASE II ORIENTATION from 10/31/2023 in [facility] CARDIAC REHABILITATION  Referring Provider Lavona Agent MD    Initial Encounter Date:  Flowsheet Row CARDIAC REHAB PHASE II ORIENTATION from 10/31/2023 in [facility] CARDIAC REH[facility]ILITATION  Date 10/31/23    Visit Diagnosis: Status post coronary artery stent placement  Patient's Home Medications on Admission:  Current Outpatient Medications:    acetaminophen  (TYLENOL ) 500 MG tablet, Take 1,000 mg by mouth every 6 (six) hours as needed for moderate pain or headache., Disp: , Rfl:    albuterol  (PROVENTIL ) (2.5 MG/3ML) 0.083% nebulizer solution, Take 3 mLs (2.5 mg total) by nebulization every 6 (six) hours as needed for wheezing or shortness of breath., Disp: 150 mL, Rfl: 1   albuterol  (VENTOLIN  HFA) 108 (90 Base) MCG/ACT inhaler, Inhale 2 puffs into the lungs every 6 (six) hours as needed for wheezing or shortness of breath., Disp: 18 g, Rfl: 0   amLODipine  (NORVASC ) 10 MG tablet, Take 1 tablet (10 mg total) by mouth daily., Disp: 90 tablet, Rfl: 2   apixaban  (ELIQUIS ) 5 MG TABS tablet, Take 1 tablet (5 mg total) by mouth 2 (two) times daily., Disp: 60 tablet, Rfl: 5   aspirin  81 MG chewable tablet, Chew 1 tablet (81 mg total) by mouth daily., Disp: , Rfl:    bisoprolol  (ZEBETA ) 5 MG tablet, Take 1 tablet (5 mg total) by mouth daily., Disp: 90 tablet, Rfl: 3   clopidogrel  (PLAVIX ) 75 MG tablet, Take 1 tablet (75 mg total) by mouth daily with breakfast., Disp: 30 tablet, Rfl: 5   dapagliflozin  propanediol (FARXIGA ) 5 MG TABS tablet, Take 1 tablet (5 mg total) by mouth daily., Disp: 90 tablet, Rfl: 3   erythromycin  ophthalmic ointment, Place 1 Application into the right eye at bedtime., Disp: 3.5 g, Rfl: 0   furosemide  (LASIX ) 20 MG tablet, Take 1 tablet (20 mg total) by mouth  daily., Disp: 90 tablet, Rfl: 1   glucose blood (ONETOUCH VERIO) test strip, Use as instructed, Disp: 100 each, Rfl: 12   isosorbide  mononitrate (IMDUR ) 30 MG 24 hr tablet, Take 1 tablet (30 mg total) by mouth daily., Disp: 90 tablet, Rfl: 3   nitroGLYCERIN  (NITROSTAT ) 0.4 MG SL tablet, Place 1 tablet (0.4 mg total) under the tongue every 5 (five) minutes as needed for chest pain., Disp: 25 tablet, Rfl: 2   omeprazole  (PRILOSEC) 20 MG capsule, Take 1 capsule (20 mg total) by mouth daily., Disp: 90 capsule, Rfl: 1   ONETOUCH DELICA LANCETS FINE MISC, Use to check BG once daily, Disp: 100 each, Rfl: 2   rosuvastatin  (CRESTOR ) 40 MG tablet, Take 1 tablet (40 mg total) by mouth daily., Disp: 30 tablet, Rfl: 11   sacubitril -valsartan  (ENTRESTO ) 97-103 MG, Take 1 tablet by mouth 2 (two) times daily., Disp: 60 tablet, Rfl: 11   spironolactone  (ALDACTONE ) 25 MG tablet, Take 1 tablet (25 mg total) by mouth daily., Disp: 30 tablet, Rfl: 6   triamcinolone  ointment (KENALOG ) 0.5 %, Apply 1 Application topically 2 (two) times daily., Disp: 60 g, Rfl: 0  Past Medical History: Past Medical History:  Diagnosis Date   Asthma    Chronic systolic heart failure (HCC)    Echo (08/26/13):  Mild LVH. EF 25% to 30%. Diffuse HK. Aortic valve: There was trivial regurgitation. Mitral valve: There was mild regurgitation. Left atrium: The atrium was  mildly dilated.   GERD (gastroesophageal reflux disease)    Headache(784.0)    History of kidney stones    History of nephrolithiasis    Hypertension    NICM (nonischemic cardiomyopathy) (HCC)    a. LHC (08/2013):  no CAD   Sleep apnea    does not use machine   Stroke (HCC)    Type II or unspecified type diabetes mellitus without mention of complication, uncontrolled    borderline    Tobacco Use: Social History   Tobacco Use  Smoking Status Never  Smokeless Tobacco Never    Labs: Review Flowsheet  More data exists      Latest Ref Rng & Units 05/05/2021  08/08/2021 10/25/2021 11/16/2022 12/31/2023  Labs for ITP Cardiac and Pulmonary Rehab  Cholestrol 100 - 199 mg/dL 791  - - 786  833   LDL (calc) 0 - 99 mg/dL 874  - - 861  95   HDL-C >39 mg/dL 40  - - 44  42   Trlycerides 0 - 149 mg/dL 759  - - 824  831   Hemoglobin A1c 4.8 - 5.6 % 7.1  6.9  7.1  7.5  6.8     Capillary Blood Glucose: Lab Results  Component Value Date   GLUCAP 302 (H) 10/04/2023   GLUCAP 148 (H) 10/04/2023   GLUCAP 135 (H) 10/27/2021   GLUCAP 135 (H) 10/06/2021   GLUCAP 140 (H) 10/06/2021     Exercise Target Goals: Exercise Program Goal: Individual exercise prescription set using results from initial 6 min walk test and THRR while considering  patient's activity barriers and safety.   Exercise Prescription Goal: Starting with aerobic activity 30 plus minutes a day, 3 days per week for initial exercise prescription. Provide home exercise prescription and guidelines that participant acknowledges understanding prior to discharge.  Activity Barriers & Risk Stratification:  Activity Barriers & Cardiac Risk Stratification - 10/31/23 1414       Activity Barriers & Cardiac Risk Stratification   Activity Barriers History of Falls;Balance Concerns;Muscular Weakness;Deconditioning    Cardiac Risk Stratification Moderate          6 Minute Walk:  6 Minute Walk     Row Name 10/31/23 1507         6 Minute Walk   Phase Initial     Distance 895 feet     Walk Time 6 minutes     # of Rest Breaks 0     MPH 1.69     METS 1.38     RPE 11     Perceived Dyspnea  3     VO2 Peak 4.84     Symptoms Yes (comment)     Comments SOB, wearing flip flops     Resting HR 60 bpm     Resting BP 122/66     Resting Oxygen Saturation  98 %     Exercise Oxygen Saturation  during 6 min walk 99 %     Max Ex. HR 92 bpm     Max Ex. BP 126/74     2 Minute Post BP 106/70        Oxygen Initial Assessment:   Oxygen Re-Evaluation:   Oxygen Discharge (Final Oxygen  Re-Evaluation):   Initial Exercise Prescription:  Initial Exercise Prescription - 10/31/23 1500       Date of Initial Exercise RX and Referring Provider   Date 10/31/23    Referring Provider Lavona Agent MD      Oxygen  Maintain Oxygen Saturation 88% or higher      Treadmill   MPH 1.5    Grade 0.5    Minutes 15    METs 2.25      NuStep   Level 1    SPM 80    Minutes 15    METs 2.2      Prescription Details   Frequency (times per week) 2    Duration Progress to 30 minutes of continuous aerobic without signs/symptoms of physical distress      Intensity   THRR 40-80% of Max Heartrate 93-126    Ratings of Perceived Exertion 11-13    Perceived Dyspnea 0-4      Progression   Progression Continue to progress workloads to maintain intensity without signs/symptoms of physical distress.      Resistance Training   Training Prescription Yes    Weight 3 lb    Reps 10-15          Perform Capillary Blood Glucose checks as needed.  Exercise Prescription Changes:   Exercise Prescription Changes     Row Name 10/31/23 1500 11/08/23 1500 12/04/23 1500 01/03/24 1500       Response to Exercise   Blood Pressure (Admit) 122/60 140/70 120/64 110/60    Blood Pressure (Exercise) 126/74 122/62 136/82 --    Blood Pressure (Exit) 106/70 148/60 110/70 118/68    Heart Rate (Admit) 60 bpm 55 bpm 75 bpm 91 bpm    Heart Rate (Exercise) 92 bpm 94 bpm 117 bpm 101 bpm    Heart Rate (Exit) 70 bpm 51 bpm 90 bpm 72 bpm    Oxygen Saturation (Admit) 98 % -- -- --    Oxygen Saturation (Exercise) 99 % -- -- --    Rating of Perceived Exertion (Exercise) 11 12 15 12     Perceived Dyspnea (Exercise) 3 -- -- --    Symptoms SOB -- -- --    Comments walk test results (in flip flops) -- -- --    Duration -- Continue with 30 min of aerobic exercise without signs/symptoms of physical distress. Continue with 30 min of aerobic exercise without signs/symptoms of physical distress. Continue with 30 min  of aerobic exercise without signs/symptoms of physical distress.    Intensity -- THRR unchanged THRR unchanged THRR unchanged      Progression   Progression -- Continue to follow PAD protocol Continue to progress workloads to maintain intensity without signs/symptoms of physical distress. Continue to progress workloads to maintain intensity without signs/symptoms of physical distress.      Resistance Training   Training Prescription -- Yes Yes Yes    Weight -- 3 3 3     Reps -- 10-15 10-15 10-15      Treadmill   MPH -- 1 1.2 1.4    Grade -- 0 0 0    Minutes -- 15 15 15     METs -- 1.77 1.92 2.07      NuStep   Level -- 1 2 2     SPM -- -- 38 66    Minutes -- 15 15 15     METs -- 1.4 1.8 1.6       Exercise Comments:   Exercise Comments     Row Name 11/06/23 1337           Exercise Comments First full day of exercise!  Patient was oriented to gym and equipment including functions, settings, policies, and procedures.  Patient's individual exercise prescription and treatment plan were reviewed.  All starting workloads were established based on the results of the 6 minute walk test done at initial orientation visit.  The plan for exercise progression was also introduced and progression will be customized based on patient's performance and goals.          Exercise Goals and Review:   Exercise Goals     Row Name 10/31/23 1510             Exercise Goals   Increase Physical Activity Yes       Intervention Provide advice, education, support and counseling about physical activity/exercise needs.;Develop an individualized exercise prescription for aerobic and resistive training based on initial evaluation findings, risk stratification, comorbidities and participant's personal goals.       Expected Outcomes Short Term: Attend rehab on a regular basis to increase amount of physical activity.;Long Term: Exercising regularly at least 3-5 days a week.;Long Term: Add in home exercise to  make exercise part of routine and to increase amount of physical activity.       Increase Strength and Stamina Yes       Intervention Provide advice, education, support and counseling about physical activity/exercise needs.;Develop an individualized exercise prescription for aerobic and resistive training based on initial evaluation findings, risk stratification, comorbidities and participant's personal goals.       Expected Outcomes Short Term: Increase workloads from initial exercise prescription for resistance, speed, and METs.;Short Term: Perform resistance training exercises routinely during rehab and add in resistance training at home;Long Term: Improve cardiorespiratory fitness, muscular endurance and strength as measured by increased METs and functional capacity ( )       Able to understand and use rate of perceived exertion (RPE) scale Yes       Intervention Provide education and explanation on how to use RPE scale       Expected Outcomes Short Term: Able to use RPE daily in rehab to express subjective intensity level;Long Term:  Able to use RPE to guide intensity level when exercising independently       Able to understand and use Dyspnea scale Yes       Intervention Provide education and explanation on how to use Dyspnea scale       Expected Outcomes Short Term: Able to use Dyspnea scale daily in rehab to express subjective sense of shortness of breath during exertion;Long Term: Able to use Dyspnea scale to guide intensity level when exercising independently       Knowledge and understanding of Target Heart Rate Range (THRR) Yes       Intervention Provide education and explanation of THRR including how the numbers were predicted and where they are located for reference       Expected Outcomes Short Term: Able to state/look up THRR;Long Term: Able to use THRR to govern intensity when exercising independently;Short Term: Able to use daily as guideline for intensity in rehab       Able to  check pulse independently Yes       Intervention Provide education and demonstration on how to check pulse in carotid and radial arteries.;Review the importance of being able to check your own pulse for safety during independent exercise       Expected Outcomes Short Term: Able to explain why pulse checking is important during independent exercise;Long Term: Able to check pulse independently and accurately       Understanding of Exercise Prescription Yes       Intervention Provide education, explanation, and written materials on patient's  individual exercise prescription       Expected Outcomes Short Term: Able to explain program exercise prescription;Long Term: Able to explain home exercise prescription to exercise independently          Exercise Goals Re-Evaluation :  Exercise Goals Re-Evaluation     Row Name 12/11/23 1443 01/08/24 1521           Exercise Goal Re-Evaluation   Exercise Goals Review Increase Physical Activity;Increase Strength and Stamina;Able to understand and use rate of perceived exertion (RPE) scale;Knowledge and understanding of Target Heart Rate Range (THRR);Understanding of Exercise Prescription;Able to check pulse independently Increase Physical Activity;Increase Strength and Stamina;Able to understand and use Dyspnea scale;Able to check pulse independently;Understanding of Exercise Prescription;Knowledge and understanding of Target Heart Rate Range (THRR);Able to understand and use rate of perceived exertion (RPE) scale      Comments Elba is doing great in rehab. She does notes she is not excercising at home at all. I asked if there was anywhere she could walk at home and she noted that [place] road that she could walk. Jonquil is doing good in rehab. She notes she is still not exercising at home, she is still in the process of moving so she says she is lifting boxes. Mentioned again to her about trying to at least walk everyday for 15-30 minutes.       Expected Outcomes Short: continue to attend rehab. Long: Walking everyday for at least 30 minutes. Short: continue to attend rehab. Long: Walking everyday for at least 30 minutes.          Discharge Exercise Prescription (Final Exercise Prescription Changes):  Exercise Prescription Changes - 01/03/24 1500       Response to Exercise   Blood Pressure (Admit) 110/60    Blood Pressure (Exit) 118/68    Heart Rate (Admit) 91 bpm    Heart Rate (Exercise) 101 bpm    Heart Rate (Exit) 72 bpm    Rating of Perceived Exertion (Exercise) 12    Duration Continue with 30 min of aerobic exercise without signs/symptoms of physical distress.    Intensity THRR unchanged      Progression   Progression Continue to progress workloads to maintain intensity without signs/symptoms of physical distress.      Resistance Training   Training Prescription Yes    Weight 3    Reps 10-15      Treadmill   MPH 1.4    Grade 0    Minutes 15    METs 2.07      NuStep   Level 2    SPM 66    Minutes 15    METs 1.6          Nutrition:  Target Goals: Understanding of nutrition guidelines, daily intake of sodium 1500mg , cholesterol 200mg , calories 30% from fat and 7% or less from saturated fats, daily to have 5 or more servings of fruits and vegetables.  Biometrics:  Pre Biometrics - 10/31/23 1510       Pre Biometrics   Height 5' 0.5 (1.537 m)    Weight 158 lb (71.7 kg)    Waist Circumference 35.25 inches    Hip Circumference 41.25 inches    Waist to Hip Ratio 0.85 %    BMI (Calculated) 30.34    Grip Strength 9.1 kg    Single Leg Stand 4 seconds           Nutrition Therapy Plan and Nutrition Goals:  Nutrition  Therapy & Goals - 10/31/23 1515       Intervention Plan   Intervention Prescribe, educate and counsel regarding individualized specific dietary modifications aiming towards targeted core components such as weight, hypertension, lipid management, diabetes, heart failure and other  comorbidities.;Nutrition handout(s) given to patient.    Expected Outcomes Short Term Goal: Understand basic principles of dietary content, such as calories, fat, sodium, cholesterol and nutrients.;Long Term Goal: Adherence to prescribed nutrition plan.          Nutrition Assessments:  MEDIFICTS Score Key: >=70 Need to make dietary changes  40-70 Heart Healthy Diet <= 40 Therapeutic Level Cholesterol Diet  Flowsheet Row CARDIAC REHAB PHASE II ORIENTATION from 10/31/2023 in [facility] CARDIAC REHABILITATION  Picture Your Plate Total Score on Admission 60   Picture Your Plate Scores: <59 Unhealthy dietary pattern with much room for improvement. 41-50 Dietary pattern unlikely to meet recommendations for good health and room for improvement. 51-60 More healthful dietary pattern, with some room for improvement.  >60 Healthy dietary pattern, although there may be some specific behaviors that could be improved.    Nutrition Goals Re-Evaluation:  Nutrition Goals Re-Evaluation     Row Name 12/11/23 1435 01/08/24 1514           Goals   Comment Sophronia notes she is eating pretty good. She says she is watching her salt and sugar intake due to her high b/p and diabeties. Myangel still is trying to watch her salt and sugar intake. She notes salt has been harder to cut back than sugar. Ece likes to add salt to her foods, spoke with her about trying to stop adding salt.      Expected Outcome Short: continue to monitor salts and sugars in diet. Long: maintain healthy diet and eat well balanced meals. Short: To stop adding salt to foods. Long: maintain healthy diet and eat well balanced meals.         Nutrition Goals Discharge (Final Nutrition Goals Re-Evaluation):  Nutrition Goals Re-Evaluation - 01/08/24 1514       Goals   Comment Ian still is trying to watch her salt and sugar intake. She notes salt has been harder to cut back than sugar. Rikayla likes to add salt to her foods,  spoke with her about trying to stop adding salt.    Expected Outcome Short: To stop adding salt to foods. Long: maintain healthy diet and eat well balanced meals.          Psychosocial: Target Goals: Acknowledge presence or absence of significant depression and/or stress, maximize coping skills, provide positive support system. Participant is able to verbalize types and ability to use techniques and skills needed for reducing stress and depression.  Initial Review & Psychosocial Screening:  Initial Psych Review & Screening - 10/31/23 1418       Initial Review   Current issues with History of Depression;Current Stress Concerns    Source of Stress Concerns Unable to participate in former interests or hobbies;Unable to perform yard/household activities;Financial    Comments In process of moving.  Was living with daughter in [place], moving to [place] to be closer to work      [employer]   Good Support System? Yes   Daughter, friends from church     Barriers   Psychosocial barriers to participate in program Psychosocial barriers identified (see note);The patient should benefit from training in stress management and relaxation.      Screening Interventions   Interventions Encouraged to  exercise;To provide support and resources with identified psychosocial needs;Provide feedback about the scores to participant    Expected Outcomes Short Term goal: Utilizing psychosocial counselor, staff and physician to assist with identification of specific Stressors or current issues interfering with healing process. Setting desired goal for each stressor or current issue identified.;Long Term Goal: Stressors or current issues are controlled or eliminated.;Short Term goal: Identification and review with participant of any Quality of Life or Depression concerns found by scoring the questionnaire.;Long Term goal: The participant improves quality of Life and PHQ9 Scores as seen by post scores and/or  verbalization of changes          Quality of Life Scores:  Quality of Life - 10/31/23 1514       Quality of Life   Select Quality of Life      Quality of Life Scores   Health/Function Pre 22.17 %    Socioeconomic Pre 19 %    Psych/Spiritual Pre 22.71 %    Family Pre 25.8 %    GLOBAL Pre 22.07 %         Scores of 19 and below usually indicate a poorer quality of life in these areas.  A difference of  2-3 points is a clinically meaningful difference.  A difference of 2-3 points in the total score of the Quality of Life Index has been associated with significant improvement in overall quality of life, self-image, physical symptoms, and general health in studies assessing change in quality of life.  PHQ-9: Review Flowsheet  More data exists      01/08/2024 01/03/2024 12/31/2023 11/27/2023 10/31/2023  Depression screen PHQ 2/9  Decreased Interest 0 0 0 1 1  Down, Depressed, Hopeless 0 0 0 1 1  PHQ - 2 Score 0 0 0 2 2  Altered sleeping 0 0 2 1 0  Tired, decreased energy 0 0 0 2 3  Change in appetite 0 0 0 0 0  Feeling bad or failure about yourself  0 0 2 1 1   Trouble concentrating 0 0 2 1 1   Moving slowly or fidgety/restless 0 0 0 0 0  Suicidal thoughts 0 0 0 0 0  PHQ-9 Score 0 0 6 7 7   Difficult doing work/chores Not difficult at all Not difficult at all Somewhat difficult Somewhat difficult Somewhat difficult   Interpretation of Total Score  Total Score Depression Severity:  1-4 = Minimal depression, 5-9 = Mild depression, 10-14 = Moderate depression, 15-19 = Moderately severe depression, 20-27 = Severe depression   Psychosocial Evaluation and Intervention:  Psychosocial Evaluation - 10/31/23 1511       Psychosocial Evaluation & Interventions   Comments Lachandra is coming into cardiac rehab after having a stent placed in June.  She is also in the middle of moving from [place] to [place] to be closer to work.  She was living with her daughter (her biggest supporter) but  wanted to move out to be independent and close to work since she goes in 4 days a week. 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  She is eager to build her strength and stamina back up. She wants to get into a routine for herself again.  She usually sleeps well.  She does not precieve any issues with getting to rehab reguarly.  She does have a copay and will let us  know if it gets to be too much. She has a history of meds but is no longer needing them.  She is also off of her type 2 DM meds which she is very proud of herself.    Expected Outcomes Short: Attend rehab to build up stamina. Long: Continue to improve strength    Continue Psychosocial Services  Follow up required by staff          Psychosocial Re-Evaluation:  Psychosocial Re-Evaluation     Row Name 12/11/23 1432 01/08/24 1507           Psychosocial Re-Evaluation   Current issues with Current Sleep Concerns;Current Stress Concerns Current Stress Concerns;Current Sleep Concerns      Comments Ladonne notes she is in the middle of moving, she is moving out of her daughters house into a house to herself, so she is having some stress from that. She also notes she wakes up 2-3 times a night and she thinks it is her b/p meds causing this. Railee is still moving currently, but notes she is mostly finished. Her new home is smaller than her old home so she is adjusting to that, but her new home is closer to her work so that is nice for her. She notes her sleep is still rough waking up around 2 am most mornings and can't go back to sleep. Her doctor added another b/p med for her but she doesn't think it's helping.      Expected Outcomes Short: speak with her PCP about her b/p meds and her sleeping concerns. Long: continue to have healthy stress outlets. Short: speak with her PCP about her sleeping concerns. Long: continue to have healthy stress outlets.      Interventions Relaxation education;Encouraged to attend Cardiac  Rehabilitation for the exercise;Stress management education Encouraged to attend Cardiac Rehabilitation for the exercise      Continue Psychosocial Services  Follow up required by staff Follow up required by staff         Psychosocial Discharge (Final Psychosocial Re-Evaluation):  Psychosocial Re-Evaluation - 01/08/24 1507       Psychosocial Re-Evaluation   Current issues with Current Stress Concerns;Current Sleep Concerns    Comments Sammye is still moving currently, but notes she is mostly finished. Her new home is smaller than her old home so she is adjusting to that, but her new home is closer to her work so that is nice for her. She notes her sleep is still rough waking up around 2 am most mornings and can't go back to sleep. Her doctor added another b/p med for her but she doesn't think it's helping.    Expected Outcomes Short: speak with her PCP about her sleeping concerns. Long: continue to have healthy stress outlets.    Interventions Encouraged to attend Cardiac Rehabilitation for the exercise    Continue Psychosocial Services  Follow up required by staff          Vocational Rehabilitation: Provide vocational rehab assistance to qualifying candidates.   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 Participant will state understanding/return demonstration of topics presented.  Learning Barriers/Preferences:  Learning Barriers/Preferences - 10/31/23 1416       Learning Barriers/Preferences   Learning Barriers None    Learning Preferences None  Education Topics: Hypertension, Hypertension Reduction -Define heart disease and high blood pressure. Discus how  high blood pressure affects the body and ways to reduce high blood pressure.   Exercise and Your Heart -Discuss why it is important to exercise, the FITT principles of exercise, normal and abnormal responses to exercise, and how to exercise safely.   Angina -Discuss definition of angina, causes of angina, treatment of angina, and how to decrease risk of having angina.   Cardiac Medications -Review what the following cardiac medications are used for, how they affect the body, and side effects that may occur when taking the medications.  Medications include Aspirin , Beta blockers, calcium  channel blockers, ACE Inhibitors, angiotensin receptor blockers, diuretics, digoxin, and antihyperlipidemics. Flowsheet Row CARDIAC REHAB PHASE II EXERCISE from 11/08/2023 in [facility]  Date 11/08/23  Educator [facility]  Instruction Review Code 1- Verbalizes Understanding    Congestive Heart Failure -Discuss the definition of CHF, how to live with CHF, the signs and symptoms of CHF, and how keep track of weight and sodium intake.   Heart Disease and Intimacy -Discus the effect sexual activity has on the heart, how changes occur during intimacy as we age, and safety during sexual activity.   Smoking Cessation / COPD -Discuss different methods to quit smoking, the health benefits of quitting smoking, and the definition of COPD.   Nutrition I: Fats -Discuss the types of cholesterol, what cholesterol does to the heart, and how cholesterol levels can be controlled.   Nutrition II: Labels -Discuss the different components of food labels and how to read food label   Heart Parts/Heart Disease and PAD -Discuss the anatomy of the heart, the pathway of blood circulation through the heart, and these are affected by heart disease.   Stress I: Signs and Symptoms -Discuss the causes of stress, how stress may lead to anxiety and depression, and ways to limit stress.   Stress II:  Relaxation -Discuss different types of relaxation techniques to limit stress.   Warning Signs of Stroke / TIA -Discuss definition of a stroke, what the signs and symptoms are of a stroke, and how to identify when someone is having stroke.   Knowledge Questionnaire Score:  Knowledge Questionnaire Score - 10/31/23 1515       Knowledge Questionnaire Score   Pre Score 22/26          Core Components/Risk Factors/Patient Goals at Admission:  Personal Goals and Risk Factors at Admission - 10/31/23 1515       Core Components/Risk Factors/Patient Goals on Admission    Weight Management Yes;Weight Loss;Obesity    Intervention Weight Management: Develop a combined nutrition and exercise program designed to reach desired caloric intake, while maintaining appropriate intake of nutrient and fiber, sodium and fats, and appropriate energy expenditure required for the weight goal.;Weight Management: Provide education and appropriate resources to help participant work on and attain dietary goals.;Weight Management/Obesity: Establish reasonable short term and long term weight goals.;Obesity: Provide education and appropriate resources to help participant work on and attain dietary goals.    Admit Weight 158 lb (71.7 kg)    Goal Weight: Short Term 155 lb (70.3 kg)    Goal Weight: Long Term 150 lb (68 kg)    Expected Outcomes Short Term: Continue to assess and modify interventions until short term weight is achieved;Long Term: Adherence to nutrition and physical activity/exercise program aimed toward attainment of established weight goal;Weight Loss: Understanding of general recommendations for a balanced deficit meal plan, which promotes  1-2 lb weight loss per week and includes a negative energy balance of [phone] kcal/d;Understanding recommendations for meals to include 15-35% energy as protein, 25-35% energy from fat, 35-60% energy from carbohydrates, less than 200mg  of dietary cholesterol, 20-35 gm of  total fiber daily;Understanding of distribution of calorie intake throughout the day with the consumption of 4-5 meals/snacks    Improve shortness of breath with ADL's Yes    Intervention Provide education, individualized exercise plan and daily activity instruction to help decrease symptoms of SOB with activities of daily living.    Expected Outcomes Short Term: Improve cardiorespiratory fitness to achieve a reduction of symptoms when performing ADLs;Long Term: Be able to perform more ADLs without symptoms or delay the onset of symptoms    Diabetes Yes   no longer on meds or checking sugars   Intervention Provide education about signs/symptoms and action to take for hypo/hyperglycemia.;Provide education about proper nutrition, including hydration, and aerobic/resistive exercise prescription along with prescribed medications to achieve blood glucose in normal ranges: Fasting glucose 65-99 mg/dL    Expected Outcomes Short Term: Participant verbalizes understanding of the signs/symptoms and immediate care of hyper/hypoglycemia, proper foot care and importance of medication, aerobic/resistive exercise and nutrition plan for blood glucose control.    Heart Failure Yes    Intervention Provide a combined exercise and nutrition program that is supplemented with education, support and counseling about heart failure. Directed toward relieving symptoms such as shortness of breath, decreased exercise tolerance, and extremity edema.    Expected Outcomes Improve functional capacity of life;Short term: Attendance in program 2-3 days a week with increased exercise capacity. Reported lower sodium intake. Reported increased fruit and vegetable intake. Reports medication compliance.;Short term: Daily weights obtained and reported for increase. Utilizing diuretic protocols set by physician.;Long term: Adoption of self-care skills and reduction of barriers for early signs and symptoms recognition and intervention leading to  self-care maintenance.    Hypertension Yes    Intervention Provide education on lifestyle modifcations including regular physical activity/exercise, weight management, moderate sodium restriction and increased consumption of fresh fruit, vegetables, and low fat dairy, alcohol  moderation, and smoking cessation.;Monitor prescription use compliance.    Expected Outcomes Short Term: Continued assessment and intervention until BP is < 140/40mm HG in hypertensive participants. < 130/18mm HG in hypertensive participants with diabetes, heart failure or chronic kidney disease.;Long Term: Maintenance of blood pressure at goal levels.    Lipids Yes    Intervention Provide education and support for participant on nutrition & aerobic/resistive exercise along with prescribed medications to achieve LDL 70mg , HDL >40mg .    Expected Outcomes Short Term: Participant states understanding of desired cholesterol values and is compliant with medications prescribed. Participant is following exercise prescription and nutrition guidelines.;Long Term: Cholesterol controlled with medications as prescribed, with individualized exercise RX and with personalized nutrition plan. Value goals: LDL < 70mg , HDL > 40 mg.          Core Components/Risk Factors/Patient Goals Review:   Goals and Risk Factor Review     Row Name 12/11/23 1438 01/08/24 1517           Core Components/Risk Factors/Patient Goals Review   Personal Goals Review Hypertension;Diabetes Improve shortness of breath with ADL's;Develop more efficient breathing techniques such as purse lipped breathing and diaphragmatic breathing and practicing self-pacing with activity.;Hypertension;Diabetes      Review Bria notes she is on 3 different b/p meds. Also notes she has type 2 diabeties and she is on no medication for,she also does  not check her sugar at home. I mentioned to her to speak with her PCP about checking her A1C. Kamrin's PCP added Fargixa to her meds for  her diabetes, she is still not checking her blood sugar at home, but plans to start, mentioned to her it is important to know her blood glucose levels. She also notes she is not checking her b/p at home much either, mentioned to her that was alos just as important especially since she is on 3 different b/p meds.      Expected Outcomes Short: communicate with PCP about checking her A1C. Long: continue to follow up with her PCP and follow medication prescriptions. Short: start checking her b/p and blood glucose daily at home. Long: continue to follow up with her PCP and follow medication prescriptions.         Core Components/Risk Factors/Patient Goals at Discharge (Final Review):   Goals and Risk Factor Review - 01/08/24 1517       Core Components/Risk Factors/Patient Goals Review   Personal Goals Review Improve shortness of breath with ADL's;Develop more efficient breathing techniques such as purse lipped breathing and diaphragmatic breathing and practicing self-pacing with activity.;Hypertension;Diabetes    Review Evora's PCP added Fargixa to her meds for her diabetes, she is still not checking her blood sugar at home, but plans to start, mentioned to her it is important to know her blood glucose levels. She also notes she is not checking her b/p at home much either, mentioned to her that was alos just as important especially since she is on 3 different b/p meds.    Expected Outcomes Short: start checking her b/p and blood glucose daily at home. Long: continue to follow up with her PCP and follow medication prescriptions.          ITP Comments:  ITP Comments     Row Name 10/31/23 1503 11/06/23 1337 11/21/23 1248 12/19/23 1519 01/16/24 1817   ITP Comments Patient attend orientation today.  Patient is attending Cardiac Rehabilitation Program.  Documentation for diagnosis can be found in 10/05/23.  Reviewed medical chart, RPE/RPD, gym safety, and program guidelines.  Patient was fitted to  equipment they will be using during rehab.  Patient is scheduled to start exercise on Tuesday 11/06/23 at 1330.   Initial ITP created and sent for review and signature by Dr. Dorn Ross, Medical Director for Cardiac Rehabilitation Program. First full day of exercise!  Patient was oriented to gym and equipment including functions, settings, policies, and procedures.  Patient's individual exercise prescription and treatment plan were reviewed.  All starting workloads were established based on the results of the 6 minute walk test done at initial orientation visit.  The plan for exercise progression was also introduced and progression will be customized based on patient's performance and goals. 30 day review completed. ITP sent to Dr. Dorn Ross, Medical Director of Cardiac Rehab. Continue with ITP unless changes are made by physician.  Newer to program. 30 day review completed. ITP sent to Dr. Dorn Ross, Medical Director of Cardiac Rehab. Continue with ITP unless changes are made by physician. 30 day review completed. ITP sent to Dr. Dorn Ross, Medical Director of Cardiac Rehab. Continue with ITP unless changes are made by physician.      Comments: 30 day review

## 2024-01-17 ENCOUNTER — Encounter (HOSPITAL_COMMUNITY)

## 2024-01-17 NOTE — Progress Notes (Signed)
 Care Guide Pharmacy Note  01/17/2024 Name: [name] MRN: [id number] DOB: January 31, 1946  Referred By: Lavell Bari LABOR, FNP Reason for referral: Complex Care Management (Outreach to schedule with Pharm d )   [name] is a 77 y.o. year old female who is a primary care patient of Lavell Bari LABOR, FNP.  [name] was referred to the pharmacist for assistance related to: HTN and DMII  Successful contact was made with the patient to discuss pharmacy services including being ready for the pharmacist to call at least 5 minutes before the scheduled appointment time and to have medication bottles and any blood pressure readings ready for review. The patient agreed to meet with the pharmacist via telephone visit on (date/time). 02/01/2024  Jeoffrey Buffalo , RMA     [place]  [facility], [facility] Guide  Direct Dial: [phone]  Website: [email or web address]

## 2024-01-22 ENCOUNTER — Encounter (HOSPITAL_COMMUNITY)

## 2024-01-22 ENCOUNTER — Encounter (HOSPITAL_COMMUNITY)
Admission: RE | Admit: 2024-01-22 | Discharge: 2024-01-22 | Disposition: A | Discharge status: Home / Self Care | Source: Ambulatory Visit | Attending: Cardiology | Admitting: Cardiology

## 2024-01-22 DIAGNOSIS — Z955 Presence of coronary angioplasty implant and graft: Secondary | ICD-10-CM

## 2024-01-22 NOTE — Progress Notes (Signed)
 Daily Session Note  Patient Details  Name: [name] MRN: [id number] Date of Birth: January 06, 1946 Referring Provider:   Flowsheet Row CARDIAC REHAB PHASE II ORIENTATION from 10/31/2023 in [facility]  Referring Provider Lavona Agent MD    Encounter Date: 01/22/2024  Check In:  Session Check In - 01/22/24 1458       Check-In   Supervising physician immediately available to respond to emergencies See telemetry face sheet for immediately available MD    Location [facility]    Staff Present Powell Benders, BS, Exercise Physiologist;Merl Bommarito Jackquline, BSN, RN, WTA-C;Victoria Zina, RN    Virtual Visit No    Medication changes reported     No    Fall or balance concerns reported    No    Tobacco Cessation No Change    Warm-up and Cool-down Performed on first and last piece of equipment    Resistance Training Performed Yes    VAD Patient? No    PAD/SET Patient? No      Pain Assessment   Currently in Pain? Yes    Pain Score 5     Pain Location Leg    Pain Orientation Right    Pain Radiating Towards Pt has cellulitis on right leg    Multiple Pain Sites No          Capillary Blood Glucose: No results found for this or any previous visit (from the past 24 hours).    Social History   Tobacco Use  Smoking Status Never  Smokeless Tobacco Never    Goals Met:  Independence with exercise equipment Exercise tolerated well No report of concerns or symptoms today Strength training completed today  Goals Unmet:  Not Applicable  Comments: Pt able to follow exercise prescription today without complaint.  Will continue to monitor for progression.

## 2024-01-24 ENCOUNTER — Encounter (HOSPITAL_COMMUNITY)

## 2024-01-24 ENCOUNTER — Telehealth (HOSPITAL_COMMUNITY): Payer: Self-pay | Admitting: Surgery

## 2024-01-24 NOTE — Telephone Encounter (Signed)
 I called the pt since she missed her cardiac rehab session today.  I left the pt a voicemail asking her to call our office back.

## 2024-01-29 ENCOUNTER — Encounter (HOSPITAL_COMMUNITY)

## 2024-01-31 ENCOUNTER — Encounter (HOSPITAL_COMMUNITY)
Admission: RE | Admit: 2024-01-31 | Discharge: 2024-01-31 | Disposition: A | Discharge status: Home / Self Care | Source: Ambulatory Visit | Attending: Cardiology | Admitting: Cardiology

## 2024-01-31 ENCOUNTER — Encounter (HOSPITAL_COMMUNITY)

## 2024-01-31 DIAGNOSIS — Z955 Presence of coronary angioplasty implant and graft: Secondary | ICD-10-CM

## 2024-01-31 NOTE — Progress Notes (Signed)
 Daily Session Note  Patient Details  Name: [name] MRN: [id number] Date of Birth: Mar 30, 1946 Referring Provider:   Flowsheet Row CARDIAC REHAB PHASE II ORIENTATION from 10/31/2023 in [facility] CARDIAC REH[facility]ATION  Referring Provider Lavona Agent MD    Encounter Date: 01/31/2024  Check In:  Session Check In - 01/31/24 1536       Check-In   Supervising physician immediately available to respond to emergencies See telemetry face sheet for immediately available MD    Location [facility]    Staff Present Powell Benders, BS, Exercise Physiologist;Kimimila Tauzin Vonzell, MA, RCEP, CCRP, Sueellen Louder, RN, BSN    Virtual Visit No    Medication changes reported     No    Fall or balance concerns reported    No    Warm-up and Cool-down Performed on first and last piece of equipment    Resistance Training Performed Yes    VAD Patient? No    PAD/SET Patient? No      Pain Assessment   Currently in Pain? No/denies          Capillary Blood Glucose: No results found for this or any previous visit (from the past 24 hours).    Social History   Tobacco Use  Smoking Status Never  Smokeless Tobacco Never    Goals Met:  Independence with exercise equipment Exercise tolerated well No report of concerns or symptoms today Strength training completed today  Goals Unmet:  Not Applicable  Comments: Pt able to follow exercise prescription today without complaint.  Will continue to monitor for progression.

## 2024-02-01 ENCOUNTER — Other Ambulatory Visit (INDEPENDENT_AMBULATORY_CARE_PROVIDER_SITE_OTHER)

## 2024-02-01 ENCOUNTER — Telehealth: Payer: Self-pay | Admitting: Pharmacist

## 2024-02-01 DIAGNOSIS — E119 Type 2 diabetes mellitus without complications: Secondary | ICD-10-CM

## 2024-02-01 DIAGNOSIS — E1169 Type 2 diabetes mellitus with other specified complication: Secondary | ICD-10-CM

## 2024-02-01 DIAGNOSIS — Z7984 Long term (current) use of oral hypoglycemic drugs: Secondary | ICD-10-CM

## 2024-02-01 MED ORDER — DAPAGLIFLOZIN PROPANEDIOL 10 MG PO TABS: 10.0000 mg | ORAL_TABLET | Freq: Every day | ORAL | 3 refills | Status: DC

## 2024-02-01 NOTE — Telephone Encounter (Signed)
 New enrollment for Farxiga  AZ&me PAP Please route me PAP eRX sent to [facility] Thank you!

## 2024-02-01 NOTE — Progress Notes (Signed)
 02/01/2024 Name: [name] MRN: [id number] DOB: 1946/01/28  Chief Complaint  Patient presents with   Diabetes    [name] is a 78 y.o. year old female who presented for a telephone visit.  I connected with  [name] on 02/01/24 by telephone and verified that I am speaking with the correct person using two identifiers. I discussed the limitations of evaluation and management by telemedicine. The patient expressed understanding and agreed to proceed.  Patient was located in her home and PharmD in PCP office during this visit.   They were referred to the pharmacist by their PCP for assistance in managing diabetes, medication access, and CKD.    Subjective:  Care Team: Primary Care Provider: Lavell Bari LABOR, FNP ; Next Scheduled Visit:    Medication Access/Adherence  Current Pharmacy:  [facility] [address] [place] - [address] [place] [address] Phone: [phone] Fax: [phone]  [facility] [place], [place] - [address] [place] [address] Phone: [phone] Fax: [phone]  Patient reports affordability concerns with their medications: Yes -Farxiga  Patient reports access/transportation concerns to their pharmacy: No  Patient reports adherence concerns with their medications:  No     Diabetes:  Current medications: none Medications tried in the past: metformin   Current glucose readings: FBG<130  Patient denies hypoglycemic s/sx including dizziness, shakiness, sweating. Patient denies hyperglycemic symptoms including polyuria, polydipsia, polyphagia, nocturia, neuropathy, blurred vision.  Current meal patterns:  Discussed meal planning options and Plate method for healthy eating Avoid sugary drinks and desserts Incorporate balanced protein, non starchy veggies, 1 serving of carbohydrate with each meal Increase water  intake Increase physical activity as able  Current physical  activity: encouraged as able  Current medication access support: will enroll in AZ&me PAP  Macrovascular and Microvascular Risk Reduction:  Statin? yes (rosuvastatin ); ACEi/ARB? yes (ENTRESTO ) Last urinary albumin/creatinine ratio:  Lab Results  Component Value Date   MICRALBCREAT 51 (H) 12/31/2023   MICRALBCREAT 117 (H) 11/16/2022   MICRALBCREAT 145 (H) 05/05/2021   MICRALBCREAT 262 (H) 02/07/2019   MICRALBCREAT 221.8 (H) 01/22/2018   Last eye exam:  Lab Results  Component Value Date   HMDIABEYEEXA  11/02/2022     Comment:     Unable to determine  Abstracted by HIM   Last foot exam: 12/31/2023 Tobacco Use:  Tobacco Use: Low Risk  (01/08/2024)   Patient History    Smoking Tobacco Use: Never    Smokeless Tobacco Use: Never    Passive Exposure: Not on file     Objective:  Lab Results  Component Value Date   HGBA1C 6.8 (H) 12/31/2023    Lab Results  Component Value Date   CREATININE 0.99 12/31/2023   BUN 20 12/31/2023   NA 144 12/31/2023   K 4.0 12/31/2023   CL 108 (H) 12/31/2023   CO2 21 12/31/2023    Lab Results  Component Value Date   CHOL 166 12/31/2023   HDL 42 12/31/2023   LDLCALC 95 12/31/2023   TRIG 168 (H) 12/31/2023   CHOLHDL 4.0 12/31/2023    Medications Reviewed Today     Reviewed by Billee Mliss BIRCH, [facility] (Pharmacist) on 02/01/24 at 1429  Med List Status: <None>   Medication Order Taking? Sig Documenting Provider Last Dose Status Informant  acetaminophen  (TYLENOL ) 500 MG tablet [id number]  Take 1,000 mg by mouth every 6 (six) hours as needed for moderate pain  or headache. [provider]  Active Self  albuterol  (PROVENTIL ) (2.5 MG/3ML) 0.083% nebulizer solution [id number]  Take 3 mLs (2.5 mg total) by nebulization every 6 (six) hours as needed for wheezing or shortness of breath. Lavell Bari LABOR, FNP  Active Self  albuterol  (VENTOLIN  HFA) 108 (90 Base) MCG/ACT inhaler [id number]  Inhale 2 puffs into the lungs every 6 (six) hours as  needed for wheezing or shortness of breath. Lavell Bari A, FNP  Active Self  amLODipine  (NORVASC ) 10 MG tablet [id number]  Take 1 tablet (10 mg total) by mouth daily. Lavell Bari LABOR, FNP  Active Self           Med Note BOYKIN, DARRELL R   Fri Aug 24, 2023 10:46 AM)    apixaban  (ELIQUIS ) 5 MG TABS tablet [id number]  Take 1 tablet (5 mg total) by mouth 2 (two) times daily. Lavell Bari A, FNP  Active   aspirin  81 MG chewable tablet [id number]  Chew 1 tablet (81 mg total) by mouth daily. Darryle Thom CROME, PA-C  Active   bisoprolol  (ZEBETA ) 5 MG tablet [id number]  Take 1 tablet (5 mg total) by mouth daily. Daneen Damien BROCKS, NP  Active   clopidogrel  (PLAVIX ) 75 MG tablet [id number]  Take 1 tablet (75 mg total) by mouth daily with breakfast. Darryle Thom CROME, PA-C  Active   dapagliflozin  propanediol (FARXIGA ) 10 MG TABS tablet [id number]  Take 1 tablet (10 mg total) by mouth daily. Lavell Bari LABOR, FNP  Active   erythromycin  ophthalmic ointment [id number]  Place 1 Application into the right eye at bedtime. [facility] [place] Hummer, Nena, NP  Active   furosemide  (LASIX ) 20 MG tablet [id number]  Take 1 tablet (20 mg total) by mouth daily. Lavell Bari A, FNP  Active   glucose blood (ONETOUCH VERIO) test strip [id number]  Use as instructed Lavell Bari LABOR, FNP  Active Self  isosorbide  mononitrate (IMDUR ) 30 MG 24 hr tablet [id number]  Take 1 tablet (30 mg total) by mouth daily. Lavona Agent, MD  Active   nitroGLYCERIN  (NITROSTAT ) 0.4 MG SL tablet [id number]  Place 1 tablet (0.4 mg total) under the tongue every 5 (five) minutes as needed for chest pain. Darryle Thom CROME, PA-C  Active   omeprazole  (PRILOSEC) 20 MG capsule [id number]  Take 1 capsule (20 mg total) by mouth daily. Lavell Bari LABOR, FNP  Active   [facility] DELICA LANCETS FINE [place] [id number]  Use to check BG once daily Eckard, Tammy, RPH-CPP  Active Self  rosuvastatin  (CRESTOR ) 40 MG tablet [id number]  Take 1 tablet (40 mg total) by mouth daily. Darryle Thom CROME, PA-C  Active   sacubitril -valsartan  (ENTRESTO ) 97-103 MG [id number]  Take 1 tablet by mouth 2 (two) times daily. Lavona Agent, MD  Active   spironolactone  (ALDACTONE ) 25 MG tablet [id number]  Take 1 tablet (25 mg total) by mouth daily. Darryle Thom CROME, PA-C  Active   triamcinolone  ointment (KENALOG ) 0.5 % [id number]  Apply 1 Application topically 2 (two) times daily. Lavell Bari A, FNP  Active              Assessment/Plan:   Diabetes: - Currently controlled; goal A1c <7%. Cardiorenal risk reduction is opportunities for improvement.. Blood pressure is at goal <130/80. LDL is not at goal.  - Reviewed long term cardiovascular and renal outcomes of uncontrolled blood sugar., Reviewed goal A1c, goal fasting, and goal 2 hour post prandial glucose. Recommended to check glucose fasting or if symptomatic, and Reviewed dietary modifications  including healthy plate method. - Recommend to start Farxiga  for T2DM and microalbuminuria, CHF .--AZ&me PAP completed by PHarmD--watch incontinence, talking with urology about Gemtesa  -Also enrolled patient in the [employer] grant - Discussed potential side effects of dehydration, genitourinary infections., Advised on sick day rules (if a day with significantly reduced oral intake, serious vomiting, or diarrhea, hold SGLT2), Encouraged adequate hydration and genital hygiene.   Follow Up Plan: 3 weeks  Mliss Tarry Griffin, PharmD, BCACP, CPP Clinical Pharmacist, [facility]

## 2024-02-04 ENCOUNTER — Ambulatory Visit (INDEPENDENT_AMBULATORY_CARE_PROVIDER_SITE_OTHER): Admitting: Family

## 2024-02-04 ENCOUNTER — Ambulatory Visit (INDEPENDENT_AMBULATORY_CARE_PROVIDER_SITE_OTHER)

## 2024-02-04 ENCOUNTER — Telehealth: Payer: Self-pay

## 2024-02-04 ENCOUNTER — Encounter: Payer: Self-pay | Admitting: Family

## 2024-02-04 VITALS — BP 166/64 | HR 97 | Temp 96.8°F | Ht 64.0 in | Wt 158.8 lb

## 2024-02-04 DIAGNOSIS — Z13820 Encounter for screening for osteoporosis: Secondary | ICD-10-CM

## 2024-02-04 DIAGNOSIS — R600 Localized edema: Secondary | ICD-10-CM | POA: Diagnosis not present

## 2024-02-04 DIAGNOSIS — I10 Essential (primary) hypertension: Secondary | ICD-10-CM

## 2024-02-04 DIAGNOSIS — Z139 Encounter for screening, unspecified: Secondary | ICD-10-CM

## 2024-02-04 DIAGNOSIS — Z780 Asymptomatic menopausal state: Secondary | ICD-10-CM

## 2024-02-04 NOTE — Patient Instructions (Signed)
 Peripheral Edema  Peripheral edema is swelling that is caused by a buildup of fluid. Peripheral edema most often affects the lower legs, ankles, and feet. It can also develop in the arms, hands, and face. The area of the body that has peripheral edema will look swollen. It may also feel heavy or warm. Your clothes may start to feel tight. Pressing on the area may make a temporary dent in your skin (pitting edema). You may not be able to move your swollen arm or leg as much as usual. There are many causes of peripheral edema. It can happen because of a complication of other conditions such as heart failure, kidney disease, or a problem with your circulation. It also can be a side effect of certain medicines or happen because of an infection. It often happens to women during pregnancy. Sometimes, the cause is not known. Follow these instructions at home: Managing pain, stiffness, and swelling  Raise (elevate) your legs while you are sitting or lying down. Move around often to prevent stiffness and to reduce swelling. Do not sit or stand for long periods of time. Do not wear tight clothing. Do not wear garters on your upper legs. Exercise your legs to get your circulation going. This helps to move the fluid back into your blood vessels, and it may help the swelling go down. Wear compression stockings as told by your health care provider. These stockings help to prevent blood clots and reduce swelling in your legs. It is important that these are the correct size. These stockings should be prescribed by your doctor to prevent possible injuries. If elastic bandages or wraps are recommended, use them as told by your health care provider. Medicines Take over-the-counter and prescription medicines only as told by your health care provider. Your health care provider may prescribe medicine to help your body get rid of excess water (diuretic). Take this medicine if you are told to take it. General  instructions Eat a low-salt (low-sodium) diet as told by your health care provider. Sometimes, eating less salt may reduce swelling. Pay attention to any changes in your symptoms. Moisturize your skin daily to help prevent skin from cracking and draining. Keep all follow-up visits. This is important. Contact a health care provider if: You have a fever. You have swelling in only one leg. You have increased swelling, redness, or pain in one or both of your legs. You have drainage or sores at the area where you have edema. Get help right away if: You have edema that starts suddenly or is getting worse, especially if you are pregnant or have a medical condition. You develop shortness of breath, especially when you are lying down. You have pain in your chest or abdomen. You feel weak. You feel like you will faint. These symptoms may be an emergency. Get help right away. Call 911. Do not wait to see if the symptoms will go away. Do not drive yourself to the hospital. Summary Peripheral edema is swelling that is caused by a buildup of fluid. Peripheral edema most often affects the lower legs, ankles, and feet. Move around often to prevent stiffness and to reduce swelling. Do not sit or stand for long periods of time. Pay attention to any changes in your symptoms. Contact a health care provider if you have edema that starts suddenly or is getting worse, especially if you are pregnant or have a medical condition. Get help right away if you develop shortness of breath, especially when lying down.  This information is not intended to replace advice given to you by your health care provider. Make sure you discuss any questions you have with your health care provider. Document Revised: 11/29/2020 Document Reviewed: 11/29/2020 Elsevier Patient Education  2024 [employer].

## 2024-02-04 NOTE — Progress Notes (Signed)
 Subjective:    Patient ID: [name], female    DOB: Sep 06, 1945, 78 y.o.   MRN: [id number]  Chief Complaint  Patient presents with   Follow-up   PT presents to the office today to recheck peripheral edema. She was seen on 01/03/24 and we started lasix  20 mg. She reports her swelling is improved, but not resolved.   Complaining of intermittent tenderness of 7 out 10.  Hypertension This is a chronic problem. The current episode started more than 1 year ago. The problem has been waxing and waning since onset. The problem is uncontrolled. Associated symptoms include peripheral edema. Pertinent negatives include no malaise/fatigue or shortness of breath. Risk factors for coronary artery disease include dyslipidemia. Past treatments include calcium  channel blockers and diuretics. The current treatment provides mild improvement.      Review of Systems  Constitutional:  Negative for malaise/fatigue.  Respiratory:  Negative for shortness of breath.   All other systems reviewed and are negative.   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 06/19/21-Currently lives with daughter and is actively looking for a rental home.    1 son and 1 daughter. Son died in 03/02/2016.   2 grandchildren   Left handedNo caffiene   Social Drivers of Health   Financial Resource Strain: Low Risk   (01/08/2024)   Overall Financial Resource Strain (CARDIA)    Difficulty of Paying Living Expenses: Not hard at all  Food Insecurity: No Food Insecurity (01/08/2024)   Hunger Vital Sign    Worried About Running Out of Food in the Last Year: Never true    Ran Out of Food in the Last Year: Never true  Transportation Needs: No Transportation Needs (01/08/2024)   PRAPARE - Administrator, Civil Service (Medical): No    Lack of Transportation (Non-Medical): No  Physical Activity: Insufficiently Active (01/08/2024)   Exercise Vital Sign    Days of Exercise per Week: 3 days    Minutes of Exercise per Session: 30 min  Stress: Stress Concern Present (01/08/2024)   [employer] of Occupational Health - Occupational Stress Questionnaire    Feeling of Stress: Very much  Social Connections: Unknown (01/08/2024)   Social Connection and Isolation Panel    Frequency of Communication with Friends and Family: More than three times a week    Frequency of Social Gatherings with Friends and Family: More than three times a week    Attends Religious Services: More than 4 times per year    Active Member of [employer] or Organizations: Yes    Attends Engineer, Structural: More than 4 times per year    Marital Status: Patient declined   Family History  Problem Relation Age of Onset   Hypertension Mother    Arthritis  Mother    CAD Mother 16   Hip fracture Mother 23   COPD Father    Crohn's disease Sister    Cancer Sister        lung   Early death Brother 33       house fire   Diabetes Maternal Grandmother    Hypertension Sister    Hypertension Brother    Colon cancer Neg Hx         Objective:   Physical Exam Vitals reviewed.  Constitutional:      General: She is not in acute distress.    Appearance: She is well-developed.  HENT:     Head: Normocephalic and atraumatic.     Right Ear: Tympanic membrane normal.     Left Ear: Tympanic membrane normal.  Eyes:     Pupils: Pupils  are equal, round, and reactive to light.  Neck:     Thyroid : No thyromegaly.  Cardiovascular:     Rate and Rhythm: Normal rate and regular rhythm.     Heart sounds: Normal heart sounds. No murmur heard. Pulmonary:     Effort: Pulmonary effort is normal. No respiratory distress.     Breath sounds: Normal breath sounds. No wheezing.  Abdominal:     General: Bowel sounds are normal. There is no distension.     Palpations: Abdomen is soft.     Tenderness: There is no abdominal tenderness.  Musculoskeletal:        General: Tenderness present.     Cervical back: Normal range of motion and neck supple.     Right lower leg: Edema (3 +) present.     Left lower leg: Edema (2+) present.     Comments: Right leg erythemas   Skin:    General: Skin is warm and dry.  Neurological:     Mental Status: She is alert and oriented to person, place, and time.     Cranial Nerves: No cranial nerve deficit.     Deep Tendon Reflexes: Reflexes are normal and symmetric.  Psychiatric:        Behavior: Behavior normal.        Thought Content: Thought content normal.        Judgment: Judgment normal.       BP (!) 166/64   Pulse 97   Temp (!) 96.8 F (36 C) (Temporal)   Ht 5' 4 (1.626 m)   Wt 158 lb 12.8 oz (72 kg)   LMP 01/22/1981 (LMP Unknown)   SpO2 96%   BMI 27.26 kg/m      Assessment & Plan:  GLENNIS BORGER comes in today with chief complaint of Follow-up   Diagnosis and orders addressed:  1. Peripheral edema (Primary) Continue Lasix  20 mg  Wear compression hose daily Force fluids - BMP8+EGFR  2. Essential hypertension Go home and take medications, then recheck BP at home and let me know.  If continues to be elevated will need to change medications  Keep chronic follow up - BMP8+EGFR   Labs pending Continue current medications  Keep follow up with specialists  Health Maintenance reviewed Diet and exercise encouraged  Return if symptoms worsen or fail to  improve.    Bari Learn, FNP

## 2024-02-04 NOTE — Telephone Encounter (Signed)
 PAP: Patient assistance application for Farxiga  through [employer] ([employer] has been warden/ranger to [place].

## 2024-02-05 ENCOUNTER — Ambulatory Visit: Payer: Self-pay | Admitting: Family

## 2024-02-05 ENCOUNTER — Encounter (HOSPITAL_COMMUNITY)

## 2024-02-05 LAB — BMP8+EGFR
BUN/Creatinine Ratio: 19 (ref 12–28)
BUN: 22 mg/dL (ref 8–27)
CO2: 22 mmol/L (ref 20–29)
Calcium: 10.2 mg/dL (ref 8.7–10.3)
Chloride: 110 mmol/L — ABNORMAL HIGH (ref 96–106)
Creatinine, Ser: 1.16 mg/dL — ABNORMAL HIGH (ref 0.57–1.00)
Glucose: 143 mg/dL — ABNORMAL HIGH (ref 70–99)
Potassium: 4.1 mmol/L (ref 3.5–5.2)
Sodium: 147 mmol/L — ABNORMAL HIGH (ref 134–144)
eGFR: 48 mL/min/1.73 — ABNORMAL LOW (ref >59)

## 2024-02-06 DIAGNOSIS — Z780 Asymptomatic menopausal state: Secondary | ICD-10-CM | POA: Diagnosis not present

## 2024-02-06 DIAGNOSIS — M810 Age-related osteoporosis without current pathological fracture: Secondary | ICD-10-CM | POA: Diagnosis not present

## 2024-02-06 NOTE — Telephone Encounter (Signed)
 The patient called back in stating [name] is already helping her with Entresto  and Farxiga  but she states she forgot to mention ELiquis  as that too is very expensive for her even with her insurance. Please assist patient further if she can get assistance with this medication as well.

## 2024-02-07 ENCOUNTER — Telehealth (HOSPITAL_COMMUNITY): Payer: Self-pay

## 2024-02-07 ENCOUNTER — Encounter (HOSPITAL_COMMUNITY)

## 2024-02-07 ENCOUNTER — Ambulatory Visit: Payer: Self-pay | Admitting: Family

## 2024-02-07 MED ORDER — ALENDRONATE SODIUM 70 MG PO TABS: 70.0000 mg | ORAL_TABLET | ORAL | 3 refills | Status: AC

## 2024-02-07 NOTE — Telephone Encounter (Signed)
 Called patient about missing appointments. She did not answer so left a message for her to call back.

## 2024-02-12 ENCOUNTER — Encounter (HOSPITAL_COMMUNITY)

## 2024-02-12 ENCOUNTER — Encounter (HOSPITAL_COMMUNITY)
Admission: RE | Admit: 2024-02-12 | Discharge: 2024-02-12 | Disposition: A | Discharge status: Home / Self Care | Source: Ambulatory Visit | Attending: Cardiology | Admitting: Cardiology

## 2024-02-12 DIAGNOSIS — Z955 Presence of coronary angioplasty implant and graft: Secondary | ICD-10-CM | POA: Diagnosis not present

## 2024-02-12 NOTE — Progress Notes (Signed)
 Daily Session Note  Patient Details  Name: LUTICIA TADROS MRN: [id number] Date of Birth: [birth year] Referring Provider:   Flowsheet Row CARDIAC REHAB PHASE II ORIENTATION from 10/31/2023 in [facility]  Referring Provider Lavona Agent MD    Encounter Date: 02/12/2024  Check In:  Session Check In - 02/12/24 1448       Check-In   Supervising physician immediately available to respond to emergencies See telemetry face sheet for immediately available MD    Location [facility]    Staff Present Laymon Rattler, BSN, RN, WTA-C;Minna Dumire Zina, RN    Virtual Visit No    Medication changes reported     No    Fall or balance concerns reported    No    Warm-up and Cool-down Performed on first and last piece of equipment    Resistance Training Performed Yes    VAD Patient? No    PAD/SET Patient? No      Pain Assessment   Currently in Pain? No/denies          Capillary Blood Glucose: No results found for this or any previous visit (from the past 24 hours).    Social History   Tobacco Use  Smoking Status Never  Smokeless Tobacco Never    Goals Met:  Independence with exercise equipment Exercise tolerated well No report of concerns or symptoms today Strength training completed today  Goals Unmet:  Not Applicable  Comments: Pt able to follow exercise prescription today without complaint.  Will continue to monitor for progression.

## 2024-02-13 ENCOUNTER — Encounter (HOSPITAL_COMMUNITY): Payer: Self-pay

## 2024-02-13 DIAGNOSIS — Z955 Presence of coronary angioplasty implant and graft: Secondary | ICD-10-CM

## 2024-02-13 NOTE — Progress Notes (Signed)
 Cardiac Individual Treatment Plan  Patient Details  Name: [name] MRN: [id number] Date of Birth: 02-11-1946 Referring Provider:   Flowsheet Row CARDIAC REHAB PHASE II ORIENTATION from 10/31/2023 in [facility] CARDIAC REHABILITATION  Referring Provider Lavona Agent MD    Initial Encounter Date:  Flowsheet Row CARDIAC REHAB PHASE II ORIENTATION from 10/31/2023 in [place] [facility]ARDIAC [facility]ILITATION  Date 10/31/23    Visit Diagnosis: Status post coronary artery stent placement  Patient's Home Medications on Admission:  Current Outpatient Medications:    acetaminophen  (TYLENOL ) 500 MG tablet, Take 1,000 mg by mouth every 6 (six) hours as needed for moderate pain or headache., Disp: , Rfl:    albuterol  (PROVENTIL ) (2.5 MG/3ML) 0.083% nebulizer solution, Take 3 mLs (2.5 mg total) by nebulization every 6 (six) hours as needed for wheezing or shortness of breath., Disp: 150 mL, Rfl: 1   albuterol  (VENTOLIN  HFA) 108 (90 Base) MCG/ACT inhaler, Inhale 2 puffs into the lungs every 6 (six) hours as needed for wheezing or shortness of breath., Disp: 18 g, Rfl: 0   alendronate  (FOSAMAX ) 70 MG tablet, Take 1 tablet (70 mg total) by mouth every 7 (seven) days. Take with a full glass of water  on an empty stomach., Disp: 12 tablet, Rfl: 3   amLODipine  (NORVASC ) 10 MG tablet, Take 1 tablet (10 mg total) by mouth daily., Disp: 90 tablet, Rfl: 2   apixaban  (ELIQUIS ) 5 MG TABS tablet, Take 1 tablet (5 mg total) by mouth 2 (two) times daily., Disp: 60 tablet, Rfl: 5   aspirin  81 MG chewable tablet, Chew 1 tablet (81 mg total) by mouth daily., Disp: , Rfl:    bisoprolol  (ZEBETA ) 5 MG tablet, Take 1 tablet (5 mg total) by mouth daily., Disp: 90 tablet, Rfl: 3   clopidogrel  (PLAVIX ) 75 MG tablet, Take 1 tablet (75 mg total) by mouth daily with breakfast., Disp: 30 tablet, Rfl: 5   dapagliflozin  propanediol (FARXIGA ) 10 MG TABS tablet, Take 1 tablet (10 mg total) by mouth daily., Disp: 90 tablet, Rfl: 3    erythromycin  ophthalmic ointment, Place 1 Application into the right eye at bedtime., Disp: 3.5 g, Rfl: 0   furosemide  (LASIX ) 20 MG tablet, Take 1 tablet (20 mg total) by mouth daily., Disp: 90 tablet, Rfl: 1   glucose blood (ONETOUCH VERIO) test strip, Use as instructed, Disp: 100 each, Rfl: 12   isosorbide  mononitrate (IMDUR ) 30 MG 24 hr tablet, Take 1 tablet (30 mg total) by mouth daily., Disp: 90 tablet, Rfl: 3   nitroGLYCERIN  (NITROSTAT ) 0.4 MG SL tablet, Place 1 tablet (0.4 mg total) under the tongue every 5 (five) minutes as needed for chest pain., Disp: 25 tablet, Rfl: 2   omeprazole  (PRILOSEC) 20 MG capsule, Take 1 capsule (20 mg total) by mouth daily., Disp: 90 capsule, Rfl: 1   ONETOUCH DELICA LANCETS FINE MISC, Use to check BG once daily, Disp: 100 each, Rfl: 2   rosuvastatin  (CRESTOR ) 40 MG tablet, Take 1 tablet (40 mg total) by mouth daily., Disp: 30 tablet, Rfl: 11   sacubitril -valsartan  (ENTRESTO ) 97-103 MG, Take 1 tablet by mouth 2 (two) times daily., Disp: 60 tablet, Rfl: 11   spironolactone  (ALDACTONE ) 25 MG tablet, Take 1 tablet (25 mg total) by mouth daily., Disp: 30 tablet, Rfl: 6   triamcinolone  ointment (KENALOG ) 0.5 %, Apply 1 Application topically 2 (two) times daily., Disp: 60 g, Rfl: 0  Past Medical History: Past Medical History:  Diagnosis Date   Asthma    Chronic  systolic heart failure (HCC)    Echo (08/26/13):  Mild LVH. EF 25% to 30%. Diffuse HK. Aortic valve: There was trivial regurgitation. Mitral valve: There was mild regurgitation. Left atrium: The atrium was mildly dilated.   GERD (gastroesophageal reflux disease)    Headache(784.0)    History of kidney stones    History of nephrolithiasis    Hypertension    NICM (nonischemic cardiomyopathy) (HCC)    a. LHC (08/2013):  no CAD   Sleep apnea    does not use machine   Stroke (HCC)    Type II or unspecified type diabetes mellitus without mention of complication, uncontrolled    borderline    Tobacco  Use: Social History   Tobacco Use  Smoking Status Never  Smokeless Tobacco Never    Labs: Review Flowsheet  More data exists      Latest Ref Rng & Units 05/05/2021 08/08/2021 10/25/2021 11/16/2022 12/31/2023  Labs for ITP Cardiac and Pulmonary Rehab  Cholestrol 100 - 199 mg/dL 791  - - 786  833   LDL (calc) 0 - 99 mg/dL 874  - - 861  95   HDL-C >39 mg/dL 40  - - 44  42   Trlycerides 0 - 149 mg/dL 759  - - 824  831   Hemoglobin A1c 4.8 - 5.6 % 7.1  6.9  7.1  7.5  6.8      Exercise Target Goals: Exercise Program Goal: Individual exercise prescription set using results from initial 6 min walk test and THRR while considering  patient's activity barriers and safety.   Exercise Prescription Goal: Initial exercise prescription builds to 30-45 minutes a day of aerobic activity, 2-3 days per week.  Home exercise guidelines will be given to patient during program as part of exercise prescription that the participant will acknowledge.   Education: Aerobic Exercise: - Group verbal and visual presentation on the components of exercise prescription. Introduces F.I.T.T principle from ACSM for exercise prescriptions.  Reviews F.I.T.T. principles of aerobic exercise including progression. Written material provided at class time.   Education: Resistance Exercise: - Group verbal and visual presentation on the components of exercise prescription. Introduces F.I.T.T principle from ACSM for exercise prescriptions  Reviews F.I.T.T. principles of resistance exercise including progression. Written material provided at class time. Flowsheet Row CARDIAC REHAB PHASE II EXERCISE from 01/31/2024 in [place] [facility]ARDI[facility]  Date 12/13/23  Educator [facility]  Instruction Review Code 1- Verbalizes Understanding     Education: Exercise & Equipment Safety: - Individual verbal instruction and demonstration of equipment use and safety with use of the equipment.   Education: Exercise Physiology & General  Exercise Guidelines: - Group verbal and written instruction with models to review the exercise physiology of the cardiovascular system and associated critical values. Provides general exercise guidelines with specific guidelines to those with heart or lung disease. Written material provided at class time. Flowsheet Row CARDIAC REHAB PHASE II EXERCISE from 01/31/2024 in [place] [facility]ARD[facility]  Date 11/22/23  Educator [facility]  Instruction Review Code 2- Demonstrated Understanding    Education: Flexibility, Balance, Mind/Body Relaxation: - Group verbal and visual presentation with interactive activity on the components of exercise prescription. Introduces F.I.T.T principle from ACSM for exercise prescriptions. Reviews F.I.T.T. principles of flexibility and balance exercise training including progression. Also discusses the mind body connection.  Reviews various relaxation techniques to help reduce and manage stress (i.e. Deep breathing, progressive muscle relaxation, and visualization). Balance handout provided to take home. Written material provided at  class time. Flowsheet Row CARDIAC REHAB PHASE II EXERCISE from 01/31/2024 in [facility] CARDIAC REHABILITATION  Date 12/13/23  Educator [facility]  Instruction Review Code 1- Verbalizes Understanding    Activity Barriers & Risk Stratification:  Activity Barriers & Cardiac Risk Stratification - 10/31/23 1414       Activity Barriers & Cardiac Risk Stratification   Activity Barriers History of Falls;Balance Concerns;Muscular Weakness;Deconditioning    Cardiac Risk Stratification Moderate          6 Minute Walk:  6 Minute Walk     Row Name 10/31/23 1507         6 Minute Walk   Phase Initial     Distance 895 feet     Walk Time 6 minutes     # of Rest Breaks 0     MPH 1.69     METS 1.38     RPE 11     Perceived Dyspnea  3     VO2 Peak 4.84     Symptoms Yes (comment)     Comments SOB, wearing flip flops     Resting HR 60 bpm      Resting BP 122/66     Resting Oxygen Saturation  98 %     Exercise Oxygen Saturation  during 6 min walk 99 %     Max Ex. HR 92 bpm     Max Ex. BP 126/74     2 Minute Post BP 106/70        Oxygen Initial Assessment:   Oxygen Re-Evaluation:   Oxygen Discharge (Final Oxygen Re-Evaluation):   Initial Exercise Prescription:  Initial Exercise Prescription - 10/31/23 1500       Date of Initial Exercise RX and Referring Provider   Date 10/31/23    Referring Provider Lavona Agent MD      Oxygen   Maintain Oxygen Saturation 88% or higher      Treadmill   MPH 1.5    Grade 0.5    Minutes 15    METs 2.25      NuStep   Level 1    SPM 80    Minutes 15    METs 2.2      Prescription Details   Frequency (times per week) 2    Duration Progress to 30 minutes of continuous aerobic without signs/symptoms of physical distress      Intensity   THRR 40-80% of Max Heartrate 93-126    Ratings of Perceived Exertion 11-13    Perceived Dyspnea 0-4      Progression   Progression Continue to progress workloads to maintain intensity without signs/symptoms of physical distress.      Resistance Training   Training Prescription Yes    Weight 3 lb    Reps 10-15          Perform Capillary Blood Glucose checks as needed.  Exercise Prescription Changes:   Exercise Prescription Changes     Row Name 10/31/23 1500 11/08/23 1500 12/04/23 1500 01/03/24 1500 01/22/24 1500     Response to Exercise   Blood Pressure (Admit) 122/60 140/70 120/64 110/60 150/78   Blood Pressure (Exercise) 126/74 122/62 136/82 -- --   Blood Pressure (Exit) 106/70 148/60 110/70 118/68 128/64   Heart Rate (Admit) 60 bpm 55 bpm 75 bpm 91 bpm 66 bpm   Heart Rate (Exercise) 92 bpm 94 bpm 117 bpm 101 bpm 103 bpm   Heart Rate (Exit) 70 bpm 51 bpm 90 bpm 72 bpm 69  bpm   Oxygen Saturation (Admit) 98 % -- -- -- --   Oxygen Saturation (Exercise) 99 % -- -- -- --   Rating of Perceived Exertion (Exercise) 11 12 15 12  12    Perceived Dyspnea (Exercise) 3 -- -- -- --   Symptoms SOB -- -- -- --   Comments walk test results (in flip flops) -- -- -- --   Duration -- Continue with 30 min of aerobic exercise without signs/symptoms of physical distress. Continue with 30 min of aerobic exercise without signs/symptoms of physical distress. Continue with 30 min of aerobic exercise without signs/symptoms of physical distress. Continue with 30 min of aerobic exercise without signs/symptoms of physical distress.   Intensity -- THRR unchanged THRR unchanged THRR unchanged THRR unchanged     Progression   Progression -- Continue to follow PAD protocol Continue to progress workloads to maintain intensity without signs/symptoms of physical distress. Continue to progress workloads to maintain intensity without signs/symptoms of physical distress. Continue to progress workloads to maintain intensity without signs/symptoms of physical distress.     Resistance Training   Training Prescription -- Yes Yes Yes Yes   Weight -- 3 3 3 3    Reps -- 10-15 10-15 10-15 10-15     Treadmill   MPH -- 1 1.2 1.4 1.5   Grade -- 0 0 0 0   Minutes -- 15 15 15 15    METs -- 1.77 1.92 2.07 2.15     NuStep   Level -- 1 2 2 1    SPM -- -- 38 66 55   Minutes -- 15 15 15 15    METs -- 1.4 1.8 1.6 1.6      Exercise Comments:   Exercise Comments     Row Name 11/06/23 1337           Exercise Comments First full day of exercise!  Patient was oriented to gym and equipment including functions, settings, policies, and procedures.  Patient's individual exercise prescription and treatment plan were reviewed.  All starting workloads were established based on the results of the 6 minute walk test done at initial orientation visit.  The plan for exercise progression was also introduced and progression will be customized based on patient's performance and goals.          Exercise Goals and Review:   Exercise Goals     Row Name 10/31/23 1510              Exercise Goals   Increase Physical Activity Yes       Intervention Provide advice, education, support and counseling about physical activity/exercise needs.;Develop an individualized exercise prescription for aerobic and resistive training based on initial evaluation findings, risk stratification, comorbidities and participant's personal goals.       Expected Outcomes Short Term: Attend rehab on a regular basis to increase amount of physical activity.;Long Term: Exercising regularly at least 3-5 days a week.;Long Term: Add in home exercise to make exercise part of routine and to increase amount of physical activity.       Increase Strength and Stamina Yes       Intervention Provide advice, education, support and counseling about physical activity/exercise needs.;Develop an individualized exercise prescription for aerobic and resistive training based on initial evaluation findings, risk stratification, comorbidities and participant's personal goals.       Expected Outcomes Short Term: Increase workloads from initial exercise prescription for resistance, speed, and METs.;Short Term: Perform resistance training exercises routinely during rehab and add  in resistance training at home;Long Term: Improve cardiorespiratory fitness, muscular endurance and strength as measured by increased METs and functional capacity ( )       Able to understand and use rate of perceived exertion (RPE) scale Yes       Intervention Provide education and explanation on how to use RPE scale       Expected Outcomes Short Term: Able to use RPE daily in rehab to express subjective intensity level;Long Term:  Able to use RPE to guide intensity level when exercising independently       Able to understand and use Dyspnea scale Yes       Intervention Provide education and explanation on how to use Dyspnea scale       Expected Outcomes Short Term: Able to use Dyspnea scale daily in rehab to express subjective sense of  shortness of breath during exertion;Long Term: Able to use Dyspnea scale to guide intensity level when exercising independently       Knowledge and understanding of Target Heart Rate Range (THRR) Yes       Intervention Provide education and explanation of THRR including how the numbers were predicted and where they are located for reference       Expected Outcomes Short Term: Able to state/look up THRR;Long Term: Able to use THRR to govern intensity when exercising independently;Short Term: Able to use daily as guideline for intensity in rehab       Able to check pulse independently Yes       Intervention Provide education and demonstration on how to check pulse in carotid and radial arteries.;Review the importance of being able to check your own pulse for safety during independent exercise       Expected Outcomes Short Term: Able to explain why pulse checking is important during independent exercise;Long Term: Able to check pulse independently and accurately       Understanding of Exercise Prescription Yes       Intervention Provide education, explanation, and written materials on patient's individual exercise prescription       Expected Outcomes Short Term: Able to explain program exercise prescription;Long Term: Able to explain home exercise prescription to exercise independently          Exercise Goals Re-Evaluation :  Exercise Goals Re-Evaluation     Row Name 12/11/23 1443 01/08/24 1521           Exercise Goal Re-Evaluation   Exercise Goals Review Increase Physical Activity;Increase Strength and Stamina;Able to understand and use rate of perceived exertion (RPE) scale;Knowledge and understanding of Target Heart Rate Range (THRR);Understanding of Exercise Prescription;Able to check pulse independently Increase Physical Activity;Increase Strength and Stamina;Able to understand and use Dyspnea scale;Able to check pulse independently;Understanding of Exercise Prescription;Knowledge and  understanding of Target Heart Rate Range (THRR);Able to understand and use rate of perceived exertion (RPE) scale      Comments Jelena is doing great in rehab. She does notes she is not excercising at home at all. I asked if there was anywhere she could walk at home and she noted that [place] road that she could walk. Mindel is doing good in rehab. She notes she is still not exercising at home, she is still in the process of moving so she says she is lifting boxes. Mentioned again to her about trying to at least walk everyday for 15-30 minutes.      Expected Outcomes Short: continue to attend rehab. Long: Walking everyday for at least  30 minutes. Short: continue to attend rehab. Long: Walking everyday for at least 30 minutes.         Discharge Exercise Prescription (Final Exercise Prescription Changes):  Exercise Prescription Changes - 01/22/24 1500       Response to Exercise   Blood Pressure (Admit) 150/78    Blood Pressure (Exit) 128/64    Heart Rate (Admit) 66 bpm    Heart Rate (Exercise) 103 bpm    Heart Rate (Exit) 69 bpm    Rating of Perceived Exertion (Exercise) 12    Duration Continue with 30 min of aerobic exercise without signs/symptoms of physical distress.    Intensity THRR unchanged      Progression   Progression Continue to progress workloads to maintain intensity without signs/symptoms of physical distress.      Resistance Training   Training Prescription Yes    Weight 3    Reps 10-15      Treadmill   MPH 1.5    Grade 0    Minutes 15    METs 2.15      NuStep   Level 1    SPM 55    Minutes 15    METs 1.6          Nutrition:  Target Goals: Understanding of nutrition guidelines, daily intake of sodium 1500mg , cholesterol 200mg , calories 30% from fat and 7% or less from saturated fats, daily to have 5 or more servings of fruits and vegetables.  Education: Nutrition 1 -Group instruction provided by verbal, written material, interactive  activities, discussions, models, and posters to present general guidelines for heart healthy nutrition including macronutrients, label reading, and promoting whole foods over processed counterparts. Education serves as pensions consultant of discussion of heart healthy eating for all. Written material provided at class time.    Education: Nutrition 2 -Group instruction provided by verbal, written material, interactive activities, discussions, models, and posters to present general guidelines for heart healthy nutrition including sodium, cholesterol, and saturated fat. Providing guidance of habit forming to improve blood pressure, cholesterol, and body weight. Written material provided at class time.     Biometrics:  Pre Biometrics - 10/31/23 1510       Pre Biometrics   Height 5' 0.5 (1.537 m)    Weight 158 lb (71.7 kg)    Waist Circumference 35.25 inches    Hip Circumference 41.25 inches    Waist to Hip Ratio 0.85 %    BMI (Calculated) 30.34    Grip Strength 9.1 kg    Single Leg Stand 4 seconds           Nutrition Therapy Plan and Nutrition Goals:  Nutrition Therapy & Goals - 10/31/23 1515       Intervention Plan   Intervention Prescribe, educate and counsel regarding individualized specific dietary modifications aiming towards targeted core components such as weight, hypertension, lipid management, diabetes, heart failure and other comorbidities.;Nutrition handout(s) given to patient.    Expected Outcomes Short Term Goal: Understand basic principles of dietary content, such as calories, fat, sodium, cholesterol and nutrients.;Long Term Goal: Adherence to prescribed nutrition plan.          Nutrition Assessments:  MEDIFICTS Score Key: >=70 Need to make dietary changes  40-70 Heart Healthy Diet <= 40 Therapeutic Level Cholesterol Diet  Flowsheet Row CARDIAC REHAB PHASE II ORIENTATION from 10/31/2023 in [facility] CARDIAC REHABILITATION  Picture Your Plate Total Score on  Admission 60   Picture Your Plate Scores: <59 Unhealthy dietary pattern with  much room for improvement. 41-50 Dietary pattern unlikely to meet recommendations for good health and room for improvement. 51-60 More healthful dietary pattern, with some room for improvement.  >60 Healthy dietary pattern, although there may be some specific behaviors that could be improved.    Nutrition Goals Re-Evaluation:  Nutrition Goals Re-Evaluation     Row Name 12/11/23 1435 01/08/24 1514           Goals   Comment Obelia notes she is eating pretty good. She says she is watching her salt and sugar intake due to her high b/p and diabeties. Autum still is trying to watch her salt and sugar intake. She notes salt has been harder to cut back than sugar. Daritza likes to add salt to her foods, spoke with her about trying to stop adding salt.      Expected Outcome Short: continue to monitor salts and sugars in diet. Long: maintain healthy diet and eat well balanced meals. Short: To stop adding salt to foods. Long: maintain healthy diet and eat well balanced meals.         Nutrition Goals Discharge (Final Nutrition Goals Re-Evaluation):  Nutrition Goals Re-Evaluation - 01/08/24 1514       Goals   Comment Azariah still is trying to watch her salt and sugar intake. She notes salt has been harder to cut back than sugar. Donnamarie likes to add salt to her foods, spoke with her about trying to stop adding salt.    Expected Outcome Short: To stop adding salt to foods. Long: maintain healthy diet and eat well balanced meals.          Psychosocial: Target Goals: Acknowledge presence or absence of significant depression and/or stress, maximize coping skills, provide positive support system. Participant is able to verbalize types and ability to use techniques and skills needed for reducing stress and depression.   Education: Stress, Anxiety, and Depression - Group verbal and visual presentation to define  topics covered.  Reviews how body is impacted by stress, anxiety, and depression.  Also discusses healthy ways to reduce stress and to treat/manage anxiety and depression. Written material provided at class time.   Education: Sleep Hygiene -Provides group verbal and written instruction about how sleep can affect your health.  Define sleep hygiene, discuss sleep cycles and impact of sleep habits. Review good sleep hygiene tips.   Initial Review & Psychosocial Screening:  Initial Psych Review & Screening - 10/31/23 1418       Initial Review   Current issues with History of Depression;Current Stress Concerns    Source of Stress Concerns Unable to participate in former interests or hobbies;Unable to perform yard/household activities;Financial    Comments In process of moving.  Was living with daughter in [place], moving to [place] to be closer to work      [employer]   Good Support System? Yes   Daughter, friends from church     Barriers   Psychosocial barriers to participate in program Psychosocial barriers identified (see note);The patient should benefit from training in stress management and relaxation.      Screening Interventions   Interventions Encouraged to exercise;To provide support and resources with identified psychosocial needs;Provide feedback about the scores to participant    Expected Outcomes Short Term goal: Utilizing psychosocial counselor, staff and physician to assist with identification of specific Stressors or current issues interfering with healing process. Setting desired goal for each stressor or current issue identified.;Long Term Goal: Stressors or current issues are  controlled or eliminated.;Short Term goal: Identification and review with participant of any Quality of Life or Depression concerns found by scoring the questionnaire.;Long Term goal: The participant improves quality of Life and PHQ9 Scores as seen by post scores and/or verbalization of changes           Quality of Life Scores:   Quality of Life - 10/31/23 1514       Quality of Life   Select Quality of Life      Quality of Life Scores   Health/Function Pre 22.17 %    Socioeconomic Pre 19 %    Psych/Spiritual Pre 22.71 %    Family Pre 25.8 %    GLOBAL Pre 22.07 %         Scores of 19 and below usually indicate a poorer quality of life in these areas.  A difference of  2-3 points is a clinically meaningful difference.  A difference of 2-3 points in the total score of the Quality of Life Index has been associated with significant improvement in overall quality of life, self-image, physical symptoms, and general health in studies assessing change in quality of life.  PHQ-9: Review Flowsheet  More data exists      01/08/2024 01/03/2024 12/31/2023 11/27/2023 10/31/2023  Depression screen PHQ 2/9  Decreased Interest 0 0 0 1 1  Down, Depressed, Hopeless 0 0 0 1 1  PHQ - 2 Score 0 0 0 2 2  Altered sleeping 0 0 2 1 0  Tired, decreased energy 0 0 0 2 3  Change in appetite 0 0 0 0 0  Feeling bad or failure about yourself  0 0 2 1 1   Trouble concentrating 0 0 2 1 1   Moving slowly or fidgety/restless 0 0 0 0 0  Suicidal thoughts 0 0 0 0 0  PHQ-9 Score 0 0 6 7 7   Difficult doing work/chores Not difficult at all Not difficult at all Somewhat difficult Somewhat difficult Somewhat difficult   Interpretation of Total Score  Total Score Depression Severity:  1-4 = Minimal depression, 5-9 = Mild depression, 10-14 = Moderate depression, 15-19 = Moderately severe depression, 20-27 = Severe depression   Psychosocial Evaluation and Intervention:  Psychosocial Evaluation - 10/31/23 1511       Psychosocial Evaluation & Interventions   Comments Kyle is coming into cardiac rehab after having a stent placed in June.  She is also in the middle of moving from [place] to [place] to be closer to work.  She was living with her daughter (her biggest supporter) but wanted to move out to be  independent and close to work since she goes in 4 days a week. 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  She is eager to build her strength and stamina back up. She wants to get into a routine for herself again.  She usually sleeps well.  She does not precieve any issues with getting to rehab reguarly.  She does have a copay and will let us  know if it gets to be too much. She has a history of meds but is no longer needing them.  She is also off of her type 2 DM meds which she is very proud of herself.    Expected Outcomes Short: Attend rehab to build up stamina. Long: Continue to improve strength    Continue Psychosocial Services  Follow up required by staff          Psychosocial Re-Evaluation:  Psychosocial Re-Evaluation     Row Name 12/11/23 1432 01/08/24 1507           Psychosocial Re-Evaluation   Current issues with Current Sleep Concerns;Current Stress Concerns Current Stress Concerns;Current Sleep Concerns      Comments Lashawnta notes she is in the middle of moving, she is moving out of her daughters house into a house to herself, so she is having some stress from that. She also notes she wakes up 2-3 times a night and she thinks it is her b/p meds causing this. Melodi is still moving currently, but notes she is mostly finished. Her new home is smaller than her old home so she is adjusting to that, but her new home is closer to her work so that is nice for her. She notes her sleep is still rough waking up around 2 am most mornings and can't go back to sleep. Her doctor added another b/p med for her but she doesn't think it's helping.      Expected Outcomes Short: speak with her PCP about her b/p meds and her sleeping concerns. Long: continue to have healthy stress outlets. Short: speak with her PCP about her sleeping concerns. Long: continue to have healthy stress outlets.      Interventions Relaxation education;Encouraged to attend Cardiac Rehabilitation for the  exercise;Stress management education Encouraged to attend Cardiac Rehabilitation for the exercise      Continue Psychosocial Services  Follow up required by staff Follow up required by staff         Psychosocial Discharge (Final Psychosocial Re-Evaluation):  Psychosocial Re-Evaluation - 01/08/24 1507       Psychosocial Re-Evaluation   Current issues with Current Stress Concerns;Current Sleep Concerns    Comments Senie is still moving currently, but notes she is mostly finished. Her new home is smaller than her old home so she is adjusting to that, but her new home is closer to her work so that is nice for her. She notes her sleep is still rough waking up around 2 am most mornings and can't go back to sleep. Her doctor added another b/p med for her but she doesn't think it's helping.    Expected Outcomes Short: speak with her PCP about her sleeping concerns. Long: continue to have healthy stress outlets.    Interventions Encouraged to attend Cardiac Rehabilitation for the exercise    Continue Psychosocial Services  Follow up required by staff          Vocational Rehabilitation: Provide vocational rehab assistance to qualifying candidates.   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 Participant will state understanding/return demonstration of topics presented as noted by education test scores.  Learning Barriers/Preferences:  Learning Barriers/Preferences - 10/31/23 1416       Learning Barriers/Preferences   Learning Barriers None    Learning Preferences None          General Cardiac Education  Topics:  AED/CPR: - Group verbal and written instruction with the use of models to demonstrate the basic use of the AED with the basic ABC's of resuscitation.   Test and Procedures: -  Group verbal and visual presentation and models provide information about basic cardiac anatomy and function. Reviews the testing methods done to diagnose heart disease and the outcomes of the test results. Describes the treatment choices: Medical Management, Angioplasty, or Coronary Bypass Surgery for treating various heart conditions including Myocardial Infarction, Angina, Valve Disease, and Cardiac Arrhythmias. Written material provided at class time.   Medication Safety: - Group verbal and visual instruction to review commonly prescribed medications for heart and lung disease. Reviews the medication, class of the drug, and side effects. Includes the steps to properly store meds and maintain the prescription regimen. Written material provided at class time.   Intimacy: - Group verbal instruction through game format to discuss how heart and lung disease can affect sexual intimacy. Written material provided at class time.   Know Your Numbers and Heart Failure: - Group verbal and visual instruction to discuss disease risk factors for cardiac and pulmonary disease and treatment options.  Reviews associated critical values for Overweight/Obesity, Hypertension, Cholesterol, and Diabetes.  Discusses basics of heart failure: signs/symptoms and treatments.  Introduces Heart Failure Zone chart for action plan for heart failure. Written material provided at class time.   Infection Prevention: - Provides verbal and written material to individual with discussion of infection control including proper hand washing and proper equipment cleaning during exercise session.   Falls Prevention: - Provides verbal and written material to individual with discussion of falls prevention and safety.   Other: -Provides group and  verbal instruction on various topics (see comments)   Knowledge Questionnaire Score:  Knowledge Questionnaire Score - 10/31/23 1515       Knowledge Questionnaire Score   Pre Score 22/26          Core Components/Risk Factors/Patient Goals at Admission:  Personal Goals and Risk Factors at Admission - 10/31/23 1515       Core Components/Risk Factors/Patient Goals on Admission    Weight Management Yes;Weight Loss;Obesity    Intervention Weight Management: Develop a combined nutrition and exercise program designed to reach desired caloric intake, while maintaining appropriate intake of nutrient and fiber, sodium and fats, and appropriate energy expenditure required for the weight goal.;Weight Management: Provide education and appropriate resources to help participant work on and attain dietary goals.;Weight Management/Obesity: Establish reasonable short term and long term weight goals.;Obesity: Provide education and appropriate resources to help participant work on and attain dietary goals.    Admit Weight 158 lb (71.7 kg)    Goal Weight: Short Term 155 lb (70.3 kg)    Goal Weight: Long Term 150 lb (68 kg)    Expected Outcomes Short Term: Continue to assess and modify interventions until short term weight is achieved;Long Term: Adherence to nutrition and physical activity/exercise program aimed toward attainment of established weight goal;Weight Loss: Understanding of general recommendations for a balanced deficit meal plan, which promotes 1-2 lb weight loss per week and includes a negative energy balance of [phone] kcal/d;Understanding recommendations for meals to include 15-35% energy as protein, 25-35% energy from fat, 35-60% energy from carbohydrates, less than 200mg  of dietary cholesterol, 20-35 gm of total fiber daily;Understanding of distribution of calorie intake throughout the day with the consumption of 4-5 meals/snacks    Improve shortness of breath with ADL's Yes    Intervention  Provide education, individualized exercise plan and daily activity instruction to help decrease symptoms of SOB with activities of daily living.    Expected Outcomes Short Term: Improve cardiorespiratory fitness to achieve a reduction of symptoms when  performing ADLs;Long Term: Be able to perform more ADLs without symptoms or delay the onset of symptoms    Diabetes Yes   no longer on meds or checking sugars   Intervention Provide education about signs/symptoms and action to take for hypo/hyperglycemia.;Provide education about proper nutrition, including hydration, and aerobic/resistive exercise prescription along with prescribed medications to achieve blood glucose in normal ranges: Fasting glucose 65-99 mg/dL    Expected Outcomes Short Term: Participant verbalizes understanding of the signs/symptoms and immediate care of hyper/hypoglycemia, proper foot care and importance of medication, aerobic/resistive exercise and nutrition plan for blood glucose control.    Heart Failure Yes    Intervention Provide a combined exercise and nutrition program that is supplemented with education, support and counseling about heart failure. Directed toward relieving symptoms such as shortness of breath, decreased exercise tolerance, and extremity edema.    Expected Outcomes Improve functional capacity of life;Short term: Attendance in program 2-3 days a week with increased exercise capacity. Reported lower sodium intake. Reported increased fruit and vegetable intake. Reports medication compliance.;Short term: Daily weights obtained and reported for increase. Utilizing diuretic protocols set by physician.;Long term: Adoption of self-care skills and reduction of barriers for early signs and symptoms recognition and intervention leading to self-care maintenance.    Hypertension Yes    Intervention Provide education on lifestyle modifcations including regular physical activity/exercise, weight management, moderate sodium  restriction and increased consumption of fresh fruit, vegetables, and low fat dairy, alcohol  moderation, and smoking cessation.;Monitor prescription use compliance.    Expected Outcomes Short Term: Continued assessment and intervention until BP is < 140/26mm HG in hypertensive participants. < 130/10mm HG in hypertensive participants with diabetes, heart failure or chronic kidney disease.;Long Term: Maintenance of blood pressure at goal levels.    Lipids Yes    Intervention Provide education and support for participant on nutrition & aerobic/resistive exercise along with prescribed medications to achieve LDL 70mg , HDL >40mg .    Expected Outcomes Short Term: Participant states understanding of desired cholesterol values and is compliant with medications prescribed. Participant is following exercise prescription and nutrition guidelines.;Long Term: Cholesterol controlled with medications as prescribed, with individualized exercise RX and with personalized nutrition plan. Value goals: LDL < 70mg , HDL > 40 mg.          Education:Diabetes - Individual verbal and written instruction to review signs/symptoms of diabetes, desired ranges of glucose level fasting, after meals and with exercise. Acknowledge that pre and post exercise glucose checks will be done for 3 sessions at entry of program.   Core Components/Risk Factors/Patient Goals Review:   Goals and Risk Factor Review     Row Name 12/11/23 1438 01/08/24 1517           Core Components/Risk Factors/Patient Goals Review   Personal Goals Review Hypertension;Diabetes Improve shortness of breath with ADL's;Develop more efficient breathing techniques such as purse lipped breathing and diaphragmatic breathing and practicing self-pacing with activity.;Hypertension;Diabetes      Review Natalee notes she is on 3 different b/p meds. Also notes she has type 2 diabeties and she is on no medication for,she also does not check her sugar at home. I mentioned  to her to speak with her PCP about checking her A1C. Janeka's PCP added Fargixa to her meds for her diabetes, she is still not checking her blood sugar at home, but plans to start, mentioned to her it is important to know her blood glucose levels. She also notes she is not checking her b/p at home  much either, mentioned to her that was alos just as important especially since she is on 3 different b/p meds.      Expected Outcomes Short: communicate with PCP about checking her A1C. Long: continue to follow up with her PCP and follow medication prescriptions. Short: start checking her b/p and blood glucose daily at home. Long: continue to follow up with her PCP and follow medication prescriptions.         Core Components/Risk Factors/Patient Goals at Discharge (Final Review):   Goals and Risk Factor Review - 01/08/24 1517       Core Components/Risk Factors/Patient Goals Review   Personal Goals Review Improve shortness of breath with ADL's;Develop more efficient breathing techniques such as purse lipped breathing and diaphragmatic breathing and practicing self-pacing with activity.;Hypertension;Diabetes    Review Kaydense's PCP added Fargixa to her meds for her diabetes, she is still not checking her blood sugar at home, but plans to start, mentioned to her it is important to know her blood glucose levels. She also notes she is not checking her b/p at home much either, mentioned to her that was alos just as important especially since she is on 3 different b/p meds.    Expected Outcomes Short: start checking her b/p and blood glucose daily at home. Long: continue to follow up with her PCP and follow medication prescriptions.          ITP Comments:  ITP Comments     Row Name 10/31/23 1503 11/06/23 1337 11/21/23 1248 12/19/23 1519 01/16/24 1817   ITP Comments Patient attend orientation today.  Patient is attending Cardiac Rehabilitation Program.  Documentation for diagnosis can be found in 10/05/23.   Reviewed medical chart, RPE/RPD, gym safety, and program guidelines.  Patient was fitted to equipment they will be using during rehab.  Patient is scheduled to start exercise on Tuesday 11/06/23 at 1330.   Initial ITP created and sent for review and signature by Dr. Dorn Ross, Medical Director for Cardiac Rehabilitation Program. First full day of exercise!  Patient was oriented to gym and equipment including functions, settings, policies, and procedures.  Patient's individual exercise prescription and treatment plan were reviewed.  All starting workloads were established based on the results of the 6 minute walk test done at initial orientation visit.  The plan for exercise progression was also introduced and progression will be customized based on patient's performance and goals. 30 day review completed. ITP sent to Dr. Dorn Ross, Medical Director of Cardiac Rehab. Continue with ITP unless changes are made by physician.  Newer to program. 30 day review completed. ITP sent to Dr. Dorn Ross, Medical Director of Cardiac Rehab. Continue with ITP unless changes are made by physician. 30 day review completed. ITP sent to Dr. Dorn Ross, Medical Director of Cardiac Rehab. Continue with ITP unless changes are made by physician.    Row Name 02/13/24 0925           ITP Comments 30 day review completed. ITP sent to Dr. Dorn Ross, Medical Director of Cardiac Rehab. Continue with ITP unless changes are made by physician.          Comments: 30 day review

## 2024-02-14 ENCOUNTER — Encounter (HOSPITAL_BASED_OUTPATIENT_CLINIC_OR_DEPARTMENT_OTHER): Payer: Self-pay | Admitting: Pulmonary Disease

## 2024-02-14 ENCOUNTER — Ambulatory Visit (HOSPITAL_BASED_OUTPATIENT_CLINIC_OR_DEPARTMENT_OTHER): Admitting: Pulmonary Disease

## 2024-02-14 ENCOUNTER — Encounter (HOSPITAL_COMMUNITY)

## 2024-02-14 ENCOUNTER — Encounter (HOSPITAL_COMMUNITY)
Admission: RE | Admit: 2024-02-14 | Discharge: 2024-02-14 | Disposition: A | Discharge status: Home / Self Care | Source: Ambulatory Visit | Attending: Cardiology | Admitting: Cardiology

## 2024-02-14 DIAGNOSIS — Z955 Presence of coronary angioplasty implant and graft: Secondary | ICD-10-CM

## 2024-02-14 NOTE — Progress Notes (Signed)
 Incomplete Session Note  Patient Details  Name: [name] MRN: [id number] Date of Birth: 04-02-1946 Referring Provider:   Flowsheet Row CARDIAC REHAB PHASE II ORIENTATION from 10/31/2023 in [facility] CARDIAC REHABILITATION  Referring Provider Lavona Agent MD    Kyra KANDICE Husband did not complete her rehab session.  Patient blood pressure was 190/80 in left arm and 180/80 in right arm. She said she had a very stressful morning and did not take her blood pressure medication. She complained of a headache but otherwise asymptomatic. Instructed her to go home and take her blood pressure medication and if headache did not improve to call her PCP or go to urgent care or ED. She verbalized understanding.

## 2024-02-19 ENCOUNTER — Encounter (HOSPITAL_COMMUNITY)

## 2024-02-19 ENCOUNTER — Telehealth (HOSPITAL_COMMUNITY): Payer: Self-pay

## 2024-02-21 ENCOUNTER — Encounter (HOSPITAL_COMMUNITY)
Admission: RE | Admit: 2024-02-21 | Discharge: 2024-02-21 | Disposition: A | Discharge status: Home / Self Care | Source: Ambulatory Visit | Attending: Cardiology | Admitting: Cardiology

## 2024-02-21 ENCOUNTER — Encounter (HOSPITAL_COMMUNITY)

## 2024-02-21 DIAGNOSIS — Z955 Presence of coronary angioplasty implant and graft: Secondary | ICD-10-CM | POA: Diagnosis not present

## 2024-02-21 NOTE — Progress Notes (Signed)
 Daily Session Note  Patient Details  Name: [name] MRN: [id number] Date of Birth: 1945/10/23 Referring Provider:   Flowsheet Row CARDIAC REHAB PHASE II ORIENTATION from 10/31/2023 in [facility] CARDIAC REHABILITATION  Referring Provider Lavona Agent MD    Encounter Date: 02/21/2024  Check In:  Session Check In - 02/21/24 1438       Check-In   Supervising physician immediately available to respond to emergencies See telemetry face sheet for immediately available MD    Location [facility]    Staff Present Rolland Sake BSN, RN;Kyerra Vargo Vicci, RN, BSN;Heather Con, BS, Exercise Physiologist    Virtual Visit No    Medication changes reported     No    Fall or balance concerns reported    No    Warm-up and Cool-down Performed on first and last piece of equipment    Resistance Training Performed Yes    VAD Patient? No    PAD/SET Patient? No      Pain Assessment   Currently in Pain? No/denies    Pain Score 0-No pain    Multiple Pain Sites No          Capillary Blood Glucose: No results found for this or any previous visit (from the past 24 hours).    Social History   Tobacco Use  Smoking Status Never  Smokeless Tobacco Never    Goals Met:  Independence with exercise equipment Exercise tolerated well No report of concerns or symptoms today Strength training completed today  Goals Unmet:  Not Applicable  Comments: Pt able to follow exercise prescription today without complaint.  Will continue to monitor for progression.

## 2024-02-22 ENCOUNTER — Other Ambulatory Visit: Payer: Self-pay

## 2024-02-26 ENCOUNTER — Encounter (HOSPITAL_COMMUNITY)
Admission: RE | Admit: 2024-02-26 | Discharge: 2024-02-26 | Disposition: A | Discharge status: Home / Self Care | Source: Ambulatory Visit | Attending: Cardiology | Admitting: Cardiology

## 2024-02-26 ENCOUNTER — Encounter (HOSPITAL_COMMUNITY)

## 2024-02-26 DIAGNOSIS — Z955 Presence of coronary angioplasty implant and graft: Secondary | ICD-10-CM

## 2024-02-26 NOTE — Progress Notes (Signed)
 Daily Session Note  Patient Details  Name: NATILEE GAUER MRN: [id number] Date of Birth: 09/12/1945 Referring Provider:   Flowsheet Row CARDIAC REHAB PHASE II ORIENTATION from 10/31/2023 in [facility] CARDIAC REHABILITATION  Referring Provider Lavona Agent MD    Encounter Date: 02/26/2024  Check In:  Session Check In - 02/26/24 1450       Check-In   Supervising physician immediately available to respond to emergencies See telemetry face sheet for immediately available MD    Location [facility]    Staff Present Powell Benders, BS, Exercise Physiologist;Brittany Jackquline, BSN, RN, WTA-C;Zayvien Canning Zina, RN    Virtual Visit No    Medication changes reported     No    Fall or balance concerns reported    No    Warm-up and Cool-down Performed on first and last piece of equipment    Resistance Training Performed Yes    VAD Patient? No    PAD/SET Patient? No      Pain Assessment   Currently in Pain? No/denies          Capillary Blood Glucose: No results found for this or any previous visit (from the past 24 hours).    Social History   Tobacco Use  Smoking Status Never  Smokeless Tobacco Never    Goals Met:  Independence with exercise equipment Exercise tolerated well No report of concerns or symptoms today Strength training completed today  Goals Unmet:  Not Applicable  Comments: Pt able to follow exercise prescription today without complaint.  Will continue to monitor for progression.

## 2024-02-28 ENCOUNTER — Encounter (HOSPITAL_COMMUNITY)

## 2024-02-29 ENCOUNTER — Telehealth: Payer: Self-pay | Admitting: Pharmacist

## 2024-02-29 DIAGNOSIS — E1169 Type 2 diabetes mellitus with other specified complication: Secondary | ICD-10-CM

## 2024-02-29 NOTE — Telephone Encounter (Signed)
   Assisted in completion of [employer] PAP for Farxiga .  Escribed refills to [facility].   Not filling statin since 12/20/23 for 30 day Also cannot afford Eliquis  or Entresto  PharmD f/u on 03/12/24   Mliss Tarry Griffin, PharmD, BCACP, CPP Clinical Pharmacist, [facility]

## 2024-03-03 NOTE — Telephone Encounter (Signed)
 PAP: Application for Farxiga  has been submitted to [employer] (AZ&Me), via fax  Please send a new 90 day RX to [facility] for patients new PAP enrollment. Thanks!

## 2024-03-04 ENCOUNTER — Encounter (HOSPITAL_COMMUNITY)

## 2024-03-04 MED ORDER — DAPAGLIFLOZIN PROPANEDIOL 10 MG PO TABS
10.0000 mg | ORAL_TABLET | Freq: Every day | ORAL | 4 refills | Status: DC
Start: 2024-03-04 — End: 2024-03-04

## 2024-03-04 MED ORDER — DAPAGLIFLOZIN PROPANEDIOL 10 MG PO TABS: 10.0000 mg | ORAL_TABLET | Freq: Every day | ORAL | 4 refills | Status: AC

## 2024-03-04 NOTE — Addendum Note (Signed)
 Addended by: Daxon Kyne D on: 03/04/2024 11:09 AM   Modules accepted: Orders

## 2024-03-04 NOTE — Addendum Note (Signed)
 Addended by: Kahlia Lagunes D on: 03/04/2024 11:07 AM   Modules accepted: Orders

## 2024-03-09 NOTE — Progress Notes (Unsigned)
 Cardiology Office Note:   Date:  03/12/2024  ID:  [name], DOB Feb 12, 1946, MRN [id number] PCP: Lavell Bari LABOR, FNP  [facility] Providers Cardiologist:  Lynwood Schilling, MD {  History of Present Illness:   [name] is a 78 y.o. female  for ongoing assessment and management of hypertension, chronic dyspnea, nonischemic cardiomyopathy, with probable OSA. In August 2023 she was found to have DVT. This was in the right popliteal and femoral veins. Since I last saw her she has been complaining of some jaw discomfort.  I sent her for CT and ultimately cardiac cath.  She was found to have mid LAD 85% stenosis.  First diagonal 80% stenosis.  First obtuse marginal had 85% stenosis.  She had stent Synergy to the mid LAD.  She had a stent to the first marginal.  This was Synergy.  She presents for follow-up.     At the last visit I added some isosorbide  mononitrate because she was having some chest discomfort.  She says she continues to get this sporadically.  She goes to cardiac rehab and does not necessarily bring in all the activities but it happens sometimes at rest.  She has not like to take any sublingual nitroglycerin  because she has had headaches with that before.  She says her blood pressure is getting followed at cardiac rehab and she has not had any issues with that.  She is not having any PND or orthopnea.  She is not having any palpitations, presyncope or syncope.  Though she has a chest discomfort is less frequent or intense than before the stent.   ROS:   As stated in the HPI and negative for all other systems.  Studies Reviewed:    EKG:   EKG Interpretation Date/Time:  Wednesday March 12 2024 16:31:37 EST Ventricular Rate:  53 PR Interval:  178 QRS Duration:  102 QT Interval:  434 QTC Calculation: 407 R Axis:   -81  Text Interpretation: Sinus bradycardia Left axis deviation RSR' or QR pattern in V1 suggests right ventricular conduction delay Anterior  infarct , age undetermined T wave abnormality, consider lateral ischemia  is slighlty more prominant than previous Confirmed by Schilling Lynwood ([id number]) on 03/12/2024 4:53:09 PM    Risk Assessment/Calculations:              Physical Exam:   VS:  BP 138/76   Pulse (!) 53   Ht 5' 4 (1.626 m)   Wt 159 lb (72.1 kg)   LMP 01/22/1981 (LMP Unknown)   BMI 27.29 kg/m    Wt Readings from Last 3 Encounters:  03/12/24 159 lb (72.1 kg)  02/04/24 158 lb 12.8 oz (72 kg)  01/08/24 160 lb (72.6 kg)     GEN: Well nourished, well developed in no acute distress NECK: No JVD; No carotid bruits CARDIAC: RRR, no murmurs, rubs, gallops RESPIRATORY:  Clear to auscultation without rales, wheezing or rhonchi  ABDOMEN: Soft, non-tender, non-distended EXTREMITIES:  No edema; No deformity   ASSESSMENT AND PLAN:   Hypertension: Her blood pressure is mildly elevated.  However, hopefully it will be at target with the increase of her isosorbide .  She will continue the other meds as listed.   CAD: She does continue to have some chest discomfort which may be related to the branch vessel stenosis that she had.  I am going to increase her isosorbide  to 120 mg daily.    Nonischemic cardiomyopathy:   EF improved from 30 to 65%  with the last echo in July 2025 .  No change in therapy.      DVT: This was unprovoked.  I am going to keep her on her current DOAC but probably will reduce the dose to prophylaxis dosing in the future.    Aortic atherosclerosis:.   We are pursuing aggressive risk reduction.  No change in therapy.  Dyslipidemia: LDL goal was 55.    Follow up with me in six months  Signed, Lynwood Schilling, MD

## 2024-03-11 ENCOUNTER — Encounter (HOSPITAL_COMMUNITY)

## 2024-03-11 ENCOUNTER — Encounter (HOSPITAL_COMMUNITY): Payer: Self-pay | Admitting: *Deleted

## 2024-03-11 DIAGNOSIS — Z955 Presence of coronary angioplasty implant and graft: Secondary | ICD-10-CM

## 2024-03-11 NOTE — Progress Notes (Signed)
 Cardiac Individual Treatment Plan  Patient Details  Name: [name] MRN: [id number] Date of Birth: 06-Jan-1946 Referring Provider:   Flowsheet Row CARDIAC REHAB PHASE II ORIENTATION from 10/31/2023 in [facility] CARDIAC REHABILITATION  Referring Provider Lavona Agent MD    Initial Encounter Date:  Flowsheet Row CARDIAC REHAB PHASE II ORIENTATION from 10/31/2023 in [place] CARDIAC REHABILITATION  Date 10/31/23    Visit Diagnosis: Status post coronary artery stent placement  Patient's Home Medications on Admission:  Current Outpatient Medications:    acetaminophen  (TYLENOL ) 500 MG tablet, Take 1,000 mg by mouth every 6 (six) hours as needed for moderate pain or headache., Disp: , Rfl:    albuterol  (PROVENTIL ) (2.5 MG/3ML) 0.083% nebulizer solution, Take 3 mLs (2.5 mg total) by nebulization every 6 (six) hours as needed for wheezing or shortness of breath., Disp: 150 mL, Rfl: 1   albuterol  (VENTOLIN  HFA) 108 (90 Base) MCG/ACT inhaler, Inhale 2 puffs into the lungs every 6 (six) hours as needed for wheezing or shortness of breath., Disp: 18 g, Rfl: 0   alendronate  (FOSAMAX ) 70 MG tablet, Take 1 tablet (70 mg total) by mouth every 7 (seven) days. Take with a full glass of water  on an empty stomach., Disp: 12 tablet, Rfl: 3   amLODipine  (NORVASC ) 10 MG tablet, Take 1 tablet (10 mg total) by mouth daily., Disp: 90 tablet, Rfl: 2   apixaban  (ELIQUIS ) 5 MG TABS tablet, Take 1 tablet (5 mg total) by mouth 2 (two) times daily. (Patient not taking: Reported on 02/29/2024), Disp: 60 tablet, Rfl: 5   aspirin  81 MG chewable tablet, Chew 1 tablet (81 mg total) by mouth daily., Disp: , Rfl:    bisoprolol  (ZEBETA ) 5 MG tablet, Take 1 tablet (5 mg total) by mouth daily. (Patient not taking: Reported on 02/29/2024), Disp: 90 tablet, Rfl: 3   clopidogrel  (PLAVIX ) 75 MG tablet, Take 1 tablet (75 mg total) by mouth daily with breakfast., Disp: 30 tablet, Rfl: 5   dapagliflozin  propanediol (FARXIGA ) 10  MG TABS tablet, Take 1 tablet (10 mg total) by mouth daily., Disp: 90 tablet, Rfl: 4   erythromycin  ophthalmic ointment, Place 1 Application into the right eye at bedtime., Disp: 3.5 g, Rfl: 0   furosemide  (LASIX ) 20 MG tablet, Take 1 tablet (20 mg total) by mouth daily., Disp: 90 tablet, Rfl: 1   glucose blood (ONETOUCH VERIO) test strip, Use as instructed, Disp: 100 each, Rfl: 12   isosorbide  mononitrate (IMDUR ) 30 MG 24 hr tablet, Take 1 tablet (30 mg total) by mouth daily., Disp: 90 tablet, Rfl: 3   nitroGLYCERIN  (NITROSTAT ) 0.4 MG SL tablet, Place 1 tablet (0.4 mg total) under the tongue every 5 (five) minutes as needed for chest pain., Disp: 25 tablet, Rfl: 2   omeprazole  (PRILOSEC) 20 MG capsule, Take 1 capsule (20 mg total) by mouth daily., Disp: 90 capsule, Rfl: 1   ONETOUCH DELICA LANCETS FINE MISC, Use to check BG once daily, Disp: 100 each, Rfl: 2   rosuvastatin  (CRESTOR ) 40 MG tablet, Take 1 tablet (40 mg total) by mouth daily. (Patient not taking: Reported on 02/29/2024), Disp: 30 tablet, Rfl: 11   sacubitril -valsartan  (ENTRESTO ) 97-103 MG, Take 1 tablet by mouth 2 (two) times daily. (Patient not taking: Reported on 02/29/2024), Disp: 60 tablet, Rfl: 11   spironolactone  (ALDACTONE ) 25 MG tablet, Take 1 tablet (25 mg total) by mouth daily., Disp: 30 tablet, Rfl: 6   triamcinolone  ointment (KENALOG ) 0.5 %, Apply 1 Application topically 2 (two)  times daily., Disp: 60 g, Rfl: 0  Past Medical History: Past Medical History:  Diagnosis Date   Asthma    Chronic systolic heart failure (HCC)    Echo (08/26/13):  Mild LVH. EF 25% to 30%. Diffuse HK. Aortic valve: There was trivial regurgitation. Mitral valve: There was mild regurgitation. Left atrium: The atrium was mildly dilated.   GERD (gastroesophageal reflux disease)    Headache(784.0)    History of kidney stones    History of nephrolithiasis    Hypertension    NICM (nonischemic cardiomyopathy) (HCC)    a. LHC (08/2013):  no CAD    Sleep apnea    does not use machine   Stroke (HCC)    Type II or unspecified type diabetes mellitus without mention of complication, uncontrolled    borderline    Tobacco Use: Social History   Tobacco Use  Smoking Status Never  Smokeless Tobacco Never    Labs: Review Flowsheet  More data exists      Latest Ref Rng & Units 05/05/2021 08/08/2021 10/25/2021 11/16/2022 12/31/2023  Labs for ITP Cardiac and Pulmonary Rehab  Cholestrol 100 - 199 mg/dL 791  - - 786  833   LDL (calc) 0 - 99 mg/dL 874  - - 861  95   HDL-C >39 mg/dL 40  - - 44  42   Trlycerides 0 - 149 mg/dL 759  - - 824  831   Hemoglobin A1c 4.8 - 5.6 % 7.1  6.9  7.1  7.5  6.8     Capillary Blood Glucose: Lab Results  Component Value Date   GLUCAP 302 (H) 10/04/2023   GLUCAP 148 (H) 10/04/2023   GLUCAP 135 (H) 10/27/2021   GLUCAP 135 (H) 10/06/2021   GLUCAP 140 (H) 10/06/2021     Exercise Target Goals: Exercise Program Goal: Individual exercise prescription set using results from initial 6 min walk test and THRR while considering  patient's activity barriers and safety.   Exercise Prescription Goal: Starting with aerobic activity 30 plus minutes a day, 3 days per week for initial exercise prescription. Provide home exercise prescription and guidelines that participant acknowledges understanding prior to discharge.  Activity Barriers & Risk Stratification:  Activity Barriers & Cardiac Risk Stratification - 10/31/23 1414       Activity Barriers & Cardiac Risk Stratification   Activity Barriers History of Falls;Balance Concerns;Muscular Weakness;Deconditioning    Cardiac Risk Stratification Moderate          6 Minute Walk:  6 Minute Walk     Row Name 10/31/23 1507         6 Minute Walk   Phase Initial     Distance 895 feet     Walk Time 6 minutes     # of Rest Breaks 0     MPH 1.69     METS 1.38     RPE 11     Perceived Dyspnea  3     VO2 Peak 4.84     Symptoms Yes (comment)     Comments SOB,  wearing flip flops     Resting HR 60 bpm     Resting BP 122/66     Resting Oxygen Saturation  98 %     Exercise Oxygen Saturation  during 6 min walk 99 %     Max Ex. HR 92 bpm     Max Ex. BP 126/74     2 Minute Post BP 106/70  Oxygen Initial Assessment:   Oxygen Re-Evaluation:   Oxygen Discharge (Final Oxygen Re-Evaluation):   Initial Exercise Prescription:  Initial Exercise Prescription - 10/31/23 1500       Date of Initial Exercise RX and Referring Provider   Date 10/31/23    Referring Provider Lavona Agent MD      Oxygen   Maintain Oxygen Saturation 88% or higher      Treadmill   MPH 1.5    Grade 0.5    Minutes 15    METs 2.25      NuStep   Level 1    SPM 80    Minutes 15    METs 2.2      Prescription Details   Frequency (times per week) 2    Duration Progress to 30 minutes of continuous aerobic without signs/symptoms of physical distress      Intensity   THRR 40-80% of Max Heartrate 93-126    Ratings of Perceived Exertion 11-13    Perceived Dyspnea 0-4      Progression   Progression Continue to progress workloads to maintain intensity without signs/symptoms of physical distress.      Resistance Training   Training Prescription Yes    Weight 3 lb    Reps 10-15          Perform Capillary Blood Glucose checks as needed.  Exercise Prescription Changes:   Exercise Prescription Changes     Row Name 10/31/23 1500 11/08/23 1500 12/04/23 1500 01/03/24 1500 01/22/24 1500     Response to Exercise   Blood Pressure (Admit) 122/60 140/70 120/64 110/60 150/78   Blood Pressure (Exercise) 126/74 122/62 136/82 -- --   Blood Pressure (Exit) 106/70 148/60 110/70 118/68 128/64   Heart Rate (Admit) 60 bpm 55 bpm 75 bpm 91 bpm 66 bpm   Heart Rate (Exercise) 92 bpm 94 bpm 117 bpm 101 bpm 103 bpm   Heart Rate (Exit) 70 bpm 51 bpm 90 bpm 72 bpm 69 bpm   Oxygen Saturation (Admit) 98 % -- -- -- --   Oxygen Saturation (Exercise) 99 % -- -- -- --    Rating of Perceived Exertion (Exercise) 11 12 15 12 12    Perceived Dyspnea (Exercise) 3 -- -- -- --   Symptoms SOB -- -- -- --   Comments walk test results (in flip flops) -- -- -- --   Duration -- Continue with 30 min of aerobic exercise without signs/symptoms of physical distress. Continue with 30 min of aerobic exercise without signs/symptoms of physical distress. Continue with 30 min of aerobic exercise without signs/symptoms of physical distress. Continue with 30 min of aerobic exercise without signs/symptoms of physical distress.   Intensity -- THRR unchanged THRR unchanged THRR unchanged THRR unchanged     Progression   Progression -- Continue to follow PAD protocol Continue to progress workloads to maintain intensity without signs/symptoms of physical distress. Continue to progress workloads to maintain intensity without signs/symptoms of physical distress. Continue to progress workloads to maintain intensity without signs/symptoms of physical distress.     Resistance Training   Training Prescription -- Yes Yes Yes Yes   Weight -- 3 3 3 3    Reps -- 10-15 10-15 10-15 10-15     Treadmill   MPH -- 1 1.2 1.4 1.5   Grade -- 0 0 0 0   Minutes -- 15 15 15 15    METs -- 1.77 1.92 2.07 2.15     NuStep   Level -- 1 2  2 1   SPM -- -- 38 66 55   Minutes -- 15 15 15 15    METs -- 1.4 1.8 1.6 1.6    Row Name 02/26/24 1500             Response to Exercise   Blood Pressure (Admit) 110/60       Blood Pressure (Exit) 108/60       Heart Rate (Admit) 90 bpm       Heart Rate (Exercise) 100 bpm       Heart Rate (Exit) 54 bpm       Rating of Perceived Exertion (Exercise) 14       Duration Continue with 30 min of aerobic exercise without signs/symptoms of physical distress.       Intensity THRR unchanged         Progression   Progression Continue to progress workloads to maintain intensity without signs/symptoms of physical distress.         Resistance Training   Training Prescription  Yes       Weight 3       Reps 10-15         Treadmill   MPH 1.2       Grade 0       Minutes 15       METs 1.92         NuStep   Level 1       SPM 65       Minutes 15       METs 1.6          Exercise Comments:   Exercise Comments     Row Name 11/06/23 1337           Exercise Comments First full day of exercise!  Patient was oriented to gym and equipment including functions, settings, policies, and procedures.  Patient's individual exercise prescription and treatment plan were reviewed.  All starting workloads were established based on the results of the 6 minute walk test done at initial orientation visit.  The plan for exercise progression was also introduced and progression will be customized based on patient's performance and goals.          Exercise Goals and Review:   Exercise Goals     Row Name 10/31/23 1510             Exercise Goals   Increase Physical Activity Yes       Intervention Provide advice, education, support and counseling about physical activity/exercise needs.;Develop an individualized exercise prescription for aerobic and resistive training based on initial evaluation findings, risk stratification, comorbidities and participant's personal goals.       Expected Outcomes Short Term: Attend rehab on a regular basis to increase amount of physical activity.;Long Term: Exercising regularly at least 3-5 days a week.;Long Term: Add in home exercise to make exercise part of routine and to increase amount of physical activity.       Increase Strength and Stamina Yes       Intervention Provide advice, education, support and counseling about physical activity/exercise needs.;Develop an individualized exercise prescription for aerobic and resistive training based on initial evaluation findings, risk stratification, comorbidities and participant's personal goals.       Expected Outcomes Short Term: Increase workloads from initial exercise prescription for  resistance, speed, and METs.;Short Term: Perform resistance training exercises routinely during rehab and add in resistance training at home;Long Term: Improve cardiorespiratory fitness, muscular endurance and strength as measured  by increased METs and functional capacity ( )       Able to understand and use rate of perceived exertion (RPE) scale Yes       Intervention Provide education and explanation on how to use RPE scale       Expected Outcomes Short Term: Able to use RPE daily in rehab to express subjective intensity level;Long Term:  Able to use RPE to guide intensity level when exercising independently       Able to understand and use Dyspnea scale Yes       Intervention Provide education and explanation on how to use Dyspnea scale       Expected Outcomes Short Term: Able to use Dyspnea scale daily in rehab to express subjective sense of shortness of breath during exertion;Long Term: Able to use Dyspnea scale to guide intensity level when exercising independently       Knowledge and understanding of Target Heart Rate Range (THRR) Yes       Intervention Provide education and explanation of THRR including how the numbers were predicted and where they are located for reference       Expected Outcomes Short Term: Able to state/look up THRR;Long Term: Able to use THRR to govern intensity when exercising independently;Short Term: Able to use daily as guideline for intensity in rehab       Able to check pulse independently Yes       Intervention Provide education and demonstration on how to check pulse in carotid and radial arteries.;Review the importance of being able to check your own pulse for safety during independent exercise       Expected Outcomes Short Term: Able to explain why pulse checking is important during independent exercise;Long Term: Able to check pulse independently and accurately       Understanding of Exercise Prescription Yes       Intervention Provide education, explanation,  and written materials on patient's individual exercise prescription       Expected Outcomes Short Term: Able to explain program exercise prescription;Long Term: Able to explain home exercise prescription to exercise independently          Exercise Goals Re-Evaluation :  Exercise Goals Re-Evaluation     Row Name 12/11/23 1443 01/08/24 1521 02/26/24 1528         Exercise Goal Re-Evaluation   Exercise Goals Review Increase Physical Activity;Increase Strength and Stamina;Able to understand and use rate of perceived exertion (RPE) scale;Knowledge and understanding of Target Heart Rate Range (THRR);Understanding of Exercise Prescription;Able to check pulse independently Increase Physical Activity;Increase Strength and Stamina;Able to understand and use Dyspnea scale;Able to check pulse independently;Understanding of Exercise Prescription;Knowledge and understanding of Target Heart Rate Range (THRR);Able to understand and use rate of perceived exertion (RPE) scale Increase Physical Activity;Increase Strength and Stamina;Understanding of Exercise Prescription     Comments Kristiana is doing great in rehab. She does notes she is not excercising at home at all. I asked if there was anywhere she could walk at home and she noted that [place] road that she could walk. Jashawna is doing good in rehab. She notes she is still not exercising at home, she is still in the process of moving so she says she is lifting boxes. Mentioned again to her about trying to at least walk everyday for 15-30 minutes. [name] is doing well in rehab. She does not exericse much at home. She has been tryign to attend rehab on time but is sometime late.  She is increasing walking speed in class     Expected Outcomes Short: continue to attend rehab. Long: Walking everyday for at least 30 minutes. Short: continue to attend rehab. Long: Walking everyday for at least 30 minutes. Short: continue to attend rehab. Long: Walking everyday for  at least 30 minutes.         Discharge Exercise Prescription (Final Exercise Prescription Changes):  Exercise Prescription Changes - 02/26/24 1500       Response to Exercise   Blood Pressure (Admit) 110/60    Blood Pressure (Exit) 108/60    Heart Rate (Admit) 90 bpm    Heart Rate (Exercise) 100 bpm    Heart Rate (Exit) 54 bpm    Rating of Perceived Exertion (Exercise) 14    Duration Continue with 30 min of aerobic exercise without signs/symptoms of physical distress.    Intensity THRR unchanged      Progression   Progression Continue to progress workloads to maintain intensity without signs/symptoms of physical distress.      Resistance Training   Training Prescription Yes    Weight 3    Reps 10-15      Treadmill   MPH 1.2    Grade 0    Minutes 15    METs 1.92      NuStep   Level 1    SPM 65    Minutes 15    METs 1.6          Nutrition:  Target Goals: Understanding of nutrition guidelines, daily intake of sodium 1500mg , cholesterol 200mg , calories 30% from fat and 7% or less from saturated fats, daily to have 5 or more servings of fruits and vegetables.  Biometrics:  Pre Biometrics - 10/31/23 1510       Pre Biometrics   Height 5' 0.5 (1.537 m)    Weight 158 lb (71.7 kg)    Waist Circumference 35.25 inches    Hip Circumference 41.25 inches    Waist to Hip Ratio 0.85 %    BMI (Calculated) 30.34    Grip Strength 9.1 kg    Single Leg Stand 4 seconds           Nutrition Therapy Plan and Nutrition Goals:  Nutrition Therapy & Goals - 10/31/23 1515       Intervention Plan   Intervention Prescribe, educate and counsel regarding individualized specific dietary modifications aiming towards targeted core components such as weight, hypertension, lipid management, diabetes, heart failure and other comorbidities.;Nutrition handout(s) given to patient.    Expected Outcomes Short Term Goal: Understand basic principles of dietary content, such as calories, fat,  sodium, cholesterol and nutrients.;Long Term Goal: Adherence to prescribed nutrition plan.          Nutrition Assessments:  MEDIFICTS Score Key: >=70 Need to make dietary changes  40-70 Heart Healthy Diet <= 40 Therapeutic Level Cholesterol Diet  Flowsheet Row CARDIAC REHAB PHASE II ORIENTATION from 10/31/2023 in [facility] CARDIAC REHABILITATION  Picture Your Plate Total Score on Admission 60   Picture Your Plate Scores: <59 Unhealthy dietary pattern with much room for improvement. 41-50 Dietary pattern unlikely to meet recommendations for good health and room for improvement. 51-60 More healthful dietary pattern, with some room for improvement.  >60 Healthy dietary pattern, although there may be some specific behaviors that could be improved.    Nutrition Goals Re-Evaluation:  Nutrition Goals Re-Evaluation     Row Name 12/11/23 1435 01/08/24 1514 02/26/24 1531  Goals   Nutrition Goal -- -- healthy eating     Comment Lorielle notes she is eating pretty good. She says she is watching her salt and sugar intake due to her high b/p and diabeties. Maicie still is trying to watch her salt and sugar intake. She notes salt has been harder to cut back than sugar. Garima likes to add salt to her foods, spoke with her about trying to stop adding salt. Chole still is trying to watch her salt and sugar intake. She notes salt has been harder to cut back than sugar due to her being a salty person. She does not use the salt shaker much anymore. She loves sweet tea and we tallked about doing half and half tea to help cut back on some of the sugar.     Expected Outcome Short: continue to monitor salts and sugars in diet. Long: maintain healthy diet and eat well balanced meals. Short: To stop adding salt to foods. Long: maintain healthy diet and eat well balanced meals. Short: try half and half tea    long: continue to cut salt and eating healthy diet        Nutrition Goals Discharge  (Final Nutrition Goals Re-Evaluation):  Nutrition Goals Re-Evaluation - 02/26/24 1531       Goals   Nutrition Goal healthy eating    Comment Shaniquia still is trying to watch her salt and sugar intake. She notes salt has been harder to cut back than sugar due to her being a salty person. She does not use the salt shaker much anymore. She loves sweet tea and we tallked about doing half and half tea to help cut back on some of the sugar.    Expected Outcome Short: try half and half tea    long: continue to cut salt and eating healthy diet          Psychosocial: Target Goals: Acknowledge presence or absence of significant depression and/or stress, maximize coping skills, provide positive support system. Participant is able to verbalize types and ability to use techniques and skills needed for reducing stress and depression.  Initial Review & Psychosocial Screening:  Initial Psych Review & Screening - 10/31/23 1418       Initial Review   Current issues with History of Depression;Current Stress Concerns    Source of Stress Concerns Unable to participate in former interests or hobbies;Unable to perform yard/household activities;Financial    Comments In process of moving.  Was living with daughter in [place], moving to [place] to be closer to work      [employer]   Good Support System? Yes   Daughter, friends from church     Barriers   Psychosocial barriers to participate in program Psychosocial barriers identified (see note);The patient should benefit from training in stress management and relaxation.      Screening Interventions   Interventions Encouraged to exercise;To provide support and resources with identified psychosocial needs;Provide feedback about the scores to participant    Expected Outcomes Short Term goal: Utilizing psychosocial counselor, staff and physician to assist with identification of specific Stressors or current issues interfering with healing process.  Setting desired goal for each stressor or current issue identified.;Long Term Goal: Stressors or current issues are controlled or eliminated.;Short Term goal: Identification and review with participant of any Quality of Life or Depression concerns found by scoring the questionnaire.;Long Term goal: The participant improves quality of Life and PHQ9 Scores as seen by post scores and/or verbalization  of changes          Quality of Life Scores:  Quality of Life - 10/31/23 1514       Quality of Life   Select Quality of Life      Quality of Life Scores   Health/Function Pre 22.17 %    Socioeconomic Pre 19 %    Psych/Spiritual Pre 22.71 %    Family Pre 25.8 %    GLOBAL Pre 22.07 %         Scores of 19 and below usually indicate a poorer quality of life in these areas.  A difference of  2-3 points is a clinically meaningful difference.  A difference of 2-3 points in the total score of the Quality of Life Index has been associated with significant improvement in overall quality of life, self-image, physical symptoms, and general health in studies assessing change in quality of life.  PHQ-9: Review Flowsheet  More data exists      01/08/2024 01/03/2024 12/31/2023 11/27/2023 10/31/2023  Depression screen PHQ 2/9  Decreased Interest 0 0 0 1 1  Down, Depressed, Hopeless 0 0 0 1 1  PHQ - 2 Score 0 0 0 2 2  Altered sleeping 0 0 2 1 0  Tired, decreased energy 0 0 0 2 3  Change in appetite 0 0 0 0 0  Feeling bad or failure about yourself  0 0 2 1 1   Trouble concentrating 0 0 2 1 1   Moving slowly or fidgety/restless 0 0 0 0 0  Suicidal thoughts 0 0 0 0 0  PHQ-9 Score 0  0  6  7  7    Difficult doing work/chores Not difficult at all Not difficult at all Somewhat difficult Somewhat difficult Somewhat difficult    Details       Data saved with a previous flowsheet row definition        Interpretation of Total Score  Total Score Depression Severity:  1-4 = Minimal depression, 5-9 = Mild  depression, 10-14 = Moderate depression, 15-19 = Moderately severe depression, 20-27 = Severe depression   Psychosocial Evaluation and Intervention:  Psychosocial Evaluation - 10/31/23 1511       Psychosocial Evaluation & Interventions   Comments Nelsie is coming into cardiac rehab after having a stent placed in June.  She is also in the middle of moving from [place] to [place] to be closer to work.  She was living with her daughter (her biggest supporter) but wanted to move out to be independent and close to work since she goes in 4 days a week. 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  She is eager to build her strength and stamina back up. She wants to get into a routine for herself again.  She usually sleeps well.  She does not precieve any issues with getting to rehab reguarly.  She does have a copay and will let us  know if it gets to be too much. She has a history of meds but is no longer needing them.  She is also off of her type 2 DM meds which she is very proud of herself.    Expected Outcomes Short: Attend rehab to build up stamina. Long: Continue to improve strength    Continue Psychosocial Services  Follow up required by staff          Psychosocial Re-Evaluation:  Psychosocial Re-Evaluation     Row Name 12/11/23 1432 01/08/24 1507 02/26/24 1529  Psychosocial Re-Evaluation   Current issues with Current Sleep Concerns;Current Stress Concerns Current Stress Concerns;Current Sleep Concerns Current Stress Concerns;Current Sleep Concerns     Comments Marijose notes she is in the middle of moving, she is moving out of her daughters house into a house to herself, so she is having some stress from that. She also notes she wakes up 2-3 times a night and she thinks it is her b/p meds causing this. Nastacia is still moving currently, but notes she is mostly finished. Her new home is smaller than her old home so she is adjusting to that, but her new home is closer  to her work so that is nice for her. She notes her sleep is still rough waking up around 2 am most mornings and can't go back to sleep. Her doctor added another b/p med for her but she doesn't think it's helping. Chaney is doing well in rehab. She has a lot of stress right now due to his sister not doing well. She stated that she hates to answer the phone now incrase that is someone telling her her sister passed. She has not been sleeping well either due to the stress asn worrying     Expected Outcomes Short: speak with her PCP about her b/p meds and her sleeping concerns. Long: continue to have healthy stress outlets. Short: speak with her PCP about her sleeping concerns. Long: continue to have healthy stress outlets. Short: find outlet to help with stress and sleep    long: continue to exericse for overall wellbeing     Interventions Relaxation education;Encouraged to attend Cardiac Rehabilitation for the exercise;Stress management education Encouraged to attend Cardiac Rehabilitation for the exercise Encouraged to attend Cardiac Rehabilitation for the exercise     Continue Psychosocial Services  Follow up required by staff Follow up required by staff Follow up required by staff        Psychosocial Discharge (Final Psychosocial Re-Evaluation):  Psychosocial Re-Evaluation - 02/26/24 1529       Psychosocial Re-Evaluation   Current issues with Current Stress Concerns;Current Sleep Concerns    Comments Tammee is doing well in rehab. She has a lot of stress right now due to his sister not doing well. She stated that she hates to answer the phone now incrase that is someone telling her her sister passed. She has not been sleeping well either due to the stress asn worrying    Expected Outcomes Short: find outlet to help with stress and sleep    long: continue to exericse for overall wellbeing    Interventions Encouraged to attend Cardiac Rehabilitation for the exercise    Continue Psychosocial Services   Follow up required by staff          Vocational Rehabilitation: Provide vocational rehab assistance to qualifying candidates.   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 Participant will state understanding/return demonstration of topics presented.  Learning Barriers/Preferences:  Learning Barriers/Preferences - 10/31/23 1416       Learning Barriers/Preferences   Learning Barriers None    Learning Preferences None          Education Topics: Hypertension, Hypertension Reduction -Define heart disease  and high blood pressure. Discus how high blood pressure affects the body and ways to reduce high blood pressure.   Exercise and Your Heart -Discuss why it is important to exercise, the FITT principles of exercise, normal and abnormal responses to exercise, and how to exercise safely.   Angina -Discuss definition of angina, causes of angina, treatment of angina, and how to decrease risk of having angina.   Cardiac Medications -Review what the following cardiac medications are used for, how they affect the body, and side effects that may occur when taking the medications.  Medications include Aspirin , Beta blockers, calcium  channel blockers, ACE Inhibitors, angiotensin receptor blockers, diuretics, digoxin, and antihyperlipidemics. Flowsheet Row CARDIAC REHAB PHASE II EXERCISE from 11/08/2023 in [place] [facility] [facility]  Date 11/08/23  Educator [facility]  Instruction Review Code 1- Verbalizes Understanding    Congestive Heart Failure -Discuss the definition of CHF, how to live with CHF, the signs and symptoms of CHF, and how keep track of weight and sodium  intake.   Heart Disease and Intimacy -Discus the effect sexual activity has on the heart, how changes occur during intimacy as we age, and safety during sexual activity.   Smoking Cessation / COPD -Discuss different methods to quit smoking, the health benefits of quitting smoking, and the definition of COPD.   Nutrition I: Fats -Discuss the types of cholesterol, what cholesterol does to the heart, and how cholesterol levels can be controlled.   Nutrition II: Labels -Discuss the different components of food labels and how to read food label   Heart Parts/Heart Disease and PAD -Discuss the anatomy of the heart, the pathway of blood circulation through the heart, and these are affected by heart disease.   Stress I: Signs and Symptoms -Discuss the causes of stress, how stress may lead to anxiety and depression, and ways to limit stress.   Stress II: Relaxation -Discuss different types of relaxation techniques to limit stress.   Warning Signs of Stroke / TIA -Discuss definition of a stroke, what the signs and symptoms are of a stroke, and how to identify when someone is having stroke.   Knowledge Questionnaire Score:  Knowledge Questionnaire Score - 10/31/23 1515       Knowledge Questionnaire Score   Pre Score 22/26          Core Components/Risk Factors/Patient Goals at Admission:  Personal Goals and Risk Factors at Admission - 10/31/23 1515       Core Components/Risk Factors/Patient Goals on Admission    Weight Management Yes;Weight Loss;Obesity    Intervention Weight Management: Develop a combined nutrition and exercise program designed to reach desired caloric intake, while maintaining appropriate intake of nutrient and fiber, sodium and fats, and appropriate energy expenditure required for the weight goal.;Weight Management: Provide education and appropriate resources to help participant work on and attain dietary goals.;Weight Management/Obesity: Establish  reasonable short term and long term weight goals.;Obesity: Provide education and appropriate resources to help participant work on and attain dietary goals.    Admit Weight 158 lb (71.7 kg)    Goal Weight: Short Term 155 lb (70.3 kg)    Goal Weight: Long Term 150 lb (68 kg)    Expected Outcomes Short Term: Continue to assess and modify interventions until short term weight is achieved;Long Term: Adherence to nutrition and physical activity/exercise program aimed toward attainment of established weight goal;Weight Loss: Understanding of general recommendations for a balanced deficit meal plan, which promotes 1-2 lb weight loss per week and includes  a negative energy balance of [id number] kcal/d;Understanding recommendations for meals to include 15-35% energy as protein, 25-35% energy from fat, 35-60% energy from carbohydrates, less than 200mg  of dietary cholesterol, 20-35 gm of total fiber daily;Understanding of distribution of calorie intake throughout the day with the consumption of 4-5 meals/snacks    Improve shortness of breath with ADL's Yes    Intervention Provide education, individualized exercise plan and daily activity instruction to help decrease symptoms of SOB with activities of daily living.    Expected Outcomes Short Term: Improve cardiorespiratory fitness to achieve a reduction of symptoms when performing ADLs;Long Term: Be able to perform more ADLs without symptoms or delay the onset of symptoms    Diabetes Yes   no longer on meds or checking sugars   Intervention Provide education about signs/symptoms and action to take for hypo/hyperglycemia.;Provide education about proper nutrition, including hydration, and aerobic/resistive exercise prescription along with prescribed medications to achieve blood glucose in normal ranges: Fasting glucose 65-99 mg/dL    Expected Outcomes Short Term: Participant verbalizes understanding of the signs/symptoms and immediate care of hyper/hypoglycemia, proper  foot care and importance of medication, aerobic/resistive exercise and nutrition plan for blood glucose control.    Heart Failure Yes    Intervention Provide a combined exercise and nutrition program that is supplemented with education, support and counseling about heart failure. Directed toward relieving symptoms such as shortness of breath, decreased exercise tolerance, and extremity edema.    Expected Outcomes Improve functional capacity of life;Short term: Attendance in program 2-3 days a week with increased exercise capacity. Reported lower sodium intake. Reported increased fruit and vegetable intake. Reports medication compliance.;Short term: Daily weights obtained and reported for increase. Utilizing diuretic protocols set by physician.;Long term: Adoption of self-care skills and reduction of barriers for early signs and symptoms recognition and intervention leading to self-care maintenance.    Hypertension Yes    Intervention Provide education on lifestyle modifcations including regular physical activity/exercise, weight management, moderate sodium restriction and increased consumption of fresh fruit, vegetables, and low fat dairy, alcohol  moderation, and smoking cessation.;Monitor prescription use compliance.    Expected Outcomes Short Term: Continued assessment and intervention until BP is < 140/38mm HG in hypertensive participants. < 130/71mm HG in hypertensive participants with diabetes, heart failure or chronic kidney disease.;Long Term: Maintenance of blood pressure at goal levels.    Lipids Yes    Intervention Provide education and support for participant on nutrition & aerobic/resistive exercise along with prescribed medications to achieve LDL 70mg , HDL >40mg .    Expected Outcomes Short Term: Participant states understanding of desired cholesterol values and is compliant with medications prescribed. Participant is following exercise prescription and nutrition guidelines.;Long Term:  Cholesterol controlled with medications as prescribed, with individualized exercise RX and with personalized nutrition plan. Value goals: LDL < 70mg , HDL > 40 mg.          Core Components/Risk Factors/Patient Goals Review:   Goals and Risk Factor Review     Row Name 12/11/23 1438 01/08/24 1517 02/26/24 1534         Core Components/Risk Factors/Patient Goals Review   Personal Goals Review Hypertension;Diabetes Improve shortness of breath with ADL's;Develop more efficient breathing techniques such as purse lipped breathing and diaphragmatic breathing and practicing self-pacing with activity.;Hypertension;Diabetes Improve shortness of breath with ADL's;Develop more efficient breathing techniques such as purse lipped breathing and diaphragmatic breathing and practicing self-pacing with activity.;Hypertension;Diabetes     Review Vinie notes she is on 3 different b/p meds. Also notes  she has type 2 diabeties and she is on no medication for,she also does not check her sugar at home. I mentioned to her to speak with her PCP about checking her A1C. Zyann's PCP added Fargixa to her meds for her diabetes, she is still not checking her blood sugar at home, but plans to start, mentioned to her it is important to know her blood glucose levels. She also notes she is not checking her b/p at home much either, mentioned to her that was alos just as important especially since she is on 3 different b/p meds. Evaleigh is taking her medications as percribesd. She is dealing with her sister not dooing well and this has been very stressful for her,     Expected Outcomes Short: communicate with PCP about checking her A1C. Long: continue to follow up with her PCP and follow medication prescriptions. Short: start checking her b/p and blood glucose daily at home. Long: continue to follow up with her PCP and follow medication prescriptions. Short: start checking her b/p and blood glucose daily at home. Long: continue to follow  up with her PCP and follow medication prescriptions.        Core Components/Risk Factors/Patient Goals at Discharge (Final Review):   Goals and Risk Factor Review - 02/26/24 1534       Core Components/Risk Factors/Patient Goals Review   Personal Goals Review Improve shortness of breath with ADL's;Develop more efficient breathing techniques such as purse lipped breathing and diaphragmatic breathing and practicing self-pacing with activity.;Hypertension;Diabetes    Review Ashlin is taking her medications as percribesd. She is dealing with her sister not dooing well and this has been very stressful for her,    Expected Outcomes Short: start checking her b/p and blood glucose daily at home. Long: continue to follow up with her PCP and follow medication prescriptions.          ITP Comments:  ITP Comments     Row Name 10/31/23 1503 11/06/23 1337 11/21/23 1248 12/19/23 1519 01/16/24 1817   ITP Comments Patient attend orientation today.  Patient is attending Cardiac Rehabilitation Program.  Documentation for diagnosis can be found in 10/05/23.  Reviewed medical chart, RPE/RPD, gym safety, and program guidelines.  Patient was fitted to equipment they will be using during rehab.  Patient is scheduled to start exercise on Tuesday 11/06/23 at 1330.   Initial ITP created and sent for review and signature by Dr. Dorn Ross, Medical Director for Cardiac Rehabilitation Program. First full day of exercise!  Patient was oriented to gym and equipment including functions, settings, policies, and procedures.  Patient's individual exercise prescription and treatment plan were reviewed.  All starting workloads were established based on the results of the 6 minute walk test done at initial orientation visit.  The plan for exercise progression was also introduced and progression will be customized based on patient's performance and goals. 30 day review completed. ITP sent to Dr. Dorn Ross, Medical Director of  Cardiac Rehab. Continue with ITP unless changes are made by physician.  Newer to program. 30 day review completed. ITP sent to Dr. Dorn Ross, Medical Director of Cardiac Rehab. Continue with ITP unless changes are made by physician. 30 day review completed. ITP sent to Dr. Dorn Ross, Medical Director of Cardiac Rehab. Continue with ITP unless changes are made by physician.    Row Name 02/13/24 0925 03/11/24 1134         ITP Comments 30 day review completed. ITP sent to Dr.  Dorn Ross, Medical Director of Cardiac Rehab. Continue with ITP unless changes are made by physician. 30 day review completed. ITP sent to Dr. Dorn Ross, Medical Director of Cardiac Rehab. Continue with ITP unless changes are made by physician.         Comments: 30 day review

## 2024-03-12 ENCOUNTER — Encounter: Payer: Self-pay | Admitting: Cardiology

## 2024-03-12 ENCOUNTER — Ambulatory Visit (INDEPENDENT_AMBULATORY_CARE_PROVIDER_SITE_OTHER): Admitting: Cardiology

## 2024-03-12 ENCOUNTER — Other Ambulatory Visit

## 2024-03-12 VITALS — BP 138/76 | HR 53 | Ht 64.0 in | Wt 159.0 lb

## 2024-03-12 DIAGNOSIS — I2510 Atherosclerotic heart disease of native coronary artery without angina pectoris: Secondary | ICD-10-CM

## 2024-03-12 DIAGNOSIS — I700 Atherosclerosis of aorta: Secondary | ICD-10-CM | POA: Diagnosis not present

## 2024-03-12 DIAGNOSIS — I10 Essential (primary) hypertension: Secondary | ICD-10-CM

## 2024-03-12 DIAGNOSIS — Z86718 Personal history of other venous thrombosis and embolism: Secondary | ICD-10-CM | POA: Diagnosis not present

## 2024-03-12 DIAGNOSIS — E785 Hyperlipidemia, unspecified: Secondary | ICD-10-CM | POA: Diagnosis not present

## 2024-03-12 DIAGNOSIS — I428 Other cardiomyopathies: Secondary | ICD-10-CM

## 2024-03-12 MED ORDER — ISOSORBIDE MONONITRATE ER 120 MG PO TB24: 120.0000 mg | ORAL_TABLET | Freq: Every day | ORAL | 1 refills | Status: AC

## 2024-03-12 NOTE — Patient Instructions (Addendum)
 Medication Instructions:  Your physician has recommended you make the following change in your medication:  Increase isosorbide  mononitrate to 120 mg daily Continue all other medications as prescribed  Labwork: none  Testing/Procedures: none  Follow-Up: Your physician recommends that you schedule a follow-up appointment in: 4 months  Any Other Special Instructions Will Be Listed Below (If Applicable).  If you need a refill on your cardiac medications before your next appointment, please call your pharmacy.

## 2024-03-13 ENCOUNTER — Encounter (HOSPITAL_COMMUNITY): Attending: Cardiology

## 2024-03-13 ENCOUNTER — Encounter (HOSPITAL_COMMUNITY)

## 2024-03-13 DIAGNOSIS — Z955 Presence of coronary angioplasty implant and graft: Secondary | ICD-10-CM | POA: Insufficient documentation

## 2024-03-18 ENCOUNTER — Encounter (HOSPITAL_COMMUNITY)

## 2024-03-20 ENCOUNTER — Encounter (HOSPITAL_COMMUNITY)

## 2024-03-25 ENCOUNTER — Encounter (HOSPITAL_COMMUNITY)

## 2024-03-27 ENCOUNTER — Encounter (HOSPITAL_COMMUNITY)

## 2024-04-01 ENCOUNTER — Ambulatory Visit: Payer: Self-pay | Admitting: Family

## 2024-04-01 ENCOUNTER — Encounter (HOSPITAL_COMMUNITY)

## 2024-04-01 NOTE — Telephone Encounter (Signed)
 02/19/2024 03:19 PM EST by Zina Richerd DASEN, RN  Outgoing [address], [name] (Self) [phone] (Mobile) Remove  No Answer/Busy - Missed rehab session today. No answer, left VM.

## 2024-04-04 ENCOUNTER — Ambulatory Visit: Admitting: Family

## 2024-04-04 ENCOUNTER — Encounter: Payer: Self-pay | Admitting: Family

## 2024-04-04 ENCOUNTER — Ambulatory Visit: Payer: Self-pay | Admitting: Family

## 2024-04-04 ENCOUNTER — Ambulatory Visit: Payer: Self-pay | Admitting: *Deleted

## 2024-04-04 ENCOUNTER — Telehealth: Payer: Self-pay

## 2024-04-04 VITALS — BP 197/97 | HR 101 | Temp 98.9°F | Ht 64.0 in | Wt 157.2 lb

## 2024-04-04 DIAGNOSIS — J208 Acute bronchitis due to other specified organisms: Secondary | ICD-10-CM | POA: Diagnosis not present

## 2024-04-04 DIAGNOSIS — B9689 Other specified bacterial agents as the cause of diseases classified elsewhere: Secondary | ICD-10-CM | POA: Diagnosis not present

## 2024-04-04 DIAGNOSIS — R0602 Shortness of breath: Secondary | ICD-10-CM | POA: Diagnosis not present

## 2024-04-04 DIAGNOSIS — R030 Elevated blood-pressure reading, without diagnosis of hypertension: Secondary | ICD-10-CM

## 2024-04-04 DIAGNOSIS — I10 Essential (primary) hypertension: Secondary | ICD-10-CM | POA: Diagnosis not present

## 2024-04-04 LAB — VERITOR SARS-COV-2 AND FLU A+B
BD Veritor SARS-CoV-2 Ag: NEGATIVE
Influenza A: NEGATIVE
Influenza B: NEGATIVE

## 2024-04-04 MED ORDER — ALBUTEROL SULFATE (2.5 MG/3ML) 0.083% IN NEBU: 2.5000 mg | INHALATION_SOLUTION | Freq: Four times a day (QID) | RESPIRATORY_TRACT | 1 refills | Status: AC | PRN

## 2024-04-04 MED ORDER — PREDNISONE 10 MG (21) PO TBPK: ORAL_TABLET | ORAL | 0 refills | Status: AC

## 2024-04-04 MED ORDER — DOXYCYCLINE MONOHYDRATE 100 MG PO TABS: 100.0000 mg | ORAL_TABLET | Freq: Two times a day (BID) | ORAL | 0 refills | Status: AC

## 2024-04-04 MED ORDER — ALBUTEROL SULFATE HFA 108 (90 BASE) MCG/ACT IN AERS: 2.0000 | INHALATION_SPRAY | Freq: Four times a day (QID) | RESPIRATORY_TRACT | 0 refills | Status: DC | PRN

## 2024-04-04 MED ORDER — BENZONATATE 200 MG PO CAPS: 200.0000 mg | ORAL_CAPSULE | Freq: Two times a day (BID) | ORAL | 0 refills | Status: AC | PRN

## 2024-04-04 NOTE — Patient Instructions (Signed)

## 2024-04-04 NOTE — Telephone Encounter (Signed)
 FYI Only or Action Required?: FYI only for provider: appointment scheduled on 04/04/24.  Patient was last seen in primary care on 02/04/2024 by Lavell Bari LABOR, FNP.  Called Nurse Triage reporting Cough. Wheezing chills SOB at times  Symptoms began several days ago.  Interventions attempted: OTC medications: inhaler.  Symptoms are: gradually worsening.  Triage Disposition: See HCP Within 4 Hours (Or PCP Triage)  Patient/caregiver understands and will follow disposition?: Yes                   Copied from CRM #[id number]. Topic: Clinical - Red Word Triage >> Apr 04, 2024  8:08 AM Diannia H wrote: [facility] that prompted transfer to Nurse Triage: Patient is not feeling well. She has a cough, sneezing and blowing her nose and chills. Reason for Disposition  [1] MILD difficulty breathing (e.g., minimal/no SOB at rest, SOB with walking, pulse < 100) AND [2] still present when not coughing    SOB at rest at times.  Answer Assessment - Initial Assessment Questions Appt today with DOD. Recommended if chest pain or SOB at rest worsening go to ED / call 911.     1. ONSET: When did the cough begin?      Tuesday  2. SEVERITY: How bad is the cough today?      Getting worse feels like in chest  3. SPUTUM: Describe the color of your sputum (e.g., none, dry cough; clear, white, yellow, green)     Clear  4. HEMOPTYSIS: Are you coughing up any blood? If Yes, ask: How much? (e.g., flecks, streaks, tablespoons, etc.)     na 5. DIFFICULTY BREATHING: Are you having difficulty breathing? If Yes, ask: How bad is it? (e.g., mild, moderate, severe)      Wheezing moderate  6. FEVER: Do you have a fever? If Yes, ask: What is your temperature, how was it measured, and when did it start?     Chills , has not checked temperature  7. CARDIAC HISTORY: Do you have any history of heart disease? (e.g., heart attack, congestive heart failure)      Hx heart hx  8. LUNG  HISTORY: Do you have any history of lung disease?  (e.g., pulmonary embolus, asthma, emphysema)     Hx asthma , pneumonia  9. PE RISK FACTORS: Do you have a history of blood clots? (or: recent major surgery, recent prolonged travel, bedridden)     na 10. OTHER SYMPTOMS: Do you have any other symptoms? (e.g., runny nose, wheezing, chest pain)       Runny nose clear drainage, wheezing all of the time laying down cough clear . Chest pain with coughing . SOB at times at rest. No chest pain now .  11. PREGNANCY: Is there any chance you are pregnant? When was your last menstrual period?       na 12. TRAVEL: Have you traveled out of the country in the last month? (e.g., travel history, exposures)       na  Protocols used: Cough - Acute Productive-A-AH

## 2024-04-04 NOTE — Telephone Encounter (Signed)
 Copied from CRM #[id number]. Topic: Clinical - Medical Advice >> Apr 04, 2024 12:25 PM Delon DASEN wrote: Reason for CRM: calling back for [facility] to give bp readings- 90/93-  [phone]

## 2024-04-04 NOTE — Telephone Encounter (Signed)
 BP elevated. Needs to take medication and avoid any decongestant OTC. If continues to be elevated needs to be seen.

## 2024-04-04 NOTE — Telephone Encounter (Signed)
 I spoke to pt and her BP reading was 190/92 earlier and asked if she could taker her BP again and pt states she can't as she is on her way to the pharmacy.

## 2024-04-04 NOTE — Telephone Encounter (Signed)
 Apt scheduled.

## 2024-04-04 NOTE — Progress Notes (Signed)
 "  Subjective:    Patient ID: [name], female    DOB: 1945/09/26, 78 y.o.   MRN: [id number]  Chief Complaint  Patient presents with   Cough   PT presents to the office today with cough that started 3 days ago. Feels worse.  Cough This is a new problem. The current episode started in the past 7 days. The problem has been gradually worsening. The problem occurs every few minutes. The cough is Non-productive. Associated symptoms include chills, ear congestion, ear pain, a fever, headaches, myalgias, nasal congestion, postnasal drip, a sore throat, shortness of breath and wheezing. She has tried rest and OTC cough suppressant for the symptoms. The treatment provided mild relief.      Review of Systems  Constitutional:  Positive for chills and fever.  HENT:  Positive for ear pain, postnasal drip and sore throat.   Respiratory:  Positive for cough, shortness of breath and wheezing.   Musculoskeletal:  Positive for myalgias.  Neurological:  Positive for headaches.  All other systems reviewed and are negative.   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 06/19/21-Currently lives with daughter and is actively looking for a rental home.    1 son and 1 daughter. Son died in Apr 17, 2016.   2 grandchildren   Left handedNo caffiene   Social Drivers of Health   Tobacco Use: Low  Risk (04/04/2024)   Patient History    Smoking Tobacco Use: Never    Smokeless Tobacco Use: Never    Passive Exposure: Not on file  Financial Resource Strain: Low Risk (01/08/2024)   Overall Financial Resource Strain (CARDIA)    Difficulty of Paying Living Expenses: Not hard at all  Food Insecurity: No Food Insecurity (01/08/2024)   Epic    Worried About Programme Researcher, Broadcasting/film/video in the Last Year: Never true    Ran Out of Food in the Last Year: Never true  Transportation Needs: No Transportation Needs (01/08/2024)   Epic    Lack of Transportation (Medical): No    Lack of Transportation (Non-Medical): No  Physical Activity: Insufficiently Active (01/08/2024)   Exercise Vital Sign    Days of Exercise per Week: 3 days    Minutes of Exercise per Session: 30 min  Stress: Stress Concern Present (01/08/2024)   [employer] of Occupational Health - Occupational Stress Questionnaire    Feeling of Stress: Very much  Social Connections: Unknown (01/08/2024)   Social Connection and Isolation Panel    Frequency of Communication with Friends and Family: More than three times a week    Frequency of Social Gatherings with Friends and Family: More than three times a week    Attends Religious Services: More than 4 times per year    Active Member of [employer] or Organizations:  Yes    Attends Club or Organization Meetings: More than 4 times per year    Marital Status: Patient declined  Depression (PHQ2-9): Low Risk (01/08/2024)   Depression (PHQ2-9)    PHQ-2 Score: 0  Recent Concern: Depression (PHQ2-9) - Medium Risk (12/31/2023)   Depression (PHQ2-9)    PHQ-2 Score: 6  Alcohol  Screen: Low Risk (01/08/2024)   Alcohol  Screen    Last Alcohol  Screening Score (AUDIT): 0  Housing: Low Risk (01/08/2024)   Epic    Unable to Pay for Housing in the Last Year: No    Number of Times Moved in the Last Year: 0    Homeless in the Last Year: No  Utilities: Not At Risk (01/08/2024)   Epic    Threatened with loss of  utilities: No  Health Literacy: Adequate Health Literacy (01/08/2024)   B1300 Health Literacy    Frequency of need for help with medical instructions: Never   Family History  Problem Relation Age of Onset   Hypertension Mother    Arthritis Mother    CAD Mother 37   Hip fracture Mother 72   COPD Father    Crohn's disease Sister    Cancer Sister        lung   Early death Brother 23       house fire   Diabetes Maternal Grandmother    Hypertension Sister    Hypertension Brother    Colon cancer Neg Hx         Objective:   Physical Exam Vitals reviewed.  Constitutional:      General: She is not in acute distress.    Appearance: She is well-developed.  HENT:     Head: Normocephalic and atraumatic.  Eyes:     Pupils: Pupils are equal, round, and reactive to light.  Neck:     Thyroid : No thyromegaly.  Cardiovascular:     Rate and Rhythm: Normal rate and regular rhythm.     Heart sounds: Normal heart sounds. No murmur heard. Pulmonary:     Effort: Pulmonary effort is normal. No respiratory distress.     Breath sounds: Wheezing and rhonchi present.  Abdominal:     General: Bowel sounds are normal. There is no distension.     Palpations: Abdomen is soft.     Tenderness: There is no abdominal tenderness.  Musculoskeletal:        General: No tenderness. Normal range of motion.     Cervical back: Normal range of motion and neck supple.  Skin:    General: Skin is warm and dry.  Neurological:     Mental Status: She is alert and oriented to person, place, and time.     Cranial Nerves: No cranial nerve deficit.     Deep Tendon Reflexes: Reflexes are normal and symmetric.  Psychiatric:        Behavior: Behavior normal.        Thought Content: Thought content normal.        Judgment: Judgment normal.       BP (!) 200/108   Pulse (!) 101   Temp 98.9 F (37.2 C) (Oral)   Ht 5' 4 (1.626 m)   Wt 157 lb 3.2 oz (71.3 kg)   LMP 01/22/1981   SpO2 93%   BMI 26.98 kg/m       Assessment & Plan:  [name] comes in today with chief complaint of Cough   Diagnosis and orders addressed:  1. SOB (shortness of  breath) - Veritor SARS-CoV-2 and Flu A+B  2. Acute bacterial bronchitis (Primary) - Take meds as prescribed - Use a cool mist humidifier  -Use saline nose sprays frequently -Force fluids -For any cough or congestion  Use plain Mucinex- regular strength or max strength is fine -For fever or aces or pains- take tylenol  or ibuprofen. -Throat lozenges if help -Strict precautions to go to ED or be seen if symptoms worsen or do not improve  - predniSONE  (STERAPRED UNI-PAK 21 TAB) 10 MG (21) TBPK tablet; Use as directed  Dispense: 21 tablet; Refill: 0 - doxycycline  (ADOXA) 100 MG tablet; Take 1 tablet (100 mg total) by mouth 2 (two) times daily.  Dispense: 20 tablet; Refill: 0 - albuterol  (VENTOLIN  HFA) 108 (90 Base) MCG/ACT inhaler; Inhale 2 puffs into the lungs every 6 (six) hours as needed for wheezing or shortness of breath.  Dispense: 18 g; Refill: 0 - albuterol  (PROVENTIL ) (2.5 MG/3ML) 0.083% nebulizer solution; Take 3 mLs (2.5 mg total) by nebulization every 6 (six) hours as needed for wheezing or shortness of breath.  Dispense: 150 mL; Refill: 1 - benzonatate  (TESSALON ) 200 MG capsule; Take 1 capsule (200 mg total) by mouth 2 (two) times daily as needed for cough.  Dispense: 20 capsule; Refill: 0   3. Elevated blood pressure reading Pt will go home and take medication and recheck BP one hour later. If still elevated will need to go to ED   4. Essential hypertension -Dash diet information given -Exercise encouraged - Stress Management  -Continue current meds     Bari Learn, FNP   "

## 2024-04-08 ENCOUNTER — Encounter (HOSPITAL_COMMUNITY): Payer: Self-pay | Admitting: *Deleted

## 2024-04-08 DIAGNOSIS — Z955 Presence of coronary angioplasty implant and graft: Secondary | ICD-10-CM

## 2024-04-08 NOTE — Progress Notes (Signed)
 Cardiac Individual Treatment Plan  Patient Details  Name: [name] MRN: [id number] Date of Birth: 1946-03-14 Referring Provider:   Flowsheet Row CARDIAC REHAB PHASE II ORIENTATION from 10/31/2023 in [facility] CARDIAC REHABILITATION  Referring Provider Lavona Agent MD    Initial Encounter Date:  Flowsheet Row CARDIAC REHAB PHASE II ORIENTATION from 10/31/2023 in [facility] CARDIAC REHABILITATION  Date 10/31/23    Visit Diagnosis: Status post coronary artery stent placement  Patient's Home Medications on Admission: Current Medications[1]  Past Medical History: Past Medical History:  Diagnosis Date   Asthma    Chronic systolic heart failure (HCC)    Echo (08/26/13):  Mild LVH. EF 25% to 30%. Diffuse HK. Aortic valve: There was trivial regurgitation. Mitral valve: There was mild regurgitation. Left atrium: The atrium was mildly dilated.   GERD (gastroesophageal reflux disease)    Headache(784.0)    History of kidney stones    History of nephrolithiasis    Hypertension    NICM (nonischemic cardiomyopathy) (HCC)    a. LHC (08/2013):  no CAD   Sleep apnea    does not use machine   Stroke (HCC)    Type II or unspecified type diabetes mellitus without mention of complication, uncontrolled    borderline    Tobacco Use: Tobacco Use History[2]  Labs: Review Flowsheet  More data exists      Latest Ref Rng & Units 05/05/2021 08/08/2021 10/25/2021 11/16/2022 12/31/2023  Labs for ITP Cardiac and Pulmonary Rehab  Cholestrol 100 - 199 mg/dL 791  - - 786  833   LDL (calc) 0 - 99 mg/dL 874  - - 861  95   HDL-C >39 mg/dL 40  - - 44  42   Trlycerides 0 - 149 mg/dL 759  - - 824  831   Hemoglobin A1c 4.8 - 5.6 % 7.1  6.9  7.1  7.5  6.8     Capillary Blood Glucose: Lab Results  Component Value Date   GLUCAP 302 (H) 10/04/2023   GLUCAP 148 (H) 10/04/2023   GLUCAP 135 (H) 10/27/2021   GLUCAP 135 (H) 10/06/2021   GLUCAP 140 (H) 10/06/2021     Exercise Target Goals: Exercise  Program Goal: Individual exercise prescription set using results from initial 6 min walk test and THRR while considering  patients activity barriers and safety.   Exercise Prescription Goal: Starting with aerobic activity 30 plus minutes a day, 3 days per week for initial exercise prescription. Provide home exercise prescription and guidelines that participant acknowledges understanding prior to discharge.  Activity Barriers & Risk Stratification:  Activity Barriers & Cardiac Risk Stratification - 10/31/23 1414       Activity Barriers & Cardiac Risk Stratification   Activity Barriers History of Falls;Balance Concerns;Muscular Weakness;Deconditioning    Cardiac Risk Stratification Moderate          6 Minute Walk:  6 Minute Walk     Row Name 10/31/23 1507         6 Minute Walk   Phase Initial     Distance 895 feet     Walk Time 6 minutes     # of Rest Breaks 0     MPH 1.69     METS 1.38     RPE 11     Perceived Dyspnea  3     VO2 Peak 4.84     Symptoms Yes (comment)     Comments SOB, wearing flip flops     Resting HR 60  bpm     Resting BP 122/66     Resting Oxygen Saturation  98 %     Exercise Oxygen Saturation  during 6 min walk 99 %     Max Ex. HR 92 bpm     Max Ex. BP 126/74     2 Minute Post BP 106/70        Oxygen Initial Assessment:   Oxygen Re-Evaluation:   Oxygen Discharge (Final Oxygen Re-Evaluation):   Initial Exercise Prescription:  Initial Exercise Prescription - 10/31/23 1500       Date of Initial Exercise RX and Referring Provider   Date 10/31/23    Referring Provider Lavona Agent MD      Oxygen   Maintain Oxygen Saturation 88% or higher      Treadmill   MPH 1.5    Grade 0.5    Minutes 15    METs 2.25      NuStep   Level 1    SPM 80    Minutes 15    METs 2.2      Prescription Details   Frequency (times per week) 2    Duration Progress to 30 minutes of continuous aerobic without signs/symptoms of physical distress       Intensity   THRR 40-80% of Max Heartrate 93-126    Ratings of Perceived Exertion 11-13    Perceived Dyspnea 0-4      Progression   Progression Continue to progress workloads to maintain intensity without signs/symptoms of physical distress.      Resistance Training   Training Prescription Yes    Weight 3 lb    Reps 10-15          Perform Capillary Blood Glucose checks as needed.  Exercise Prescription Changes:   Exercise Prescription Changes     Row Name 10/31/23 1500 11/08/23 1500 12/04/23 1500 01/03/24 1500 01/22/24 1500     Response to Exercise   Blood Pressure (Admit) 122/60 140/70 120/64 110/60 150/78   Blood Pressure (Exercise) 126/74 122/62 136/82 -- --   Blood Pressure (Exit) 106/70 148/60 110/70 118/68 128/64   Heart Rate (Admit) 60 bpm 55 bpm 75 bpm 91 bpm 66 bpm   Heart Rate (Exercise) 92 bpm 94 bpm 117 bpm 101 bpm 103 bpm   Heart Rate (Exit) 70 bpm 51 bpm 90 bpm 72 bpm 69 bpm   Oxygen Saturation (Admit) 98 % -- -- -- --   Oxygen Saturation (Exercise) 99 % -- -- -- --   Rating of Perceived Exertion (Exercise) 11 12 15 12 12    Perceived Dyspnea (Exercise) 3 -- -- -- --   Symptoms SOB -- -- -- --   Comments walk test results (in flip flops) -- -- -- --   Duration -- Continue with 30 min of aerobic exercise without signs/symptoms of physical distress. Continue with 30 min of aerobic exercise without signs/symptoms of physical distress. Continue with 30 min of aerobic exercise without signs/symptoms of physical distress. Continue with 30 min of aerobic exercise without signs/symptoms of physical distress.   Intensity -- THRR unchanged THRR unchanged THRR unchanged THRR unchanged     Progression   Progression -- Continue to follow PAD protocol Continue to progress workloads to maintain intensity without signs/symptoms of physical distress. Continue to progress workloads to maintain intensity without signs/symptoms of physical distress. Continue to progress workloads to  maintain intensity without signs/symptoms of physical distress.     Resistance Training   Training Prescription -- Yes Yes  Yes Yes   Weight -- 3 3 3 3    Reps -- 10-15 10-15 10-15 10-15     Treadmill   MPH -- 1 1.2 1.4 1.5   Grade -- 0 0 0 0   Minutes -- 15 15 15 15    METs -- 1.77 1.92 2.07 2.15     NuStep   Level -- 1 2 2 1    SPM -- -- 38 66 55   Minutes -- 15 15 15 15    METs -- 1.4 1.8 1.6 1.6    Row Name 02/26/24 1500             Response to Exercise   Blood Pressure (Admit) 110/60       Blood Pressure (Exit) 108/60       Heart Rate (Admit) 90 bpm       Heart Rate (Exercise) 100 bpm       Heart Rate (Exit) 54 bpm       Rating of Perceived Exertion (Exercise) 14       Duration Continue with 30 min of aerobic exercise without signs/symptoms of physical distress.       Intensity THRR unchanged         Progression   Progression Continue to progress workloads to maintain intensity without signs/symptoms of physical distress.         Resistance Training   Training Prescription Yes       Weight 3       Reps 10-15         Treadmill   MPH 1.2       Grade 0       Minutes 15       METs 1.92         NuStep   Level 1       SPM 65       Minutes 15       METs 1.6          Exercise Comments:   Exercise Comments     Row Name 11/06/23 1337           Exercise Comments First full day of exercise!  Patient was oriented to gym and equipment including functions, settings, policies, and procedures.  Patient's individual exercise prescription and treatment plan were reviewed.  All starting workloads were established based on the results of the 6 minute walk test done at initial orientation visit.  The plan for exercise progression was also introduced and progression will be customized based on patient's performance and goals.          Exercise Goals and Review:   Exercise Goals     Row Name 10/31/23 1510             Exercise Goals   Increase Physical Activity  Yes       Intervention Provide advice, education, support and counseling about physical activity/exercise needs.;Develop an individualized exercise prescription for aerobic and resistive training based on initial evaluation findings, risk stratification, comorbidities and participant's personal goals.       Expected Outcomes Short Term: Attend rehab on a regular basis to increase amount of physical activity.;Long Term: Exercising regularly at least 3-5 days a week.;Long Term: Add in home exercise to make exercise part of routine and to increase amount of physical activity.       Increase Strength and Stamina Yes       Intervention Provide advice, education, support and counseling about physical activity/exercise needs.;Develop an individualized  exercise prescription for aerobic and resistive training based on initial evaluation findings, risk stratification, comorbidities and participant's personal goals.       Expected Outcomes Short Term: Increase workloads from initial exercise prescription for resistance, speed, and METs.;Short Term: Perform resistance training exercises routinely during rehab and add in resistance training at home;Long Term: Improve cardiorespiratory fitness, muscular endurance and strength as measured by increased METs and functional capacity ( )       Able to understand and use rate of perceived exertion (RPE) scale Yes       Intervention Provide education and explanation on how to use RPE scale       Expected Outcomes Short Term: Able to use RPE daily in rehab to express subjective intensity level;Long Term:  Able to use RPE to guide intensity level when exercising independently       Able to understand and use Dyspnea scale Yes       Intervention Provide education and explanation on how to use Dyspnea scale       Expected Outcomes Short Term: Able to use Dyspnea scale daily in rehab to express subjective sense of shortness of breath during exertion;Long Term: Able to use  Dyspnea scale to guide intensity level when exercising independently       Knowledge and understanding of Target Heart Rate Range (THRR) Yes       Intervention Provide education and explanation of THRR including how the numbers were predicted and where they are located for reference       Expected Outcomes Short Term: Able to state/look up THRR;Long Term: Able to use THRR to govern intensity when exercising independently;Short Term: Able to use daily as guideline for intensity in rehab       Able to check pulse independently Yes       Intervention Provide education and demonstration on how to check pulse in carotid and radial arteries.;Review the importance of being able to check your own pulse for safety during independent exercise       Expected Outcomes Short Term: Able to explain why pulse checking is important during independent exercise;Long Term: Able to check pulse independently and accurately       Understanding of Exercise Prescription Yes       Intervention Provide education, explanation, and written materials on patient's individual exercise prescription       Expected Outcomes Short Term: Able to explain program exercise prescription;Long Term: Able to explain home exercise prescription to exercise independently          Exercise Goals Re-Evaluation :  Exercise Goals Re-Evaluation     Row Name 12/11/23 1443 01/08/24 1521 02/26/24 1528         Exercise Goal Re-Evaluation   Exercise Goals Review Increase Physical Activity;Increase Strength and Stamina;Able to understand and use rate of perceived exertion (RPE) scale;Knowledge and understanding of Target Heart Rate Range (THRR);Understanding of Exercise Prescription;Able to check pulse independently Increase Physical Activity;Increase Strength and Stamina;Able to understand and use Dyspnea scale;Able to check pulse independently;Understanding of Exercise Prescription;Knowledge and understanding of Target Heart Rate Range (THRR);Able to  understand and use rate of perceived exertion (RPE) scale Increase Physical Activity;Increase Strength and Stamina;Understanding of Exercise Prescription     Comments Paz is doing great in rehab. She does notes she is not excercising at home at all. I asked if there was anywhere she could walk at home and she noted that [place] road that she could walk. Allien is doing good  in rehab. She notes she is still not exercising at home, she is still in the process of moving so she says she is lifting boxes. Mentioned again to her about trying to at least walk everyday for 15-30 minutes. [name] is doing well in rehab. She does not exericse much at home. She has been tryign to attend rehab on time but is sometime late. She is increasing walking speed in class     Expected Outcomes Short: continue to attend rehab. Long: Walking everyday for at least 30 minutes. Short: continue to attend rehab. Long: Walking everyday for at least 30 minutes. Short: continue to attend rehab. Long: Walking everyday for at least 30 minutes.         Discharge Exercise Prescription (Final Exercise Prescription Changes):  Exercise Prescription Changes - 02/26/24 1500       Response to Exercise   Blood Pressure (Admit) 110/60    Blood Pressure (Exit) 108/60    Heart Rate (Admit) 90 bpm    Heart Rate (Exercise) 100 bpm    Heart Rate (Exit) 54 bpm    Rating of Perceived Exertion (Exercise) 14    Duration Continue with 30 min of aerobic exercise without signs/symptoms of physical distress.    Intensity THRR unchanged      Progression   Progression Continue to progress workloads to maintain intensity without signs/symptoms of physical distress.      Resistance Training   Training Prescription Yes    Weight 3    Reps 10-15      Treadmill   MPH 1.2    Grade 0    Minutes 15    METs 1.92      NuStep   Level 1    SPM 65    Minutes 15    METs 1.6          Nutrition:  Target Goals: Understanding of  nutrition guidelines, daily intake of sodium 1500mg , cholesterol 200mg , calories 30% from fat and 7% or less from saturated fats, daily to have 5 or more servings of fruits and vegetables.  Biometrics:  Pre Biometrics - 10/31/23 1510       Pre Biometrics   Height 5' 0.5 (1.537 m)    Weight 158 lb (71.7 kg)    Waist Circumference 35.25 inches    Hip Circumference 41.25 inches    Waist to Hip Ratio 0.85 %    BMI (Calculated) 30.34    Grip Strength 9.1 kg    Single Leg Stand 4 seconds           Nutrition Therapy Plan and Nutrition Goals:  Nutrition Therapy & Goals - 10/31/23 1515       Intervention Plan   Intervention Prescribe, educate and counsel regarding individualized specific dietary modifications aiming towards targeted core components such as weight, hypertension, lipid management, diabetes, heart failure and other comorbidities.;Nutrition handout(s) given to patient.    Expected Outcomes Short Term Goal: Understand basic principles of dietary content, such as calories, fat, sodium, cholesterol and nutrients.;Long Term Goal: Adherence to prescribed nutrition plan.          Nutrition Assessments:  MEDIFICTS Score Key: >=70 Need to make dietary changes  40-70 Heart Healthy Diet <= 40 Therapeutic Level Cholesterol Diet  Flowsheet Row CARDIAC REHAB PHASE II ORIENTATION from 10/31/2023 in [facility] CARDIAC REHABILITATION  Picture Your Plate Total Score on Admission 60   Picture Your Plate Scores: <59 Unhealthy dietary pattern with much room for improvement. 41-50 Dietary pattern  unlikely to meet recommendations for good health and room for improvement. 51-60 More healthful dietary pattern, with some room for improvement.  >60 Healthy dietary pattern, although there may be some specific behaviors that could be improved.    Nutrition Goals Re-Evaluation:  Nutrition Goals Re-Evaluation     Row Name 12/11/23 1435 01/08/24 1514 02/26/24 1531         Goals    Nutrition Goal -- -- healthy eating     Comment Jatavia notes she is eating pretty good. She says she is watching her salt and sugar intake due to her high b/p and diabeties. Kinesha still is trying to watch her salt and sugar intake. She notes salt has been harder to cut back than sugar. Shunna likes to add salt to her foods, spoke with her about trying to stop adding salt. Signe still is trying to watch her salt and sugar intake. She notes salt has been harder to cut back than sugar due to her being a salty person. She does not use the salt shaker much anymore. She loves sweet tea and we tallked about doing half and half tea to help cut back on some of the sugar.     Expected Outcome Short: continue to monitor salts and sugars in diet. Long: maintain healthy diet and eat well balanced meals. Short: To stop adding salt to foods. Long: maintain healthy diet and eat well balanced meals. Short: try half and half tea    long: continue to cut salt and eating healthy diet        Nutrition Goals Discharge (Final Nutrition Goals Re-Evaluation):  Nutrition Goals Re-Evaluation - 02/26/24 1531       Goals   Nutrition Goal healthy eating    Comment Rosabelle still is trying to watch her salt and sugar intake. She notes salt has been harder to cut back than sugar due to her being a salty person. She does not use the salt shaker much anymore. She loves sweet tea and we tallked about doing half and half tea to help cut back on some of the sugar.    Expected Outcome Short: try half and half tea    long: continue to cut salt and eating healthy diet          Psychosocial: Target Goals: Acknowledge presence or absence of significant depression and/or stress, maximize coping skills, provide positive support system. Participant is able to verbalize types and ability to use techniques and skills needed for reducing stress and depression.  Initial Review & Psychosocial Screening:  Initial Psych Review &  Screening - 10/31/23 1418       Initial Review   Current issues with History of Depression;Current Stress Concerns    Source of Stress Concerns Unable to participate in former interests or hobbies;Unable to perform yard/household activities;Financial    Comments In process of moving.  Was living with daughter in [place], moving to [place] to be closer to work      [employer]   Good Support System? Yes   Daughter, friends from church     Barriers   Psychosocial barriers to participate in program Psychosocial barriers identified (see note);The patient should benefit from training in stress management and relaxation.      Screening Interventions   Interventions Encouraged to exercise;To provide support and resources with identified psychosocial needs;Provide feedback about the scores to participant    Expected Outcomes Short Term goal: Utilizing psychosocial counselor, staff and physician to assist with identification of  specific Stressors or current issues interfering with healing process. Setting desired goal for each stressor or current issue identified.;Long Term Goal: Stressors or current issues are controlled or eliminated.;Short Term goal: Identification and review with participant of any Quality of Life or Depression concerns found by scoring the questionnaire.;Long Term goal: The participant improves quality of Life and PHQ9 Scores as seen by post scores and/or verbalization of changes          Quality of Life Scores:  Quality of Life - 10/31/23 1514       Quality of Life   Select Quality of Life      Quality of Life Scores   Health/Function Pre 22.17 %    Socioeconomic Pre 19 %    Psych/Spiritual Pre 22.71 %    Family Pre 25.8 %    GLOBAL Pre 22.07 %         Scores of 19 and below usually indicate a poorer quality of life in these areas.  A difference of  2-3 points is a clinically meaningful difference.  A difference of 2-3 points in the total score of the  Quality of Life Index has been associated with significant improvement in overall quality of life, self-image, physical symptoms, and general health in studies assessing change in quality of life.  PHQ-9: Review Flowsheet  More data exists      01/08/2024 01/03/2024 12/31/2023 11/27/2023 10/31/2023  Depression screen PHQ 2/9  Decreased Interest 0 0 0 1 1  Down, Depressed, Hopeless 0 0 0 1 1  PHQ - 2 Score 0 0 0 2 2  Altered sleeping 0 0 2 1 0  Tired, decreased energy 0 0 0 2 3  Change in appetite 0 0 0 0 0  Feeling bad or failure about yourself  0 0 2 1 1   Trouble concentrating 0 0 2 1 1   Moving slowly or fidgety/restless 0 0 0 0 0  Suicidal thoughts 0 0 0 0 0  PHQ-9 Score 0  0  6  7  7    Difficult doing work/chores Not difficult at all Not difficult at all Somewhat difficult Somewhat difficult Somewhat difficult    Details       Data saved with a previous flowsheet row definition        Interpretation of Total Score  Total Score Depression Severity:  1-4 = Minimal depression, 5-9 = Mild depression, 10-14 = Moderate depression, 15-19 = Moderately severe depression, 20-27 = Severe depression   Psychosocial Evaluation and Intervention:  Psychosocial Evaluation - 10/31/23 1511       Psychosocial Evaluation & Interventions   Comments Kanda is coming into cardiac rehab after having a stent placed in June.  She is also in the middle of moving from [place] to [place] to be closer to work.  She was living with her daughter (her biggest supporter) but wanted to move out to be independent and close to work since she goes in 4 days a week. 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  She is eager to build her strength and stamina back up. She wants to get into a routine for herself again.  She usually sleeps well.  She does not precieve any issues with getting to rehab reguarly.  She does have a copay and will let us  know if it gets to be too much. She has a history of  meds but is no longer needing them.  She is also off of  her type 2 DM meds which she is very proud of herself.    Expected Outcomes Short: Attend rehab to build up stamina. Long: Continue to improve strength    Continue Psychosocial Services  Follow up required by staff          Psychosocial Re-Evaluation:  Psychosocial Re-Evaluation     Row Name 12/11/23 1432 01/08/24 1507 02/26/24 1529         Psychosocial Re-Evaluation   Current issues with Current Sleep Concerns;Current Stress Concerns Current Stress Concerns;Current Sleep Concerns Current Stress Concerns;Current Sleep Concerns     Comments Sira notes she is in the middle of moving, she is moving out of her daughters house into a house to herself, so she is having some stress from that. She also notes she wakes up 2-3 times a night and she thinks it is her b/p meds causing this. Demetrice is still moving currently, but notes she is mostly finished. Her new home is smaller than her old home so she is adjusting to that, but her new home is closer to her work so that is nice for her. She notes her sleep is still rough waking up around 2 am most mornings and can't go back to sleep. Her doctor added another b/p med for her but she doesn't think it's helping. Roland is doing well in rehab. She has a lot of stress right now due to his sister not doing well. She stated that she hates to answer the phone now incrase that is someone telling her her sister passed. She has not been sleeping well either due to the stress asn worrying     Expected Outcomes Short: speak with her PCP about her b/p meds and her sleeping concerns. Long: continue to have healthy stress outlets. Short: speak with her PCP about her sleeping concerns. Long: continue to have healthy stress outlets. Short: find outlet to help with stress and sleep    long: continue to exericse for overall wellbeing     Interventions Relaxation education;Encouraged to attend Cardiac Rehabilitation  for the exercise;Stress management education Encouraged to attend Cardiac Rehabilitation for the exercise Encouraged to attend Cardiac Rehabilitation for the exercise     Continue Psychosocial Services  Follow up required by staff Follow up required by staff Follow up required by staff        Psychosocial Discharge (Final Psychosocial Re-Evaluation):  Psychosocial Re-Evaluation - 02/26/24 1529       Psychosocial Re-Evaluation   Current issues with Current Stress Concerns;Current Sleep Concerns    Comments Liesl is doing well in rehab. She has a lot of stress right now due to his sister not doing well. She stated that she hates to answer the phone now incrase that is someone telling her her sister passed. She has not been sleeping well either due to the stress asn worrying    Expected Outcomes Short: find outlet to help with stress and sleep    long: continue to exericse for overall wellbeing    Interventions Encouraged to attend Cardiac Rehabilitation for the exercise    Continue Psychosocial Services  Follow up required by staff          Vocational Rehabilitation: Provide vocational rehab assistance to qualifying candidates.   Vocational Rehab Evaluation & Intervention:  Vocational Rehab - 10/31/23 1415       Initial Vocational Rehab Evaluation & Intervention   Assessment shows need for Vocational Rehabilitation No   still working part time at [employer]  Education: Education Goals: Education classes will be provided on a weekly basis, covering required topics. Participant will state understanding/return demonstration of topics presented.  Learning Barriers/Preferences:  Learning Barriers/Preferences - 10/31/23 1416       Learning Barriers/Preferences   Learning Barriers None    Learning Preferences None          Education Topics: Hypertension, Hypertension Reduction -Define heart disease and high blood pressure. Discus how high blood pressure affects the body  and ways to reduce high blood pressure.   Exercise and Your Heart -Discuss why it is important to exercise, the FITT principles of exercise, normal and abnormal responses to exercise, and how to exercise safely.   Angina -Discuss definition of angina, causes of angina, treatment of angina, and how to decrease risk of having angina.   Cardiac Medications -Review what the following cardiac medications are used for, how they affect the body, and side effects that may occur when taking the medications.  Medications include Aspirin , Beta blockers, calcium  channel blockers, ACE Inhibitors, angiotensin receptor blockers, diuretics, digoxin, and antihyperlipidemics. Flowsheet Row CARDIAC REHAB PHASE II EXERCISE from 11/08/2023 in [place] [facility]  Date 11/08/23  Educator [facility]  Instruction Review Code 1- Verbalizes Understanding    Congestive Heart Failure -Discuss the definition of CHF, how to live with CHF, the signs and symptoms of CHF, and how keep track of weight and sodium intake.   Heart Disease and Intimacy -Discus the effect sexual activity has on the heart, how changes occur during intimacy as we age, and safety during sexual activity.   Smoking Cessation / COPD -Discuss different methods to quit smoking, the health benefits of quitting smoking, and the definition of COPD.   Nutrition I: Fats -Discuss the types of cholesterol, what cholesterol does to the heart, and how cholesterol levels can be controlled.   Nutrition II: Labels -Discuss the different components of food labels and how to read food label   Heart Parts/Heart Disease and PAD -Discuss the anatomy of the heart, the pathway of blood circulation through the heart, and these are affected by heart disease.   Stress I: Signs and Symptoms -Discuss the causes of stress, how stress may lead to anxiety and depression, and ways to limit stress.   Stress II: Relaxation -Discuss different types of  relaxation techniques to limit stress.   Warning Signs of Stroke / TIA -Discuss definition of a stroke, what the signs and symptoms are of a stroke, and how to identify when someone is having stroke.   Knowledge Questionnaire Score:  Knowledge Questionnaire Score - 10/31/23 1515       Knowledge Questionnaire Score   Pre Score 22/26          Core Components/Risk Factors/Patient Goals at Admission:  Personal Goals and Risk Factors at Admission - 10/31/23 1515       Core Components/Risk Factors/Patient Goals on Admission    Weight Management Yes;Weight Loss;Obesity    Intervention Weight Management: Develop a combined nutrition and exercise program designed to reach desired caloric intake, while maintaining appropriate intake of nutrient and fiber, sodium and fats, and appropriate energy expenditure required for the weight goal.;Weight Management: Provide education and appropriate resources to help participant work on and attain dietary goals.;Weight Management/Obesity: Establish reasonable short term and long term weight goals.;Obesity: Provide education and appropriate resources to help participant work on and attain dietary goals.    Admit Weight 158 lb (71.7 kg)    Goal Weight: Short Term 155 lb (  70.3 kg)    Goal Weight: Long Term 150 lb (68 kg)    Expected Outcomes Short Term: Continue to assess and modify interventions until short term weight is achieved;Long Term: Adherence to nutrition and physical activity/exercise program aimed toward attainment of established weight goal;Weight Loss: Understanding of general recommendations for a balanced deficit meal plan, which promotes 1-2 lb weight loss per week and includes a negative energy balance of [phone] kcal/d;Understanding recommendations for meals to include 15-35% energy as protein, 25-35% energy from fat, 35-60% energy from carbohydrates, less than 200mg  of dietary cholesterol, 20-35 gm of total fiber daily;Understanding of  distribution of calorie intake throughout the day with the consumption of 4-5 meals/snacks    Improve shortness of breath with ADL's Yes    Intervention Provide education, individualized exercise plan and daily activity instruction to help decrease symptoms of SOB with activities of daily living.    Expected Outcomes Short Term: Improve cardiorespiratory fitness to achieve a reduction of symptoms when performing ADLs;Long Term: Be able to perform more ADLs without symptoms or delay the onset of symptoms    Diabetes Yes   no longer on meds or checking sugars   Intervention Provide education about signs/symptoms and action to take for hypo/hyperglycemia.;Provide education about proper nutrition, including hydration, and aerobic/resistive exercise prescription along with prescribed medications to achieve blood glucose in normal ranges: Fasting glucose 65-99 mg/dL    Expected Outcomes Short Term: Participant verbalizes understanding of the signs/symptoms and immediate care of hyper/hypoglycemia, proper foot care and importance of medication, aerobic/resistive exercise and nutrition plan for blood glucose control.    Heart Failure Yes    Intervention Provide a combined exercise and nutrition program that is supplemented with education, support and counseling about heart failure. Directed toward relieving symptoms such as shortness of breath, decreased exercise tolerance, and extremity edema.    Expected Outcomes Improve functional capacity of life;Short term: Attendance in program 2-3 days a week with increased exercise capacity. Reported lower sodium intake. Reported increased fruit and vegetable intake. Reports medication compliance.;Short term: Daily weights obtained and reported for increase. Utilizing diuretic protocols set by physician.;Long term: Adoption of self-care skills and reduction of barriers for early signs and symptoms recognition and intervention leading to self-care maintenance.     Hypertension Yes    Intervention Provide education on lifestyle modifcations including regular physical activity/exercise, weight management, moderate sodium restriction and increased consumption of fresh fruit, vegetables, and low fat dairy, alcohol  moderation, and smoking cessation.;Monitor prescription use compliance.    Expected Outcomes Short Term: Continued assessment and intervention until BP is < 140/39mm HG in hypertensive participants. < 130/81mm HG in hypertensive participants with diabetes, heart failure or chronic kidney disease.;Long Term: Maintenance of blood pressure at goal levels.    Lipids Yes    Intervention Provide education and support for participant on nutrition & aerobic/resistive exercise along with prescribed medications to achieve LDL 70mg , HDL >40mg .    Expected Outcomes Short Term: Participant states understanding of desired cholesterol values and is compliant with medications prescribed. Participant is following exercise prescription and nutrition guidelines.;Long Term: Cholesterol controlled with medications as prescribed, with individualized exercise RX and with personalized nutrition plan. Value goals: LDL < 70mg , HDL > 40 mg.          Core Components/Risk Factors/Patient Goals Review:   Goals and Risk Factor Review     Row Name 12/11/23 1438 01/08/24 1517 02/26/24 1534         Core Components/Risk Factors/Patient Goals Review  Personal Goals Review Hypertension;Diabetes Improve shortness of breath with ADL's;Develop more efficient breathing techniques such as purse lipped breathing and diaphragmatic breathing and practicing self-pacing with activity.;Hypertension;Diabetes Improve shortness of breath with ADL's;Develop more efficient breathing techniques such as purse lipped breathing and diaphragmatic breathing and practicing self-pacing with activity.;Hypertension;Diabetes     Review Tam notes she is on 3 different b/p meds. Also notes she has type 2  diabeties and she is on no medication for,she also does not check her sugar at home. I mentioned to her to speak with her PCP about checking her A1C. Taitum's PCP added Fargixa to her meds for her diabetes, she is still not checking her blood sugar at home, but plans to start, mentioned to her it is important to know her blood glucose levels. She also notes she is not checking her b/p at home much either, mentioned to her that was alos just as important especially since she is on 3 different b/p meds. Momoko is taking her medications as percribesd. She is dealing with her sister not dooing well and this has been very stressful for her,     Expected Outcomes Short: communicate with PCP about checking her A1C. Long: continue to follow up with her PCP and follow medication prescriptions. Short: start checking her b/p and blood glucose daily at home. Long: continue to follow up with her PCP and follow medication prescriptions. Short: start checking her b/p and blood glucose daily at home. Long: continue to follow up with her PCP and follow medication prescriptions.        Core Components/Risk Factors/Patient Goals at Discharge (Final Review):   Goals and Risk Factor Review - 02/26/24 1534       Core Components/Risk Factors/Patient Goals Review   Personal Goals Review Improve shortness of breath with ADL's;Develop more efficient breathing techniques such as purse lipped breathing and diaphragmatic breathing and practicing self-pacing with activity.;Hypertension;Diabetes    Review Rheba is taking her medications as percribesd. She is dealing with her sister not dooing well and this has been very stressful for her,    Expected Outcomes Short: start checking her b/p and blood glucose daily at home. Long: continue to follow up with her PCP and follow medication prescriptions.          ITP Comments:  ITP Comments     Row Name 10/31/23 1503 11/06/23 1337 11/21/23 1248 12/19/23 1519 01/16/24 1817   ITP  Comments Patient attend orientation today.  Patient is attending Cardiac Rehabilitation Program.  Documentation for diagnosis can be found in 10/05/23.  Reviewed medical chart, RPE/RPD, gym safety, and program guidelines.  Patient was fitted to equipment they will be using during rehab.  Patient is scheduled to start exercise on Tuesday 11/06/23 at 1330.   Initial ITP created and sent for review and signature by Dr. Dorn Ross, Medical Director for Cardiac Rehabilitation Program. First full day of exercise!  Patient was oriented to gym and equipment including functions, settings, policies, and procedures.  Patient's individual exercise prescription and treatment plan were reviewed.  All starting workloads were established based on the results of the 6 minute walk test done at initial orientation visit.  The plan for exercise progression was also introduced and progression will be customized based on patient's performance and goals. 30 day review completed. ITP sent to Dr. Dorn Ross, Medical Director of Cardiac Rehab. Continue with ITP unless changes are made by physician.  Newer to program. 30 day review completed. ITP sent to Dr. Dorn  Branch, Wellsite Geologist of Cardiac Rehab. Continue with ITP unless changes are made by physician. 30 day review completed. ITP sent to Dr. Dorn Ross, Medical Director of Cardiac Rehab. Continue with ITP unless changes are made by physician.    Row Name 02/13/24 0925 03/11/24 1134 04/08/24 1650       ITP Comments 30 day review completed. ITP sent to Dr. Dorn Ross, Medical Director of Cardiac Rehab. Continue with ITP unless changes are made by physician. 30 day review completed. ITP sent to Dr. Dorn Ross, Medical Director of Cardiac Rehab. Continue with ITP unless changes are made by physician. 30 day review completed. ITP sent to Dr. Dorn Ross, Medical Director of Cardiac Rehab. Continue with ITP unless changes are made by physician.  She has  not attended since 02/26/24, letter sent for no return contact.        Comments: 30 day review     [1]  Current Outpatient Medications:    acetaminophen  (TYLENOL ) 500 MG tablet, Take 1,000 mg by mouth every 6 (six) hours as needed for moderate pain or headache., Disp: , Rfl:    albuterol  (PROVENTIL ) (2.5 MG/3ML) 0.083% nebulizer solution, Take 3 mLs (2.5 mg total) by nebulization every 6 (six) hours as needed for wheezing or shortness of breath., Disp: 150 mL, Rfl: 1   albuterol  (VENTOLIN  HFA) 108 (90 Base) MCG/ACT inhaler, Inhale 2 puffs into the lungs every 6 (six) hours as needed for wheezing or shortness of breath., Disp: 18 g, Rfl: 0   alendronate  (FOSAMAX ) 70 MG tablet, Take 1 tablet (70 mg total) by mouth every 7 (seven) days. Take with a full glass of water  on an empty stomach., Disp: 12 tablet, Rfl: 3   amLODipine  (NORVASC ) 10 MG tablet, Take 1 tablet (10 mg total) by mouth daily., Disp: 90 tablet, Rfl: 2   apixaban  (ELIQUIS ) 5 MG TABS tablet, Take 1 tablet (5 mg total) by mouth 2 (two) times daily., Disp: 60 tablet, Rfl: 5   benzonatate  (TESSALON ) 200 MG capsule, Take 1 capsule (200 mg total) by mouth 2 (two) times daily as needed for cough., Disp: 20 capsule, Rfl: 0   bisoprolol  (ZEBETA ) 5 MG tablet, Take 1 tablet (5 mg total) by mouth daily., Disp: 90 tablet, Rfl: 3   clopidogrel  (PLAVIX ) 75 MG tablet, Take 1 tablet (75 mg total) by mouth daily with breakfast., Disp: 30 tablet, Rfl: 5   dapagliflozin  propanediol (FARXIGA ) 10 MG TABS tablet, Take 1 tablet (10 mg total) by mouth daily. (Patient not taking: Reported on 04/04/2024), Disp: 90 tablet, Rfl: 4   doxycycline  (ADOXA) 100 MG tablet, Take 1 tablet (100 mg total) by mouth 2 (two) times daily., Disp: 20 tablet, Rfl: 0   erythromycin  ophthalmic ointment, Place 1 Application into the right eye at bedtime., Disp: 3.5 g, Rfl: 0   furosemide  (LASIX ) 20 MG tablet, Take 1 tablet (20 mg total) by mouth daily., Disp: 90 tablet, Rfl: 1    glucose blood (ONETOUCH VERIO) test strip, Use as instructed, Disp: 100 each, Rfl: 12   isosorbide  mononitrate (IMDUR ) 120 MG 24 hr tablet, Take 1 tablet (120 mg total) by mouth daily., Disp: 90 tablet, Rfl: 1   nitroGLYCERIN  (NITROSTAT ) 0.4 MG SL tablet, Place 1 tablet (0.4 mg total) under the tongue every 5 (five) minutes as needed for chest pain., Disp: 25 tablet, Rfl: 2   omeprazole  (PRILOSEC) 20 MG capsule, Take 1 capsule (20 mg total) by mouth daily., Disp: 90 capsule, Rfl: 1  ONETOUCH DELICA LANCETS FINE MISC, Use to check BG once daily, Disp: 100 each, Rfl: 2   predniSONE  (STERAPRED UNI-PAK 21 TAB) 10 MG (21) TBPK tablet, Use as directed, Disp: 21 tablet, Rfl: 0   rosuvastatin  (CRESTOR ) 40 MG tablet, Take 1 tablet (40 mg total) by mouth daily., Disp: 30 tablet, Rfl: 11   sacubitril -valsartan  (ENTRESTO ) 97-103 MG, Take 1 tablet by mouth 2 (two) times daily., Disp: 60 tablet, Rfl: 11   spironolactone  (ALDACTONE ) 25 MG tablet, Take 1 tablet (25 mg total) by mouth daily., Disp: 30 tablet, Rfl: 6   triamcinolone  ointment (KENALOG ) 0.5 %, Apply 1 Application topically 2 (two) times daily., Disp: 60 g, Rfl: 0 [2]  Social History Tobacco Use  Smoking Status Never  Smokeless Tobacco Never

## 2024-04-14 ENCOUNTER — Telehealth (HOSPITAL_COMMUNITY): Payer: Self-pay

## 2024-04-14 NOTE — Telephone Encounter (Signed)
 Called to check in on patient as she has not been to rehab since 02/26/24. Advised to call back with an update on if she is going to continue the program or not.

## 2024-04-15 ENCOUNTER — Ambulatory Visit (HOSPITAL_COMMUNITY)

## 2024-04-17 ENCOUNTER — Ambulatory Visit (HOSPITAL_COMMUNITY)

## 2024-04-17 ENCOUNTER — Encounter (HOSPITAL_COMMUNITY): Payer: Self-pay

## 2024-04-17 NOTE — Progress Notes (Addendum)
 Cardiac Individual Treatment Plan  Patient Details  Name: [name] MRN: [id number] Date of Birth: 07/17/1945 Referring Provider:   Flowsheet Row CARDIAC REHAB PHASE II ORIENTATION from 10/31/2023 in [facility] CARDIAC REHABILITATION  Referring Provider Lavona Agent MD    Initial Encounter Date:  Flowsheet Row CARDIAC REHAB PHASE II ORIENTATION from 10/31/2023 in [place] CARDIAC REHABILITATION  Date 10/31/23    Visit Diagnosis: No diagnosis found.  Patient's Home Medications on Admission: Current Medications[1]  Past Medical History: Past Medical History:  Diagnosis Date   Asthma    Chronic systolic heart failure (HCC)    Echo (08/26/13):  Mild LVH. EF 25% to 30%. Diffuse HK. Aortic valve: There was trivial regurgitation. Mitral valve: There was mild regurgitation. Left atrium: The atrium was mildly dilated.   GERD (gastroesophageal reflux disease)    Headache(784.0)    History of kidney stones    History of nephrolithiasis    Hypertension    NICM (nonischemic cardiomyopathy) (HCC)    a. LHC (08/2013):  no CAD   Sleep apnea    does not use machine   Stroke (HCC)    Type II or unspecified type diabetes mellitus without mention of complication, uncontrolled    borderline    Tobacco Use: Tobacco Use History[2]  Labs: Review Flowsheet  More data exists      Latest Ref Rng & Units 05/05/2021 08/08/2021 10/25/2021 11/16/2022 12/31/2023  Labs for ITP Cardiac and Pulmonary Rehab  Cholestrol 100 - 199 mg/dL 791  - - 786  833   LDL (calc) 0 - 99 mg/dL 874  - - 861  95   HDL-C >39 mg/dL 40  - - 44  42   Trlycerides 0 - 149 mg/dL 759  - - 824  831   Hemoglobin A1c 4.8 - 5.6 % 7.1  6.9  7.1  7.5  6.8      Exercise Target Goals: Exercise Program Goal: Individual exercise prescription set using results from initial 6 min walk test and THRR while considering  patients activity barriers and safety.   Exercise Prescription Goal: Initial exercise prescription builds to  30-45 minutes a day of aerobic activity, 2-3 days per week.  Home exercise guidelines will be given to patient during program as part of exercise prescription that the participant will acknowledge.   Education: Aerobic Exercise: - Group verbal and visual presentation on the components of exercise prescription. Introduces F.I.T.T principle from ACSM for exercise prescriptions.  Reviews F.I.T.T. principles of aerobic exercise including progression. Written material provided at class time.   Education: Resistance Exercise: - Group verbal and visual presentation on the components of exercise prescription. Introduces F.I.T.T principle from ACSM for exercise prescriptions  Reviews F.I.T.T. principles of resistance exercise including progression. Written material provided at class time. Flowsheet Row CARDIAC REHAB PHASE II EXERCISE from 02/21/2024 in [place] CARDIAC REH[facility]ILITATION  Date 12/13/23  Educator [facility]  Instruction Review Code 1- Verbalizes Understanding     Education: Exercise & Equipment Safety: - Individual verbal instruction and demonstration of equipment use and safety with use of the equipment.   Education: Exercise Physiology & General Exercise Guidelines: - Group verbal and written instruction with models to review the exercise physiology of the cardiovascular system and associated critical values. Provides general exercise guidelines with specific guidelines to those with heart or lung disease. Written material provided at class time. Flowsheet Row CARDIAC REHAB PHASE II EXERCISE from 02/21/2024 in [place] CARDIAC REHABILITATION  Date 11/22/23  Educator  hb  Instruction Review Code 2- Demonstrated Understanding    Education: Flexibility, Balance, Mind/Body Relaxation: - Group verbal and visual presentation with interactive activity on the components of exercise prescription. Introduces F.I.T.T principle from ACSM for exercise prescriptions. Reviews F.I.T.T. principles of  flexibility and balance exercise training including progression. Also discusses the mind body connection.  Reviews various relaxation techniques to help reduce and manage stress (i.e. Deep breathing, progressive muscle relaxation, and visualization). Balance handout provided to take home. Written material provided at class time. Flowsheet Row CARDIAC REHAB PHASE II EXERCISE from 02/21/2024 in [place] CARDIAC REHABILITATION  Date 12/13/23  Educator [facility]  Instruction Review Code 1- Verbalizes Understanding    Activity Barriers & Risk Stratification:   6 Minute Walk:   Oxygen Initial Assessment:   Oxygen Re-Evaluation:   Oxygen Discharge (Final Oxygen Re-Evaluation):   Initial Exercise Prescription:   Perform Capillary Blood Glucose checks as needed.  Exercise Prescription Changes:   Exercise Prescription Changes     Row Name 02/26/24 1500             Response to Exercise   Blood Pressure (Admit) 110/60       Blood Pressure (Exit) 108/60       Heart Rate (Admit) 90 bpm       Heart Rate (Exercise) 100 bpm       Heart Rate (Exit) 54 bpm       Rating of Perceived Exertion (Exercise) 14       Duration Continue with 30 min of aerobic exercise without signs/symptoms of physical distress.       Intensity THRR unchanged         Progression   Progression Continue to progress workloads to maintain intensity without signs/symptoms of physical distress.         Resistance Training   Training Prescription Yes       Weight 3       Reps 10-15         Treadmill   MPH 1.2       Grade 0       Minutes 15       METs 1.92         NuStep   Level 1       SPM 65       Minutes 15       METs 1.6          Exercise Comments:   Exercise Goals and Review:   Exercise Goals Re-Evaluation :  Exercise Goals Re-Evaluation     Row Name 02/26/24 1528             Exercise Goal Re-Evaluation   Exercise Goals Review Increase Physical Activity;Increase Strength and  Stamina;Understanding of Exercise Prescription       Comments [name] is doing well in rehab. She does not exericse much at home. She has been tryign to attend rehab on time but is sometime late. She is increasing walking speed in class       Expected Outcomes Short: continue to attend rehab. Long: Walking everyday for at least 30 minutes.          Discharge Exercise Prescription (Final Exercise Prescription Changes):  Exercise Prescription Changes - 02/26/24 1500       Response to Exercise   Blood Pressure (Admit) 110/60    Blood Pressure (Exit) 108/60    Heart Rate (Admit) 90 bpm    Heart Rate (Exercise) 100 bpm    Heart Rate (  Exit) 54 bpm    Rating of Perceived Exertion (Exercise) 14    Duration Continue with 30 min of aerobic exercise without signs/symptoms of physical distress.    Intensity THRR unchanged      Progression   Progression Continue to progress workloads to maintain intensity without signs/symptoms of physical distress.      Resistance Training   Training Prescription Yes    Weight 3    Reps 10-15      Treadmill   MPH 1.2    Grade 0    Minutes 15    METs 1.92      NuStep   Level 1    SPM 65    Minutes 15    METs 1.6          Nutrition:  Target Goals: Understanding of nutrition guidelines, daily intake of sodium 1500mg , cholesterol 200mg , calories 30% from fat and 7% or less from saturated fats, daily to have 5 or more servings of fruits and vegetables.  Education: Nutrition 1 -Group instruction provided by verbal, written material, interactive activities, discussions, models, and posters to present general guidelines for heart healthy nutrition including macronutrients, label reading, and promoting whole foods over processed counterparts. Education serves as pensions consultant of discussion of heart healthy eating for all. Written material provided at class time.    Education: Nutrition 2 -Group instruction provided by verbal, written material,  interactive activities, discussions, models, and posters to present general guidelines for heart healthy nutrition including sodium, cholesterol, and saturated fat. Providing guidance of habit forming to improve blood pressure, cholesterol, and body weight. Written material provided at class time.     Biometrics:    Nutrition Therapy Plan and Nutrition Goals:   Nutrition Assessments:  MEDIFICTS Score Key: >=70 Need to make dietary changes  40-70 Heart Healthy Diet <= 40 Therapeutic Level Cholesterol Diet  Flowsheet Row CARDIAC REHAB PHASE II ORIENTATION from 10/31/2023 in [facility] CARDIAC REHABILITATION  Picture Your Plate Total Score on Admission 60   Picture Your Plate Scores: <59 Unhealthy dietary pattern with much room for improvement. 41-50 Dietary pattern unlikely to meet recommendations for good health and room for improvement. 51-60 More healthful dietary pattern, with some room for improvement.  >60 Healthy dietary pattern, although there may be some specific behaviors that could be improved.    Nutrition Goals Re-Evaluation:  Nutrition Goals Re-Evaluation     Row Name 02/26/24 1531             Goals   Nutrition Goal healthy eating       Comment Nohea still is trying to watch her salt and sugar intake. She notes salt has been harder to cut back than sugar due to her being a salty person. She does not use the salt shaker much anymore. She loves sweet tea and we tallked about doing half and half tea to help cut back on some of the sugar.       Expected Outcome Short: try half and half tea    long: continue to cut salt and eating healthy diet          Nutrition Goals Discharge (Final Nutrition Goals Re-Evaluation):  Nutrition Goals Re-Evaluation - 02/26/24 1531       Goals   Nutrition Goal healthy eating    Comment Crystal still is trying to watch her salt and sugar intake. She notes salt has been harder to cut back than sugar due to her being a salty  person. She does  not use the salt shaker much anymore. She loves sweet tea and we tallked about doing half and half tea to help cut back on some of the sugar.    Expected Outcome Short: try half and half tea    long: continue to cut salt and eating healthy diet          Psychosocial: Target Goals: Acknowledge presence or absence of significant depression and/or stress, maximize coping skills, provide positive support system. Participant is able to verbalize types and ability to use techniques and skills needed for reducing stress and depression.   Education: Stress, Anxiety, and Depression - Group verbal and visual presentation to define topics covered.  Reviews how body is impacted by stress, anxiety, and depression.  Also discusses healthy ways to reduce stress and to treat/manage anxiety and depression. Written material provided at class time.   Education: Sleep Hygiene -Provides group verbal and written instruction about how sleep can affect your health.  Define sleep hygiene, discuss sleep cycles and impact of sleep habits. Review good sleep hygiene tips.   Initial Review & Psychosocial Screening:   Quality of Life Scores:   Scores of 19 and below usually indicate a poorer quality of life in these areas.  A difference of  2-3 points is a clinically meaningful difference.  A difference of 2-3 points in the total score of the Quality of Life Index has been associated with significant improvement in overall quality of life, self-image, physical symptoms, and general health in studies assessing change in quality of life.  PHQ-9: Review Flowsheet  More data exists      01/08/2024 01/03/2024 12/31/2023 11/27/2023 10/31/2023  Depression screen PHQ 2/9  Decreased Interest 0 0 0 1 1  Down, Depressed, Hopeless 0 0 0 1 1  PHQ - 2 Score 0 0 0 2 2  Altered sleeping 0 0 2 1 0  Tired, decreased energy 0 0 0 2 3  Change in appetite 0 0 0 0 0  Feeling bad or failure about yourself  0 0 2 1 1    Trouble concentrating 0 0 2 1 1   Moving slowly or fidgety/restless 0 0 0 0 0  Suicidal thoughts 0 0 0 0 0  PHQ-9 Score 0  0  6  7  7    Difficult doing work/chores Not difficult at all Not difficult at all Somewhat difficult Somewhat difficult Somewhat difficult    Details       Data saved with a previous flowsheet row definition        Interpretation of Total Score  Total Score Depression Severity:  1-4 = Minimal depression, 5-9 = Mild depression, 10-14 = Moderate depression, 15-19 = Moderately severe depression, 20-27 = Severe depression   Psychosocial Evaluation and Intervention:   Psychosocial Re-Evaluation:  Psychosocial Re-Evaluation     Row Name 02/26/24 1529             Psychosocial Re-Evaluation   Current issues with Current Stress Concerns;Current Sleep Concerns       Comments Maniya is doing well in rehab. She has a lot of stress right now due to his sister not doing well. She stated that she hates to answer the phone now incrase that is someone telling her her sister passed. She has not been sleeping well either due to the stress asn worrying       Expected Outcomes Short: find outlet to help with stress and sleep    long: continue to exericse for overall wellbeing  Interventions Encouraged to attend Cardiac Rehabilitation for the exercise       Continue Psychosocial Services  Follow up required by staff          Psychosocial Discharge (Final Psychosocial Re-Evaluation):  Psychosocial Re-Evaluation - 02/26/24 1529       Psychosocial Re-Evaluation   Current issues with Current Stress Concerns;Current Sleep Concerns    Comments Alynna is doing well in rehab. She has a lot of stress right now due to his sister not doing well. She stated that she hates to answer the phone now incrase that is someone telling her her sister passed. She has not been sleeping well either due to the stress asn worrying    Expected Outcomes Short: find outlet to help with stress  and sleep    long: continue to exericse for overall wellbeing    Interventions Encouraged to attend Cardiac Rehabilitation for the exercise    Continue Psychosocial Services  Follow up required by staff          Vocational Rehabilitation: Provide vocational rehab assistance to qualifying candidates.   Vocational Rehab Evaluation & Intervention:   Education: Education Goals: Education classes will be provided on a variety of topics geared toward better understanding of heart health and risk factor modification. Participant will state understanding/return demonstration of topics presented as noted by education test scores.  Learning Barriers/Preferences:   General Cardiac Education Topics:  AED/CPR: - Group verbal and written instruction with the use of models to demonstrate the basic use of the AED with the basic ABC's of resuscitation.   Test and Procedures: - Group verbal and visual presentation and models provide information about basic cardiac anatomy and function. Reviews the testing methods done to diagnose heart disease and the outcomes of the test results. Describes the treatment choices: Medical Management, Angioplasty, or Coronary Bypass Surgery for treating various heart conditions including Myocardial Infarction, Angina, Valve Disease, and Cardiac Arrhythmias. Written material provided at class time. Flowsheet Row CARDIAC REHAB PHASE II EXERCISE from 02/21/2024 in [place] CARDIAC REHABILITATION  Date 02/21/24  Educator [facility]  Instruction Review Code 1- Verbalizes Understanding    Medication Safety: - Group verbal and visual instruction to review commonly prescribed medications for heart and lung disease. Reviews the medication, class of the drug, and side effects. Includes the steps to properly store meds and maintain the prescription regimen. Written material provided at class time.   Intimacy: - Group verbal instruction through game format to discuss how heart and  lung disease can affect sexual intimacy. Written material provided at class time.   Know Your Numbers and Heart Failure: - Group verbal and visual instruction to discuss disease risk factors for cardiac and pulmonary disease and treatment options.  Reviews associated critical values for Overweight/Obesity, Hypertension, Cholesterol, and Diabetes.  Discusses basics of heart failure: signs/symptoms and treatments.  Introduces Heart Failure Zone chart for action plan for heart failure. Written material provided at class time.   Infection Prevention: - Provides verbal and written material to individual with discussion of infection control including proper hand washing and proper equipment cleaning during exercise session.   Falls Prevention: - Provides verbal and written material to individual with discussion of falls prevention and safety.   Other: -Provides group and verbal instruction on various topics (see comments)   Knowledge Questionnaire Score:   Core Components/Risk Factors/Patient Goals at Admission:   Education:Diabetes - Individual verbal and written instruction to review signs/symptoms of diabetes, desired ranges of glucose level fasting, after  meals and with exercise. Acknowledge that pre and post exercise glucose checks will be done for 3 sessions at entry of program.   Core Components/Risk Factors/Patient Goals Review:   Goals and Risk Factor Review     Row Name 02/26/24 1534             Core Components/Risk Factors/Patient Goals Review   Personal Goals Review Improve shortness of breath with ADL's;Develop more efficient breathing techniques such as purse lipped breathing and diaphragmatic breathing and practicing self-pacing with activity.;Hypertension;Diabetes       Review Suvi is taking her medications as percribesd. She is dealing with her sister not dooing well and this has been very stressful for her,       Expected Outcomes Short: start checking her b/p  and blood glucose daily at home. Long: continue to follow up with her PCP and follow medication prescriptions.          Core Components/Risk Factors/Patient Goals at Discharge (Final Review):   Goals and Risk Factor Review - 02/26/24 1534       Core Components/Risk Factors/Patient Goals Review   Personal Goals Review Improve shortness of breath with ADL's;Develop more efficient breathing techniques such as purse lipped breathing and diaphragmatic breathing and practicing self-pacing with activity.;Hypertension;Diabetes    Review Jazmarie is taking her medications as percribesd. She is dealing with her sister not dooing well and this has been very stressful for her,    Expected Outcomes Short: start checking her b/p and blood glucose daily at home. Long: continue to follow up with her PCP and follow medication prescriptions.          ITP Comments:  ITP Comments     Row Name 03/11/24 1134 04/08/24 1650         ITP Comments 30 day review completed. ITP sent to Dr. Dorn Ross, Medical Director of Cardiac Rehab. Continue with ITP unless changes are made by physician. 30 day review completed. ITP sent to Dr. Dorn Ross, Medical Director of Cardiac Rehab. Continue with ITP unless changes are made by physician.  She has not attended since 02/26/24, letter sent for no return contact.         Comments: Patient discharged due to lack of attendance. Letter sent with no response.     [1]  Current Outpatient Medications:    acetaminophen  (TYLENOL ) 500 MG tablet, Take 1,000 mg by mouth every 6 (six) hours as needed for moderate pain or headache., Disp: , Rfl:    albuterol  (PROVENTIL ) (2.5 MG/3ML) 0.083% nebulizer solution, Take 3 mLs (2.5 mg total) by nebulization every 6 (six) hours as needed for wheezing or shortness of breath., Disp: 150 mL, Rfl: 1   albuterol  (VENTOLIN  HFA) 108 (90 Base) MCG/ACT inhaler, Inhale 2 puffs into the lungs every 6 (six) hours as needed for wheezing or  shortness of breath., Disp: 18 g, Rfl: 0   alendronate  (FOSAMAX ) 70 MG tablet, Take 1 tablet (70 mg total) by mouth every 7 (seven) days. Take with a full glass of water  on an empty stomach., Disp: 12 tablet, Rfl: 3   amLODipine  (NORVASC ) 10 MG tablet, Take 1 tablet (10 mg total) by mouth daily., Disp: 90 tablet, Rfl: 2   apixaban  (ELIQUIS ) 5 MG TABS tablet, Take 1 tablet (5 mg total) by mouth 2 (two) times daily., Disp: 60 tablet, Rfl: 5   benzonatate  (TESSALON ) 200 MG capsule, Take 1 capsule (200 mg total) by mouth 2 (two) times daily as needed for cough., Disp:  20 capsule, Rfl: 0   bisoprolol  (ZEBETA ) 5 MG tablet, Take 1 tablet (5 mg total) by mouth daily., Disp: 90 tablet, Rfl: 3   clopidogrel  (PLAVIX ) 75 MG tablet, Take 1 tablet (75 mg total) by mouth daily with breakfast., Disp: 30 tablet, Rfl: 5   dapagliflozin  propanediol (FARXIGA ) 10 MG TABS tablet, Take 1 tablet (10 mg total) by mouth daily. (Patient not taking: Reported on 04/04/2024), Disp: 90 tablet, Rfl: 4   doxycycline  (ADOXA) 100 MG tablet, Take 1 tablet (100 mg total) by mouth 2 (two) times daily., Disp: 20 tablet, Rfl: 0   erythromycin  ophthalmic ointment, Place 1 Application into the right eye at bedtime., Disp: 3.5 g, Rfl: 0   furosemide  (LASIX ) 20 MG tablet, Take 1 tablet (20 mg total) by mouth daily., Disp: 90 tablet, Rfl: 1   glucose blood (ONETOUCH VERIO) test strip, Use as instructed, Disp: 100 each, Rfl: 12   isosorbide  mononitrate (IMDUR ) 120 MG 24 hr tablet, Take 1 tablet (120 mg total) by mouth daily., Disp: 90 tablet, Rfl: 1   nitroGLYCERIN  (NITROSTAT ) 0.4 MG SL tablet, Place 1 tablet (0.4 mg total) under the tongue every 5 (five) minutes as needed for chest pain., Disp: 25 tablet, Rfl: 2   omeprazole  (PRILOSEC) 20 MG capsule, Take 1 capsule (20 mg total) by mouth daily., Disp: 90 capsule, Rfl: 1   ONETOUCH DELICA LANCETS FINE MISC, Use to check BG once daily, Disp: 100 each, Rfl: 2   predniSONE  (STERAPRED UNI-PAK 21  TAB) 10 MG (21) TBPK tablet, Use as directed, Disp: 21 tablet, Rfl: 0   rosuvastatin  (CRESTOR ) 40 MG tablet, Take 1 tablet (40 mg total) by mouth daily., Disp: 30 tablet, Rfl: 11   sacubitril -valsartan  (ENTRESTO ) 97-103 MG, Take 1 tablet by mouth 2 (two) times daily., Disp: 60 tablet, Rfl: 11   spironolactone  (ALDACTONE ) 25 MG tablet, Take 1 tablet (25 mg total) by mouth daily., Disp: 30 tablet, Rfl: 6   triamcinolone  ointment (KENALOG ) 0.5 %, Apply 1 Application topically 2 (two) times daily., Disp: 60 g, Rfl: 0 [2]  Social History Tobacco Use  Smoking Status Never  Smokeless Tobacco Never

## 2024-04-17 NOTE — Progress Notes (Signed)
 Discharge Progress Report  Patient Details  Name: [name] MRN: [id number] Date of Birth: [birth year] Referring Provider:   Flowsheet Row CARDIAC REHAB PHASE II ORIENTATION from 10/31/2023 in [facility] CARDIAC REHABILITATION  Referring Provider Lavona Agent MD     Number of Visits: 21  Reason for Discharge:  Early Exit:  Lack of attendance  Smoking History:  Tobacco Use History[1]  Diagnosis:  No diagnosis found.  ADL UCSD:   Initial Exercise Prescription:   Discharge Exercise Prescription (Final Exercise Prescription Changes):  Exercise Prescription Changes - 02/26/24 1500       Response to Exercise   Blood Pressure (Admit) 110/60    Blood Pressure (Exit) 108/60    Heart Rate (Admit) 90 bpm    Heart Rate (Exercise) 100 bpm    Heart Rate (Exit) 54 bpm    Rating of Perceived Exertion (Exercise) 14    Duration Continue with 30 min of aerobic exercise without signs/symptoms of physical distress.    Intensity THRR unchanged      Progression   Progression Continue to progress workloads to maintain intensity without signs/symptoms of physical distress.      Resistance Training   Training Prescription Yes    Weight 3    Reps 10-15      Treadmill   MPH 1.2    Grade 0    Minutes 15    METs 1.92      NuStep   Level 1    SPM 65    Minutes 15    METs 1.6          Functional Capacity:   Psychological, QOL, Others - Outcomes: PHQ 2/9:    01/08/2024    8:31 AM 01/03/2024   11:58 AM 12/31/2023    3:00 PM 11/27/2023    1:30 PM 10/31/2023    2:16 PM  Depression screen PHQ 2/9  Decreased Interest 0 0 0 1 1  Down, Depressed, Hopeless 0 0 0 1 1  PHQ - 2 Score 0 0 0 2 2  Altered sleeping 0 0 2 1 0  Tired, decreased energy 0 0 0 2 3  Change in appetite 0 0 0 0 0  Feeling bad or failure about yourself  0 0 2 1 1   Trouble concentrating 0 0 2 1 1   Moving slowly or fidgety/restless 0 0 0 0 0  Suicidal thoughts 0 0 0 0 0  PHQ-9 Score 0  0  6  7  7     Difficult doing work/chores Not difficult at all Not difficult at all Somewhat difficult Somewhat difficult Somewhat difficult     Data saved with a previous flowsheet row definition             [1]  Social History Tobacco Use  Smoking Status Never  Smokeless Tobacco Never

## 2024-04-21 ENCOUNTER — Other Ambulatory Visit (HOSPITAL_COMMUNITY): Payer: Self-pay

## 2024-04-22 ENCOUNTER — Ambulatory Visit (HOSPITAL_COMMUNITY)

## 2024-04-23 ENCOUNTER — Other Ambulatory Visit: Payer: Self-pay | Admitting: Family

## 2024-04-23 DIAGNOSIS — B9689 Other specified bacterial agents as the cause of diseases classified elsewhere: Secondary | ICD-10-CM

## 2024-04-24 ENCOUNTER — Ambulatory Visit (HOSPITAL_COMMUNITY)

## 2024-04-29 ENCOUNTER — Ambulatory Visit (HOSPITAL_COMMUNITY)

## 2024-05-01 ENCOUNTER — Ambulatory Visit (HOSPITAL_COMMUNITY)

## 2025-01-08 ENCOUNTER — Ambulatory Visit: Payer: Self-pay
